# Patient Record
Sex: Female | Born: 1960 | Race: White | Hispanic: No | Marital: Married | Smoking: Former smoker
Health system: Southern US, Community
[De-identification: ages and names within clinical notes are randomized; demographics above are authoritative.]

## PROBLEM LIST (undated history)

## (undated) DIAGNOSIS — Z992 Dependence on renal dialysis: Secondary | ICD-10-CM

## (undated) DIAGNOSIS — IMO0001 Reserved for inherently not codable concepts without codable children: Secondary | ICD-10-CM

## (undated) DIAGNOSIS — C801 Malignant (primary) neoplasm, unspecified: Secondary | ICD-10-CM

## (undated) DIAGNOSIS — D631 Anemia in chronic kidney disease: Secondary | ICD-10-CM

## (undated) DIAGNOSIS — N186 End stage renal disease: Secondary | ICD-10-CM

## (undated) DIAGNOSIS — K279 Peptic ulcer, site unspecified, unspecified as acute or chronic, without hemorrhage or perforation: Secondary | ICD-10-CM

## (undated) DIAGNOSIS — H3093 Unspecified chorioretinal inflammation, bilateral: Secondary | ICD-10-CM

## (undated) DIAGNOSIS — N189 Chronic kidney disease, unspecified: Secondary | ICD-10-CM

## (undated) DIAGNOSIS — E785 Hyperlipidemia, unspecified: Secondary | ICD-10-CM

## (undated) DIAGNOSIS — G43909 Migraine, unspecified, not intractable, without status migrainosus: Secondary | ICD-10-CM

## (undated) DIAGNOSIS — Z531 Procedure and treatment not carried out because of patient's decision for reasons of belief and group pressure: Secondary | ICD-10-CM

## (undated) DIAGNOSIS — E119 Type 2 diabetes mellitus without complications: Secondary | ICD-10-CM

## (undated) DIAGNOSIS — I209 Angina pectoris, unspecified: Secondary | ICD-10-CM

## (undated) DIAGNOSIS — G629 Polyneuropathy, unspecified: Secondary | ICD-10-CM

## (undated) DIAGNOSIS — E139 Other specified diabetes mellitus without complications: Secondary | ICD-10-CM

## (undated) DIAGNOSIS — Z7902 Long term (current) use of antithrombotics/antiplatelets: Secondary | ICD-10-CM

## (undated) DIAGNOSIS — F32A Depression, unspecified: Secondary | ICD-10-CM

## (undated) DIAGNOSIS — K219 Gastro-esophageal reflux disease without esophagitis: Secondary | ICD-10-CM

## (undated) DIAGNOSIS — B958 Unspecified staphylococcus as the cause of diseases classified elsewhere: Secondary | ICD-10-CM

## (undated) DIAGNOSIS — H4042X3 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, left eye, severe stage: Secondary | ICD-10-CM

## (undated) DIAGNOSIS — E11610 Type 2 diabetes mellitus with diabetic neuropathic arthropathy: Secondary | ICD-10-CM

## (undated) DIAGNOSIS — I10 Essential (primary) hypertension: Secondary | ICD-10-CM

## (undated) HISTORY — PX: COLONOSCOPY W/ POLYPECTOMY: SHX1380

## (undated) HISTORY — DX: Migraine, unspecified, not intractable, without status migrainosus: G43.909

## (undated) HISTORY — PX: ABDOMINAL HYSTERECTOMY: SHX81

## (undated) HISTORY — PX: HEMORRHOID SURGERY: SHX153

---

## 2007-12-13 ENCOUNTER — Ambulatory Visit: Payer: Self-pay | Admitting: Unknown Physician Specialty

## 2010-10-09 ENCOUNTER — Encounter: Payer: Self-pay | Admitting: Family Medicine

## 2010-10-09 ENCOUNTER — Ambulatory Visit: Payer: BC Managed Care – PPO | Admitting: Family Medicine

## 2010-10-09 DIAGNOSIS — Z23 Encounter for immunization: Secondary | ICD-10-CM

## 2010-10-16 NOTE — Assessment & Plan Note (Signed)
Summary: FLU SHOT/EVM  Nurse Visit   Immunizations Administered:  Influenza Vaccine # 1:    Vaccine Type: Fluzone    Site: right deltoid    Mfr: Sanofi Pasteur    Dose: 0.5 ml    Route: IM    Given by: Irwin Brakeman MD    Exp. Date: 02/22/2011    Lot #: [id number]    VIS given: 03/19/10 version given October 09, 2010.  Flu Vaccine Consent Questions:    Do you have a history of severe allergic reactions to this vaccine? no    Any prior history of allergic reactions to egg and/or gelatin? no    Do you have a sensitivity to the preservative Thimersol? no    Do you have a past history of Guillan-Barre Syndrome? no    Do you currently have an acute febrile illness? no    Have you ever had a severe reaction to latex? no    Vaccine information given and explained to patient? yes    Are you currently pregnant? no

## 2011-04-24 ENCOUNTER — Ambulatory Visit: Payer: Self-pay | Admitting: Unknown Physician Specialty

## 2011-08-10 ENCOUNTER — Ambulatory Visit: Payer: Self-pay | Admitting: Internal Medicine

## 2012-06-27 ENCOUNTER — Ambulatory Visit: Payer: Self-pay | Admitting: Internal Medicine

## 2012-07-09 DIAGNOSIS — M2040 Other hammer toe(s) (acquired), unspecified foot: Secondary | ICD-10-CM | POA: Insufficient documentation

## 2012-07-09 DIAGNOSIS — L84 Corns and callosities: Secondary | ICD-10-CM | POA: Insufficient documentation

## 2012-10-13 ENCOUNTER — Ambulatory Visit: Payer: Self-pay | Admitting: Obstetrics & Gynecology

## 2012-12-30 DIAGNOSIS — E559 Vitamin D deficiency, unspecified: Secondary | ICD-10-CM | POA: Insufficient documentation

## 2013-03-09 DIAGNOSIS — H3093 Unspecified chorioretinal inflammation, bilateral: Secondary | ICD-10-CM | POA: Insufficient documentation

## 2013-10-03 DIAGNOSIS — E785 Hyperlipidemia, unspecified: Secondary | ICD-10-CM | POA: Insufficient documentation

## 2013-10-06 LAB — HM COLONOSCOPY

## 2014-02-06 DIAGNOSIS — F329 Major depressive disorder, single episode, unspecified: Secondary | ICD-10-CM | POA: Insufficient documentation

## 2014-02-06 DIAGNOSIS — F32A Depression, unspecified: Secondary | ICD-10-CM | POA: Insufficient documentation

## 2014-05-10 DIAGNOSIS — H6980 Other specified disorders of Eustachian tube, unspecified ear: Secondary | ICD-10-CM | POA: Insufficient documentation

## 2014-06-28 DIAGNOSIS — E1042 Type 1 diabetes mellitus with diabetic polyneuropathy: Secondary | ICD-10-CM | POA: Insufficient documentation

## 2014-11-02 DIAGNOSIS — M26629 Arthralgia of temporomandibular joint, unspecified side: Secondary | ICD-10-CM | POA: Insufficient documentation

## 2014-11-02 DIAGNOSIS — F331 Major depressive disorder, recurrent, moderate: Secondary | ICD-10-CM | POA: Insufficient documentation

## 2015-05-30 ENCOUNTER — Other Ambulatory Visit: Payer: Self-pay | Admitting: Obstetrics and Gynecology

## 2015-05-30 DIAGNOSIS — R928 Other abnormal and inconclusive findings on diagnostic imaging of breast: Secondary | ICD-10-CM

## 2015-06-05 ENCOUNTER — Inpatient Hospital Stay
Admission: RE | Admit: 2015-06-05 | Discharge: 2015-06-05 | Disposition: A | Discharge status: Home / Self Care | Payer: Self-pay | Source: Ambulatory Visit | Attending: *Deleted | Admitting: *Deleted

## 2015-06-05 ENCOUNTER — Other Ambulatory Visit: Payer: Self-pay | Admitting: *Deleted

## 2015-06-05 DIAGNOSIS — Z9289 Personal history of other medical treatment: Secondary | ICD-10-CM

## 2015-06-06 ENCOUNTER — Ambulatory Visit
Admission: RE | Admit: 2015-06-06 | Discharge: 2015-06-06 | Disposition: A | Discharge status: Home / Self Care | Payer: BLUE CROSS/BLUE SHIELD | Source: Ambulatory Visit | Attending: Obstetrics and Gynecology | Admitting: Obstetrics and Gynecology

## 2015-06-06 ENCOUNTER — Other Ambulatory Visit: Payer: Self-pay | Admitting: Obstetrics and Gynecology

## 2015-06-06 DIAGNOSIS — R928 Other abnormal and inconclusive findings on diagnostic imaging of breast: Secondary | ICD-10-CM

## 2015-06-06 DIAGNOSIS — N63 Unspecified lump in breast: Secondary | ICD-10-CM | POA: Insufficient documentation

## 2015-09-03 ENCOUNTER — Ambulatory Visit
Admission: EM | Admit: 2015-09-03 | Discharge: 2015-09-03 | Disposition: A | Discharge status: Home / Self Care | Payer: BLUE CROSS/BLUE SHIELD | Attending: Family Medicine | Admitting: Family Medicine

## 2015-09-03 DIAGNOSIS — L03115 Cellulitis of right lower limb: Secondary | ICD-10-CM

## 2015-09-03 HISTORY — DX: Type 2 diabetes mellitus without complications: E11.9

## 2015-09-03 HISTORY — DX: Unspecified staphylococcus as the cause of diseases classified elsewhere: B95.8

## 2015-09-03 MED ORDER — CEFAZOLIN SODIUM 1 G IJ SOLR
1.0000 g | Freq: Once | INTRAMUSCULAR | Status: AC
  Administered 2015-09-03: 1 g via INTRAMUSCULAR

## 2015-09-03 MED ORDER — SULFAMETHOXAZOLE-TRIMETHOPRIM 800-160 MG PO TABS: 1.0000 | ORAL_TABLET | Freq: Two times a day (BID) | ORAL | Status: DC

## 2015-09-03 NOTE — ED Provider Notes (Signed)
CSN: [id number]     Arrival date & time 09/03/15  [id number] History   First MD Initiated Contact with Patient 09/03/15 0913     Chief Complaint  Patient presents with  . Rash   (Consider location/radiation/quality/duration/timing/severity/associated sxs/prior Treatment) HPI Comments: 55 yo diabetic female with a 2 week h/o skin lesion that started to heal but now with 3-4 days h/o new skin lesion near the old one on the right shin area. States redness and tenderness seems to be worsening.Denies any fevers, chills.   Patient is a 55 y.o. female presenting with rash. The history is provided by the patient.  Rash   Past Medical History  Diagnosis Date  . Diabetes mellitus without complication ([place])   . Staph infection    Past Surgical History  Procedure Laterality Date  . Hemorrhoid surgery    . Colonoscopy w/ polypectomy    . Abdominal hysterectomy     Family History  Problem Relation Age of Onset  . Heart failure Mother   . Diabetes Mother    Social History  Substance Use Topics  . Smoking status: Former Research scientist (life sciences)  . Smokeless tobacco: None  . Alcohol Use: No   OB History    No data available     Review of Systems  Skin: Positive for rash.    Allergies  Penicillins  Home Medications   Prior to Admission medications   Medication Sig Start Date End Date Taking? Authorizing Provider  doxycycline (VIBRAMYCIN) 100 MG capsule Take 100 mg by mouth 2 (two) times daily.   Yes Historical Provider, MD  lisinopril (PRINIVIL,ZESTRIL) 2.5 MG tablet Take 2.5 mg by mouth daily.   Yes Historical Provider, MD  metFORMIN (GLUCOPHAGE) 1000 MG tablet Take 1,000 mg by mouth 2 (two) times daily with a meal.   Yes Historical Provider, MD  progesterone (PROMETRIUM) 100 MG capsule Take 100 mg by mouth daily.   Yes Historical Provider, MD  sertraline (ZOLOFT) 50 MG tablet Take 50 mg by mouth daily.   Yes Historical Provider, MD  sulfamethoxazole-trimethoprim (BACTRIM DS,SEPTRA DS) 800-160 MG  tablet Take 1 tablet by mouth 2 (two) times daily. 09/03/15   Norval Gable, MD   Meds Ordered and Administered this Visit   Medications  ceFAZolin (ANCEF) injection 1 g (1 g Intramuscular Given 09/03/15 0942)    BP 150/78 mmHg  Pulse 94  Temp(Src) 97.9 F (36.6 C) (Tympanic)  Resp 16  Ht 5\' 7"  (1.702 m)  Wt 155 lb (70.308 kg)  BMI 24.27 kg/m2  SpO2 100% No data found.   Physical Exam  Constitutional: She appears well-developed and well-nourished. No distress.  Skin: No rash noted. She is not diaphoretic. There is erythema.  Right lower extremity with 4x6 skin area of warmth, tenderness and blanchable erythema along with scabbed 2cm central area, on anterior shin; extremity neurovascularly intact; normal range of motion; no foot ulceration or skin lesions  Nursing note and vitals reviewed.   ED Course  Procedures (including critical care time)  Labs Review Labs Reviewed - No data to display  Imaging Review No results found.   Visual Acuity Review  Right Eye Distance:   Left Eye Distance:   Bilateral Distance:    Right Eye Near:   Left Eye Near:    Bilateral Near:         MDM   1. Cellulitis of right lower extremity    Discharge Medication List as of 09/03/2015  9:43 AM    START taking these  medications   Details  sulfamethoxazole-trimethoprim (BACTRIM DS,SEPTRA DS) 800-160 MG tablet Take 1 tablet by mouth 2 (two) times daily., Starting 09/03/2015, Until Discontinued, Normal       1. Labs/x-ray results and diagnosis reviewed with patient/parent/guardian/family 2. rx as per orders above; reviewed possible side effects, interactions, risks and benefits  3. Patient given Ancef 1gm IM x1 4. Recommend supportive treatment with rest, elevation and warm compresses to affected area 5. Follow-up with PCP (or here) in 2-3 days or sooner prn if symptoms worsen or are not improving    Norval Gable, MD 09/03/15 1025

## 2015-09-03 NOTE — ED Notes (Addendum)
States started 1-2 weeks ago with "bump" right outer lower leg. Dime sized necrotic center with redness around the wound. Also several smaller lesions bilateral arms. Been being treated by Dr. Marlane Hatcher for staph x 2 months

## 2015-09-03 NOTE — Discharge Instructions (Signed)

## 2015-09-06 ENCOUNTER — Encounter: Payer: Self-pay | Admitting: *Deleted

## 2015-09-06 ENCOUNTER — Telehealth: Payer: Self-pay | Admitting: Emergency Medicine

## 2015-09-06 ENCOUNTER — Emergency Department
Admission: EM | Admit: 2015-09-06 | Discharge: 2015-09-06 | Discharge status: Against Medical Advice | Payer: BLUE CROSS/BLUE SHIELD | Attending: Emergency Medicine | Admitting: Emergency Medicine

## 2015-09-06 DIAGNOSIS — E119 Type 2 diabetes mellitus without complications: Secondary | ICD-10-CM | POA: Insufficient documentation

## 2015-09-06 DIAGNOSIS — Z87891 Personal history of nicotine dependence: Secondary | ICD-10-CM | POA: Insufficient documentation

## 2015-09-06 DIAGNOSIS — L02415 Cutaneous abscess of right lower limb: Secondary | ICD-10-CM | POA: Insufficient documentation

## 2015-09-06 LAB — CBC WITH DIFFERENTIAL/PLATELET
Basophils Absolute: 0 10*3/uL (ref 0–0.1)
Basophils Relative: 1 %
EOS PCT: 2 %
Eosinophils Absolute: 0.1 10*3/uL (ref 0–0.7)
HCT: 37.7 % (ref 35.0–47.0)
Hemoglobin: 12.9 g/dL (ref 12.0–16.0)
LYMPHS ABS: 1.9 10*3/uL (ref 1.0–3.6)
LYMPHS PCT: 27 %
MCH: 28.8 pg (ref 26.0–34.0)
MCHC: 34.2 g/dL (ref 32.0–36.0)
MCV: 84.4 fL (ref 80.0–100.0)
MONO ABS: 0.6 10*3/uL (ref 0.2–0.9)
Monocytes Relative: 9 %
NEUTROS ABS: 4.4 10*3/uL (ref 1.4–6.5)
Neutrophils Relative %: 61 %
PLATELETS: 289 10*3/uL (ref 150–440)
RBC: 4.47 MIL/uL (ref 3.80–5.20)
RDW: 13 % (ref 11.5–14.5)
WBC: 7.1 10*3/uL (ref 3.6–11.0)

## 2015-09-06 LAB — BASIC METABOLIC PANEL
Anion gap: 6 (ref 5–15)
BUN: 24 mg/dL — AB (ref 6–20)
CALCIUM: 9.4 mg/dL (ref 8.9–10.3)
CO2: 29 mmol/L (ref 22–32)
Chloride: 102 mmol/L (ref 101–111)
Creatinine, Ser: 0.81 mg/dL (ref 0.44–1.00)
GFR calc Af Amer: >60 mL/min (ref >60)
GLUCOSE: 150 mg/dL — AB (ref 65–99)
POTASSIUM: 4.1 mmol/L (ref 3.5–5.1)
Sodium: 137 mmol/L (ref 135–145)

## 2015-09-06 NOTE — ED Notes (Signed)
Called patient due to lwot to inquire about condition and follow up plans. Left message with my number. 

## 2015-09-06 NOTE — ED Notes (Signed)
Pt has an abscess to right lower leg.  Pt is diabetic.  Area red and swollen.  No drainage.  Pt was seen at [facility] and started on septra and ancef im.  Pt states abscess not any better.

## 2015-09-20 ENCOUNTER — Encounter: Payer: BLUE CROSS/BLUE SHIELD | Attending: Surgery | Admitting: Surgery

## 2015-09-20 DIAGNOSIS — E11622 Type 2 diabetes mellitus with other skin ulcer: Secondary | ICD-10-CM | POA: Insufficient documentation

## 2015-09-20 DIAGNOSIS — Z79899 Other long term (current) drug therapy: Secondary | ICD-10-CM | POA: Diagnosis not present

## 2015-09-20 DIAGNOSIS — Z794 Long term (current) use of insulin: Secondary | ICD-10-CM | POA: Insufficient documentation

## 2015-09-20 DIAGNOSIS — Z880 Allergy status to penicillin: Secondary | ICD-10-CM | POA: Insufficient documentation

## 2015-09-20 DIAGNOSIS — Z7984 Long term (current) use of oral hypoglycemic drugs: Secondary | ICD-10-CM | POA: Insufficient documentation

## 2015-09-20 DIAGNOSIS — L97212 Non-pressure chronic ulcer of right calf with fat layer exposed: Secondary | ICD-10-CM | POA: Diagnosis not present

## 2015-09-20 DIAGNOSIS — E1140 Type 2 diabetes mellitus with diabetic neuropathy, unspecified: Secondary | ICD-10-CM | POA: Diagnosis not present

## 2015-09-20 DIAGNOSIS — R05 Cough: Secondary | ICD-10-CM | POA: Diagnosis not present

## 2015-09-20 NOTE — Progress Notes (Addendum)
AUNA, VEREEN ([id number]) Visit Report for 09/20/2015 Chief Complaint Document Details Patient Name: Kelli [name], Kelli [name] 09/20/2015 9:30 Date of Service: AM Medical Record [id number] Number: Patient Account Number: [id number] Nov 29, 1960 (55 y.o. Treating RN: Macarthur Critchley Date of Birth/Sex: Female) Other Clinician: Primary Care Physician: Treating Christin Fudge Referring Physician: Orvilla Fus Physician/Extender: Weeks in Treatment: 0 Information Obtained from: Patient Chief Complaint Patients presents for treatment of an open diabetic ulcer to the right lower leg near her cough for about 3 months Electronic Signature(s) Signed: 09/20/2015 11:45:12 AM By: Christin Fudge MD, FACS Entered By: Christin Fudge on 09/20/2015 11:45:12 Kelli [name] ([id number]) -------------------------------------------------------------------------------- Debridement Details Patient Name: Kelli [name] 09/20/2015 9:30 Date of Service: AM Medical Record [id number] Number: Patient Account Number: [id number] [birth year] (55 y.o. Treating RN: Macarthur Critchley Date of Birth/Sex: Female) Other Clinician: Primary Care Physician: Treating Catilyn Boggus Referring Physician: Orvilla Fus Physician/Extender: Weeks in Treatment: 0 Debridement Performed for Wound #1 Right,Lateral Lower Leg Assessment: Performed By: Physician Christin Fudge, MD Debridement: Debridement Pre-procedure Yes Verification/Time Out Taken: Start Time: 10:32 Pain Control: Lidocaine 4% Topical Solution Level: Skin/Subcutaneous Tissue Total Area Debrided (L x 1.5 (cm) x 1.5 (cm) = 2.25 (cm) W): Tissue and other Viable, Non-Viable, Fibrin/Slough, Subcutaneous material debrided: Instrument: Curette Bleeding: Minimum Hemostasis Achieved: Pressure End Time: 10:35 Procedural Pain: 0 Post Procedural Pain: 0 Response to Treatment: Procedure was tolerated well Post Debridement Measurements of Total Wound Length:  (cm) 1.5 Width: (cm) 1.5 Depth: (cm) 0.2 Volume: (cm) 0.353 Post Procedure Diagnosis Same as Pre-procedure Electronic Signature(s) Signed: 09/20/2015 11:44:51 AM By: Christin Fudge MD, FACS Signed: 09/20/2015 4:29:37 PM By: Rebecca Eaton RN, Sendra Entered By: Christin Fudge on 09/20/2015 11:44:51 Kelli [name] ([id number]) -------------------------------------------------------------------------------- HPI Details Patient Name: Kelli [name] 09/20/2015 9:30 Date of Service: AM Medical Record [id number] Number: Patient Account Number: [id number] [birth year] (55 y.o. Treating RN: Macarthur Critchley Date of Birth/Sex: Female) Other Clinician: Primary Care Physician: Treating Christin Fudge Referring Physician: Orvilla Fus Physician/Extender: Weeks in Treatment: 0 History of Present Illness Location: right lateral calf region Quality: Patient reports No Pain. Severity: Patient states wound are getting worse. Duration: Patient has had the wound for > 3 months prior to seeking treatment at the wound center Context: The wound appeared gradually over time Modifying Factors: Other treatment(s) tried include:she has seen a dermatologist and her PCP and has had 3 rounds of antibiotics in the last 3 months Associated Signs and Symptoms: Patient reports having increase swelling. HPI Description: 55 year old patient referred to as by her PCP Dr. Orvilla Fus, for a wound on her right lower calf which has been there for about 3 months. She has received antibiotics on 3 different occasions for this. Her past medical history is significant for diabetes mellitus type 2, depression and migraine. She has not had any surgery. She was known to be a smoker and has quit smoking 30 years ago. she was told she had a staph infection and this is possibly by a culture done by the dermatologist. I do not believe a skin biopsy has been done. Electronic Signature(s) Signed: 09/20/2015 11:47:09 AM By:  Christin Fudge MD, FACS Previous Signature: 09/20/2015 9:51:06 AM Version By: Christin Fudge MD, FACS Previous Signature: 09/20/2015 9:48:19 AM Version By: Christin Fudge MD, FACS Entered By: Christin Fudge on 09/20/2015 11:47:08 Kelli [name] ([id number]) -------------------------------------------------------------------------------- Physical Exam Details Patient Name: Kelli [name] 09/20/2015 9:30 Date of Service: AM Medical Record [id number] Number: Patient Account Number: [id number] Sep 28, 1960 (55 y.o. Treating RN: Rebecca Eaton,  Roslynn Amble Date of Birth/Sex: Female) Other Clinician: Primary Care Physician: Treating Christin Fudge Referring Physician: Orvilla Fus Physician/Extender: Weeks in Treatment: 0 Constitutional . Pulse regular. Respirations normal and unlabored. Afebrile. . Eyes Nonicteric. Reactive to light. Ears, Nose, Mouth, and Throat Lips, teeth, and gums WNL.Marland Kitchen Moist mucosa without lesions. Neck supple and nontender. No palpable supraclavicular or cervical adenopathy. Normal sized without goiter. Respiratory WNL. No retractions.. Cardiovascular Pedal Pulses WNL. ABI on the right is 1.05 and the left is 1.09. mild +1 pitting edema right lower extremity. Gastrointestinal (GI) Abdomen without masses or tenderness.. No liver or spleen enlargement or tenderness.. Lymphatic No adneopathy. No adenopathy. No adenopathy. Musculoskeletal Adexa without tenderness or enlargement.. Digits and nails w/o clubbing, cyanosis, infection, petechiae, ischemia, or inflammatory conditions.. Integumentary (Hair, Skin) No suspicious lesions. No crepitus or fluctuance. No peri-wound warmth or erythema. No masses.Marland Kitchen Psychiatric Judgement and insight Intact.. No evidence of depression, anxiety, or agitation.. Notes she has a circular ulceration on the right mid lateral calf which has necrotic debris and sharp dissection was done with a curette up to the subcutaneous tissue. The  surrounding skin shows lichenification possibly due to using abrasive cleaning agents Electronic Signature(s) Signed: 09/20/2015 11:48:39 AM By: Christin Fudge MD, FACS Entered By: Christin Fudge on 09/20/2015 11:48:38 Kelli [name] ([id number]) -------------------------------------------------------------------------------- Physician Orders Details Patient Name: DESRA, ELICH 09/20/2015 9:30 Date of Service: AM Medical Record [id number] Number: Patient Account Number: [id number] 02/13/1961 (55 y.o. Treating RN: Macarthur Critchley Date of Birth/Sex: Female) Other Clinician: Primary Care Physician: Treating Christin Fudge Referring Physician: Orvilla Fus Physician/Extender: Weeks in Treatment: 0 Verbal / Phone Orders: Yes Clinician: Macarthur Critchley Read Back and Verified: No Diagnosis Coding Wound Cleansing Wound #1 Right,Lateral Lower Leg o Cleanse wound with mild soap and water Primary Wound Dressing Wound #1 Right,Lateral Lower Leg o Santyl Ointment Secondary Dressing Wound #1 Right,Lateral Lower Leg o Gauze and Kerlix/Conform Dressing Change Frequency Wound #1 Right,Lateral Lower Leg o Change dressing every day. Follow-up Appointments Wound #1 Right,Lateral Lower Leg o Return Appointment in 1 week. Electronic Signature(s) Signed: 09/20/2015 4:27:41 PM By: Christin Fudge MD, FACS Signed: 09/20/2015 4:29:37 PM By: Rebecca Eaton RN, Romero Liner By: Rebecca Eaton RN, Sendra on 09/20/2015 10:37:36 LAURNA, PLAZA ([id number]) -------------------------------------------------------------------------------- Prescription 09/20/2015 Patient Name: Kelli [name] Physician: Christin Fudge MD Date of Birth: [birth year] NPI#: [id number] Sex: F DEA#: [id number] Phone #: [phone] License #: Patient Address: [address], [place] [address] [facility] [address] [place], [place] [address] [phone] Allergies penicillin Reaction: nausea, vomiting Physician's Orders Santyl Ointment Signature(s): Date(s): Electronic Signature(s) Signed: 09/20/2015 4:27:41 PM By: Christin Fudge MD, FACS Signed: 09/20/2015 4:29:37 PM By: Rebecca Eaton RN, Sendra Entered By: Rebecca Eaton RN, Sendra on 09/20/2015 10:37:36 Kelli [name] ([id number]) --------------------------------------------------------------------------------  Problem List Details Patient Name: SHANTORIA, DELMONICO 09/20/2015 9:30 Date of Service: AM Medical Record [id number] Number: Patient Account Number: [id number] 10-13-1960 (55 y.o. Treating RN: Macarthur Critchley Date of Birth/Sex: Female) Other Clinician: Primary Care Physician: Treating Christin Fudge Referring Physician: Orvilla Fus Physician/Extender: Weeks in Treatment: 0 Active Problems ICD-10 Encounter Code Description Active Date Diagnosis E11.622 Type 2 diabetes mellitus with other skin ulcer 09/20/2015 Yes L97.212 Non-pressure chronic ulcer of right calf with fat layer 09/20/2015 Yes exposed Inactive Problems Resolved Problems Electronic Signature(s) Signed: 09/20/2015 11:44:36 AM By: Christin Fudge MD, FACS Entered By: Christin Fudge on 09/20/2015 11:44:36 Kelli [name] ([id number]) -------------------------------------------------------------------------------- Progress Note Details Patient Name: Lynwood Dawley  G. 09/20/2015 9:30 Date of Service: AM Medical Record [id number] Number: Patient Account Number: [id number] 03-Jun-1961 (54 y.o. Treating RN: Macarthur Critchley Date of Birth/Sex: Female) Other Clinician: Primary Care Physician: Treating Christin Fudge Referring Physician: Orvilla Fus Physician/Extender: Weeks in Treatment: 0 Subjective Chief Complaint Information obtained from Patient Patients presents for treatment of an open diabetic ulcer to the right lower leg near her cough for about  3 months History of Present Illness (HPI) The following HPI elements were documented for the patient's wound: Location: right lateral calf region Quality: Patient reports No Pain. Severity: Patient states wound are getting worse. Duration: Patient has had the wound for > 3 months prior to seeking treatment at the wound center Context: The wound appeared gradually over time Modifying Factors: Other treatment(s) tried include:she has seen a dermatologist and her PCP and has had 3 rounds of antibiotics in the last 3 months Associated Signs and Symptoms: Patient reports having increase swelling. 55 year old patient referred to as by her PCP Dr. Orvilla Fus, for a wound on her right lower calf which has been there for about 3 months. She has received antibiotics on 3 different occasions for this. Her past medical history is significant for diabetes mellitus type 2, depression and migraine. She has not had any surgery. She was known to be a smoker and has quit smoking 30 years ago. she was told she had a staph infection and this is possibly by a culture done by the dermatologist. I do not believe a skin biopsy has been done. Wound History Patient presents with 1 open wound that has been present for approximately few months. Patient has been treating wound in the following manner: antibiotics, ointment. Laboratory tests have not been performed in the last month. Patient reportedly has not tested positive for an antibiotic resistant organism. Patient reportedly has not tested positive for osteomyelitis. Patient reportedly has had testing performed to evaluate circulation in the legs. Patient experiences the following problems associated with their wounds: staph infection. Patient History Information obtained from Patient. Allergies CASONDRA, VANDERHYDE ([id number]) penicillin (Reaction: nausea, vomiting) Family History Diabetes - Mother, Father, Maternal Grandparents, Heart Disease - Mother,  Tuberculosis - Father, No family history of Cancer, Hypertension, Kidney Disease, Lung Disease, Seizures, Stroke, Thyroid Problems. Social History Former smoker - 30 years ago quit, Marital Status - Married, Alcohol Use - Never, Drug Use - No History, Caffeine Use - Daily. Medical History Eyes Denies history of Cataracts, Glaucoma, Optic Neuritis Ear/Nose/Mouth/Throat Denies history of Chronic sinus problems/congestion, Middle ear problems Hematologic/Lymphatic Denies history of Anemia, Hemophilia, Human Immunodeficiency Virus, Lymphedema, Sickle Cell Disease Respiratory Denies history of Aspiration, Asthma, Chronic Obstructive Pulmonary Disease (COPD), Pneumothorax, Sleep Apnea, Tuberculosis Cardiovascular Denies history of Angina, Arrhythmia, Congestive Heart Failure, Coronary Artery Disease, Deep Vein Thrombosis, Hypertension, Hypotension, Myocardial Infarction, Peripheral Arterial Disease, Peripheral Venous Disease, Phlebitis, Vasculitis Endocrine Patient has history of Type II Diabetes Genitourinary Denies history of End Stage Renal Disease Immunological Denies history of Lupus Erythematosus, Raynaud s, Scleroderma Integumentary (Skin) Denies history of History of Burn, History of pressure wounds Musculoskeletal Denies history of Gout, Rheumatoid Arthritis, Osteoarthritis, Osteomyelitis Neurologic Patient has history of Neuropathy Denies history of Dementia, Quadriplegia, Paraplegia, Seizure Disorder Oncologic Denies history of Received Chemotherapy, Received Radiation Psychiatric Denies history of Anorexia/bulimia, Confinement Anxiety Patient is treated with Insulin, Oral Agents. Blood sugar is tested. Medical And Surgical History Notes Eyes laser surgery, retina surgery Endocrine Hemoglobin A1C 11 Seago, Ruthmary G. ([id number]) Review of Systems (ROS) Constitutional Symptoms (General Health)  overall doesn't feel good Ear/Nose/Mouth/Throat Denies complaints or  symptoms of Difficult clearing ears, Sinusitis. Hematologic/Lymphatic Denies complaints or symptoms of Bleeding / Clotting Disorders, Human Immunodeficiency Virus. Respiratory Complains or has symptoms of Chronic or frequent coughs. Denies complaints or symptoms of Shortness of Breath. Cardiovascular Denies complaints or symptoms of Chest pain, LE edema. Gastrointestinal Complains or has symptoms of Nausea. Denies complaints or symptoms of Frequent diarrhea, Vomiting. Endocrine Denies complaints or symptoms of Hepatitis, Thyroid disease, Polydypsia (Excessive Thirst). Immunological Denies complaints or symptoms of Hives, Itching. Integumentary (Skin) Complains or has symptoms of Wounds - 1 wound. Denies complaints or symptoms of Bleeding or bruising tendency, Breakdown, Swelling. Musculoskeletal Denies complaints or symptoms of Muscle Pain, Muscle Weakness. Neurologic Denies complaints or symptoms of Numbness/parasthesias, Focal/Weakness. Oncologic The patient has no complaints or symptoms. Psychiatric Complains or has symptoms of Anxiety. Medications Adult Low Dose Aspirin 81 mg tablet,delayed release oral tablet,delayed release (DR/EC) oral metformin 500 mg tablet oral twice a day tablet oral Fluarix Quad 2016-2017 (PF) 60 mcg (15 mcg x 4)/0.5 mL IM syringe intramuscular syringe intramuscular Novolog Flexpen 100 unit/mL subcutaneous subcutaneous 15u three times a day insulin pen subcutaneous fluticasone 50 mcg/actuation nasal spray,suspension nasal spray,suspension nasal Zoloft 100 mg tablet oral tablet oral mupirocin 2 % topical ointment topical ointment topical Estrace 0.01% (0.1 mg/gram) vaginal cream vaginal cream vaginal Fennimore, Adayah G. ([id number]) Objective Constitutional Pulse regular. Respirations normal and unlabored. Afebrile. Vitals Time Taken: 9:57 AM, Height: 67 in, Source: Stated, Weight: 160 lbs, Source: Stated, BMI: 25.1, Temperature: 97.5 F, Pulse: 88  bpm, Blood Pressure: 107/59 mmHg. Eyes Nonicteric. Reactive to light. Ears, Nose, Mouth, and Throat Lips, teeth, and gums WNL.Marland Kitchen Moist mucosa without lesions. Neck supple and nontender. No palpable supraclavicular or cervical adenopathy. Normal sized without goiter. Respiratory WNL. No retractions.. Cardiovascular Pedal Pulses WNL. ABI on the right is 1.05 and the left is 1.09. mild +1 pitting edema right lower extremity. Gastrointestinal (GI) Abdomen without masses or tenderness.. No liver or spleen enlargement or tenderness.. Lymphatic No adneopathy. No adenopathy. No adenopathy. Musculoskeletal Adexa without tenderness or enlargement.. Digits and nails w/o clubbing, cyanosis, infection, petechiae, ischemia, or inflammatory conditions.Marland Kitchen Psychiatric Judgement and insight Intact.. No evidence of depression, anxiety, or agitation.. General Notes: she has a circular ulceration on the right mid lateral calf which has necrotic debris and sharp dissection was done with a curette up to the subcutaneous tissue. The surrounding skin shows lichenification possibly due to using abrasive cleaning agents Integumentary (Hair, Skin) No suspicious lesions. No crepitus or fluctuance. No peri-wound warmth or erythema. No masses.. Wound #1 status is Open. Original cause of wound was Gradually Appeared. The wound is located on the Right,Lateral Lower Leg. The wound measures 1.5cm length x 1.5cm width x 0.1cm depth; 1.767cm^2 area and 0.177cm^3 volume. The wound is limited to skin breakdown. There is no tunneling or undermining noted. There is a small amount of drainage noted. The wound margin is flat and intact. There is small Crehan, Elyn G. ([id number]) (1-33%) red granulation within the wound bed. There is a large (67-100%) amount of necrotic tissue within the wound bed including Adherent Slough. The periwound skin appearance exhibited: Dry/Scaly. The periwound skin appearance did not exhibit: Callus,  Crepitus, Excoriation, Fluctuance, Friable, Induration, Localized Edema, Rash, Scarring, Maceration, Moist, Atrophie Blanche, Cyanosis, Ecchymosis, Hemosiderin Staining, Mottled, Pallor, Rubor, Erythema. Assessment Active Problems ICD-10 E11.622 - Type 2 diabetes mellitus with other skin ulcer L97.212 - Non-pressure chronic ulcer of right calf with fat layer exposed  55 year old diabetic with a chronic ulcer on the right lateral calf for 3 months has been treated with antibiotics and local care and has had this chronic wound growing worse. After debriding I have recommended Santyl ointment to be applied on a daily basis and have discussed with the possibility of a skin biopsy at a later stage if this does not improve appropriately. She will also need a venous duplex study if the problem persist. All her questions have been answered and she will see me back next week Procedures Wound #1 Wound #1 is a To be determined located on the Right,Lateral Lower Leg . There was a Skin/Subcutaneous Tissue Debridement [id number]) debridement with total area of 2.25 sq cm performed by Christin Fudge, MD. with the following instrument(s): Curette to remove Viable and Non-Viable tissue/material including Fibrin/Slough and Subcutaneous after achieving pain control using Lidocaine 4% Topical Solution. A time out was conducted prior to the start of the procedure. A Minimum amount of bleeding was controlled with Pressure. The procedure was tolerated well with a pain level of 0 throughout and a pain level of 0 following the procedure. Post Debridement Measurements: 1.5cm length x 1.5cm width x 0.2cm depth; 0.353cm^3 volume. Post procedure Diagnosis Wound #1: Same as Pre-Procedure LAJUANDA, YAFFE. ([id number]) Plan Wound Cleansing: Wound #1 Right,Lateral Lower Leg: Cleanse wound with mild soap and water Primary Wound Dressing: Wound #1 Right,Lateral Lower Leg: Santyl Ointment Secondary Dressing: Wound #1  Right,Lateral Lower Leg: Gauze and Kerlix/Conform Dressing Change Frequency: Wound #1 Right,Lateral Lower Leg: Change dressing every day. Follow-up Appointments: Wound #1 Right,Lateral Lower Leg: Return Appointment in 1 week. 55 year old diabetic with a chronic ulcer on the right lateral calf for 3 months has been treated with antibiotics and local care and has had this chronic wound growing worse. After debriding I have recommended Santyl ointment to be applied on a daily basis and have discussed with the possibility of a skin biopsy at a later stage if this does not improve appropriately. She will also need a venous duplex study if the problem persist. All her questions have been answered and she will see me back next week Electronic Signature(s) Signed: 09/21/2015 10:46:03 AM By: Christin Fudge MD, FACS Previous Signature: 09/20/2015 11:50:37 AM Version By: Christin Fudge MD, FACS Entered By: Christin Fudge on 09/21/2015 10:46:03 Kelli [name] ([id number]) -------------------------------------------------------------------------------- ROS/PFSH Details Patient Name: SERENE, BASTOW 09/20/2015 9:30 Date of Service: AM Medical Record [id number] Number: Patient Account Number: [id number] 1961-07-15 (55 y.o. Treating RN: Macarthur Critchley Date of Birth/Sex: Female) Other Clinician: Primary Care Physician: Treating Santiago Stenzel Referring Physician: Orvilla Fus Physician/Extender: Weeks in Treatment: 0 Information Obtained From Patient Wound History Do you currently have one or more open woundso Yes How many open wounds do you currently haveo 1 Approximately how long have you had your woundso few months How have you been treating your wound(s) until nowo antibiotics, ointment Has your wound(s) ever healed and then re-openedo No Have you had any lab work done in the past montho No Have you tested positive for an antibiotic resistant organism (MRSA, VRE)o No Have you tested  positive for osteomyelitis (bone infection)o No Have you had any tests for circulation on your legso Yes Who ordered the testo 2 years ago [facility] the [facility] Have you had other problems associated with your woundso Other: staph infection Ear/Nose/Mouth/Throat Complaints and Symptoms: Negative for: Difficult clearing ears; Sinusitis Medical History: Negative for: Chronic sinus problems/congestion; Middle ear problems Hematologic/Lymphatic Complaints and  Symptoms: Negative for: Bleeding / Clotting Disorders; Human Immunodeficiency Virus Medical History: Negative for: Anemia; Hemophilia; Human Immunodeficiency Virus; Lymphedema; Sickle Cell Disease Respiratory Complaints and Symptoms: Positive for: Chronic or frequent coughs Negative for: Shortness of Breath Medical History: Negative for: Aspiration; Asthma; Chronic Obstructive Pulmonary Disease (COPD); Pneumothorax; Sleep Graveline, Tamiah G. ([id number]) Apnea; Tuberculosis Cardiovascular Complaints and Symptoms: Negative for: Chest pain; LE edema Medical History: Negative for: Angina; Arrhythmia; Congestive Heart Failure; Coronary Artery Disease; Deep Vein Thrombosis; Hypertension; Hypotension; Myocardial Infarction; Peripheral Arterial Disease; Peripheral Venous Disease; Phlebitis; Vasculitis Gastrointestinal Complaints and Symptoms: Positive for: Nausea Negative for: Frequent diarrhea; Vomiting Endocrine Complaints and Symptoms: Negative for: Hepatitis; Thyroid disease; Polydypsia (Excessive Thirst) Medical History: Positive for: Type II Diabetes Past Medical History Notes: Hemoglobin A1C 11 Time with diabetes: since 1996 Treated with: Insulin, Oral agents Blood sugar tested every day: Yes Tested : 3x day Immunological Complaints and Symptoms: Negative for: Hives; Itching Medical History: Negative for: Lupus Erythematosus; Raynaudos; Scleroderma Integumentary (Skin) Complaints and Symptoms: Positive  for: Wounds - 1 wound Negative for: Bleeding or bruising tendency; Breakdown; Swelling Medical History: Negative for: History of Burn; History of pressure wounds Musculoskeletal Drotar, Lunah G. ([id number]) Complaints and Symptoms: Negative for: Muscle Pain; Muscle Weakness Medical History: Negative for: Gout; Rheumatoid Arthritis; Osteoarthritis; Osteomyelitis Neurologic Complaints and Symptoms: Negative for: Numbness/parasthesias; Focal/Weakness Medical History: Positive for: Neuropathy Negative for: Dementia; Quadriplegia; Paraplegia; Seizure Disorder Psychiatric Complaints and Symptoms: Positive for: Anxiety Medical History: Negative for: Anorexia/bulimia; Confinement Anxiety Constitutional Symptoms (General Health) Complaints and Symptoms: Review of System Notes: overall doesn't feel good Eyes Medical History: Negative for: Cataracts; Glaucoma; Optic Neuritis Past Medical History Notes: laser surgery, retina surgery Genitourinary Medical History: Negative for: End Stage Renal Disease Oncologic Complaints and Symptoms: No Complaints or Symptoms Medical History: Negative for: Received Chemotherapy; Received Radiation Immunizations Immunization Notes: MAYLANA, BERTOLUCCI ([id number]) up to date, shingles-flu and pneumonia vaccines current Family and Social History Cancer: No; Diabetes: Yes - Mother, Father, Maternal Grandparents; Heart Disease: Yes - Mother; Hypertension: No; Kidney Disease: No; Lung Disease: No; Seizures: No; Stroke: No; Thyroid Problems: No; Tuberculosis: Yes - Father; Former smoker - 30 years ago quit; Marital Status - Married; Alcohol Use: Never; Drug Use: No History; Caffeine Use: Daily; Financial Concerns: No; Food, Clothing or Shelter Needs: No; Support System Lacking: No; Transportation Concerns: No; Advanced Directives: No; Patient does not want information on Advanced Directives; Living Will: No; Medical Power of Attorney: No Physician  Affirmation I have reviewed and agree with the above information. Electronic Signature(s) Signed: 09/20/2015 10:24:41 AM By: Christin Fudge MD, FACS Signed: 09/20/2015 4:29:37 PM By: Rebecca Eaton RN, Sendra Entered By: Christin Fudge on 09/20/2015 10:24:39 Kelli [name] ([id number]) -------------------------------------------------------------------------------- SuperBill Details Patient Name: Kelli [name] Date of Service: 09/20/2015 Medical Record Number: [id number] Patient Account Number: [id number] Date of Birth/Sex: [birth year] (54 y.o. 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 Modifier: layer exposed Quantity: 1 Physician Procedures CPT4 Code: [id number] Description: [id number] -  WC PHYS LEVEL 4 - NEW PT ICD-10 Description Diagnosis E11.622 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

## 2015-09-21 NOTE — Progress Notes (Signed)
GINNETTE, BERARDINO ([id number]) Visit Report for 09/20/2015 Allergy List Details Patient Name: Kelli [name], Kelli [name]. Date of Service: 09/20/2015 9:30 AM Medical Record Number: [id number] Patient Account Number: [id number] Date of Birth/Sex: 1961/03/17 (55 y.o. Female) Treating RN: Macarthur Critchley Primary Care Physician: Other Clinician: Referring Physician: Orvilla Fus Treating Physician/Extender: Frann Rider in Treatment: 0 Allergies Active Allergies penicillin Reaction: nausea, vomiting Allergy Notes Electronic Signature(s) Signed: 09/20/2015 4:29:37 PM By: Rebecca Eaton RN, Sendra Entered By: Rebecca Eaton RN, Sendra on 09/20/2015 09:45:26 Kelli [name] ([id number]) -------------------------------------------------------------------------------- Arrival Information Details Patient Name: Kelli [name] Date of Service: 09/20/2015 9:30 AM Medical Record Number: [id number] Patient Account Number: [id number] Date of Birth/Sex: September 07, 1960 (55 y.o. Female) Treating RN: Macarthur Critchley Primary Care Physician: Other Clinician: Referring Physician: Orvilla Fus Treating Physician/Extender: Frann Rider in Treatment: 0 Visit Information Patient Arrived: Ambulatory Arrival Time: 09:41 Accompanied By: self Transfer Assistance: None Patient Identification Verified: Yes Secondary Verification Process Yes Completed: Patient Has Alerts: Yes Patient Alerts: 1/26 ABI (L)1.09 (R) 1.05 Electronic Signature(s) Signed: 09/20/2015 4:29:37 PM By: Rebecca Eaton, RN, Sendra Entered By: Rebecca Eaton RN, Sendra on 09/20/2015 10:09:25 Kelli [name] ([id number]) -------------------------------------------------------------------------------- Clinic Level of Care Assessment Details Patient Name: Kelli [name] Date of Service: 09/20/2015 9:30 AM Medical Record Number: [id number] Patient Account Number: [id number] Date of Birth/Sex: 1961/06/03 (55 y.o. Female) Treating RN: Macarthur Critchley Primary Care Physician: Other Clinician: Referring Physician: Orvilla Fus Treating Physician/Extender: Frann Rider in Treatment: 0 Clinic Level of Care Assessment Items TOOL 1 Quantity Score X - Use when EandM and Procedure is performed on INITIAL visit 1 0 ASSESSMENTS - Nursing Assessment / Reassessment X - General Physical Exam (combine w/ comprehensive assessment (listed just 1 20 below) when performed on new pt. evals) X - Comprehensive Assessment (HX, ROS, Risk Assessments, Wounds Hx, etc.) 1 25 ASSESSMENTS - Wound and Skin Assessment / Reassessment []  - Dermatologic / Skin Assessment (not related to wound area) 0 ASSESSMENTS - Ostomy and/or Continence Assessment and Care []  - Incontinence Assessment and Management 0 []  - Ostomy Care Assessment and Management (repouching, etc.) 0 PROCESS - Coordination of Care X - Simple Patient / Family Education for ongoing care 1 15 []  - Complex (extensive) Patient / Family Education for ongoing care 0 X - Staff obtains Programmer, systems, Records, Test Results / Process Orders 1 10 X - Staff telephones HHA, Nursing Homes / Clarify orders / etc 1 10 []  - Routine Transfer to another Facility (non-emergent condition) 0 []  - Routine Hospital Admission (non-emergent condition) 0 []  - New Admissions / Biomedical engineer / Ordering NPWT, Apligraf, etc. 0 []  - Emergency Hospital Admission (emergent condition) 0 PROCESS - Special Needs []  - Pediatric / Minor Patient Management 0 []  - Isolation Patient Management 0 [name], [name] ([id number]) []  - Hearing / Language / Visual special needs 0 []  - Assessment of Community assistance (transportation, D/C planning, etc.) 0 []  - Additional assistance / Altered mentation 0 []  - Support Surface(s) Assessment (bed, cushion, seat, etc.) 0 INTERVENTIONS - Miscellaneous []  - External ear exam 0 []  - Patient Transfer (multiple staff / Civil Service fast streamer / Similar devices) 0 []  - Simple Staple /  Suture removal (25 or less) 0 []  - Complex Staple / Suture removal (26 or more) 0 []  - Hypo/Hyperglycemic Management (do not check if billed separately) 0 X - Ankle / Brachial Index (ABI) - do not check if billed separately 1 15 Has the patient been seen at the hospital within the last three years:  Yes Total Score: 95 Level Of Care: New/Established - Level 3 Electronic Signature(s) Signed: 09/20/2015 4:29:37 PM By: Rebecca Eaton, RN, Sendra Entered By: Rebecca Eaton RN, Sendra on 09/20/2015 15:01:56 Kelli [name] ([id number]) -------------------------------------------------------------------------------- Encounter Discharge Information Details Patient Name: Kelli [name] Date of Service: 09/20/2015 9:30 AM Medical Record Number: [id number] Patient Account Number: [id number] Date of Birth/Sex: 11-Jun-1961 (55 y.o. Female) Treating RN: Macarthur Critchley Primary Care Physician: Other Clinician: Referring Physician: Orvilla Fus Treating Physician/Extender: Frann Rider in Treatment: 0 Encounter Discharge Information Items Schedule Follow-up Appointment: No Medication Reconciliation completed No and provided to Patient/Care [name]: Provided on Clinical Summary of Care: 09/20/2015 Form Type Recipient Paper Patient [facility] Electronic Signature(s) Signed: 09/20/2015 10:48:33 AM By: Ruthine Dose Entered By: Ruthine Dose on 09/20/2015 10:48:32 Kelli [name] ([id number]) -------------------------------------------------------------------------------- Lower Extremity Assessment Details Patient Name: Kelli [name] Date of Service: 09/20/2015 9:30 AM Medical Record Number: [id number] Patient Account Number: [id number] Date of Birth/Sex: [birth year] (55 y.o. Female) Treating RN: Macarthur Critchley Primary Care Physician: Other Clinician: Referring Physician: Orvilla Fus Treating Physician/Extender: Frann Rider in Treatment: 0 Edema Assessment Assessed: [Left: No]  [Right: No] Edema: [Left: Ye] [Right: s] Calf Left: Right: Point of Measurement: 32 cm From Medial Instep cm 37 cm Ankle Left: Right: Point of Measurement: 10 cm From Medial Instep cm 21 cm Vascular Assessment Pulses: Posterior Tibial Palpable: [Right:Yes] Dorsalis Pedis Palpable: [Right:Yes] Extremity colors, hair growth, and conditions: Extremity Color: [Right:Normal] Hair Growth on Extremity: [Right:Yes] Temperature of Extremity: [Right:Warm] Capillary Refill: [Right:< 3 seconds] Dependent Rubor: [Right:No] Blanched when Elevated: [Right:No] Lipodermatosclerosis: [Right:No] Blood Pressure: Brachial: [Left:110] [Right:110] Dorsalis Pedis: 120 [Left:Dorsalis Pedis: [id number] Ankle: Posterior Tibial: [Left:Posterior Tibial: 1.09] [Right:1.05] Toe Nail Assessment Left: Right: Thick: No Discolored: No Deformed: No Mccombie, [name] ([id number]) Improper Length and Hygiene: No Electronic Signature(s) Signed: 09/20/2015 4:29:37 PM By: Rebecca Eaton, RN, Sendra Entered By: Rebecca Eaton RN, Sendra on 09/20/2015 10:08:40 Kelli [name] ([id number]) -------------------------------------------------------------------------------- Multi Wound Chart Details Patient Name: Kelli [name] Date of Service: 09/20/2015 9:30 AM Medical Record Number: [id number] Patient Account Number: [id number] Date of Birth/Sex: [birth year] (55 y.o. Female) Treating RN: Macarthur Critchley Primary Care Physician: Other Clinician: Referring Physician: Orvilla Fus Treating Physician/Extender: Frann Rider in Treatment: 0 Vital Signs Height(in): 67 Pulse(bpm): 88 Weight(lbs): 160 Blood Pressure 107/59 (mmHg): Body Mass Index(BMI): 25 Temperature(F): 97.5 Respiratory Rate (breaths/min): Photos: [1:No Photos] [N/A:N/A] Wound Location: [1:Right Lower Leg - Lateral] [N/A:N/A] Wounding Event: [1:Gradually Appeared] [N/A:N/A] Primary Etiology: [1:To be determined] [N/A:N/A] Comorbid History:  [1:Type II Diabetes, Neuropathy] [N/A:N/A] Date Acquired: [1:05/26/2015] [N/A:N/A] Weeks of Treatment: [1:0] [N/A:N/A] Wound Status: [1:Open] [N/A:N/A] Measurements L x W x D 1.5x1.5x0.1 [N/A:N/A] (cm) Area (cm) : [1:1.767] [N/A:N/A] Volume (cm) : [1:0.177] [N/A:N/A] Classification: [1:Partial Thickness] [N/A:N/A] HBO Classification: [1:Grade 1] [N/A:N/A] Exudate Amount: [1:Small] [N/A:N/A] Wound Margin: [1:Flat and Intact] [N/A:N/A] Granulation Amount: [1:Small (1-33%)] [N/A:N/A] Granulation Quality: [1:Red] [N/A:N/A] Necrotic Amount: [1:Large (67-100%)] [N/A:N/A] Exposed Structures: [1:Fascia: No Fat: No Tendon: No Muscle: No Joint: No Bone: No Limited to Skin Breakdown] [N/A:N/A] Epithelialization: [1:None] [N/A:N/A] Periwound Skin Texture: [N/A:N/A] Edema: No Excoriation: No Induration: No Callus: No Crepitus: No Fluctuance: No Friable: No Rash: No Scarring: No Periwound Skin Dry/Scaly: Yes N/A N/A Moisture: Maceration: No Moist: No Periwound Skin Color: Atrophie Blanche: No N/A N/A Cyanosis: No Ecchymosis: No Erythema: No Hemosiderin Staining: No Mottled: No Pallor: No Rubor: No Tenderness on No N/A N/A Palpation: Wound Preparation: Ulcer Cleansing: N/A N/A Rinsed/Irrigated with Saline Topical Anesthetic Applied: Other: lidocaine 4% Treatment  Notes Electronic Signature(s) Signed: 09/20/2015 4:29:37 PM By: Rebecca Eaton RN, Romero Liner By: Rebecca Eaton RN, Sendra on 09/20/2015 10:13:47 Kelli [name] ([id number]) -------------------------------------------------------------------------------- [place] Details Patient Name: Kelli [name], Kelli [name] Date of Service: 09/20/2015 9:30 AM Medical Record Number: [id number] Patient Account Number: [id number] Date of Birth/Sex: 18-Dec-1960 (55 y.o. Female) Treating RN: Macarthur Critchley Primary Care Physician: Other Clinician: Referring Physician: Orvilla Fus Treating Physician/Extender: Frann Rider in Treatment: 0 Active Inactive Orientation to the Wound Care Program Nursing Diagnoses: Knowledge deficit related to the wound healing center program Goals: Patient/caregiver will verbalize understanding of the [place] Program Date Initiated: 09/20/2015 Goal Status: Active Interventions: Provide education on orientation to the wound center Notes: Wound/Skin Impairment Nursing Diagnoses: Knowledge deficit related to ulceration/compromised skin integrity Goals: Patient/caregiver will verbalize understanding of skin care regimen Date Initiated: 09/20/2015 Goal Status: Active Ulcer/skin breakdown will have a volume reduction of 30% by week 4 Date Initiated: 09/20/2015 Goal Status: Active Ulcer/skin breakdown will have a volume reduction of 50% by week 8 Date Initiated: 09/20/2015 Goal Status: Active Ulcer/skin breakdown will have a volume reduction of 80% by week 12 Date Initiated: 09/20/2015 Goal Status: Active Ulcer/skin breakdown will heal within 14 weeks Date Initiated: 09/20/2015 Goal Status: Active Kelli [name], Kelli [name] ([id number]) Interventions: Assess patient/caregiver ability to obtain necessary supplies Assess patient/caregiver ability to perform ulcer/skin care regimen upon admission and as needed Assess ulceration(s) every visit Provide education on ulcer and skin care Notes: Electronic Signature(s) Signed: 09/20/2015 4:29:37 PM By: Rebecca Eaton, RN, Sendra Entered By: Rebecca Eaton RN, Sendra on 09/20/2015 10:13:28 Kelli [name] ([id number]) -------------------------------------------------------------------------------- Pain Assessment Details Patient Name: Kelli [name] Date of Service: 09/20/2015 9:30 AM Medical Record Number: [id number] Patient Account Number: [id number] Date of Birth/Sex: Mar 25, 1961 (55 y.o. Female) Treating RN: Macarthur Critchley Primary Care Physician: Other Clinician: Referring Physician: Orvilla Fus Treating  Physician/Extender: Frann Rider in Treatment: 0 Active Problems Location of Pain Severity and Description of Pain Patient Has Paino Yes Site Locations Pain Location: Pain in Ulcers Rate the pain. Current Pain Level: 2 Character of Pain Describe the Pain: Tender Pain Management and Medication Current Pain Management: Electronic Signature(s) Signed: 09/20/2015 4:29:37 PM By: Rebecca Eaton, RN, Sendra Entered By: Rebecca Eaton RN, Sendra on 09/20/2015 09:42:37 Kelli [name] ([id number]) -------------------------------------------------------------------------------- Patient/Caregiver Education Details Patient Name: Kelli [name] Date of Service: 09/20/2015 9:30 AM Medical Record Number: [id number] Patient Account Number: [id number] Date of Birth/Gender: 07-03-1961 (55 y.o. Female) Treating RN: Macarthur Critchley Primary Care Physician: Other Clinician: Referring Physician: Orvilla Fus Treating Physician/Extender: Frann Rider in Treatment: 0 Education Assessment Education Provided To: Patient Education Topics Provided Welcome To [facility] [place]: Handouts: Welcome To [facility] Methods: Explain/Verbal Responses: State content correctly Wound/Skin Impairment: Handouts: Caring for Your Ulcer, Skin Care Do's and Dont's Methods: Explain/Verbal Responses: State content correctly Electronic Signature(s) Signed: 09/20/2015 4:29:37 PM By: Rebecca Eaton, RN, Sendra Entered By: Rebecca Eaton RN, Sendra on 09/20/2015 10:14:08 Kelli [name] ([id number]) -------------------------------------------------------------------------------- Wound Assessment Details Patient Name: Kelli [name] Date of Service: 09/20/2015 9:30 AM Medical Record Number: [id number] Patient Account Number: [id number] Date of Birth/Sex: 02/12/1961 (55 y.o. Female) Treating RN: Macarthur Critchley Primary Care Physician: Other Clinician: Referring Physician: Orvilla Fus Treating  Physician/Extender: Frann Rider in Treatment: 0 Wound Status Wound Number: 1 Primary Etiology: To be determined Wound Location: Right Lower Leg - Lateral Wound Status: Open Wounding Event: Gradually Appeared Comorbid History: Type II Diabetes, Neuropathy Date Acquired: 05/26/2015 Weeks Of Treatment: 0 Clustered Wound:  No Photos Wound Measurements Length: (cm) 1.5 Width: (cm) 1.5 Depth: (cm) 0.1 Area: (cm) 1.767 Volume: (cm) 0.177 % Reduction in Area: 0% % Reduction in Volume: 0% Epithelialization: None Tunneling: No Undermining: No Wound Description Classification: Partial Thickness Foul Odor A Diabetic Severity (Wagner): Grade 1 Wound Margin: Flat and Intact Exudate Amount: Small fter Cleansing: No Wound Bed Granulation Amount: Small (1-33%) Exposed Structure Granulation Quality: Red Fascia Exposed: No Necrotic Amount: Large (67-100%) Fat Layer Exposed: No Necrotic Quality: Adherent Slough Tendon Exposed: No Muscle Exposed: No Joint Exposed: No Manalo, [name] ([id number]) Bone Exposed: No Limited to Skin Breakdown Periwound Skin Texture Texture Color No Abnormalities Noted: No No Abnormalities Noted: No Callus: No Atrophie Blanche: No Crepitus: No Cyanosis: No Excoriation: No Ecchymosis: No Fluctuance: No Erythema: No Friable: No Hemosiderin Staining: No Induration: No Mottled: No Localized Edema: No Pallor: No Rash: No Rubor: No Scarring: No Moisture No Abnormalities Noted: No Dry / Scaly: Yes Maceration: No Moist: No Wound Preparation Ulcer Cleansing: Rinsed/Irrigated with Saline Topical Anesthetic Applied: Other: lidocaine 4%, Treatment Notes Wound #1 (Right, Lateral Lower Leg) 1. Cleansed with: Clean wound with Normal Saline 2. Anesthetic Topical Lidocaine 4% cream to wound bed prior to debridement 4. Dressing Applied: Santyl Ointment 5. Secondary Dressing Applied Gauze and Kerlix/Conform Electronic  Signature(s) Signed: 09/20/2015 4:29:37 PM By: Rebecca Eaton RN, Sendra Entered By: Rebecca Eaton RN, Sendra on 09/20/2015 12:25:56 Kelli [name] ([id number]) -------------------------------------------------------------------------------- [place] Details Patient Name: Kelli [name] Date of Service: 09/20/2015 9:30 AM Medical Record Number: [id number] Patient Account Number: [id number] Date of Birth/Sex: March 12, 1961 (55 y.o. Female) Treating RN: Macarthur Critchley Primary Care Physician: Other Clinician: Referring Physician: Orvilla Fus Treating Physician/Extender: Frann Rider in Treatment: 0 Vital Signs Time Taken: 09:57 Temperature (F): 97.5 Height (in): 67 Pulse (bpm): 88 Source: Stated Blood Pressure (mmHg): 107/59 Weight (lbs): 160 Reference Range: 80 - 120 mg / dl Source: Stated Body Mass Index (BMI): 25.1 Electronic Signature(s) Signed: 09/20/2015 4:29:37 PM By: Rebecca Eaton RN, Sendra Entered By: Rebecca Eaton RN, Sendra on 09/20/2015 09:57:53

## 2015-09-21 NOTE — Progress Notes (Signed)
Kelli [name], Kelli [name] ([id number]) Visit Report for 09/20/2015 Abuse/Suicide Risk Screen Details Patient Name: Kelli [name], Kelli [name] 09/20/2015 9:30 Date of Service: AM Medical Record [id number] Number: Patient Account Number: [id number] 1960/12/22 (55 y.o. Treating RN: Macarthur Critchley Date of Birth/Sex: Female) Other Clinician: Primary Care Physician: Treating Britto, Errol Referring Physician: Orvilla Fus Physician/Extender: Weeks in Treatment: 0 Abuse/Suicide Risk Screen Items Answer ABUSE/SUICIDE RISK SCREEN: Has anyone close to you tried to hurt or harm you recentlyo No Do you feel uncomfortable with anyone in your familyo No Has anyone forced you do things that you didnot want to doo No Do you have any thoughts of harming yourselfo No Patient displays signs or symptoms of abuse and/or neglect. No Electronic Signature(s) Signed: 09/20/2015 4:29:37 PM By: Rebecca Eaton RN, Sendra Entered By: Rebecca Eaton RN, Sendra on 09/20/2015 09:53:41 Kelli [name] ([id number]) -------------------------------------------------------------------------------- Activities of Daily Living Details Patient Name: Kelli [name], Kelli [name] 09/20/2015 9:30 Date of Service: AM Medical Record [id number] Number: Patient Account Number: [id number] July 07, 1961 (55 y.o. Treating RN: Macarthur Critchley Date of Birth/Sex: Female) Other Clinician: Primary Care Physician: Treating Christin Fudge Referring Physician: Orvilla Fus Physician/Extender: Weeks in Treatment: 0 Activities of Daily Living Items Answer Activities of Daily Living (Please select one for each item) Drive Automobile Completely Able Take Medications Completely Able Use Telephone Completely Able Care for Appearance Completely Able Use Toilet Completely Able Bath / Shower Completely Able Dress Self Completely Able Feed Self Completely Able Walk Completely Able Get In / Out Bed Completely Able Housework Completely Able Prepare Meals Completely  [place] for Self Completely Able Electronic Signature(s) Signed: 09/20/2015 4:29:37 PM By: Rebecca Eaton RN, Sendra Entered By: Rebecca Eaton RN, Sendra on 09/20/2015 09:53:51 Kelli [name] ([id number]) -------------------------------------------------------------------------------- Education Assessment Details Patient Name: Kelli [name] 09/20/2015 9:30 Date of Service: AM Medical Record [id number] Number: Patient Account Number: [id number] [birth year] (55 y.o. Treating RN: Macarthur Critchley Date of Birth/Sex: Female) Other Clinician: Primary Care Physician: Treating Christin Fudge Referring Physician: Orvilla Fus Physician/Extender: [place] in Treatment: 0 Primary Learner Assessed: Patient Learning Preferences/Education Level/Primary Language Learning Preference: Explanation Highest Education Level: Grade School Preferred Language: English Cognitive Barrier Assessment/Beliefs Language Barrier: No Translator Needed: No Memory Deficit: No Emotional Barrier: No Cultural/Religious Beliefs Affecting Medical No Care: Physical Barrier Assessment Impaired Vision: Yes Glasses Impaired Hearing: No Decreased Hand dexterity: No Knowledge/Comprehension Assessment Knowledge Level: High Comprehension Level: High Ability to understand written High instructions: Ability to understand verbal High instructions: Motivation Assessment Anxiety Level: Calm Cooperation: Cooperative Education Importance: Acknowledges Need Interest in Health Problems: Asks Questions Perception: Coherent Willingness to Engage in Self- High Management Activities: Readiness to Engage in Self- High Management Activities: Kelli [name], Kelli [name] ([id number]) Electronic Signature(s) Signed: 09/20/2015 4:29:37 PM By: Rebecca Eaton RN, Sendra Entered By: Rebecca Eaton RN, Sendra on 09/20/2015 09:54:20 Kelli [name]  ([id number]) -------------------------------------------------------------------------------- Fall Risk Assessment Details Patient Name: Kelli [name] 09/20/2015 9:30 Date of Service: AM Medical Record [id number] Number: Patient Account Number: [id number] 21-Mar-1961 (55 y.o. Treating RN: Macarthur Critchley Date of Birth/Sex: Female) Other Clinician: Primary Care Physician: Treating Christin Fudge Referring Physician: Orvilla Fus Physician/Extender: Weeks in Treatment: 0 Fall Risk Assessment Items Have you had 2 or more falls in the last 12 monthso 0 No Have you had any fall that resulted in injury in the last 12 monthso 0 No FALL RISK ASSESSMENT: History of falling - immediate or within 3 months 0 No Secondary diagnosis 15 Yes Ambulatory aid None/bed rest/wheelchair/nurse 0 Yes Crutches/cane/walker 0 No Furniture 0 No IV  Access/Saline Lock 0 No Gait/Training Normal/bed rest/immobile 0 Yes Weak 0 No Impaired 0 No Mental Status Oriented to own ability 0 Yes Electronic Signature(s) Signed: 09/20/2015 4:29:37 PM By: Rebecca Eaton, RN, Sendra Entered By: Rebecca Eaton RN, Sendra on 09/20/2015 09:54:35 Kelli [name] ([id number]) -------------------------------------------------------------------------------- Foot Assessment Details Patient Name: Kelli [name], Kelli [name] 09/20/2015 9:30 Date of Service: AM Medical Record [id number] Number: Patient Account Number: [id number] 1960-12-07 (55 y.o. Treating RN: Macarthur Critchley Date of Birth/Sex: Female) Other Clinician: Primary Care Physician: Treating Britto, Errol Referring Physician: Orvilla Fus Physician/Extender: Weeks in Treatment: 0 Foot Assessment Items Site Locations + = Sensation present, - = Sensation absent, C = Callus, U = Ulcer R = Redness, W = Warmth, M = Maceration, PU = Pre-ulcerative lesion F = Fissure, S = Swelling, D = Dryness Assessment Right: Left: Other Deformity: No No Prior Foot Ulcer: No No Prior  Amputation: No No Charcot Joint: No No Ambulatory Status: Ambulatory Without Help Gait: Steady Electronic Signature(s) Signed: 09/20/2015 4:29:37 PM By: Rebecca Eaton, RN, Sendra Entered By: Rebecca Eaton RN, Sendra on 09/20/2015 09:57:06 Kelli [name] ([id number]) -------------------------------------------------------------------------------- Nutrition Risk Assessment Details Patient Name: Kelli [name] 09/20/2015 9:30 Date of Service: AM Medical Record [id number] Number: Patient Account Number: [id number] 1961-07-24 (55 y.o. Treating RN: Macarthur Critchley Date of Birth/Sex: Female) Other Clinician: Primary Care Physician: Treating Christin Fudge Referring Physician: Orvilla Fus Physician/Extender: Weeks in Treatment: 0 Height (in): Weight (lbs): Body Mass Index (BMI): Nutrition Risk Assessment Items NUTRITION RISK SCREEN: I have an illness or condition that made me change the kind and/or 0 No amount of food I eat I eat fewer than two meals per day 0 No I eat few fruits and vegetables, or milk products 0 No I have three or more drinks of beer, liquor or wine almost every day 0 No I have tooth or mouth problems that make it hard for me to eat 0 No I don't always have enough money to buy the food I need 0 No I eat alone most of the time 0 No I take three or more different prescribed or over-the-counter drugs a 1 Yes day Without wanting to, I have lost or gained 10 pounds in the last six 2 Yes months I am not always physically able to shop, cook and/or feed myself 0 No Nutrition Protocols Good Risk Protocol Provide education on elevated blood sugars Moderate Risk Protocol 0 and impact on wound healing, as applicable Electronic Signature(s) Signed: 09/20/2015 4:29:37 PM By: Rebecca Eaton, RN, Sendra Entered By: Rebecca Eaton RN, Sendra on 09/20/2015 09:54:50

## 2015-09-28 ENCOUNTER — Encounter: Payer: BLUE CROSS/BLUE SHIELD | Attending: Surgery | Admitting: Surgery

## 2015-09-28 DIAGNOSIS — E1140 Type 2 diabetes mellitus with diabetic neuropathy, unspecified: Secondary | ICD-10-CM | POA: Diagnosis not present

## 2015-09-28 DIAGNOSIS — Z7984 Long term (current) use of oral hypoglycemic drugs: Secondary | ICD-10-CM | POA: Insufficient documentation

## 2015-09-28 DIAGNOSIS — Z79899 Other long term (current) drug therapy: Secondary | ICD-10-CM | POA: Diagnosis not present

## 2015-09-28 DIAGNOSIS — E11622 Type 2 diabetes mellitus with other skin ulcer: Secondary | ICD-10-CM | POA: Diagnosis not present

## 2015-09-28 DIAGNOSIS — L97212 Non-pressure chronic ulcer of right calf with fat layer exposed: Secondary | ICD-10-CM | POA: Insufficient documentation

## 2015-09-28 DIAGNOSIS — Z880 Allergy status to penicillin: Secondary | ICD-10-CM | POA: Diagnosis not present

## 2015-09-28 DIAGNOSIS — R05 Cough: Secondary | ICD-10-CM | POA: Diagnosis not present

## 2015-09-28 DIAGNOSIS — Z794 Long term (current) use of insulin: Secondary | ICD-10-CM | POA: Diagnosis not present

## 2015-10-02 NOTE — Progress Notes (Signed)
[name], [name] ([id number]) Visit Report for 09/28/2015 Chief Complaint Document Details Patient Name: Kelli [name], Kelli [name]. Date of Service: 09/28/2015 8:45 AM Medical Record Number: [id number] Patient Account Number: [id number] Date of Birth/Sex: 07-19-1961 (54 y.o. Female) Treating RN: Carolyne Fiscal, Debi Primary Care Physician: Other Clinician: Referring Physician: Orvilla Fus Treating Physician/Extender: Frann Rider in Treatment: 1 Information Obtained from: Patient Chief Complaint Patients presents for treatment of an open diabetic ulcer to the right lower leg near her cough for about 3 months Electronic Signature(s) Signed: 09/28/2015 9:13:46 AM By: Christin Fudge MD, FACS Entered By: Christin Fudge on 09/28/2015 09:13:46 Kelli [name] ([id number]) -------------------------------------------------------------------------------- Debridement Details Patient Name: Kelli [name] Date of Service: 09/28/2015 8:45 AM Medical Record Number: [id number] Patient Account Number: [id number] Date of Birth/Sex: July 05, 1961 (54 y.o. Female) Treating RN: Carolyne Fiscal, Debi Primary Care Physician: Other Clinician: Referring Physician: Orvilla Fus Treating Physician/Extender: Frann Rider in Treatment: 1 Debridement Performed for Wound #1 Right,Lateral Lower Leg Assessment: Performed By: Physician Christin Fudge, MD Debridement: Debridement Pre-procedure Yes Verification/Time Out Taken: Start Time: 09:10 Pain Control: Other : lidocaine 4% cream Level: Skin/Subcutaneous Tissue Total Area Debrided (L x 1.5 (cm) x 1.2 (cm) = 1.8 (cm) W): Tissue and other Viable, Non-Viable, Exudate, Fibrin/Slough, Subcutaneous material debrided: Instrument: Curette Bleeding: Minimum Hemostasis Achieved: Pressure End Time: 09:11 Procedural Pain: 0 Post Procedural Pain: 0 Response to Treatment: Procedure was tolerated well Post Debridement Measurements of Total Wound Length: (cm)  1.5 Width: (cm) 1.2 Depth: (cm) 0.2 Volume: (cm) 0.283 Post Procedure Diagnosis Same as Pre-procedure Electronic Signature(s) Signed: 09/28/2015 9:13:34 AM By: Christin Fudge MD, FACS Signed: 10/01/2015 4:43:39 PM By: Alric Quan Entered By: Christin Fudge on 09/28/2015 09:13:34 Kelli [name] ([id number]) -------------------------------------------------------------------------------- HPI Details Patient Name: Kelli [name] Date of Service: 09/28/2015 8:45 AM Medical Record Number: [id number] Patient Account Number: [id number] Date of Birth/Sex: November 12, 1960 (54 y.o. Female) Treating RN: Carolyne Fiscal, Debi Primary Care Physician: Other Clinician: Referring Physician: Orvilla Fus Treating Physician/Extender: Frann Rider in Treatment: 1 History of Present Illness Location: right lateral calf region Quality: Patient reports No Pain. Severity: Patient states wound are getting worse. Duration: Patient has had the wound for > 3 months prior to seeking treatment at the wound center Context: The wound appeared gradually over time Modifying Factors: Other treatment(s) tried include:she has seen a dermatologist and her PCP and has had 3 rounds of antibiotics in the last 3 months Associated Signs and Symptoms: Patient reports having increase swelling. HPI Description: 55 year old patient referred to as by her PCP Dr. Orvilla Fus, for a wound on her right lower calf which has been there for about 3 months. She has received antibiotics on 3 different occasions for this. Her past medical history is significant for diabetes mellitus type 2, depression and migraine. She has not had any surgery. She was known to be a smoker and has quit smoking 30 years ago. she was told she had a staph infection and this is possibly by a culture done by the dermatologist. I do not believe a skin biopsy has been done. Electronic Signature(s) Signed: 09/28/2015 9:13:50 AM By: Christin Fudge MD,  FACS Entered By: Christin Fudge on 09/28/2015 09:13:50 Kelli [name] ([id number]) -------------------------------------------------------------------------------- Physical Exam Details Patient Name: Kelli [name] Date of Service: 09/28/2015 8:45 AM Medical Record Number: [id number] Patient Account Number: [id number] Date of Birth/Sex: [birth year] (54 y.o. Female) Treating RN: Ahmed Prima Primary Care Physician: Other Clinician: Referring Physician: Orvilla Fus Treating Physician/Extender: Frann Rider in Treatment:  1 Constitutional . Pulse regular. Respirations normal and unlabored. Afebrile. . Eyes Nonicteric. Reactive to light. Ears, Nose, Mouth, and Throat Lips, teeth, and gums WNL.Marland Kitchen Moist mucosa without lesions. Neck supple and nontender. No palpable supraclavicular or cervical adenopathy. Normal sized without goiter. Respiratory WNL. No retractions.. Cardiovascular Pedal Pulses WNL. No clubbing, cyanosis or edema. Lymphatic No adneopathy. No adenopathy. No adenopathy. Musculoskeletal Adexa without tenderness or enlargement.. Digits and nails w/o clubbing, cyanosis, infection, petechiae, ischemia, or inflammatory conditions.. Integumentary (Hair, Skin) No suspicious lesions. No crepitus or fluctuance. No peri-wound warmth or erythema. No masses.Marland Kitchen Psychiatric Judgement and insight Intact.. No evidence of depression, anxiety, or agitation.. Notes the debris has gone down significantly but sharp dissection in the subcutaneous tissue was done to remove some of the necrotic tissue and there is some healthy granulation tissue at the edges. The lichenification at the edges is also slightly better. Electronic Signature(s) Signed: 09/28/2015 9:14:38 AM By: Christin Fudge MD, FACS Entered By: Christin Fudge on 09/28/2015 09:14:38 Kelli [name] ([id number]) -------------------------------------------------------------------------------- Physician Orders  Details Patient Name: Kelli [name] Date of Service: 09/28/2015 8:45 AM Medical Record Number: [id number] Patient Account Number: [id number] Date of Birth/Sex: 02/05/1961 (54 y.o. Female) Treating RN: Carolyne Fiscal, Debi Primary Care Physician: Other Clinician: Referring Physician: Orvilla Fus Treating Physician/Extender: Frann Rider in Treatment: 1 Verbal / Phone Orders: Yes Clinician: Pinkerton, Debi Read Back and Verified: Yes Diagnosis Coding Wound Cleansing Wound #1 Right,Lateral Lower Leg o Cleanse wound with mild soap and water Primary Wound Dressing Wound #1 Right,Lateral Lower Leg o Santyl Ointment Secondary Dressing Wound #1 Right,Lateral Lower Leg o Dry Gauze o Boardered Foam Dressing Dressing Change Frequency Wound #1 Right,Lateral Lower Leg o Change dressing every day. Follow-up Appointments Wound #1 Right,Lateral Lower Leg o Return Appointment in 1 week. Electronic Signature(s) Signed: 09/28/2015 4:33:45 PM By: Christin Fudge MD, FACS Signed: 10/01/2015 4:43:39 PM By: Alric Quan Entered By: Alric Quan on 09/28/2015 09:13:19 Kelli [name], Kelli [name] ([id number]) -------------------------------------------------------------------------------- Prescription 09/28/2015 Patient Name: Kelli [name] Physician: Christin Fudge MD Date of Birth: [birth year] NPI#: [id number] Sex: F DEA#: [id number] Phone #: [phone] License #: Patient Address: [place] [address] [place], [place] [address] [facility] [address], [place], [place] [address] [phone] Allergies penicillin Reaction: nausea, vomiting Physician's Orders Santyl Ointment Signature(s): Date(s): Electronic Signature(s) Signed: 09/28/2015 4:33:45 PM By: Christin Fudge MD, FACS Signed: 10/01/2015 4:43:39 PM By: Alric Quan Entered By: Alric Quan on 09/28/2015 09:13:19 Kelli [name]  ([id number]) --------------------------------------------------------------------------------  Problem List Details Patient Name: Kelli [name], Kelli [name] Date of Service: 09/28/2015 8:45 AM Medical Record Number: [id number] Patient Account Number: [id number] Date of Birth/Sex: Apr 08, 1961 (54 y.o. Female) Treating RN: Ahmed Prima Primary Care Physician: Other Clinician: Referring Physician: Orvilla Fus Treating Physician/Extender: Frann Rider in Treatment: 1 Active Problems ICD-10 Encounter Code Description Active Date Diagnosis E11.622 Type 2 diabetes mellitus with other skin ulcer 09/20/2015 Yes L97.212 Non-pressure chronic ulcer of right calf with fat layer 09/20/2015 Yes exposed Inactive Problems Resolved Problems Electronic Signature(s) Signed: 09/28/2015 9:13:27 AM By: Christin Fudge MD, FACS Entered By: Christin Fudge on 09/28/2015 09:13:26 Kelli [name] ([id number]) -------------------------------------------------------------------------------- Progress Note Details Patient Name: Kelli [name] Date of Service: 09/28/2015 8:45 AM Medical Record Number: [id number] Patient Account Number: [id number] Date of Birth/Sex: Jul 04, 1961 (54 y.o. Female) Treating RN: Ahmed Prima Primary Care Physician: Other Clinician: Referring Physician: Orvilla Fus Treating Physician/Extender: Frann Rider in Treatment: 1 Subjective Chief Complaint Information obtained  from Patient Patients presents for treatment of an open diabetic ulcer to the right lower leg near her cough for about 3 months History of Present Illness (HPI) The following HPI elements were documented for the patient's wound: Location: right lateral calf region Quality: Patient reports No Pain. Severity: Patient states wound are getting worse. Duration: Patient has had the wound for > 3 months prior to seeking treatment at the wound center Context: The wound appeared gradually over time Modifying  Factors: Other treatment(s) tried include:she has seen a dermatologist and her PCP and has had 3 rounds of antibiotics in the last 3 months Associated Signs and Symptoms: Patient reports having increase swelling. 55 year old patient referred to as by her PCP Dr. Orvilla Fus, for a wound on her right lower calf which has been there for about 3 months. She has received antibiotics on 3 different occasions for this. Her past medical history is significant for diabetes mellitus type 2, depression and migraine. She has not had any surgery. She was known to be a smoker and has quit smoking 30 years ago. she was told she had a staph infection and this is possibly by a culture done by the dermatologist. I do not believe a skin biopsy has been done. Objective Constitutional Pulse regular. Respirations normal and unlabored. Afebrile. Vitals Time Taken: 8:53 AM, Height: 67 in, Weight: 160 lbs, BMI: 25.1, Temperature: 97.7 F, Pulse: 91 bpm, Respiratory Rate: 20 breaths/min, Blood Pressure: 97/71 mmHg. Eyes Kelli [name], [name] ([id number]) Nonicteric. Reactive to light. Ears, Nose, Mouth, and Throat Lips, teeth, and gums WNL.Marland Kitchen Moist mucosa without lesions. Neck supple and nontender. No palpable supraclavicular or cervical adenopathy. Normal sized without goiter. Respiratory WNL. No retractions.. Cardiovascular Pedal Pulses WNL. No clubbing, cyanosis or edema. Lymphatic No adneopathy. No adenopathy. No adenopathy. Musculoskeletal Adexa without tenderness or enlargement.. Digits and nails w/o clubbing, cyanosis, infection, petechiae, ischemia, or inflammatory conditions.Marland Kitchen Psychiatric Judgement and insight Intact.. No evidence of depression, anxiety, or agitation.. General Notes: the debris has gone down significantly but sharp dissection in the subcutaneous tissue was done to remove some of the necrotic tissue and there is some healthy granulation tissue at the edges. The lichenification at the  edges is also slightly better. Integumentary (Hair, Skin) No suspicious lesions. No crepitus or fluctuance. No peri-wound warmth or erythema. No masses.. Wound #1 status is Open. Original cause of wound was Gradually Appeared. The wound is located on the Right,Lateral Lower Leg. The wound measures 1.5cm length x 1.2cm width x 0.1cm depth; 1.414cm^2 area and 0.141cm^3 volume. The wound is limited to skin breakdown. There is no tunneling or undermining noted. There is a small amount of serous drainage noted. The wound margin is flat and intact. There is medium (34-66%) red granulation within the wound bed. There is a medium (34-66%) amount of necrotic tissue within the wound bed including Adherent Slough. The periwound skin appearance exhibited: Dry/Scaly. The periwound skin appearance did not exhibit: Callus, Crepitus, Excoriation, Fluctuance, Friable, Induration, Localized Edema, Rash, Scarring, Maceration, Moist, Atrophie Blanche, Cyanosis, Ecchymosis, Hemosiderin Staining, Mottled, Pallor, Rubor, Erythema. Assessment Active Problems ICD-10 Kelli [name], Kelli [name] ([id number]) E11.622 - Type 2 diabetes mellitus with other skin ulcer L97.212 - Non-pressure chronic ulcer of right calf with fat layer exposed Procedures Wound #1 Wound #1 is a To be determined located on the Right,Lateral Lower Leg . There was a Skin/Subcutaneous Tissue Debridement [id number]) debridement with total area of 1.8 sq cm performed by Christin Fudge, MD. with the following instrument(s): Curette to  remove Viable and Non-Viable tissue/material including Exudate, Fibrin/Slough, and Subcutaneous after achieving pain control using Other (lidocaine 4% cream). A time out was conducted prior to the start of the procedure. A Minimum amount of bleeding was controlled with Pressure. The procedure was tolerated well with a pain level of 0 throughout and a pain level of 0 following the procedure. Post Debridement Measurements: 1.5cm  length x 1.2cm width x 0.2cm depth; 0.283cm^3 volume. Post procedure Diagnosis Wound #1: Same as Pre-Procedure Plan Wound Cleansing: Wound #1 Right,Lateral Lower Leg: Cleanse wound with mild soap and water Primary Wound Dressing: Wound #1 Right,Lateral Lower Leg: Santyl Ointment Secondary Dressing: Wound #1 Right,Lateral Lower Leg: Dry Gauze Boardered Foam Dressing Dressing Change Frequency: Wound #1 Right,Lateral Lower Leg: Change dressing every day. Follow-up Appointments: Wound #1 Right,Lateral Lower Leg: Return Appointment in 1 week. Kelli [name], Kelli [name] ([id number]) After debriding I have recommended Santyl ointment to be applied on a daily basis and have discussed with the possibility of a skin biopsy at a later stage if this does not improve appropriately. She will also need a venous duplex study if the problem persist. All her questions have been answered and she will see me back next week Electronic Signature(s) Signed: 09/28/2015 9:14:48 AM By: Christin Fudge MD, FACS Entered By: Christin Fudge on 09/28/2015 09:14:48 Kelli [name] ([id number]) -------------------------------------------------------------------------------- SuperBill Details Patient Name: Kelli [name] Date of Service: 09/28/2015 Medical Record Number: [id number] Patient Account Number: [id number] Date of Birth/Sex: 03-12-1961 (54 y.o. Female) Treating RN: Carolyne Fiscal, Debi Primary Care Physician: Other Clinician: Referring Physician: Orvilla Fus Treating Physician/Extender: Frann Rider in Treatment: 1 Diagnosis Coding ICD-10 Codes Code Description E11.622 Type 2 diabetes mellitus with other skin ulcer L97.212 Non-pressure chronic ulcer of right calf with fat layer exposed Facility Procedures CPT4 Code: [id number] Description: [facility] TISSUE 20 SQ CM/< ICD-10 Description Diagnosis E11.622 Type 2 diabetes mellitus with other skin ulcer L97.212 Non-pressure chronic ulcer of right calf  with fat Modifier: layer exposed Quantity: 1 Physician Procedures CPT4 Code: [id number] Description: [id number] - WC PHYS SUBQ TISS 20 SQ CM ICD-10 Description Diagnosis E11.622 Type 2 diabetes mellitus with other skin ulcer 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

## 2015-10-02 NOTE — Progress Notes (Signed)
DENILLE, KATZENSTEIN ([id number]) Visit Report for 09/28/2015 Arrival Information Details Patient Name: Kelli [name], Kelli [name]. Date of Service: 09/28/2015 8:45 AM Medical Record Number: [id number] Patient Account Number: [id number] Date of Birth/Sex: 09/16/1960 (55 y.o. Female) Treating RN: Carolyne Fiscal, Debi Primary Care Physician: Other Clinician: Referring Physician: Orvilla Fus Treating Physician/Extender: Frann Rider in Treatment: 1 Visit Information History Since Last Visit All ordered tests and consults were completed: No Patient Arrived: Ambulatory Added or deleted any medications: No Arrival Time: 08:52 Any new allergies or adverse reactions: No Accompanied By: self Had a fall or experienced change in No Transfer Assistance: None activities of daily living that may affect Patient Identification Verified: Yes risk of falls: Secondary Verification Process Yes Signs or symptoms of abuse/neglect since last No Completed: visito Patient Requires Transmission- No Hospitalized since last visit: No Based Precautions: Pain Present Now: No Patient Has Alerts: Yes Patient Alerts: 1/26 ABI (L)1.09 (R)1.05 Electronic Signature(s) Signed: 10/01/2015 4:43:39 PM By: Alric Quan Entered By: Alric Quan on 09/28/2015 08:52:54 Kelli [name] ([id number]) -------------------------------------------------------------------------------- Encounter Discharge Information Details Patient Name: Kelli [name] Date of Service: 09/28/2015 8:45 AM Medical Record Number: [id number] Patient Account Number: [id number] Date of Birth/Sex: 1961-02-14 (55 y.o. Female) Treating RN: Carolyne Fiscal, Debi Primary Care Physician: Other Clinician: Referring Physician: Orvilla Fus Treating Physician/Extender: Frann Rider in Treatment: 1 Encounter Discharge Information Items Discharge Pain Level: 0 Discharge Condition: Stable Ambulatory Status: Ambulatory Discharge Destination:  Home Transportation: Private Auto Accompanied By: self Schedule Follow-up Appointment: Yes Medication Reconciliation completed and provided to Patient/Care Yes Kelli [name]: Provided on Clinical Summary of Care: 09/28/2015 Form Type Recipient Paper Patient Fithian Signature(s) Signed: 09/28/2015 9:26:28 AM By: Ruthine Dose Entered By: Ruthine Dose on 09/28/2015 [id number] Kelli [name] ([id number]) -------------------------------------------------------------------------------- Lower Extremity Assessment Details Patient Name: Kelli [name] Date of Service: 09/28/2015 8:45 AM Medical Record Number: [id number] Patient Account Number: [id number] Date of Birth/Sex: [birth year] (55 y.o. Female) Treating RN: Carolyne Fiscal, Debi Primary Care Physician: Other Clinician: Referring Physician: Orvilla Fus Treating Physician/Extender: Frann Rider in Treatment: 1 Edema Assessment Assessed: [Left: No] [Right: No] E[Left: dema] [Right: :] Calf Left: Right: Point of Measurement: cm From Medial Instep cm 36 cm Ankle Left: Right: Point of Measurement: cm From Medial Instep cm 20.5 cm Vascular Assessment Pulses: Posterior Tibial Dorsalis Pedis Palpable: [Right:Yes] Extremity colors, hair growth, and conditions: Extremity Color: [Right:Normal] Temperature of Extremity: [Right:Warm] Capillary Refill: [Right:< 3 seconds] Toe Nail Assessment Left: Right: Thick: No Discolored: No Deformed: No Improper Length and Hygiene: No Electronic Signature(s) Signed: 10/01/2015 4:43:39 PM By: Alric Quan Entered By: Alric Quan on 09/28/2015 08:58:04 Kelli [name], Kelli [name] ([id number]) -------------------------------------------------------------------------------- Multi Wound Chart Details Patient Name: Kelli [name] Date of Service: 09/28/2015 8:45 AM Medical Record Number: [id number] Patient Account Number: [id number] Date of Birth/Sex: Apr 25, 1961 (55 y.o. Female) Treating RN:  Carolyne Fiscal, Debi Primary Care Physician: Other Clinician: Referring Physician: Orvilla Fus Treating Physician/Extender: Frann Rider in Treatment: 1 Vital Signs Height(in): 67 Pulse(bpm): 91 Weight(lbs): 160 Blood Pressure 97/71 (mmHg): Body Mass Index(BMI): 25 Temperature(F): 97.7 Respiratory Rate 20 (breaths/min): Photos: [1:No Photos] [N/A:N/A] Wound Location: [1:Right Lower Leg - Lateral] [N/A:N/A] Wounding Event: [1:Gradually Appeared] [N/A:N/A] Primary Etiology: [1:To be determined] [N/A:N/A] Comorbid History: [1:Type II Diabetes, Neuropathy] [N/A:N/A] Date Acquired: [1:05/26/2015] [N/A:N/A] Weeks of Treatment: [1:1] [N/A:N/A] Wound Status: [1:Open] [N/A:N/A] Measurements L x W x D 1.5x1.2x0.1 [N/A:N/A] (cm) Area (cm) : [1:1.414] [N/A:N/A] Volume (cm) : [1:0.141] [N/A:N/A] % Reduction in Area: [1:20.00%] [N/A:N/A] % Reduction in Volume: 20.30% [  N/A:N/A] Classification: [1:Partial Thickness] [N/A:N/A] HBO Classification: [1:Grade 1] [N/A:N/A] Exudate Amount: [1:Small] [N/A:N/A] Exudate Type: [1:Serous] [N/A:N/A] Exudate Color: [1:amber] [N/A:N/A] Wound Margin: [1:Flat and Intact] [N/A:N/A] Granulation Amount: [1:Medium (34-66%)] [N/A:N/A] Granulation Quality: [1:Red] [N/A:N/A] Necrotic Amount: [1:Medium (34-66%)] [N/A:N/A] Exposed Structures: [1:Fascia: No Fat: No Tendon: No Muscle: No Joint: No Bone: No] [N/A:N/A] Limited to Skin Breakdown Epithelialization: None N/A N/A Periwound Skin Texture: Edema: No N/A N/A Excoriation: No Induration: No Callus: No Crepitus: No Fluctuance: No Friable: No Rash: No Scarring: No Periwound Skin Dry/Scaly: Yes N/A N/A Moisture: Maceration: No Moist: No Periwound Skin Color: Atrophie Blanche: No N/A N/A Cyanosis: No Ecchymosis: No Erythema: No Hemosiderin Staining: No Mottled: No Pallor: No Rubor: No Tenderness on No N/A N/A Palpation: Wound Preparation: Ulcer Cleansing: N/A  N/A Rinsed/Irrigated with Saline Topical Anesthetic Applied: Other: lidocaine 4% Treatment Notes Electronic Signature(s) Signed: 10/01/2015 4:43:39 PM By: Alric Quan Entered By: Alric Quan on 09/28/2015 09:01:09 Kelli [name] ([id number]) -------------------------------------------------------------------------------- [place] Details Patient Name: Kelli [name] Date of Service: 09/28/2015 8:45 AM Medical Record Number: [id number] Patient Account Number: [id number] Date of Birth/Sex: 1961/07/28 (54 y.o. Female) Treating RN: Carolyne Fiscal, Debi Primary Care Physician: Other Clinician: Referring Physician: Orvilla Fus Treating Physician/Extender: Frann Rider in Treatment: 1 Active Inactive Orientation to the Wound Care Program Nursing Diagnoses: Knowledge deficit related to the wound healing center program Goals: Patient/caregiver will verbalize understanding of the [place] Program Date Initiated: 09/20/2015 Goal Status: Active Interventions: Provide education on orientation to the wound center Notes: Wound/Skin Impairment Nursing Diagnoses: Knowledge deficit related to ulceration/compromised skin integrity Goals: Patient/caregiver will verbalize understanding of skin care regimen Date Initiated: 09/20/2015 Goal Status: Active Ulcer/skin breakdown will have a volume reduction of 30% by week 4 Date Initiated: 09/20/2015 Goal Status: Active Ulcer/skin breakdown will have a volume reduction of 50% by week 8 Date Initiated: 09/20/2015 Goal Status: Active Ulcer/skin breakdown will have a volume reduction of 80% by week 12 Date Initiated: 09/20/2015 Goal Status: Active Ulcer/skin breakdown will heal within 14 weeks Date Initiated: 09/20/2015 Goal Status: Active Kelli [name], Kelli [name] ([id number]) Interventions: Assess patient/caregiver ability to obtain necessary supplies Assess patient/caregiver ability to perform ulcer/skin  care regimen upon admission and as needed Assess ulceration(s) every visit Provide education on ulcer and skin care Notes: Electronic Signature(s) Signed: 10/01/2015 4:43:39 PM By: Alric Quan Entered By: Alric Quan on 09/28/2015 09:01:03 Kelli [name] ([id number]) -------------------------------------------------------------------------------- Pain Assessment Details Patient Name: Kelli [name] Date of Service: 09/28/2015 8:45 AM Medical Record Number: [id number] Patient Account Number: [id number] Date of Birth/Sex: 09-12-1960 (54 y.o. Female) Treating RN: Carolyne Fiscal, Debi Primary Care Physician: Other Clinician: Referring Physician: Orvilla Fus Treating Physician/Extender: Frann Rider in Treatment: 1 Active Problems Location of Pain Severity and Description of Pain Patient Has Paino No Site Locations Pain Management and Medication Current Pain Management: Electronic Signature(s) Signed: 10/01/2015 4:43:39 PM By: Alric Quan Entered By: Alric Quan on 09/28/2015 08:52:59 Kelli [name] ([id number]) -------------------------------------------------------------------------------- Patient/Caregiver Education Details Patient Name: Kelli [name] Date of Service: 09/28/2015 8:45 AM Medical Record Number: [id number] Patient Account Number: [id number] Date of Birth/Gender: [birth year] (54 y.o. Female) Treating RN: Carolyne Fiscal, Debi Primary Care Physician: Other Clinician: Referring Physician: Orvilla Fus Treating Physician/Extender: Frann Rider in Treatment: 1 Education Assessment Education Provided To: Patient Education Topics Provided Wound/Skin Impairment: Handouts: Other: change dressing as directed Methods: Demonstration, Explain/Verbal Responses: State content correctly Electronic Signature(s) Signed: 10/01/2015 4:43:39 PM By: Alric Quan Entered By: Alric Quan on 09/28/2015 09:20:56  Kelli [name], Kelli [name]  ([id number]) -------------------------------------------------------------------------------- Wound Assessment Details Patient Name: Kelli [name], NAPP. Date of Service: 09/28/2015 8:45 AM Medical Record Number: [id number] Patient Account Number: [id number] Date of Birth/Sex: Oct 21, 1960 (54 y.o. Female) Treating RN: Ahmed Prima Primary Care Physician: Other Clinician: Referring Physician: Orvilla Fus Treating Physician/Extender: Frann Rider in Treatment: 1 Wound Status Wound Number: 1 Primary Etiology: To be determined Wound Location: Right Lower Leg - Lateral Wound Status: Open Wounding Event: Gradually Appeared Comorbid History: Type II Diabetes, Neuropathy Date Acquired: 05/26/2015 Weeks Of Treatment: 1 Clustered Wound: No Photos Photo Uploaded By: Alric Quan on 09/28/2015 16:43:21 Wound Measurements Length: (cm) 1.5 Width: (cm) 1.2 Depth: (cm) 0.1 Area: (cm) 1.414 Volume: (cm) 0.141 % Reduction in Area: 20% % Reduction in Volume: 20.3% Epithelialization: None Tunneling: No Undermining: No Wound Description Classification: Partial Thickness Foul Odor A Diabetic Severity (Wagner): Grade 1 Wound Margin: Flat and Intact Exudate Amount: Small Exudate Type: Serous Exudate Color: amber fter Cleansing: No Wound Bed Granulation Amount: Medium (34-66%) Exposed Structure Granulation Quality: Red Fascia Exposed: No Necrotic Amount: Medium (34-66%) Fat Layer Exposed: No Kelli [name], [name] ([id number]) Necrotic Quality: Adherent Slough Tendon Exposed: No Muscle Exposed: No Joint Exposed: No Bone Exposed: No Limited to Skin Breakdown Periwound Skin Texture Texture Color No Abnormalities Noted: No No Abnormalities Noted: No Callus: No Atrophie Blanche: No Crepitus: No Cyanosis: No Excoriation: No Ecchymosis: No Fluctuance: No Erythema: No Friable: No Hemosiderin Staining: No Induration: No Mottled: No Localized Edema: No Pallor: No Rash:  No Rubor: No Scarring: No Moisture No Abnormalities Noted: No Dry / Scaly: Yes Maceration: No Moist: No Wound Preparation Ulcer Cleansing: Rinsed/Irrigated with Saline Topical Anesthetic Applied: Other: lidocaine 4%, Treatment Notes Wound #1 (Right, Lateral Lower Leg) 1. Cleansed with: Clean wound with Normal Saline 2. Anesthetic Topical Lidocaine 4% cream to wound bed prior to debridement 4. Dressing Applied: Santyl Ointment 5. Secondary Dressing Applied Bordered Foam Dressing Dry Gauze Electronic Signature(s) Signed: 10/01/2015 4:43:39 PM By: Alric Quan Entered By: Alric Quan on 09/28/2015 09:00:45 Kelli [name] ([id number]) -------------------------------------------------------------------------------- [place] Details Patient Name: Kelli [name] Date of Service: 09/28/2015 8:45 AM Medical Record Number: [id number] Patient Account Number: [id number] Date of Birth/Sex: 07/18/1961 (54 y.o. Female) Treating RN: Carolyne Fiscal, Debi Primary Care Physician: Other Clinician: Referring Physician: Orvilla Fus Treating Physician/Extender: Frann Rider in Treatment: 1 Vital Signs Time Taken: 08:53 Temperature (F): 97.7 Height (in): 67 Pulse (bpm): 91 Weight (lbs): 160 Respiratory Rate (breaths/min): 20 Body Mass Index (BMI): 25.1 Blood Pressure (mmHg): 97/71 Reference Range: 80 - 120 mg / dl Electronic Signature(s) Signed: 10/01/2015 4:43:39 PM By: Alric Quan Entered By: Alric Quan on 09/28/2015 08:55:13

## 2015-10-05 ENCOUNTER — Encounter: Payer: BLUE CROSS/BLUE SHIELD | Admitting: Surgery

## 2015-10-05 DIAGNOSIS — E11622 Type 2 diabetes mellitus with other skin ulcer: Secondary | ICD-10-CM | POA: Diagnosis not present

## 2015-10-06 NOTE — Progress Notes (Signed)
TIFFINAY, HELMUTH ([id number]) Visit Report for 10/05/2015 Arrival Information Details Patient Name: Kelli [name], Kelli [name]. Date of Service: 10/05/2015 10:30 AM Medical Record Number: [id number] Patient Account Number: [id number] Date of Birth/Sex: [birth year] (55 y.o. Female) Treating RN: Afful, RN, BSN, Velva Harman Primary Care Physician: Other Clinician: Referring Physician: Orvilla Fus Treating Physician/Extender: Frann Rider in Treatment: 2 Visit Information History Since Last Visit Added or deleted any medications: No Patient Arrived: Ambulatory Any new allergies or adverse reactions: No Arrival Time: 10:46 Had a fall or experienced change in No Accompanied By: self activities of daily living that may affect Transfer Assistance: None risk of falls: Patient Identification Verified: Yes Signs or symptoms of abuse/neglect since last No Secondary Verification Process Yes visito Completed: Hospitalized since last visit: No Patient Requires Transmission- No Has Dressing in Place as Prescribed: Yes Based Precautions: Pain Present Now: No Patient Has Alerts: Yes Patient Alerts: 1/26 ABI (L)1.09 (R)1.05 Electronic Signature(s) Signed: 10/05/2015 3:32:51 PM By: Regan Lemming BSN, RN Entered By: Regan Lemming on 10/05/2015 10:46:21 Kelli [name] ([id number]) -------------------------------------------------------------------------------- Encounter Discharge Information Details Patient Name: Kelli [name] Date of Service: 10/05/2015 10:30 AM Medical Record Number: [id number] Patient Account Number: [id number] Date of Birth/Sex: September 13, 1960 (55 y.o. Female) Treating RN: Afful, RN, BSN, Velva Harman Primary Care Physician: Other Clinician: Referring Physician: Orvilla Fus Treating Physician/Extender: Frann Rider in Treatment: 2 Encounter Discharge Information Items Discharge Pain Level: 0 Discharge Condition: Stable Ambulatory Status: Ambulatory Discharge Destination:  Home Transportation: Private Auto Accompanied By: self Schedule Follow-up Appointment: No Medication Reconciliation completed and provided to Patient/Care No Tymir Terral: Provided on Clinical Summary of Care: 10/05/2015 Form Type Recipient Paper Patient Drexel Signature(s) Signed: 10/05/2015 11:07:34 AM By: Ruthine Dose Entered By: Ruthine Dose on 10/05/2015 11:07:33 Kelli [name] ([id number]) -------------------------------------------------------------------------------- Lower Extremity Assessment Details Patient Name: Kelli [name] Date of Service: 10/05/2015 10:30 AM Medical Record Number: [id number] Patient Account Number: [id number] Date of Birth/Sex: 1961/02/02 (55 y.o. Female) Treating RN: Afful, RN, BSN, Velva Harman Primary Care Physician: Other Clinician: Referring Physician: Orvilla Fus Treating Physician/Extender: Frann Rider in Treatment: 2 Edema Assessment Assessed: [Left: No] [Right: No] Edema: [Left: N] [Right: o] Vascular Assessment Claudication: Claudication Assessment [Right:None] Pulses: Posterior Tibial Dorsalis Pedis Palpable: [Right:Yes] Extremity colors, hair growth, and conditions: Extremity Color: [Right:Normal] Hair Growth on Extremity: [Right:No] Temperature of Extremity: [Right:Warm] Capillary Refill: [Right:< 3 seconds] Toe Nail Assessment Left: Right: Thick: No Discolored: No Deformed: No Improper Length and Hygiene: No Electronic Signature(s) Signed: 10/05/2015 3:32:51 PM By: Regan Lemming BSN, RN Entered By: Regan Lemming on 10/05/2015 10:52:37 Kelli [name] ([id number]) -------------------------------------------------------------------------------- Multi Wound Chart Details Patient Name: Kelli [name] Date of Service: 10/05/2015 10:30 AM Medical Record Number: [id number] Patient Account Number: [id number] Date of Birth/Sex: November 17, 1960 (55 y.o. Female) Treating RN: Baruch Gouty, RN, BSN, Velva Harman Primary Care  Physician: Other Clinician: Referring Physician: Orvilla Fus Treating Physician/Extender: Frann Rider in Treatment: 2 Vital Signs Height(in): 67 Pulse(bpm): 85 Weight(lbs): 160 Blood Pressure 128/62 (mmHg): Body Mass Index(BMI): 25 Temperature(F): 97.8 Respiratory Rate 18 (breaths/min): Photos: [1:No Photos] [N/A:N/A] Wound Location: [1:Right Lower Leg - Lateral] [N/A:N/A] Wounding Event: [1:Gradually Appeared] [N/A:N/A] Primary Etiology: [1:To be determined] [N/A:N/A] Comorbid History: [1:Type II Diabetes, Neuropathy] [N/A:N/A] Date Acquired: [1:05/26/2015] [N/A:N/A] Weeks of Treatment: [1:2] [N/A:N/A] Wound Status: [1:Open] [N/A:N/A] Measurements L x W x D 1.3x1.2x0.1 [N/A:N/A] (cm) Area (cm) : [1:1.225] [N/A:N/A] Volume (cm) : [1:0.123] [N/A:N/A] % Reduction in Area: [1:30.70%] [N/A:N/A] % Reduction in Volume: 30.50% [N/A:N/A] Classification: [1:Partial Thickness] [N/A:N/A]  HBO Classification: [1:Grade 1] [N/A:N/A] Exudate Amount: [1:Small] [N/A:N/A] Exudate Type: [1:Serous] [N/A:N/A] Exudate Color: [1:amber] [N/A:N/A] Wound Margin: [1:Flat and Intact] [N/A:N/A] Granulation Amount: [1:Medium (34-66%)] [N/A:N/A] Granulation Quality: [1:Red] [N/A:N/A] Necrotic Amount: [1:Medium (34-66%)] [N/A:N/A] Exposed Structures: [1:Fascia: No Fat: No Tendon: No Muscle: No Joint: No Bone: No] [N/A:N/A] Limited to Skin Breakdown Epithelialization: Medium (34-66%) N/A N/A Periwound Skin Texture: Edema: No N/A N/A Excoriation: No Induration: No Callus: No Crepitus: No Fluctuance: No Friable: No Rash: No Scarring: No Periwound Skin Dry/Scaly: Yes N/A N/A Moisture: Maceration: No Moist: No Periwound Skin Color: Atrophie Blanche: No N/A N/A Cyanosis: No Ecchymosis: No Erythema: No Hemosiderin Staining: No Mottled: No Pallor: No Rubor: No Temperature: No Abnormality N/A N/A Tenderness on No N/A N/A Palpation: Wound Preparation: Ulcer Cleansing: N/A  N/A Rinsed/Irrigated with Saline Topical Anesthetic Applied: Other: lidocaine 4% Treatment Notes Electronic Signature(s) Signed: 10/05/2015 3:32:51 PM By: Regan Lemming BSN, RN Entered By: Regan Lemming on 10/05/2015 11:02:24 Kelli [name] ([id number]) -------------------------------------------------------------------------------- [place] Details Patient Name: Kelli [name] Date of Service: 10/05/2015 10:30 AM Medical Record Number: [id number] Patient Account Number: [id number] Date of Birth/Sex: [birth year] (54 y.o. Female) Treating RN: Afful, RN, BSN, Velva Harman Primary Care Physician: Other Clinician: Referring Physician: Orvilla Fus Treating Physician/Extender: Frann Rider in Treatment: 2 Active Inactive Orientation to the Wound Care Program Nursing Diagnoses: Knowledge deficit related to the wound healing center program Goals: Patient/caregiver will verbalize understanding of the [place] [facility] Date Initiated: 09/20/2015 Goal Status: Active Interventions: Provide education on orientation to the wound center Notes: Wound/Skin Impairment Nursing Diagnoses: Knowledge deficit related to ulceration/compromised skin integrity Goals: Patient/caregiver will verbalize understanding of skin care regimen Date Initiated: 09/20/2015 Goal Status: Active Ulcer/skin breakdown will have a volume reduction of 30% by week 4 Date Initiated: 09/20/2015 Goal Status: Active Ulcer/skin breakdown will have a volume reduction of 50% by week 8 Date Initiated: 09/20/2015 Goal Status: Active Ulcer/skin breakdown will have a volume reduction of 80% by week 12 Date Initiated: 09/20/2015 Goal Status: Active Ulcer/skin breakdown will heal within 14 weeks Date Initiated: 09/20/2015 Goal Status: Active DAYSHAWNA, MANSPEAKER ([id number]) Interventions: Assess patient/caregiver ability to obtain necessary supplies Assess patient/caregiver ability to perform  ulcer/skin care regimen upon admission and as needed Assess ulceration(s) every visit Provide education on ulcer and skin care Notes: Electronic Signature(s) Signed: 10/05/2015 3:32:51 PM By: Regan Lemming BSN, RN Entered By: Regan Lemming on 10/05/2015 11:02:15 Kelli [name] ([id number]) -------------------------------------------------------------------------------- Pain Assessment Details Patient Name: Kelli [name] Date of Service: 10/05/2015 10:30 AM Medical Record Number: [id number] Patient Account Number: [id number] Date of Birth/Sex: [birth year] (54 y.o. Female) Treating RN: Baruch Gouty, RN, BSN, Velva Harman Primary Care Physician: Other Clinician: Referring Physician: Orvilla Fus Treating Physician/Extender: Frann Rider in Treatment: 2 Active Problems Location of Pain Severity and Description of Pain Patient Has Paino No Site Locations Pain Management and Medication Current Pain Management: Electronic Signature(s) Signed: 10/05/2015 3:32:51 PM By: Regan Lemming BSN, RN Entered By: Regan Lemming on 10/05/2015 10:46:27 Kelli [name] ([id number]) -------------------------------------------------------------------------------- Patient/Caregiver Education Details Patient Name: Kelli [name] Date of Service: 10/05/2015 10:30 AM Medical Record Number: [id number] Patient Account Number: [id number] Date of Birth/Gender: [birth year] (54 y.o. Female) Treating RN: Baruch Gouty, RN, BSN, Velva Harman Primary Care Physician: Other Clinician: Referring Physician: Orvilla Fus Treating Physician/Extender: Frann Rider in Treatment: 2 Education Assessment Education Provided To: Patient Education Topics Provided Welcome To [facility]: Methods: Explain/Verbal Responses: State content correctly Wound/Skin Impairment: Methods: Explain/Verbal Electronic Signature(s)  Signed: 10/05/2015 3:32:51 PM By: Regan Lemming BSN, RN Entered By: Regan Lemming on 10/05/2015 11:04:54 Kelli [name] ([id number]) -------------------------------------------------------------------------------- Wound Assessment Details Patient Name: Kelli [name] Date of Service: 10/05/2015 10:30 AM Medical Record Number: [id number] Patient Account Number: [id number] Date of Birth/Sex: October 19, 1960 (54 y.o. Female) Treating RN: Baruch Gouty, RN, BSN, Velva Harman Primary Care Physician: Other Clinician: Referring Physician: Orvilla Fus Treating Physician/Extender: Frann Rider in Treatment: 2 Wound Status Wound Number: 1 Primary Etiology: To be determined Wound Location: Right Lower Leg - Lateral Wound Status: Open Wounding Event: Gradually Appeared Comorbid History: Type II Diabetes, Neuropathy Date Acquired: 05/26/2015 Weeks Of Treatment: 2 Clustered Wound: No Photos Photo Uploaded By: Regan Lemming on 10/05/2015 15:28:51 Wound Measurements Length: (cm) 1.3 Width: (cm) 1.2 Depth: (cm) 0.1 Area: (cm) 1.225 Volume: (cm) 0.123 % Reduction in Area: 30.7% % Reduction in Volume: 30.5% Epithelialization: Medium (34-66%) Tunneling: No Undermining: No Wound Description Classification: Partial Thickness Foul Odor A Diabetic Severity (Wagner): Grade 1 Wound Margin: Flat and Intact Exudate Amount: Small Exudate Type: Serous Exudate Color: amber fter Cleansing: No Wound Bed Granulation Amount: Medium (34-66%) Exposed Structure Granulation Quality: Red Fascia Exposed: No Necrotic Amount: Medium (34-66%) Fat Layer Exposed: No Demo, Patsie G. ([id number]) Necrotic Quality: Adherent Slough Tendon Exposed: No Muscle Exposed: No Joint Exposed: No Bone Exposed: No Limited to Skin Breakdown Periwound Skin Texture Texture Color No Abnormalities Noted: No No Abnormalities Noted: No Callus: No Atrophie Blanche: No Crepitus: No Cyanosis: No Excoriation: No Ecchymosis: No Fluctuance: No Erythema: No Friable: No Hemosiderin Staining: No Induration: No Mottled: No Localized Edema:  No Pallor: No Rash: No Rubor: No Scarring: No Temperature / Pain Moisture Temperature: No Abnormality No Abnormalities Noted: No Dry / Scaly: Yes Maceration: No Moist: No Wound Preparation Ulcer Cleansing: Rinsed/Irrigated with Saline Topical Anesthetic Applied: Other: lidocaine 4%, Electronic Signature(s) Signed: 10/05/2015 3:32:51 PM By: Regan Lemming BSN, RN Entered By: Regan Lemming on 10/05/2015 10:51:03 Kelli [name] ([id number]) -------------------------------------------------------------------------------- Vitals Details Patient Name: Kelli [name] Date of Service: 10/05/2015 10:30 AM Medical Record Number: [id number] Patient Account Number: [id number] Date of Birth/Sex: 08-15-1961 (54 y.o. Female) Treating RN: Afful, RN, BSN, Velva Harman Primary Care Physician: Other Clinician: Referring Physician: Orvilla Fus Treating Physician/Extender: Frann Rider in Treatment: 2 Vital Signs Time Taken: 10:48 Temperature (F): 97.8 Height (in): 67 Pulse (bpm): 85 Weight (lbs): 160 Respiratory Rate (breaths/min): 18 Body Mass Index (BMI): 25.1 Blood Pressure (mmHg): 128/62 Reference Range: 80 - 120 mg / dl Electronic Signature(s) Signed: 10/05/2015 3:32:51 PM By: Regan Lemming BSN, RN Entered By: Regan Lemming on 10/05/2015 10:49:19

## 2015-10-06 NOTE — Progress Notes (Addendum)
MELLY, BOWAR ([id number]) Visit Report for 10/05/2015 Chief Complaint Document Details Patient Name: Kelli [name], Kelli [name] 10/05/2015 10:30 Date of Service: AM Medical Record [id number] Number: Patient Account Number: [id number] [birth year] (55 y.o. Afful, RN, BSN, Date of Birth/Sex: Treating RN: Female) Administrator, sports Primary Care Physician: Other Clinician: Referring Physician: Orvilla Fus Treating Jenny Lai Physician/Extender: Weeks in Treatment: 2 Information Obtained from: Patient Chief Complaint Patients presents for treatment of an open diabetic ulcer to the right lower leg near her cough for about 3 months Electronic Signature(s) Signed: 10/05/2015 11:13:34 AM By: Christin Fudge MD, FACS Entered By: Christin Fudge on 10/05/2015 11:13:34 Kelli [name] ([id number]) -------------------------------------------------------------------------------- Debridement Details Patient Name: Kelli [name] 10/05/2015 10:30 Date of Service: AM Medical Record [id number] Number: Patient Account Number: [id number] [birth year] (55 y.o. Afful, RN, BSN, Date of Birth/Sex: Treating RN: Female) Administrator, sports Primary Care Physician: Other Clinician: Referring Physician: Orvilla Fus Treating Aniah Pauli Physician/Extender: Weeks in Treatment: 2 Debridement Performed for Wound #1 Right,Lateral Lower Leg Assessment: Performed By: Physician Christin Fudge, MD Debridement: Debridement Pre-procedure Yes Verification/Time Out Taken: Start Time: 11:00 Pain Control: Lidocaine 4% Topical Solution Level: Skin/Subcutaneous Tissue Total Area Debrided (L x 1.3 (cm) x 1.2 (cm) = 1.56 (cm) W): Tissue and other Non-Viable, Fibrin/Slough, Subcutaneous material debrided: Instrument: Curette Bleeding: Minimum Hemostasis Achieved: Pressure End Time: 11:03 Procedural Pain: 0 Post Procedural Pain: 0 Response to Treatment: Procedure was tolerated well Post Debridement Measurements of Total  Wound Length: (cm) 1.3 Width: (cm) 1.2 Depth: (cm) 0.2 Volume: (cm) 0.245 Post Procedure Diagnosis Same as Pre-procedure Electronic Signature(s) Signed: 10/05/2015 11:13:28 AM By: Christin Fudge MD, FACS Signed: 10/05/2015 3:32:51 PM By: Regan Lemming BSN, RN Entered By: Christin Fudge on 10/05/2015 11:13:28 Kelli [name] ([id number]) -------------------------------------------------------------------------------- HPI Details Patient Name: Kelli [name] 10/05/2015 10:30 Date of Service: AM Medical Record [id number] Number: Patient Account Number: [id number] Nov 12, 1960 (55 y.o. Afful, RN, BSN, Date of Birth/Sex: Treating RN: Female) Administrator, sports Primary Care Physician: Other Clinician: Referring Physician: Orvilla Fus Treating Kyeisha Janowicz Physician/Extender: Weeks in Treatment: 2 History of Present Illness Location: right lateral calf region Quality: Patient reports No Pain. Severity: Patient states wound are getting worse. Duration: Patient has had the wound for > 3 months prior to seeking treatment at the wound center Context: The wound appeared gradually over time Modifying Factors: Other treatment(s) tried include:she has seen a dermatologist and her PCP and has had 3 rounds of antibiotics in the last 3 months Associated Signs and Symptoms: Patient reports having increase swelling. HPI Description: 55 year old patient referred to as by her PCP Dr. Orvilla Fus, for a wound on her right lower calf which has been there for about 3 months. She has received antibiotics on 3 different occasions for this. Her past medical history is significant for diabetes mellitus type 2, depression and migraine. She has not had any surgery. She was known to be a smoker and has quit smoking 30 years ago. she was told she had a staph infection and this is possibly by a culture done by the dermatologist. I do not believe a skin biopsy has been done. Electronic Signature(s) Signed:  10/19/2015 10:08:17 AM By: Christin Fudge MD, FACS Previous Signature: 10/19/2015 10:07:57 AM Version By: Christin Fudge MD, FACS Previous Signature: 10/05/2015 11:13:39 AM Version By: Christin Fudge MD, FACS Entered By: Christin Fudge on 10/19/2015 10:08:17 Kelli [name] ([id number]) -------------------------------------------------------------------------------- Physical Exam Details Patient Name: Kelli [name], Kelli [name] 10/05/2015 10:30 Date of Service: AM Medical Record [id number] Number: Patient Account Number: [id number]  1961/04/03 (55 y.o. Afful, RN, BSN, Date of Birth/Sex: Treating RN: Female) Administrator, sports Primary Care Physician: Other Clinician: Referring Physician: Orvilla Fus Treating Adely Facer Physician/Extender: Weeks in Treatment: 2 Constitutional . Pulse regular. Respirations normal and unlabored. Afebrile. . Eyes Nonicteric. Reactive to light. Ears, Nose, Mouth, and Throat Lips, teeth, and gums WNL.Marland Kitchen Moist mucosa without lesions. Neck supple and nontender. No palpable supraclavicular or cervical adenopathy. Normal sized without goiter. Respiratory WNL. No retractions.. Cardiovascular Pedal Pulses WNL. No clubbing, cyanosis or edema. Lymphatic No adneopathy. No adenopathy. No adenopathy. Musculoskeletal Adexa without tenderness or enlargement.. Digits and nails w/o clubbing, cyanosis, infection, petechiae, ischemia, or inflammatory conditions.. Integumentary (Hair, Skin) No suspicious lesions. No crepitus or fluctuance. No peri-wound warmth or erythema. No masses.Marland Kitchen Psychiatric Judgement and insight Intact.. No evidence of depression, anxiety, or agitation.. Notes the ulceration on the right lower extremity has less necrotic debris but the superior part has some and this was sharply debrided with a curette. There is healthy granulation tissue under this and minimal bleeding was controlled with pressure. Electronic Signature(s) Signed: 10/05/2015 11:14:19 AM By:  Christin Fudge MD, FACS Entered By: Christin Fudge on 10/05/2015 11:14:19 Kelli [name] ([id number]) -------------------------------------------------------------------------------- Physician Orders Details Patient Name: Kelli [name], Kelli [name] 10/05/2015 10:30 Date of Service: AM Medical Record [id number] Number: Patient Account Number: [id number] 1961/03/23 (55 y.o. Afful, RN, BSN, Date of Birth/Sex: Treating RN: Female) Administrator, sports Primary Care Physician: Other Clinician: Referring Physician: Orvilla Fus Treating Jamilah Jean Physician/Extender: Weeks in Treatment: 2 Verbal / Phone Orders: Yes Clinician: Afful, RN, BSN, Rita Read Back and Verified: Yes Diagnosis Coding Wound Cleansing Wound #1 Right,Lateral Lower Leg o Cleanse wound with mild soap and water Primary Wound Dressing Wound #1 Right,Lateral Lower Leg o Santyl Ointment Secondary Dressing Wound #1 Right,Lateral Lower Leg o Dry Gauze o Boardered Foam Dressing Dressing Change Frequency Wound #1 Right,Lateral Lower Leg o Change dressing every day. Follow-up Appointments Wound #1 Right,Lateral Lower Leg o Return Appointment in 1 week. Electronic Signature(s) Signed: 10/05/2015 3:32:51 PM By: Regan Lemming BSN, RN Signed: 10/05/2015 3:57:16 PM By: Christin Fudge MD, FACS Entered By: Regan Lemming on 10/05/2015 11:02:56 RIKKI-LEE, CORCORAN ([id number]) -------------------------------------------------------------------------------- Prescription 10/05/2015 Patient Name: Kelli [name] Physician: Christin Fudge MD Date of Birth: [birth year] NPI#: [id number] Sex: F DEA#: [id number] Phone #: [phone] License #: Patient Address: [place] [facility] [place] [address] [place], [place] [address] [facility] [address], [place], [place] [address] [phone] Allergies penicillin Reaction: nausea, vomiting Physician's Orders Santyl  Ointment Signature(s): Date(s): Electronic Signature(s) Signed: 10/05/2015 3:32:51 PM By: Regan Lemming BSN, RN Signed: 10/05/2015 3:57:16 PM By: Christin Fudge MD, FACS Entered By: Regan Lemming on 10/05/2015 11:02:56 Kelli [name] ([id number]) --------------------------------------------------------------------------------  Problem List Details Patient Name: Kelli [name], Kelli [name] 10/05/2015 10:30 Date of Service: AM Medical Record [id number] Number: Patient Account Number: [id number] 16-Mar-1961 (55 y.o. Afful, RN, BSN, Date of Birth/Sex: Treating RN: Female) Administrator, sports Primary Care Physician: Other Clinician: Referring Physician: Orvilla Fus Treating Alton Bouknight Physician/Extender: Weeks in Treatment: 2 Active Problems ICD-10 Encounter Code Description Active Date Diagnosis E11.622 Type 2 diabetes mellitus with other skin ulcer 09/20/2015 Yes L97.212 Non-pressure chronic ulcer of right calf with fat layer 09/20/2015 Yes exposed Inactive Problems Resolved Problems Electronic Signature(s) Signed: 10/05/2015 11:13:21 AM By: Christin Fudge MD, FACS Entered By: Christin Fudge on 10/05/2015 11:13:21 Kelli [name] ([id number]) -------------------------------------------------------------------------------- Progress Note Details Patient Name: Kelli [name] 10/05/2015 10:30 Date of Service: AM Medical Record [id number] Number:  Patient Account Number: [id number] 11-21-1960 (54 y.o. Afful, RN, BSN, Date of Birth/Sex: Treating RN: Female) Administrator, sports Primary Care Physician: Other Clinician: Referring Physician: Orvilla Fus Treating Florice Hindle Physician/Extender: Weeks in Treatment: 2 Subjective Chief Complaint Information obtained from Patient Patients presents for treatment of an open diabetic ulcer to the right lower leg near her cough for about 3 months History of Present Illness (HPI) The following HPI elements were documented for the patient's wound: Location: right  lateral calf region Quality: Patient reports No Pain. Severity: Patient states wound are getting worse. Duration: Patient has had the wound for > 3 months prior to seeking treatment at the wound center Context: The wound appeared gradually over time Modifying Factors: Other treatment(s) tried include:she has seen a dermatologist and her PCP and has had 3 rounds of antibiotics in the last 3 months Associated Signs and Symptoms: Patient reports having increase swelling. 55 year old patient referred to as by her PCP Dr. Orvilla Fus, for a wound on her right lower calf which has been there for about 3 months. She has received antibiotics on 3 different occasions for this. Her past medical history is significant for diabetes mellitus type 2, depression and migraine. She has not had any surgery. She was known to be a smoker and has quit smoking 30 years ago. she was told she had a staph infection and this is possibly by a culture done by the dermatologist. I do not believe a skin biopsy has been done. Objective Constitutional Pulse regular. Respirations normal and unlabored. Afebrile. Vitals Time Taken: 10:48 AM, Height: 67 in, Weight: 160 lbs, BMI: 25.1, Temperature: 97.8 F, Pulse: 85 bpm, Respiratory Rate: 18 breaths/min, Blood Pressure: 128/62 mmHg. Kelli [name], Kelli [name] ([id number]) Eyes Nonicteric. Reactive to light. Ears, Nose, Mouth, and Throat Lips, teeth, and gums WNL.Marland Kitchen Moist mucosa without lesions. Neck supple and nontender. No palpable supraclavicular or cervical adenopathy. Normal sized without goiter. Respiratory WNL. No retractions.. Cardiovascular Pedal Pulses WNL. No clubbing, cyanosis or edema. Lymphatic No adneopathy. No adenopathy. No adenopathy. Musculoskeletal Adexa without tenderness or enlargement.. Digits and nails w/o clubbing, cyanosis, infection, petechiae, ischemia, or inflammatory conditions.Marland Kitchen Psychiatric Judgement and insight Intact.. No evidence of  depression, anxiety, or agitation.. General Notes: the ulceration on the right lower extremity has less necrotic debris but the superior part has some and this was sharply debrided with a curette. There is healthy granulation tissue under this and minimal bleeding was controlled with pressure. Integumentary (Hair, Skin) No suspicious lesions. No crepitus or fluctuance. No peri-wound warmth or erythema. No masses.. Wound #1 status is Open. Original cause of wound was Gradually Appeared. The wound is located on the Right,Lateral Lower Leg. The wound measures 1.3cm length x 1.2cm width x 0.1cm depth; 1.225cm^2 area and 0.123cm^3 volume. The wound is limited to skin breakdown. There is no tunneling or undermining noted. There is a small amount of serous drainage noted. The wound margin is flat and intact. There is medium (34-66%) red granulation within the wound bed. There is a medium (34-66%) amount of necrotic tissue within the wound bed including Adherent Slough. The periwound skin appearance exhibited: Dry/Scaly. The periwound skin appearance did not exhibit: Callus, Crepitus, Excoriation, Fluctuance, Friable, Induration, Localized Edema, Rash, Scarring, Maceration, Moist, Atrophie Blanche, Cyanosis, Ecchymosis, Hemosiderin Staining, Mottled, Pallor, Rubor, Erythema. Periwound temperature was noted as No Abnormality. Assessment Kelli [name], Kelli [name] ([id number]) Active Problems ICD-10 E11.622 - Type 2 diabetes mellitus with other skin ulcer L97.212 - Non-pressure chronic ulcer of right calf with fat layer  exposed Procedures Wound #1 Wound #1 is an Atypical located on the Right,Lateral Lower Leg . There was a Skin/Subcutaneous Tissue Debridement [id number]) debridement with total area of 1.56 sq cm performed by Christin Fudge, MD. with the following instrument(s): Curette to remove Non-Viable tissue/material including Fibrin/Slough and Subcutaneous after achieving pain control using Lidocaine 4%  Topical Solution. A time out was conducted prior to the start of the procedure. A Minimum amount of bleeding was controlled with Pressure. The procedure was tolerated well with a pain level of 0 throughout and a pain level of 0 following the procedure. Post Debridement Measurements: 1.3cm length x 1.2cm width x 0.2cm depth; 0.245cm^3 volume. Post procedure Diagnosis Wound #1: Same as Pre-Procedure Plan Wound Cleansing: Wound #1 Right,Lateral Lower Leg: Cleanse wound with mild soap and water Primary Wound Dressing: Wound #1 Right,Lateral Lower Leg: Santyl Ointment Secondary Dressing: Wound #1 Right,Lateral Lower Leg: Dry Gauze Boardered Foam Dressing Dressing Change Frequency: Wound #1 Right,Lateral Lower Leg: Change dressing every day. Follow-up Appointments: Wound #1 Right,Lateral Lower Leg: Return Appointment in 1 week. Kelli [name], Kelli [name] ([id number]) After debriding I have recommended Santyl ointment to be applied on a daily basis and have discussed with the possibility of a skin biopsy at a later stage if this does not improve appropriately. we will deferthe need a venous duplex study, and doing it only if the problem persist. All her questions have been answered and she will see me back next week Electronic Signature(s) Signed: 10/19/2015 5:02:38 PM By: Christin Fudge MD, FACS Previous Signature: 10/05/2015 11:15:05 AM Version By: Christin Fudge MD, FACS Entered By: Christin Fudge on 10/19/2015 17:02:38 Kelli [name] ([id number]) -------------------------------------------------------------------------------- SuperBill Details Patient Name: Kelli [name] Date of Service: 10/05/2015 Medical Record Patient Account Number: [id number] Number: Afful, RN, BSN, Treating RN: 1961-08-05 (55 y.o. 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

## 2015-10-12 ENCOUNTER — Ambulatory Visit: Payer: BLUE CROSS/BLUE SHIELD | Admitting: General Surgery

## 2015-10-19 ENCOUNTER — Encounter: Payer: BLUE CROSS/BLUE SHIELD | Admitting: Surgery

## 2015-10-19 DIAGNOSIS — E11622 Type 2 diabetes mellitus with other skin ulcer: Secondary | ICD-10-CM | POA: Diagnosis not present

## 2015-10-20 NOTE — Progress Notes (Addendum)
VERYLE, DETHLOFF ([id number]) Visit Report for 10/19/2015 Arrival Information Details Patient Name: Kelli [name], Kelli [name]. Date of Service: 10/19/2015 10:00 AM Medical Record Number: [id number] Patient Account Number: [id number] Date of Birth/Sex: 06/05/1961 (55 y.o. Female) Treating RN: Afful, RN, BSN, Velva Harman Primary Care Physician: Other Clinician: Referring Physician: Orvilla Fus Treating Physician/Extender: Frann Rider in Treatment: 4 Visit Information History Since Last Visit Added or deleted any medications: No Patient Arrived: Ambulatory Any new allergies or adverse reactions: No Arrival Time: 10:07 Had a fall or experienced change in No Accompanied By: self activities of daily living that may affect Transfer Assistance: None risk of falls: Patient Identification Verified: Yes Signs or symptoms of abuse/neglect since last No Secondary Verification Process Yes visito Completed: Hospitalized since last visit: No Patient Requires Transmission- No Has Dressing in Place as Prescribed: Yes Based Precautions: Pain Present Now: No Patient Has Alerts: Yes Patient Alerts: 1/26 ABI (L)1.09 (R)1.05 Electronic Signature(s) Signed: 10/19/2015 5:01:36 PM By: Regan Lemming BSN, RN Previous Signature: 10/19/2015 10:07:54 AM Version By: Regan Lemming BSN, RN Entered By: Regan Lemming on 10/19/2015 10:23:48 Kelli [name] ([id number]) -------------------------------------------------------------------------------- Clinic Level of Care Assessment Details Patient Name: Kelli [name] Date of Service: 10/19/2015 10:00 AM Medical Record Number: [id number] Patient Account Number: [id number] Date of Birth/Sex: 03/06/1961 (55 y.o. Female) Treating RN: Afful, RN, BSN, Administrator, sports Primary Care Physician: Other Clinician: Referring Physician: Orvilla Fus Treating Physician/Extender: Frann Rider in Treatment: 4 Clinic Level of Care Assessment Items TOOL 4 Quantity Score []  - Use when  only an EandM is performed on FOLLOW-UP visit 0 ASSESSMENTS - Nursing Assessment / Reassessment X - Reassessment of Co-morbidities (includes updates in patient status) 1 10 X - Reassessment of Adherence to Treatment Plan 1 5 ASSESSMENTS - Wound and Skin Assessment / Reassessment X - Simple Wound Assessment / Reassessment - one wound 1 5 []  - Complex Wound Assessment / Reassessment - multiple wounds 0 []  - Dermatologic / Skin Assessment (not related to wound area) 0 ASSESSMENTS - Focused Assessment []  - Circumferential Edema Measurements - multi extremities 0 []  - Nutritional Assessment / Counseling / Intervention 0 X - Lower Extremity Assessment (monofilament, tuning fork, pulses) 1 5 []  - Peripheral Arterial Disease Assessment (using hand held doppler) 0 ASSESSMENTS - Ostomy and/or Continence Assessment and Care []  - Incontinence Assessment and Management 0 []  - Ostomy Care Assessment and Management (repouching, etc.) 0 PROCESS - Coordination of Care X - Simple Patient / Family Education for ongoing care 1 15 []  - Complex (extensive) Patient / Family Education for ongoing care 0 []  - Staff obtains Programmer, systems, Records, Test Results / Process Orders 0 []  - Staff telephones HHA, Nursing Homes / Clarify orders / etc 0 []  - Routine Transfer to another Facility (non-emergent condition) 0 [name], [name] ([id number]) []  - Routine Hospital Admission (non-emergent condition) 0 []  - New Admissions / Biomedical engineer / Ordering NPWT, Apligraf, etc. 0 []  - Emergency Hospital Admission (emergent condition) 0 []  - Simple Discharge Coordination 0 []  - Complex (extensive) Discharge Coordination 0 PROCESS - Special Needs []  - Pediatric / Minor Patient Management 0 []  - Isolation Patient Management 0 []  - Hearing / Language / Visual special needs 0 []  - Assessment of Community assistance (transportation, D/C planning, etc.) 0 []  - Additional assistance / Altered mentation 0 []  - Support  Surface(s) Assessment (bed, cushion, seat, etc.) 0 INTERVENTIONS - Wound Cleansing / Measurement []  - Simple Wound Cleansing - one wound 0 []  - Complex Wound Cleansing -  multiple wounds 0 X - Wound Imaging (photographs - any number of wounds) 1 5 []  - Wound Tracing (instead of photographs) 0 []  - Simple Wound Measurement - one wound 0 []  - Complex Wound Measurement - multiple wounds 0 INTERVENTIONS - Wound Dressings []  - Small Wound Dressing one or multiple wounds 0 []  - Medium Wound Dressing one or multiple wounds 0 []  - Large Wound Dressing one or multiple wounds 0 []  - Application of Medications - topical 0 []  - Application of Medications - injection 0 INTERVENTIONS - Miscellaneous []  - External ear exam 0 [name], [name] ([id number]) []  - Specimen Collection (cultures, biopsies, blood, body fluids, etc.) 0 []  - Specimen(s) / Culture(s) sent or taken to Lab for analysis 0 []  - Patient Transfer (multiple staff / Harrel Lemon Lift / Similar devices) 0 []  - Simple Staple / Suture removal (25 or less) 0 []  - Complex Staple / Suture removal (26 or more) 0 []  - Hypo / Hyperglycemic Management (close monitor of Blood Glucose) 0 []  - Ankle / Brachial Index (ABI) - do not check if billed separately 0 []  - Vital Signs 0 Has the patient been seen at the hospital within the last three years: Yes Total Score: 45 Level Of Care: New/Established - Level 2 Electronic Signature(s) Signed: 10/19/2015 11:09:26 AM By: Regan Lemming BSN, RN Entered By: Regan Lemming on 10/19/2015 11:09:24 Kelli [name] ([id number]) -------------------------------------------------------------------------------- Encounter Discharge Information Details Patient Name: Kelli [name] Date of Service: 10/19/2015 10:00 AM Medical Record Number: [id number] Patient Account Number: [id number] Date of Birth/Sex: [birth year] (55 y.o. Female) Treating RN: Afful, RN, BSN, Velva Harman Primary Care Physician: Other Clinician: Referring  Physician: Orvilla Fus Treating Physician/Extender: Benjaman Pott in Treatment: 4 Encounter Discharge Information Items Discharge Pain Level: 0 Discharge Condition: Stable Ambulatory Status: Ambulatory Discharge Destination: Home Transportation: Private Auto Accompanied By: self Schedule Follow-up Appointment: No Medication Reconciliation completed and provided to Patient/Care No Kelli [name]: Provided on Clinical Summary of Care: 10/19/2015 Form Type Recipient Paper Patient [facility] Electronic Signature(s) Signed: 10/19/2015 5:01:36 PM By: Regan Lemming BSN, RN Previous Signature: 10/19/2015 10:22:37 AM Version By: Ruthine Dose Entered By: Regan Lemming on 10/19/2015 10:23:17 Kelli [name] ([id number]) -------------------------------------------------------------------------------- General Visit Notes Details Patient Name: Kelli [name] Date of Service: 10/19/2015 10:00 AM Medical Record Number: [id number] Patient Account Number: [id number] Date of Birth/Sex: 1961-08-07 (54 y.o. Female) Treating RN: Baruch Gouty, RN, BSN, Velva Harman Primary Care Physician: Other Clinician: Referring Physician: Orvilla Fus Treating Physician/Extender: Frann Rider in Treatment: 4 Notes BFD applied to newly epitheliazed area for protection. Electronic Signature(s) Signed: 10/19/2015 11:29:06 AM By: Regan Lemming BSN, RN Entered By: Regan Lemming on 10/19/2015 11:29:06 Kelli [name] ([id number]) -------------------------------------------------------------------------------- Lower Extremity Assessment Details Patient Name: Kelli [name] Date of Service: 10/19/2015 10:00 AM Medical Record Number: [id number] Patient Account Number: [id number] Date of Birth/Sex: June 18, 1961 (54 y.o. Female) Treating RN: Afful, RN, BSN, Velva Harman Primary Care Physician: Other Clinician: Referring Physician: Orvilla Fus Treating Physician/Extender: Frann Rider in Treatment: 4 Vascular  Assessment Pulses: Posterior Tibial Dorsalis Pedis Palpable: [Right:Yes] Extremity colors, hair growth, and conditions: Extremity Color: [Right:Normal] Hair Growth on Extremity: [Right:No] Temperature of Extremity: [Right:Warm] Capillary Refill: [Right:< 3 seconds] Toe Nail Assessment Left: Right: Thick: No Discolored: No Deformed: No Improper Length and Hygiene: No Electronic Signature(s) Signed: 10/19/2015 5:01:36 PM By: Regan Lemming BSN, RN Entered By: Regan Lemming on 10/19/2015 10:24:08 Kelli [name] ([id number]) -------------------------------------------------------------------------------- Multi Wound Chart Details Patient Name: Kelli [name] Date of Service: 10/19/2015  10:00 AM Medical Record Number: [id number] Patient Account Number: [id number] Date of Birth/Sex: 07/30/1961 (54 y.o. Female) Treating RN: Baruch Gouty, RN, BSN, Velva Harman Primary Care Physician: Other Clinician: Referring Physician: Orvilla Fus Treating Physician/Extender: Frann Rider in Treatment: 4 Vital Signs Height(in): 67 Pulse(bpm): 92 Weight(lbs): 160 Blood Pressure 125/66 (mmHg): Body Mass Index(BMI): 25 Temperature(F): 97.9 Respiratory Rate 18 (breaths/min): Photos: [1:No Photos] [N/A:N/A] Wound Location: [1:Right Lower Leg - Lateral] [N/A:N/A] Wounding Event: [1:Gradually Appeared] [N/A:N/A] Primary Etiology: [1:Atypical] [N/A:N/A] Comorbid History: [1:Type II Diabetes, Neuropathy] [N/A:N/A] Date Acquired: [1:05/26/2015] [N/A:N/A] Weeks of Treatment: [1:4] [N/A:N/A] Wound Status: [1:Open] [N/A:N/A] Measurements L x W x D 0x0x0 [N/A:N/A] (cm) Area (cm) : [1:0] [N/A:N/A] Volume (cm) : [1:0] [N/A:N/A] % Reduction in Area: [1:100.00%] [N/A:N/A] % Reduction in Volume: 100.00% [N/A:N/A] Classification: [1:Partial Thickness] [N/A:N/A] HBO Classification: [1:Grade 1] [N/A:N/A] Exudate Amount: [1:None Present] [N/A:N/A] Wound Margin: [1:Flat and Intact] [N/A:N/A] Granulation  Amount: [1:None Present (0%)] [N/A:N/A] Necrotic Amount: [1:None Present (0%)] [N/A:N/A] Exposed Structures: [1:Fascia: No Fat: No Tendon: No Muscle: No Joint: No Bone: No Limited to Skin Breakdown] [N/A:N/A] Epithelialization: [1:Large (67-100%)] [N/A:N/A] Periwound Skin Texture: Edema: No N/A N/A Excoriation: No Induration: No Callus: No Crepitus: No Fluctuance: No Friable: No Rash: No Scarring: No Periwound Skin Dry/Scaly: Yes N/A N/A Moisture: Maceration: No Moist: No Periwound Skin Color: Atrophie Blanche: No N/A N/A Cyanosis: No Ecchymosis: No Erythema: No Hemosiderin Staining: No Mottled: No Pallor: No Rubor: No Temperature: No Abnormality N/A N/A Tenderness on No N/A N/A Palpation: Wound Preparation: Ulcer Cleansing: N/A N/A Rinsed/Irrigated with Saline Topical Anesthetic Applied: None Treatment Notes Electronic Signature(s) Signed: 10/19/2015 11:07:54 AM By: Regan Lemming BSN, RN Previous Signature: 10/19/2015 10:16:25 AM Version By: Regan Lemming BSN, RN Entered By: Regan Lemming on 10/19/2015 11:07:54 Kelli [name] ([id number]) -------------------------------------------------------------------------------- [place] Details Patient Name: Kelli [name] Date of Service: 10/19/2015 10:00 AM Medical Record Number: [id number] Patient Account Number: [id number] Date of Birth/Sex: 12/23/1960 (54 y.o. Female) Treating RN: Baruch Gouty, RN, BSN, Velva Harman Primary Care Physician: Other Clinician: Referring Physician: Orvilla Fus Treating Physician/Extender: Frann Rider in Treatment: 4 Active Inactive Electronic Signature(s) Signed: 10/19/2015 11:07:36 AM By: Regan Lemming BSN, RN Previous Signature: 10/19/2015 11:07:22 AM Version By: Regan Lemming BSN, RN Previous Signature: 10/19/2015 10:16:18 AM Version By: Regan Lemming BSN, RN Entered By: Regan Lemming on 10/19/2015 11:07:35 Kelli [name]  ([id number]) -------------------------------------------------------------------------------- Pain Assessment Details Patient Name: Kelli [name] Date of Service: 10/19/2015 10:00 AM Medical Record Number: [id number] Patient Account Number: [id number] Date of Birth/Sex: 12-03-1960 (54 y.o. Female) Treating RN: Baruch Gouty, RN, BSN, Velva Harman Primary Care Physician: Other Clinician: Referring Physician: Orvilla Fus Treating Physician/Extender: Frann Rider in Treatment: 4 Active Problems Location of Pain Severity and Description of Pain Patient Has Paino No Site Locations Pain Management and Medication Current Pain Management: Electronic Signature(s) Signed: 10/19/2015 10:08:03 AM By: Regan Lemming BSN, RN Entered By: Regan Lemming on 10/19/2015 10:08:02 Kelli [name] ([id number]) -------------------------------------------------------------------------------- Patient/Caregiver Education Details Patient Name: Kelli [name] Date of Service: 10/19/2015 10:00 AM Medical Record Number: [id number] Patient Account Number: [id number] Date of Birth/Gender: 1960/12/27 (54 y.o. Female) Treating RN: Baruch Gouty, RN, BSN, Velva Harman Primary Care Physician: Other Clinician: Referring Physician: Orvilla Fus Treating Physician/Extender: Benjaman Pott in Treatment: 4 Education Assessment Education Provided To: Patient Education Topics Provided Wound/Skin Impairment: Methods: Explain/Verbal Responses: State content correctly Electronic Signature(s) Signed: 10/19/2015 5:01:36 PM By: Regan Lemming BSN, RN Entered By: Regan Lemming on 10/19/2015 10:23:29 Kelli [name] ([id number]) -------------------------------------------------------------------------------- Wound Assessment  Details Patient Name: Kelli [name], Kelli [name]. Date of Service: 10/19/2015 10:00 AM Medical Record Number: [id number] Patient Account Number: [id number] Date of Birth/Sex: [birth year] (54 y.o. Female) Treating RN: Afful, RN,  BSN, Administrator, sports Primary Care Physician: Other Clinician: Referring Physician: Orvilla Fus Treating Physician/Extender: Benjaman Pott in Treatment: 4 Wound Status Wound Number: 1 Primary Etiology: Atypical Wound Location: Right Lower Leg - Lateral Wound Status: Open Wounding Event: Gradually Appeared Comorbid History: Type II Diabetes, Neuropathy Date Acquired: 05/26/2015 Weeks Of Treatment: 4 Clustered Wound: No Photos Photo Uploaded By: Regan Lemming on 10/19/2015 16:59:10 Wound Measurements Length: (cm) 0 % Reduction Width: (cm) 0 % Reduction Depth: (cm) 0 Epitheliali Area: (cm) 0 Tunneling: Volume: (cm) 0 Underminin in Area: 100% in Volume: 100% zation: Large (67-100%) No g: No Wound Description Classification: Partial Thickness Foul Odor A Diabetic Severity (Wagner): Grade 1 Wound Margin: Flat and Intact Exudate Amount: None Present fter Cleansing: No Wound Bed Granulation Amount: None Present (0%) Exposed Structure Necrotic Amount: None Present (0%) Fascia Exposed: No Fat Layer Exposed: No Tendon Exposed: No Muscle Exposed: No [name], [name] ([id number]) Joint Exposed: No Bone Exposed: No Limited to Skin Breakdown Periwound Skin Texture Texture Color No Abnormalities Noted: No No Abnormalities Noted: No Callus: No Atrophie Blanche: No Crepitus: No Cyanosis: No Excoriation: No Ecchymosis: No Fluctuance: No Erythema: No Friable: No Hemosiderin Staining: No Induration: No Mottled: No Localized Edema: No Pallor: No Rash: No Rubor: No Scarring: No Temperature / Pain Moisture Temperature: No Abnormality No Abnormalities Noted: No Dry / Scaly: Yes Maceration: No Moist: No Wound Preparation Ulcer Cleansing: Rinsed/Irrigated with Saline Topical Anesthetic Applied: None Electronic Signature(s) Signed: 10/19/2015 5:01:36 PM By: Regan Lemming BSN, RN Entered By: Regan Lemming on 10/19/2015 10:20:17 Kelli [name]  ([id number]) -------------------------------------------------------------------------------- Vitals Details Patient Name: Kelli [name] Date of Service: 10/19/2015 10:00 AM Medical Record Number: [id number] Patient Account Number: [id number] Date of Birth/Sex: 07/09/1961 (54 y.o. Female) Treating RN: Afful, RN, BSN, Velva Harman Primary Care Physician: Other Clinician: Referring Physician: Orvilla Fus Treating Physician/Extender: Frann Rider in Treatment: 4 Vital Signs Time Taken: 10:10 Temperature (F): 97.9 Height (in): 67 Pulse (bpm): 92 Weight (lbs): 160 Respiratory Rate (breaths/min): 18 Body Mass Index (BMI): 25.1 Blood Pressure (mmHg): 125/66 Reference Range: 80 - 120 mg / dl Electronic Signature(s) Signed: 10/19/2015 5:01:36 PM By: Regan Lemming BSN, RN Entered By: Regan Lemming on 10/19/2015 10:23:59

## 2016-03-20 ENCOUNTER — Encounter: Payer: BLUE CROSS/BLUE SHIELD | Attending: Surgery | Admitting: Surgery

## 2016-03-20 DIAGNOSIS — Z794 Long term (current) use of insulin: Secondary | ICD-10-CM | POA: Insufficient documentation

## 2016-03-20 DIAGNOSIS — E1140 Type 2 diabetes mellitus with diabetic neuropathy, unspecified: Secondary | ICD-10-CM | POA: Insufficient documentation

## 2016-03-20 DIAGNOSIS — E11622 Type 2 diabetes mellitus with other skin ulcer: Secondary | ICD-10-CM | POA: Insufficient documentation

## 2016-03-20 DIAGNOSIS — Z79899 Other long term (current) drug therapy: Secondary | ICD-10-CM | POA: Diagnosis not present

## 2016-03-20 DIAGNOSIS — L97212 Non-pressure chronic ulcer of right calf with fat layer exposed: Secondary | ICD-10-CM | POA: Insufficient documentation

## 2016-03-20 DIAGNOSIS — Z87891 Personal history of nicotine dependence: Secondary | ICD-10-CM | POA: Insufficient documentation

## 2016-03-20 DIAGNOSIS — Z7982 Long term (current) use of aspirin: Secondary | ICD-10-CM | POA: Diagnosis not present

## 2016-03-20 DIAGNOSIS — L309 Dermatitis, unspecified: Secondary | ICD-10-CM | POA: Diagnosis not present

## 2016-03-20 DIAGNOSIS — L97211 Non-pressure chronic ulcer of right calf limited to breakdown of skin: Secondary | ICD-10-CM | POA: Diagnosis not present

## 2016-03-20 DIAGNOSIS — Z880 Allergy status to penicillin: Secondary | ICD-10-CM | POA: Diagnosis not present

## 2016-03-20 DIAGNOSIS — F329 Major depressive disorder, single episode, unspecified: Secondary | ICD-10-CM | POA: Diagnosis not present

## 2016-03-21 DIAGNOSIS — L281 Prurigo nodularis: Secondary | ICD-10-CM | POA: Diagnosis not present

## 2016-03-21 NOTE — Progress Notes (Signed)
Kelli [name], Kelli [name] ([id number]) Visit Report for 03/20/2016 Abuse/Suicide Risk Screen Details Patient Name: Kelli [name], Kelli [name] 03/20/2016 8:00 Date of Service: AM Medical Record [id number] Number: Patient Account Number: [id number] 12/26/1960 (54 y.o. Treating RN: Montey Hora Date of Birth/Sex: Female) Other Clinician: Primary Care Physician: SPENCER, NICOLE Treating Britto, Errol Referring Physician: SELF, REFERRED Physician/Extender: Weeks in Treatment: 0 Abuse/Suicide Risk Screen Items Answer ABUSE/SUICIDE RISK SCREEN: Has anyone close to you tried to hurt or harm you recentlyo No Do you feel uncomfortable with anyone in your familyo No Has anyone forced you do things that you didnot want to doo No Do you have any thoughts of harming yourselfo No Patient displays signs or symptoms of abuse and/or neglect. No Electronic Signature(s) Signed: 03/20/2016 4:22:06 PM By: Montey Hora Entered By: Montey Hora on 03/20/2016 08:31:10 Kelli [name] ([id number]) -------------------------------------------------------------------------------- Activities of Daily Living Details Patient Name: Kelli [name], Kelli [name] 03/20/2016 8:00 Date of Service: AM Medical Record [id number] Number: Patient Account Number: [id number] 1961/02/01 (54 y.o. Treating RN: Montey Hora Date of Birth/Sex: Female) Other Clinician: Primary Care Physician: SPENCER, NICOLE Treating Christin Fudge Referring Physician: SELF, REFERRED Physician/Extender: Weeks in Treatment: 0 Activities of Daily Living Items Answer Activities of Daily Living (Please select one for each item) Drive Automobile Completely Able Take Medications Completely Able Use Telephone Completely Able Care for Appearance Completely Able Use Toilet Completely Able Bath / Shower Completely Able Dress Self Completely Able Feed Self Completely Able Walk Completely Able Get In / Out Bed Completely Able Housework Completely Able Prepare Meals  Completely [place] for Self Completely Able Electronic Signature(s) Signed: 03/20/2016 4:22:06 PM By: Montey Hora Entered By: Montey Hora on 03/20/2016 08:31:30 Kelli [name] ([id number]) -------------------------------------------------------------------------------- Education Assessment Details Patient Name: Kelli [name] 03/20/2016 8:00 Date of Service: AM Medical Record [id number] Number: Patient Account Number: [id number] 01/06/1961 (55 y.o. Treating RN: Montey Hora Date of Birth/Sex: Female) Other Clinician: Primary Care Physician: SPENCER, NICOLE Treating Britto, Errol Referring Physician: SELF, REFERRED Physician/Extender: Suella [place] in Treatment: 0 Primary Learner Assessed: Patient Learning Preferences/Education Level/Primary Language Learning Preference: Explanation, Demonstration Highest Education Level: High School Preferred Language: English Cognitive Barrier Assessment/Beliefs Language Barrier: No Translator Needed: No Memory Deficit: No Emotional Barrier: No Cultural/Religious Beliefs Affecting Medical No Care: Physical Barrier Assessment Impaired Vision: No Impaired Hearing: No Decreased Hand dexterity: No Knowledge/Comprehension Assessment Knowledge Level: Medium Comprehension Level: Medium Ability to understand written Medium instructions: Ability to understand verbal Medium instructions: Motivation Assessment Anxiety Level: Calm Cooperation: Cooperative Education Importance: Acknowledges Need Interest in Health Problems: Asks Questions Perception: Coherent Willingness to Engage in Self- Medium Management Activities: Readiness to Engage in Self- Medium Management Activities: Kelli [name], Kelli [name] ([id number]) Electronic Signature(s) Signed: 03/20/2016 4:22:06 PM By: Montey Hora Entered By: Montey Hora on 03/20/2016 08:31:54 Kelli [name]  ([id number]) -------------------------------------------------------------------------------- Fall Risk Assessment Details Patient Name: Kelli [name] 03/20/2016 8:00 Date of Service: AM Medical Record [id number] Number: Patient Account Number: [id number] March 12, 1961 (55 y.o. Treating RN: Montey Hora Date of Birth/Sex: Female) Other Clinician: Primary Care Physician: SPENCER, NICOLE Treating Britto, Errol Referring Physician: SELF, REFERRED Physician/Extender: Weeks in Treatment: 0 Fall Risk Assessment Items Have you had 2 or more falls in the last 12 monthso 0 No Have you had any fall that resulted in injury in the last 12 monthso 0 No FALL RISK ASSESSMENT: History of falling - immediate or within 3 months 0 No Secondary diagnosis 0 No Ambulatory aid None/bed rest/wheelchair/nurse 0 Yes Crutches/cane/walker 0 No Furniture 0  No IV Access/Saline Lock 0 No Gait/Training Normal/bed rest/immobile 0 Yes Weak 0 No Impaired 0 No Mental Status Oriented to own ability 0 Yes Electronic Signature(s) Signed: 03/20/2016 4:22:06 PM By: Montey Hora Entered By: Montey Hora on 03/20/2016 08:32:10 Kelli [name] ([id number]) -------------------------------------------------------------------------------- Foot Assessment Details Patient Name: Kelli [name] 03/20/2016 8:00 Date of Service: AM Medical Record [id number] Number: Patient Account Number: [id number] 12-Jan-1961 (55 y.o. Treating RN: Montey Hora Date of Birth/Sex: Female) Other Clinician: Primary Care Physician: SPENCER, NICOLE Treating Britto, Errol Referring Physician: SELF, REFERRED Physician/Extender: Weeks in Treatment: 0 Foot Assessment Items Site Locations + = Sensation present, - = Sensation absent, C = Callus, U = Ulcer R = Redness, W = Warmth, M = Maceration, PU = Pre-ulcerative lesion F = Fissure, S = Swelling, D = Dryness Assessment Right: Left: Other Deformity: No No Prior Foot Ulcer: No  No Prior Amputation: No No Charcot Joint: No No Ambulatory Status: Ambulatory Without Help Gait: Steady Electronic Signature(s) Signed: 03/20/2016 4:22:06 PM By: Montey Hora Entered By: Montey Hora on 03/20/2016 08:36:58 Kelli [name] ([id number]) -------------------------------------------------------------------------------- Nutrition Risk Assessment Details Patient Name: Kelli [name] 03/20/2016 8:00 Date of Service: AM Medical Record [id number] Number: Patient Account Number: [id number] 03-19-1961 (55 y.o. Treating RN: Montey Hora Date of Birth/Sex: Female) Other Clinician: Primary Care Physician: SPENCER, NICOLE Treating Britto, Errol Referring Physician: SELF, REFERRED Physician/Extender: Weeks in Treatment: 0 Height (in): 66 Weight (lbs): 160 Body Mass Index (BMI): 25.8 Nutrition Risk Assessment Items NUTRITION RISK SCREEN: I have an illness or condition that made me change the kind and/or 0 No amount of food I eat I eat fewer than two meals per day 0 No I eat few fruits and vegetables, or milk products 0 No I have three or more drinks of beer, liquor or wine almost every day 0 No I have tooth or mouth problems that make it hard for me to eat 0 No I don't always have enough money to buy the food I need 0 No I eat alone most of the time 0 No I take three or more different prescribed or over-the-counter drugs a 1 Yes day Without wanting to, I have lost or gained 10 pounds in the last six 0 No months I am not always physically able to shop, cook and/or feed myself 0 No Nutrition Protocols Good Risk Protocol 0 No interventions needed Moderate Risk Protocol Electronic Signature(s) Signed: 03/20/2016 4:22:06 PM By: Montey Hora Entered By: Montey Hora on 03/20/2016 08:32:16

## 2016-03-21 NOTE — Progress Notes (Signed)
Kelli [name] ([id number]) Visit Report for 03/20/2016 Allergy List Details Patient Name: Kelli [name], Kelli [name]. Date of Service: 03/20/2016 8:00 AM Medical Record Number: [id number] Patient Account Number: [id number] Date of Birth/Sex: [birth year] (55 y.o. Female) Treating RN: Montey Hora Primary Care Physician: Elisabeth Cara Other Clinician: Referring Physician: SELF, REFERRED Treating Physician/Extender: Frann Rider in Treatment: 0 Allergies Active Allergies penicillin Reaction: nausea, vomiting Allergy Notes Electronic Signature(s) Signed: 03/20/2016 4:22:06 PM By: Montey Hora Entered By: Montey Hora on 03/20/2016 08:24:12 Kelli [name] ([id number]) -------------------------------------------------------------------------------- Arrival Information Details Patient Name: Kelli [name] Date of Service: 03/20/2016 8:00 AM Medical Record Number: [id number] Patient Account Number: [id number] Date of Birth/Sex: July 11, 1961 (55 y.o. Female) Treating RN: Montey Hora Primary Care Physician: Elisabeth Cara Other Clinician: Referring Physician: SELF, REFERRED Treating Physician/Extender: Frann Rider in Treatment: 0 Visit Information Patient Arrived: Ambulatory Arrival Time: 08:20 Accompanied By: self Transfer Assistance: None Patient Identification Verified: Yes Secondary Verification Process Yes Completed: Patient Has Alerts: Yes Patient Alerts: DMII ABI L 1.09 R 1.05 History Since Last Visit Added or deleted any medications: No Any new allergies or adverse reactions: No Had a fall or experienced change in activities of daily living that may affect risk of falls: No Signs or symptoms of abuse/neglect since last visito No Hospitalized since last visit: No Pain Present Now: No Electronic Signature(s) Signed: 03/20/2016 4:22:06 PM By: Montey Hora Entered By: Montey Hora on 03/20/2016 08:43:06 Kelli [name]  ([id number]) -------------------------------------------------------------------------------- Clinic Level of Care Assessment Details Patient Name: Kelli [name] Date of Service: 03/20/2016 8:00 AM Medical Record Number: [id number] Patient Account Number: [id number] Date of Birth/Sex: 06/30/1961 (55 y.o. Female) Treating RN: Montey Hora Primary Care Physician: Elisabeth Cara Other Clinician: Referring Physician: SELF, REFERRED Treating Physician/Extender: Frann Rider in Treatment: 0 Clinic Level of Care Assessment Items TOOL 2 Quantity Score []  - Use when only an EandM is performed on the INITIAL visit 0 ASSESSMENTS - Nursing Assessment / Reassessment X - General Physical Exam (combine w/ comprehensive assessment (listed just 1 20 below) when performed on new pt. evals) X - Comprehensive Assessment (HX, ROS, Risk Assessments, Wounds Hx, etc.) 1 25 ASSESSMENTS - Wound and Skin Assessment / Reassessment []  - Simple Wound Assessment / Reassessment - one wound 0 X - Complex Wound Assessment / Reassessment - multiple wounds 2 5 []  - Dermatologic / Skin Assessment (not related to wound area) 0 ASSESSMENTS - Ostomy and/or Continence Assessment and Care []  - Incontinence Assessment and Management 0 []  - Ostomy Care Assessment and Management (repouching, etc.) 0 PROCESS - Coordination of Care X - Simple Patient / Family Education for ongoing care 1 15 []  - Complex (extensive) Patient / Family Education for ongoing care 0 X - Staff obtains Programmer, systems, Records, Test Results / Process Orders 1 10 []  - Staff telephones HHA, Nursing Homes / Clarify orders / etc 0 []  - Routine Transfer to another Facility (non-emergent condition) 0 []  - Routine Hospital Admission (non-emergent condition) 0 X - New Admissions / Biomedical engineer / Ordering NPWT, Apligraf, etc. 1 15 []  - Emergency Hospital Admission (emergent condition) 0 X - Simple Discharge Coordination 1 10 Lorenzo, Enslie G.  ([id number]) []  - Complex (extensive) Discharge Coordination 0 PROCESS - Special Needs []  - Pediatric / Minor Patient Management 0 []  - Isolation Patient Management 0 []  - Hearing / Language / Visual special needs 0 []  - Assessment of Community assistance (transportation, D/C planning, etc.) 0 []  - Additional assistance / Altered mentation 0 []  -  Support Surface(s) Assessment (bed, cushion, seat, etc.) 0 INTERVENTIONS - Wound Cleansing / Measurement X - Wound Imaging (photographs - any number of wounds) 1 5 []  - Wound Tracing (instead of photographs) 0 []  - Simple Wound Measurement - one wound 0 X - Complex Wound Measurement - multiple wounds 2 5 []  - Simple Wound Cleansing - one wound 0 X - Complex Wound Cleansing - multiple wounds 2 5 INTERVENTIONS - Wound Dressings []  - Small Wound Dressing one or multiple wounds 0 []  - Medium Wound Dressing one or multiple wounds 0 []  - Large Wound Dressing one or multiple wounds 0 []  - Application of Medications - injection 0 INTERVENTIONS - Miscellaneous []  - External ear exam 0 []  - Specimen Collection (cultures, biopsies, blood, body fluids, etc.) 0 []  - Specimen(s) / Culture(s) sent or taken to Lab for analysis 0 []  - Patient Transfer (multiple staff / Civil Service fast streamer / Similar devices) 0 []  - Simple Staple / Suture removal (25 or less) 0 []  - Complex Staple / Suture removal (26 or more) 0 Mayol, Rosemarie G. ([id number]) []  - Hypo / Hyperglycemic Management (close monitor of Blood Glucose) 0 []  - Ankle / Brachial Index (ABI) - do not check if billed separately 0 Has the patient been seen at the hospital within the last three years: Yes Total Score: 130 Level Of Care: New/Established - Level 4 Electronic Signature(s) Signed: 03/20/2016 4:22:06 PM By: Montey Hora Entered By: Montey Hora on 03/20/2016 09:02:05 Kelli [name] ([id number]) -------------------------------------------------------------------------------- Encounter Discharge  Information Details Patient Name: Kelli [name] Date of Service: 03/20/2016 8:00 AM Medical Record Number: [id number] Patient Account Number: [id number] Date of Birth/Sex: Jan 16, 1961 (55 y.o. Female) Treating RN: Montey Hora Primary Care Physician: Elisabeth Cara Other Clinician: Referring Physician: SELF, REFERRED Treating Physician/Extender: Frann Rider in Treatment: 0 Encounter Discharge Information Items Discharge Pain Level: 0 Discharge Condition: Stable Ambulatory Status: Ambulatory Discharge Destination: Home Transportation: Private Auto Accompanied By: self Schedule Follow-up Appointment: No Medication Reconciliation completed and provided to Patient/Care No Alante Tolan: Provided on Clinical Summary of Care: 03/20/2016 Form Type Recipient Paper Patient [facility] Electronic Signature(s) Signed: 03/20/2016 9:06:40 AM By: Montey Hora Previous Signature: 03/20/2016 9:02:58 AM Version By: Ruthine Dose Entered By: Montey Hora on 03/20/2016 09:06:40 Kelli [name] ([id number]) -------------------------------------------------------------------------------- Lower Extremity Assessment Details Patient Name: Kelli [name] Date of Service: 03/20/2016 8:00 AM Medical Record Number: [id number] Patient Account Number: [id number] Date of Birth/Sex: 06-May-1961 (54 y.o. Female) Treating RN: Montey Hora Primary Care Physician: Elisabeth Cara Other Clinician: Referring Physician: SELF, REFERRED Treating Physician/Extender: Frann Rider in Treatment: 0 Edema Assessment Assessed: [Left: No] [Right: No] Edema: [Left: No] [Right: No] Vascular Assessment Pulses: Posterior Tibial Palpable: [Left:Yes] [Right:Yes] Doppler: [Left:Multiphasic] Dorsalis Pedis Palpable: [Left:Yes] [Right:Yes] Doppler: [Left:Multiphasic] [Right:Multiphasic] Extremity colors, hair growth, and conditions: Extremity Color: [Left:Normal] [Right:Normal] Hair Growth on Extremity:  [Left:Yes] [Right:Yes] Temperature of Extremity: [Left:Warm] [Right:Warm] Capillary Refill: [Left:< 3 seconds] [Right:< 3 seconds] Toe Nail Assessment Left: Right: Thick: No No Discolored: No No Deformed: No No Improper Length and Hygiene: No No Electronic Signature(s) Signed: 03/20/2016 4:22:06 PM By: Montey Hora Entered By: Montey Hora on 03/20/2016 08:43:39 Heavrin, Precious Reel ([id number]) -------------------------------------------------------------------------------- Multi Wound Chart Details Patient Name: Kelli [name] Date of Service: 03/20/2016 8:00 AM Medical Record Number: [id number] Patient Account Number: [id number] Date of Birth/Sex: [birth year] (54 y.o. Female) Treating RN: Montey Hora Primary Care Physician: Elisabeth Cara Other Clinician: Referring Physician: SELF, REFERRED Treating Physician/Extender: Frann Rider in Treatment: 0 Vital Signs  Height(in): 66 Pulse(bpm): 82 Weight(lbs): 160 Blood Pressure 140/71 (mmHg): Body Mass Index(BMI): 26 Temperature(F): 97.8 Respiratory Rate 18 (breaths/min): Photos: [N/A:N/A] Wound Location: Right Lower Leg - Lateral, Right Lower Leg - Lateral, N/A Distal Distal Wounding Event: Gradually Appeared Gradually Appeared N/A Primary Etiology: Diabetic Wound/Ulcer of Diabetic Wound/Ulcer of N/A the Lower Extremity the Lower Extremity Comorbid History: Cataracts, Type II Cataracts, Type II N/A Diabetes, Neuropathy Diabetes, Neuropathy Date Acquired: 02/18/2016 02/18/2016 N/A Weeks of Treatment: 0 0 N/A Wound Status: Open Open N/A Measurements L x W x D 0.6x0.5x0.1 1.2x0.3x0.1 N/A (cm) Area (cm) : 0.236 0.283 N/A Volume (cm) : 0.024 0.028 N/A % Reduction in Area: 0.00% N/A N/A % Reduction in Volume: 0.00% N/A N/A Classification: Grade 1 Grade 1 N/A Exudate Amount: Medium Medium N/A Exudate Type: Serous Serous N/A Exudate Color: amber amber N/A Wound Margin: Flat and Intact Flat and Intact  N/A Granulation Amount: Large (67-100%) Large (67-100%) N/A Granulation Quality: Red Red, Pink N/A Califano, Verlyn G. ([id number]) Necrotic Amount: Small (1-33%) Small (1-33%) N/A Exposed Structures: Fascia: No Fascia: No N/A Fat: No Fat: No Tendon: No Tendon: No Muscle: No Muscle: No Joint: No Joint: No Bone: No Bone: No Limited to Skin Limited to Skin Breakdown Breakdown Epithelialization: None Small (1-33%) N/A Periwound Skin Texture: Edema: Yes Edema: Yes N/A Excoriation: No Excoriation: No Induration: No Induration: No Callus: No Callus: No Crepitus: No Crepitus: No Fluctuance: No Fluctuance: No Friable: No Friable: No Rash: No Rash: No Scarring: No Scarring: No Periwound Skin Moist: Yes Maceration: No N/A Moisture: Maceration: No Moist: No Dry/Scaly: No Dry/Scaly: No Periwound Skin Color: Erythema: Yes Erythema: Yes N/A Atrophie Blanche: No Atrophie Blanche: No Cyanosis: No Cyanosis: No Ecchymosis: No Ecchymosis: No Hemosiderin Staining: No Hemosiderin Staining: No Mottled: No Mottled: No Pallor: No Pallor: No Rubor: No Rubor: No Erythema Location: Circumferential Circumferential N/A Temperature: N/A No Abnormality N/A Tenderness on Yes Yes N/A Palpation: Wound Preparation: Ulcer Cleansing: Ulcer Cleansing: N/A Rinsed/Irrigated with Rinsed/Irrigated with Saline Saline Topical Anesthetic Topical Anesthetic Applied: Other: lidocaine Applied: Other: lidocaine 4% 4% Treatment Notes Electronic Signature(s) Signed: 03/20/2016 4:22:06 PM By: Montey Hora Entered By: Montey Hora on 03/20/2016 08:54:49 Kelli [name] ([id number]) -------------------------------------------------------------------------------- Pain Assessment Details Patient Name: Kelli [name] Date of Service: 03/20/2016 8:00 AM Medical Record Number: [id number] Patient Account Number: [id number] Date of Birth/Sex: 1961/06/13 (54 y.o. Female) Treating RN: Montey Hora Primary Care Physician: Elisabeth Cara Other Clinician: Referring Physician: SELF, REFERRED Treating Physician/Extender: Frann Rider in Treatment: 0 Active Problems Location of Pain Severity and Description of Pain Patient Has Paino No Site Locations Pain Management and Medication Current Pain Management: Notes Topical or injectable lidocaine is offered to patient for acute pain when surgical debridement is performed. If needed, Patient is instructed to use over the counter pain medication for the following 24-48 hours after debridement. Wound care MDs do not prescribed pain medications. Patient has chronic pain or uncontrolled pain. Patient has been instructed to make an appointment with their Primary Care Physician for pain management. Electronic Signature(s) Signed: 03/20/2016 4:22:06 PM By: Montey Hora Entered By: Montey Hora on 03/20/2016 08:20:55 Kelli [name] ([id number]) -------------------------------------------------------------------------------- Patient/Caregiver Education Details Patient Name: Kelli [name] Date of Service: 03/20/2016 8:00 AM Medical Record Number: [id number] Patient Account Number: [id number] Date of Birth/Gender: 08/22/1961 (54 y.o. Female) Treating RN: Montey Hora Primary Care Physician: Elisabeth Cara Other Clinician: Referring Physician: SELF, REFERRED Treating Physician/Extender: Frann Rider in Treatment: 0 Education Assessment Education Provided To:  Patient Education Topics Provided Wound/Skin Impairment: Handouts: Other: please see dermatologist for skin condition Methods: Explain/Verbal Responses: State content correctly Electronic Signature(s) Signed: 03/20/2016 4:22:06 PM By: Montey Hora Entered By: Montey Hora on 03/20/2016 09:07:58 Kelli [name] ([id number]) -------------------------------------------------------------------------------- Wound Assessment Details Patient Name:  Kelli [name] Date of Service: 03/20/2016 8:00 AM Medical Record Number: [id number] Patient Account Number: [id number] Date of Birth/Sex: 1961/07/31 (54 y.o. Female) Treating RN: Montey Hora Primary Care Physician: Elisabeth Cara Other Clinician: Referring Physician: SELF, REFERRED Treating Physician/Extender: Frann Rider in Treatment: 0 Wound Status Wound Number: 2 Primary Diabetic Wound/Ulcer of the Lower Etiology: Extremity Wound Location: Right Lower Leg - Lateral, Distal Wound Status: Open Wounding Event: Gradually Appeared Comorbid Cataracts, Type II Diabetes, History: Neuropathy Date Acquired: 02/18/2016 Weeks Of Treatment: 0 Clustered Wound: No Photos Wound Measurements Length: (cm) 0.6 % Reduction in Width: (cm) 0.5 % Reduction in Depth: (cm) 0.1 Epithelializati Area: (cm) 0.236 Tunneling: Volume: (cm) 0.024 Undermining: Area: 0% Volume: 0% on: None No No Wound Description Classification: Grade 1 Wound Margin: Flat and Intact Exudate Amount: Medium Exudate Type: Serous Exudate Color: amber Foul Odor After Cleansing: No Wound Bed Granulation Amount: Large (67-100%) Exposed Structure Granulation Quality: Red Fascia Exposed: No Necrotic Amount: Small (1-33%) Fat Layer Exposed: No Necrotic Quality: Adherent Slough Tendon Exposed: No Sosnowski, Iyla G. ([id number]) Muscle Exposed: No Joint Exposed: No Bone Exposed: No Limited to Skin Breakdown Periwound Skin Texture Texture Color No Abnormalities Noted: No No Abnormalities Noted: No Callus: No Atrophie Blanche: No Crepitus: No Cyanosis: No Excoriation: No Ecchymosis: No Fluctuance: No Erythema: Yes Friable: No Erythema Location: Circumferential Induration: No Hemosiderin Staining: No Localized Edema: Yes Mottled: No Rash: No Pallor: No Scarring: No Rubor: No Moisture Temperature / Pain No Abnormalities Noted: No Tenderness on Palpation: Yes Dry / Scaly: No Maceration:  No Moist: Yes Wound Preparation Ulcer Cleansing: Rinsed/Irrigated with Saline Topical Anesthetic Applied: Other: lidocaine 4%, Electronic Signature(s) Signed: 03/20/2016 4:22:06 PM By: Montey Hora Entered By: Montey Hora on 03/20/2016 08:42:15 Kelli [name] ([id number]) -------------------------------------------------------------------------------- Wound Assessment Details Patient Name: Kelli [name] Date of Service: 03/20/2016 8:00 AM Medical Record Number: [id number] Patient Account Number: [id number] Date of Birth/Sex: 1961/03/03 (54 y.o. Female) Treating RN: Montey Hora Primary Care Physician: Elisabeth Cara Other Clinician: Referring Physician: SELF, REFERRED Treating Physician/Extender: Frann Rider in Treatment: 0 Wound Status Wound Number: 3 Primary Diabetic Wound/Ulcer of the Lower Etiology: Extremity Wound Location: Right Lower Leg - Lateral, Distal Wound Status: Open Wounding Event: Gradually Appeared Comorbid Cataracts, Type II Diabetes, History: Neuropathy Date Acquired: 02/18/2016 Weeks Of Treatment: 0 Clustered Wound: No Photos Wound Measurements Length: (cm) 1.2 % Reduction in Width: (cm) 0.3 % Reduction in Depth: (cm) 0.1 Epithelializati Area: (cm) 0.283 Tunneling: Volume: (cm) 0.028 Undermining: Area: Volume: on: Small (1-33%) No No Wound Description Classification: Grade 1 Wound Margin: Flat and Intact Exudate Amount: Medium Exudate Type: Serous Exudate Color: amber Foul Odor After Cleansing: No Wound Bed Granulation Amount: Large (67-100%) Exposed Structure Granulation Quality: Red, Pink Fascia Exposed: No Necrotic Amount: Small (1-33%) Fat Layer Exposed: No Necrotic Quality: Adherent Slough Tendon Exposed: No Mckinnie, Auriana G. ([id number]) Muscle Exposed: No Joint Exposed: No Bone Exposed: No Limited to Skin Breakdown Periwound Skin Texture Texture Color No Abnormalities Noted: No No Abnormalities Noted:  No Callus: No Atrophie Blanche: No Crepitus: No Cyanosis: No Excoriation: No Ecchymosis: No Fluctuance: No Erythema: Yes Friable: No Erythema Location: Circumferential Induration: No Hemosiderin Staining: No Localized Edema: Yes Mottled: No  Rash: No Pallor: No Scarring: No Rubor: No Moisture Temperature / Pain No Abnormalities Noted: No Temperature: No Abnormality Dry / Scaly: No Tenderness on Palpation: Yes Maceration: No Moist: No Wound Preparation Ulcer Cleansing: Rinsed/Irrigated with Saline Topical Anesthetic Applied: Other: lidocaine 4%, Electronic Signature(s) Signed: 03/20/2016 4:22:06 PM By: Montey Hora Entered By: Montey Hora on 03/20/2016 08:41:58 Kelli [name] ([id number]) -------------------------------------------------------------------------------- Vitals Details Patient Name: Kelli [name] Date of Service: 03/20/2016 8:00 AM Medical Record Number: [id number] Patient Account Number: [id number] Date of Birth/Sex: April 04, 1961 (54 y.o. Female) Treating RN: Montey Hora Primary Care Physician: Elisabeth Cara Other Clinician: Referring Physician: SELF, REFERRED Treating Physician/Extender: Frann Rider in Treatment: 0 Vital Signs Time Taken: 08:20 Temperature (F): 97.8 Height (in): 66 Pulse (bpm): 82 Source: Stated Respiratory Rate (breaths/min): 18 Weight (lbs): 160 Blood Pressure (mmHg): 140/71 Source: Stated Reference Range: 80 - 120 mg / dl Body Mass Index (BMI): 25.8 Electronic Signature(s) Signed: 03/20/2016 4:22:06 PM By: Montey Hora Entered By: Montey Hora on 03/20/2016 08:23:03

## 2016-03-21 NOTE — Progress Notes (Signed)
Kelli [name] ([id number]) Visit Report for 03/20/2016 Chief Complaint Document Details Patient Name: Kelli [name], Kelli [name] 03/20/2016 8:00 Date of Service: AM Medical Record [id number] Number: Patient Account Number: [id number] 1960-12-26 (54 y.o. Treating RN: Montey Hora Date of Birth/Sex: Female) Other Clinician: Primary Care Physician: SPENCER, NICOLE Treating Christin Fudge Referring Physician: SELF, REFERRED Physician/Extender: Suella Grove in Treatment: 0 Information Obtained from: Patient Chief Complaint Patient returns with multiple skin ulcerations over the arms and legs for about a month Electronic Signature(s) Signed: 03/20/2016 9:24:47 AM By: Christin Fudge MD, FACS Entered By: Christin Fudge on 03/20/2016 09:24:47 Kelli [name] ([id number]) -------------------------------------------------------------------------------- HPI Details Patient Name: Kelli [name] 03/20/2016 8:00 Date of Service: AM Medical Record [id number] Number: Patient Account Number: [id number] 08-Jan-1961 (55 y.o. Treating RN: Montey Hora Date of Birth/Sex: Female) Other Clinician: Primary Care Physician: SPENCER, NICOLE Treating Christin Fudge Referring Physician: SELF, REFERRED Physician/Extender: Weeks in Treatment: 0 History of Present Illness Location: right lateral calf region, also on her left upper arm right upper arm and Quality: Patient reports No Pain. Severity: Patient states wound are getting worse. Duration: Patient has had the wound for > 1 months prior to seeking treatment at the wound center Context: The wound appeared gradually over time Modifying Factors: Other treatment(s) tried include:she has seen a dermatologist the past but not recently after this problem has started Associated Signs and Symptoms: Patient reports having increase swelling. HPI Description: She returns to [place] with new skin lesions and was seen in the past earlier this year. 55 year old patient comes as a  self-referral for a wound on her right lower calf which has been there for about 1 months. Her past medical history is significant for diabetes mellitus type 2, depression and migraine. She has not had any surgery. She was known to be a smoker and has quit smoking 30 years ago. In the past when I saw her earlier this year, she was told she had a staph infection and this is possibly by a culture done by the dermatologist. I do not believe a skin biopsy has been done. I reviewed some dermatology notes from her dermatologist Dr. Elmer Ramp, who saw her in October 2016 for an unrelated problem where she had an infected cyst on her left rib cage. An IandD was done and she was put on doxycycline. 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 This time around she has not seen her dermatologist but has an appointment later this week Electronic Signature(s) Signed: 03/20/2016 9:27:37 AM By: Christin Fudge MD, FACS Entered By: Christin Fudge on 03/20/2016 09:27:37 Kelli [name] ([id number]) -------------------------------------------------------------------------------- Physical Exam Details Patient Name: Kelli [name] 03/20/2016 8:00 Date of Service: AM Medical Record [id number] Number: Patient Account Number: [id number] [birth year] (55 y.o. Treating RN: Montey Hora Date of Birth/Sex: Female) Other Clinician: Primary Care Physician: SPENCER, NICOLE Treating Christin Fudge Referring Physician: SELF, REFERRED Physician/Extender: Weeks in Treatment: 0 Constitutional . Pulse regular. Respirations normal and unlabored. Afebrile. . Eyes Nonicteric. Reactive to light. Ears, Nose, Mouth, and Throat Lips, teeth, and gums WNL.Marland Kitchen Moist mucosa without lesions. Neck supple and nontender. No palpable supraclavicular or cervical adenopathy. Normal sized without goiter. Respiratory WNL. No retractions.. Cardiovascular Pedal Pulses WNL. normal ABIs both lower  extremities with the left being 1.09 on the right being 1.05. No clubbing, cyanosis or edema. Chest Breasts symmetical and no nipple discharge.. Breast tissue WNL, no masses, lumps, or tenderness.. Gastrointestinal (GI) Abdomen without masses or tenderness.Marland Kitchen No  liver or spleen enlargement or tenderness.. Lymphatic No adneopathy. No adenopathy. No adenopathy. Musculoskeletal Adexa without tenderness or enlargement.. Digits and nails w/o clubbing, cyanosis, infection, petechiae, ischemia, or inflammatory conditions.. Integumentary (Hair, Skin) No suspicious lesions. No crepitus or fluctuance. No peri-wound warmth or erythema. No masses.Marland Kitchen Psychiatric Judgement and insight Intact.. No evidence of depression, anxiety, or agitation.. Notes The patient has multiple nodular lesions on the right lower extremity left upper arm and right arm and some of these have ulcerations in the center. Durations are superficial and no evidence of any slough at the base. He has several other areas of excoriation all over her body. Electronic Signature(s) Signed: 03/20/2016 9:29:11 AM By: Christin Fudge MD, FACS Kelli [name] ([id number]) Entered By: Christin Fudge on 03/20/2016 09:29:11 Kelli [name] ([id number]) -------------------------------------------------------------------------------- Physician Orders Details Patient Name: Kelli [name] 03/20/2016 8:00 Date of Service: AM Medical Record [id number] Number: Patient Account Number: [id number] 07/01/1961 (56 y.o. Treating RN: Montey Hora Date of Birth/Sex: Female) Other Clinician: Primary Care Physician: SPENCER, NICOLE Treating Christin Fudge Referring Physician: SELF, REFERRED Physician/Extender: Suella Grove in Treatment: 0 Verbal / Phone Orders: Yes Clinician: Montey Hora Read Back and Verified: Yes Diagnosis Coding Discharge From [facility] Wound #2 Right,Proximal,Lateral Lower Leg o Discharge from [place] - Please follow  up with your dermatologist and return to the [place] if needed Wound #3 Right,Distal,Lateral Lower Leg o Discharge from [place] - Please follow up with your dermatologist and return to the [place] if needed Electronic Signature(s) Signed: 03/20/2016 4:12:41 PM By: Christin Fudge MD, FACS Signed: 03/20/2016 4:22:06 PM By: Montey Hora Entered By: Montey Hora on 03/20/2016 08:58:18 Kelli [name] ([id number]) -------------------------------------------------------------------------------- Problem List Details Patient Name: HARLEIGH, KLUTE 03/20/2016 8:00 Date of Service: AM Medical Record [id number] Number: Patient Account Number: [id number] 10/16/1960 (55 y.o. 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 Treating RN: Montey Hora Date of Birth/Sex: Female) Other Clinician: Primary Care Physician: SPENCER, NICOLE Treating Adelin Ventrella Referring Physician: SELF, REFERRED Physician/Extender: Weeks in Treatment: 0 Subjective Chief Complaint Information  obtained from Patient Patient returns with multiple skin ulcerations over the arms and legs for about a month History of Present Illness (HPI) The following HPI elements were documented for the patient's wound: Location: right lateral calf region, also on her left upper arm right upper arm and Quality: Patient reports No Pain. Severity: Patient states wound are getting worse. Duration: Patient has had the wound for > 1 months prior to seeking treatment at the wound center Context: The wound appeared gradually over time Modifying Factors: Other treatment(s) tried include:she has seen a dermatologist the past but not recently after this problem has started Associated Signs and Symptoms: Patient reports having increase swelling. She returns to [place] with new skin lesions and was seen in the past earlier this year. 55 year old patient  comes as a self-referral for a wound on her right lower calf which has been there for about 1 months. Her past medical history is significant for diabetes mellitus type 2, depression and migraine. She has not had any surgery. She was known to be a smoker and has quit smoking 30 years ago. In the past when I saw her earlier this year, she was told she had a staph infection and this is possibly by a culture done by the dermatologist. I do not believe a skin biopsy has been done. I reviewed some dermatology notes from her dermatologist Dr. Elmer Ramp, who saw her in October 2016 for an unrelated problem where she had an infected cyst on her left rib cage. An IandD was done and she was put on doxycycline. 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 This time around she has not seen her dermatologist but has an appointment later this week Wound History Patient presents with 2 open wounds that have been present for approximately 1 month. Patient has been treating wounds in the following manner: antibiotic ointment. Laboratory tests  have not been performed in the last month. Patient reportedly has not tested positive for an antibiotic resistant organism. Patient reportedly has not tested positive for osteomyelitis. Patient reportedly has not had testing performed to evaluate circulation in the legs. SHEREENA, STROBLE ([id number]) Patient History Information obtained from Patient. Allergies penicillin (Reaction: nausea, vomiting) Family History Diabetes - Mother, Father, Maternal Grandparents, Heart Disease - Mother, Tuberculosis - Father, No family history of Cancer, Hypertension, Kidney Disease, Lung Disease, Seizures, Stroke, Thyroid Problems. Social History Former smoker - 30 years ago quit, Marital Status - Married, Alcohol Use - Never, Drug Use - No History, Caffeine Use - Daily. Medical History Eyes Patient has history of Cataracts - removed Medical And Surgical History Notes Eyes laser surgery, retina surgery Endocrine Hemoglobin A1C 11 Medications lisinopril 5 mg tablet oral 1 1 tablet oral nightly Fluarix Quad 2016-2017 (PF) 60 mcg (15 mcg x 4)/0.5 mL IM syringe intramuscular syringe intramuscular Novolog Flexpen 100 unit/mL subcutaneous subcutaneous insulin pen subcutaneous inject 15 units three times daily aspirin 81 mg tablet,delayed release oral 1 1 tablet,delayed release (DR/EC) oral daily Zoloft 100 mg tablet oral tablet oral as directed Objective Constitutional Pulse regular. Respirations normal and unlabored. Afebrile. Vitals Time Taken: 8:20 AM, Height: 66 in, Source: Stated, Weight: 160 lbs, Source: Stated, BMI: 25.8, Teston, Lurae G. ([id number]) Temperature: 97.8 F, Pulse: 82 bpm, Respiratory Rate: 18 breaths/min, Blood Pressure: 140/71 mmHg. Eyes Nonicteric. Reactive to light. Ears, Nose, Mouth, and Throat Lips, teeth, and gums WNL.Marland Kitchen Moist mucosa without lesions. Neck supple and nontender. No palpable supraclavicular or cervical adenopathy. Normal sized without  goiter. Respiratory WNL. No retractions.. Cardiovascular Pedal Pulses WNL. normal ABIs both lower extremities with the left being 1.09 on the right being 1.05. No clubbing, cyanosis or edema. Chest Breasts symmetical and no nipple discharge.. Breast tissue WNL, no masses, lumps, or tenderness.. Gastrointestinal (GI) Abdomen without masses or tenderness.. No liver or spleen enlargement or tenderness.. Lymphatic No adneopathy. No adenopathy. No adenopathy. Musculoskeletal Adexa without tenderness or enlargement.. Digits and nails w/o clubbing, cyanosis, infection, petechiae, ischemia, or inflammatory conditions.Marland Kitchen Psychiatric Judgement and insight Intact.. No evidence of depression, anxiety, or agitation.. General Notes: The patient has multiple nodular lesions on the right lower extremity left upper arm and right arm and some of these have ulcerations in the center. Durations are superficial and no  evidence of any slough at the base. He has several other areas of excoriation all over her body. Integumentary (Hair, Skin) No suspicious lesions. No crepitus or fluctuance. No peri-wound warmth or erythema. No masses.. Wound #2 status is Open. Original cause of wound was Gradually Appeared. The wound is located on the Right,Proximal,Lateral Lower Leg. The wound measures 0.6cm length x 0.5cm width x 0.1cm depth; 0.236cm^2 area and 0.024cm^3 volume. The wound is limited to skin breakdown. There is no tunneling or undermining noted. There is a medium amount of serous drainage noted. The wound margin is flat and intact. There is large (67-100%) red granulation within the wound bed. There is a small (1-33%) amount of necrotic tissue within the wound bed including Adherent Slough. The periwound skin appearance exhibited: Localized Edema, Moist, Erythema. The periwound skin appearance did not exhibit: Callus, Crepitus, Excoriation, Fluctuance, Friable, Induration, Rash, Scarring, Dry/Scaly,  Maceration, Atrophie Moniya, Meditz, Jeanny G. ([id number]) Cyanosis, Ecchymosis, Hemosiderin Staining, Mottled, Pallor, Rubor. The surrounding wound skin color is noted with erythema which is circumferential. The periwound has tenderness on palpation. Wound #3 status is Open. Original cause of wound was Gradually Appeared. The wound is located on the Right,Distal,Lateral Lower Leg. The wound measures 1.2cm length x 0.3cm width x 0.1cm depth; 0.283cm^2 area and 0.028cm^3 volume. The wound is limited to skin breakdown. There is no tunneling or undermining noted. There is a medium amount of serous drainage noted. The wound margin is flat and intact. There is large (67-100%) red, pink granulation within the wound bed. There is a small (1-33%) amount of necrotic tissue within the wound bed including Adherent Slough. The periwound skin appearance exhibited: Localized Edema, Erythema. The periwound skin appearance did not exhibit: Callus, Crepitus, Excoriation, Fluctuance, Friable, Induration, Rash, Scarring, Dry/Scaly, Maceration, Moist, Atrophie Blanche, Cyanosis, Ecchymosis, Hemosiderin Staining, Mottled, Pallor, Rubor. The surrounding wound skin color is noted with erythema which is circumferential. Periwound temperature was noted as No Abnormality. The periwound has tenderness on palpation. Assessment Active Problems ICD-10 E11.622 - Type 2 diabetes mellitus with other skin ulcer L97.212 - Non-pressure chronic ulcer of right calf with fat layer exposed L30.9 - Dermatitis, unspecified This patient now returns with multiple skin lesions over arms and legs and I believe this is to do with a dermatological condition which is beyond the scope of our practice. She has an appointment to see her dermatologist later this week and I have urged her to keep this and hopefully get a biopsy of the skin lesions which she has. If the dermatologist wants [place] to continue to look after her we would be happy to  see her back, but if not she should continue under the dermatologist's care. The patient is agreeable with this plan. Plan Discharge From [facility] Services: Wound #2 Right,Proximal,Lateral Lower Leg: Discharge from [place] - Please follow up with your dermatologist and return to the [place] if needed Wound #3 Right,Distal,Lateral Lower Leg: Discharge from [place] - Please follow up with your dermatologist and return to the [place] if needed IMPI, FICHTNER ([id number]) This patient now returns with multiple skin lesions over arms and legs and I believe this is to do with a dermatological condition which is beyond the scope of our practice. She has an appointment to see her dermatologist later this week and I have urged her to keep this and hopefully get a biopsy of the skin lesions which she has. If the dermatologist wants [place] to continue to  look after her we would be happy to see her back, but if not she should continue under the dermatologist's care. The patient is agreeable with this plan. Electronic Signature(s) Signed: 03/20/2016 9:31:16 AM By: Christin Fudge MD, FACS Entered By: Christin Fudge on 03/20/2016 09:31:15 Kelli [name] ([id number]) -------------------------------------------------------------------------------- ROS/PFSH Details Patient Name: NAZARIA, CASALI 03/20/2016 8:00 Date of Service: AM Medical Record [id number] Number: Patient Account Number: [id number] 25-Mar-1961 (55 y.o. Treating RN: Montey Hora Date of Birth/Sex: Female) Other Clinician: Primary Care Physician: SPENCER, NICOLE Treating Montia Haslip Referring Physician: SELF, REFERRED Physician/Extender: Weeks in Treatment: 0 Information Obtained From Patient Wound History Do you currently have one or more open woundso Yes How many open wounds do you currently haveo 2 Approximately how long have you had your woundso 1 month How have you been treating your  wound(s) until nowo antibiotic ointment Has your wound(s) ever healed and then re-openedo No Have you had any lab [name] done in the past montho No Have you tested positive for an antibiotic resistant organism (MRSA, VRE)o No Have you tested positive for osteomyelitis (bone infection)o No Have you had any tests for circulation on your legso No Eyes Medical History: Positive for: Cataracts - removed Negative for: Glaucoma; Optic Neuritis Past Medical History Notes: laser surgery, retina surgery Ear/Nose/Mouth/Throat Medical History: Negative for: Chronic sinus problems/congestion; Middle ear problems Hematologic/Lymphatic Medical History: Negative for: Anemia; Hemophilia; Human Immunodeficiency Virus; Lymphedema; Sickle Cell Disease Respiratory Medical History: Negative for: Aspiration; Asthma; Chronic Obstructive Pulmonary Disease (COPD); Pneumothorax; Sleep Apnea; Tuberculosis Cardiovascular Medical History: Negative for: Angina; Arrhythmia; Congestive Heart Failure; Coronary Artery Disease; Deep Vein Mizer, Tranae G. ([id number]) Thrombosis; Hypertension; Hypotension; Myocardial Infarction; Peripheral Arterial Disease; Peripheral Venous Disease; Phlebitis; Vasculitis Endocrine Medical History: Positive for: Type II Diabetes Past Medical History Notes: Hemoglobin A1C 11 Time with diabetes: since 1996 Treated with: Insulin, Oral agents Blood sugar tested every day: Yes Tested : 3x day Genitourinary Medical History: Negative for: End Stage Renal Disease Immunological Medical History: Negative for: Lupus Erythematosus; Raynaudos; Scleroderma Integumentary (Skin) Medical History: Negative for: History of Burn; History of pressure wounds Musculoskeletal Medical History: Negative for: Gout; Rheumatoid Arthritis; Osteoarthritis; Osteomyelitis Neurologic Medical History: Positive for: Neuropathy Negative for: Dementia; Quadriplegia; Paraplegia; Seizure  Disorder Oncologic Medical History: Negative for: Received Chemotherapy; Received Radiation Psychiatric Medical History: Negative for: Anorexia/bulimia; Confinement Anxiety HBO Extended History Items LYNICE, HELDMAN. ([id number]) Eyes: Cataracts Immunizations Immunization Notes: up to date, shingles-flu and pneumonia vaccines current Family and Social History Cancer: No; Diabetes: Yes - Mother, Father, Maternal Grandparents; Heart Disease: Yes - Mother; Hypertension: No; Kidney Disease: No; Lung Disease: No; Seizures: No; Stroke: No; Thyroid Problems: No; Tuberculosis: Yes - Father; Former smoker - 30 years ago quit; Marital Status - Married; Alcohol Use: Never; Drug Use: No History; Caffeine Use: Daily; Financial Concerns: No; Food, Clothing or Shelter Needs: No; Support System Lacking: No; Transportation Concerns: No; Advanced Directives: No; Patient does not want information on Advanced Directives; Living Will: No; Medical Power of Attorney: No Physician Affirmation I have reviewed and agree with the above information. Electronic Signature(s) Signed: 03/20/2016 4:12:41 PM By: Christin Fudge MD, FACS Signed: 03/20/2016 4:22:06 PM By: Montey Hora Entered By: Christin Fudge on 03/20/2016 08:50:23 Kelli [name] ([id number]) -------------------------------------------------------------------------------- SuperBill Details Patient Name: Kelli [name] Date of Service: 03/20/2016 Medical Record Number: [id number] Patient Account Number: [id number] Date of Birth/Sex: 1960-12-17 (54 y.o. Female) Treating RN: Montey Hora Primary Care Physician: Elisabeth Cara Other Clinician: Referring Physician: SELF, REFERRED Treating  Physician/Extender: Frann Rider in Treatment: 0 Diagnosis Coding ICD-10 Codes Code Description E11.622 Type 2 diabetes mellitus with other skin ulcer L97.212 Non-pressure chronic ulcer of right calf with fat layer exposed L30.9 Dermatitis,  unspecified Facility Procedures CPT4 Code: [id number] Description: 99214 - WOUND CARE VISIT-LEV 4 EST PT Modifier: Quantity: 1 Physician Procedures CPT4 Code: [id number] Description: [id number] - WC PHYS LEVEL 4 - EST PT ICD-10 Description Diagnosis E11.622 Type 2 diabetes mellitus with other skin ulcer L97.212 Non-pressure chronic ulcer of right calf with fat L30.9 Dermatitis, unspecified Modifier: layer exposed Quantity: 1 Electronic Signature(s) Signed: 03/20/2016 9:31:31 AM By: Christin Fudge MD, FACS Entered By: Christin Fudge on 03/20/2016 09:31:30

## 2016-04-02 DIAGNOSIS — L281 Prurigo nodularis: Secondary | ICD-10-CM | POA: Diagnosis not present

## 2016-04-02 DIAGNOSIS — L728 Other follicular cysts of the skin and subcutaneous tissue: Secondary | ICD-10-CM | POA: Diagnosis not present

## 2016-05-07 DIAGNOSIS — L728 Other follicular cysts of the skin and subcutaneous tissue: Secondary | ICD-10-CM | POA: Diagnosis not present

## 2016-05-07 DIAGNOSIS — L281 Prurigo nodularis: Secondary | ICD-10-CM | POA: Diagnosis not present

## 2016-05-20 DIAGNOSIS — E113513 Type 2 diabetes mellitus with proliferative diabetic retinopathy with macular edema, bilateral: Secondary | ICD-10-CM | POA: Diagnosis not present

## 2016-05-27 DIAGNOSIS — E113512 Type 2 diabetes mellitus with proliferative diabetic retinopathy with macular edema, left eye: Secondary | ICD-10-CM | POA: Diagnosis not present

## 2016-05-29 DIAGNOSIS — Z794 Long term (current) use of insulin: Secondary | ICD-10-CM | POA: Diagnosis not present

## 2016-05-29 DIAGNOSIS — M79672 Pain in left foot: Secondary | ICD-10-CM | POA: Diagnosis not present

## 2016-05-29 DIAGNOSIS — E1142 Type 2 diabetes mellitus with diabetic polyneuropathy: Secondary | ICD-10-CM | POA: Diagnosis not present

## 2016-05-29 DIAGNOSIS — S93602A Unspecified sprain of left foot, initial encounter: Secondary | ICD-10-CM | POA: Diagnosis not present

## 2016-06-03 DIAGNOSIS — E113512 Type 2 diabetes mellitus with proliferative diabetic retinopathy with macular edema, left eye: Secondary | ICD-10-CM | POA: Diagnosis not present

## 2016-06-05 DIAGNOSIS — H4312 Vitreous hemorrhage, left eye: Secondary | ICD-10-CM | POA: Diagnosis not present

## 2016-06-05 DIAGNOSIS — E11319 Type 2 diabetes mellitus with unspecified diabetic retinopathy without macular edema: Secondary | ICD-10-CM | POA: Diagnosis not present

## 2016-06-05 DIAGNOSIS — H4313 Vitreous hemorrhage, bilateral: Secondary | ICD-10-CM | POA: Diagnosis not present

## 2016-06-05 DIAGNOSIS — H35372 Puckering of macula, left eye: Secondary | ICD-10-CM | POA: Diagnosis not present

## 2016-06-05 DIAGNOSIS — Z961 Presence of intraocular lens: Secondary | ICD-10-CM | POA: Diagnosis not present

## 2016-06-05 DIAGNOSIS — E113512 Type 2 diabetes mellitus with proliferative diabetic retinopathy with macular edema, left eye: Secondary | ICD-10-CM | POA: Diagnosis not present

## 2016-06-11 DIAGNOSIS — L728 Other follicular cysts of the skin and subcutaneous tissue: Secondary | ICD-10-CM | POA: Diagnosis not present

## 2016-06-11 DIAGNOSIS — L281 Prurigo nodularis: Secondary | ICD-10-CM | POA: Diagnosis not present

## 2016-06-17 DIAGNOSIS — S93602D Unspecified sprain of left foot, subsequent encounter: Secondary | ICD-10-CM | POA: Diagnosis not present

## 2016-06-17 DIAGNOSIS — E1142 Type 2 diabetes mellitus with diabetic polyneuropathy: Secondary | ICD-10-CM | POA: Diagnosis not present

## 2016-06-17 DIAGNOSIS — M79672 Pain in left foot: Secondary | ICD-10-CM | POA: Diagnosis not present

## 2016-06-17 DIAGNOSIS — Z794 Long term (current) use of insulin: Secondary | ICD-10-CM | POA: Diagnosis not present

## 2016-06-25 DIAGNOSIS — E1165 Type 2 diabetes mellitus with hyperglycemia: Secondary | ICD-10-CM | POA: Diagnosis not present

## 2016-06-25 DIAGNOSIS — M25572 Pain in left ankle and joints of left foot: Secondary | ICD-10-CM | POA: Diagnosis not present

## 2016-06-25 DIAGNOSIS — G629 Polyneuropathy, unspecified: Secondary | ICD-10-CM | POA: Diagnosis not present

## 2016-06-25 DIAGNOSIS — Z1329 Encounter for screening for other suspected endocrine disorder: Secondary | ICD-10-CM | POA: Diagnosis not present

## 2016-07-15 DIAGNOSIS — M25572 Pain in left ankle and joints of left foot: Secondary | ICD-10-CM | POA: Diagnosis not present

## 2016-07-15 DIAGNOSIS — S93422D Sprain of deltoid ligament of left ankle, subsequent encounter: Secondary | ICD-10-CM | POA: Diagnosis not present

## 2016-07-15 DIAGNOSIS — E1140 Type 2 diabetes mellitus with diabetic neuropathy, unspecified: Secondary | ICD-10-CM | POA: Diagnosis not present

## 2016-08-12 DIAGNOSIS — R109 Unspecified abdominal pain: Secondary | ICD-10-CM | POA: Diagnosis not present

## 2016-08-12 DIAGNOSIS — E279 Disorder of adrenal gland, unspecified: Secondary | ICD-10-CM | POA: Diagnosis not present

## 2016-08-12 DIAGNOSIS — E1165 Type 2 diabetes mellitus with hyperglycemia: Secondary | ICD-10-CM | POA: Diagnosis not present

## 2016-08-12 DIAGNOSIS — N3289 Other specified disorders of bladder: Secondary | ICD-10-CM | POA: Diagnosis not present

## 2016-08-12 DIAGNOSIS — E861 Hypovolemia: Secondary | ICD-10-CM | POA: Diagnosis not present

## 2016-08-12 DIAGNOSIS — R112 Nausea with vomiting, unspecified: Secondary | ICD-10-CM | POA: Diagnosis not present

## 2016-08-12 DIAGNOSIS — Z87891 Personal history of nicotine dependence: Secondary | ICD-10-CM | POA: Diagnosis not present

## 2016-08-12 DIAGNOSIS — E1142 Type 2 diabetes mellitus with diabetic polyneuropathy: Secondary | ICD-10-CM | POA: Diagnosis not present

## 2016-08-12 DIAGNOSIS — R140 Abdominal distension (gaseous): Secondary | ICD-10-CM | POA: Diagnosis not present

## 2016-08-12 DIAGNOSIS — Z794 Long term (current) use of insulin: Secondary | ICD-10-CM | POA: Diagnosis not present

## 2016-08-12 DIAGNOSIS — R1013 Epigastric pain: Secondary | ICD-10-CM | POA: Diagnosis not present

## 2016-08-12 DIAGNOSIS — K5900 Constipation, unspecified: Secondary | ICD-10-CM | POA: Diagnosis not present

## 2016-08-12 DIAGNOSIS — R8299 Other abnormal findings in urine: Secondary | ICD-10-CM | POA: Diagnosis not present

## 2016-08-12 DIAGNOSIS — R05 Cough: Secondary | ICD-10-CM | POA: Diagnosis not present

## 2016-08-12 DIAGNOSIS — E785 Hyperlipidemia, unspecified: Secondary | ICD-10-CM | POA: Diagnosis not present

## 2016-08-13 DIAGNOSIS — E119 Type 2 diabetes mellitus without complications: Secondary | ICD-10-CM | POA: Diagnosis not present

## 2016-08-13 DIAGNOSIS — E861 Hypovolemia: Secondary | ICD-10-CM | POA: Diagnosis not present

## 2016-08-13 DIAGNOSIS — R1110 Vomiting, unspecified: Secondary | ICD-10-CM | POA: Diagnosis not present

## 2016-08-13 DIAGNOSIS — R509 Fever, unspecified: Secondary | ICD-10-CM | POA: Diagnosis not present

## 2016-08-13 DIAGNOSIS — G629 Polyneuropathy, unspecified: Secondary | ICD-10-CM | POA: Insufficient documentation

## 2016-08-13 DIAGNOSIS — R112 Nausea with vomiting, unspecified: Secondary | ICD-10-CM | POA: Diagnosis not present

## 2016-08-14 DIAGNOSIS — R1110 Vomiting, unspecified: Secondary | ICD-10-CM | POA: Diagnosis not present

## 2016-08-14 DIAGNOSIS — E1140 Type 2 diabetes mellitus with diabetic neuropathy, unspecified: Secondary | ICD-10-CM | POA: Diagnosis not present

## 2016-08-14 DIAGNOSIS — Z794 Long term (current) use of insulin: Secondary | ICD-10-CM | POA: Diagnosis not present

## 2016-08-15 DIAGNOSIS — R1110 Vomiting, unspecified: Secondary | ICD-10-CM | POA: Diagnosis not present

## 2016-08-15 DIAGNOSIS — R112 Nausea with vomiting, unspecified: Secondary | ICD-10-CM | POA: Diagnosis not present

## 2016-08-15 DIAGNOSIS — E1142 Type 2 diabetes mellitus with diabetic polyneuropathy: Secondary | ICD-10-CM | POA: Diagnosis not present

## 2016-08-16 DIAGNOSIS — K5900 Constipation, unspecified: Secondary | ICD-10-CM | POA: Diagnosis not present

## 2016-08-16 DIAGNOSIS — A0819 Acute gastroenteropathy due to other small round viruses: Secondary | ICD-10-CM | POA: Diagnosis not present

## 2016-08-16 DIAGNOSIS — Z79899 Other long term (current) drug therapy: Secondary | ICD-10-CM | POA: Diagnosis not present

## 2016-08-16 DIAGNOSIS — R1013 Epigastric pain: Secondary | ICD-10-CM | POA: Diagnosis not present

## 2016-08-16 DIAGNOSIS — E1140 Type 2 diabetes mellitus with diabetic neuropathy, unspecified: Secondary | ICD-10-CM | POA: Diagnosis not present

## 2016-08-16 DIAGNOSIS — R112 Nausea with vomiting, unspecified: Secondary | ICD-10-CM | POA: Diagnosis not present

## 2016-08-16 DIAGNOSIS — R0989 Other specified symptoms and signs involving the circulatory and respiratory systems: Secondary | ICD-10-CM | POA: Diagnosis not present

## 2016-08-16 DIAGNOSIS — R140 Abdominal distension (gaseous): Secondary | ICD-10-CM | POA: Diagnosis not present

## 2016-08-16 DIAGNOSIS — E1143 Type 2 diabetes mellitus with diabetic autonomic (poly)neuropathy: Secondary | ICD-10-CM | POA: Diagnosis not present

## 2016-08-16 DIAGNOSIS — I878 Other specified disorders of veins: Secondary | ICD-10-CM | POA: Diagnosis not present

## 2016-08-16 DIAGNOSIS — E1169 Type 2 diabetes mellitus with other specified complication: Secondary | ICD-10-CM | POA: Diagnosis not present

## 2016-08-16 DIAGNOSIS — Z794 Long term (current) use of insulin: Secondary | ICD-10-CM | POA: Diagnosis not present

## 2016-08-16 DIAGNOSIS — Z87891 Personal history of nicotine dependence: Secondary | ICD-10-CM | POA: Diagnosis not present

## 2016-08-18 DIAGNOSIS — F419 Anxiety disorder, unspecified: Secondary | ICD-10-CM | POA: Diagnosis not present

## 2016-08-18 DIAGNOSIS — R112 Nausea with vomiting, unspecified: Secondary | ICD-10-CM | POA: Diagnosis not present

## 2016-08-18 DIAGNOSIS — E278 Other specified disorders of adrenal gland: Secondary | ICD-10-CM | POA: Diagnosis not present

## 2016-08-18 DIAGNOSIS — E1142 Type 2 diabetes mellitus with diabetic polyneuropathy: Secondary | ICD-10-CM | POA: Diagnosis not present

## 2016-08-18 DIAGNOSIS — Z794 Long term (current) use of insulin: Secondary | ICD-10-CM | POA: Diagnosis not present

## 2016-08-18 DIAGNOSIS — K920 Hematemesis: Secondary | ICD-10-CM | POA: Diagnosis not present

## 2016-08-18 DIAGNOSIS — Z6826 Body mass index (BMI) 26.0-26.9, adult: Secondary | ICD-10-CM | POA: Diagnosis not present

## 2016-08-18 DIAGNOSIS — K5900 Constipation, unspecified: Secondary | ICD-10-CM | POA: Diagnosis not present

## 2016-08-18 DIAGNOSIS — D1779 Benign lipomatous neoplasm of other sites: Secondary | ICD-10-CM | POA: Insufficient documentation

## 2016-08-18 DIAGNOSIS — Z87891 Personal history of nicotine dependence: Secondary | ICD-10-CM | POA: Diagnosis not present

## 2016-08-18 DIAGNOSIS — R030 Elevated blood-pressure reading, without diagnosis of hypertension: Secondary | ICD-10-CM | POA: Diagnosis not present

## 2016-08-18 DIAGNOSIS — R161 Splenomegaly, not elsewhere classified: Secondary | ICD-10-CM | POA: Diagnosis not present

## 2016-08-18 DIAGNOSIS — E1143 Type 2 diabetes mellitus with diabetic autonomic (poly)neuropathy: Secondary | ICD-10-CM | POA: Diagnosis not present

## 2016-08-18 DIAGNOSIS — E872 Acidosis: Secondary | ICD-10-CM | POA: Diagnosis not present

## 2016-08-18 DIAGNOSIS — E1165 Type 2 diabetes mellitus with hyperglycemia: Secondary | ICD-10-CM | POA: Diagnosis not present

## 2016-08-18 DIAGNOSIS — F329 Major depressive disorder, single episode, unspecified: Secondary | ICD-10-CM | POA: Diagnosis not present

## 2016-08-18 DIAGNOSIS — E279 Disorder of adrenal gland, unspecified: Secondary | ICD-10-CM | POA: Diagnosis not present

## 2016-08-18 DIAGNOSIS — I878 Other specified disorders of veins: Secondary | ICD-10-CM | POA: Diagnosis not present

## 2016-08-18 DIAGNOSIS — Z880 Allergy status to penicillin: Secondary | ICD-10-CM | POA: Diagnosis not present

## 2016-08-19 DIAGNOSIS — K920 Hematemesis: Secondary | ICD-10-CM | POA: Diagnosis not present

## 2016-08-19 DIAGNOSIS — R112 Nausea with vomiting, unspecified: Secondary | ICD-10-CM | POA: Diagnosis not present

## 2016-08-19 DIAGNOSIS — R161 Splenomegaly, not elsewhere classified: Secondary | ICD-10-CM | POA: Diagnosis not present

## 2016-08-19 DIAGNOSIS — E1142 Type 2 diabetes mellitus with diabetic polyneuropathy: Secondary | ICD-10-CM | POA: Diagnosis not present

## 2016-08-19 DIAGNOSIS — E872 Acidosis: Secondary | ICD-10-CM | POA: Diagnosis not present

## 2016-08-19 DIAGNOSIS — E278 Other specified disorders of adrenal gland: Secondary | ICD-10-CM | POA: Diagnosis not present

## 2016-08-19 DIAGNOSIS — E1165 Type 2 diabetes mellitus with hyperglycemia: Secondary | ICD-10-CM | POA: Diagnosis not present

## 2016-08-20 DIAGNOSIS — E1165 Type 2 diabetes mellitus with hyperglycemia: Secondary | ICD-10-CM | POA: Diagnosis not present

## 2016-08-20 DIAGNOSIS — E1142 Type 2 diabetes mellitus with diabetic polyneuropathy: Secondary | ICD-10-CM | POA: Diagnosis not present

## 2016-08-20 DIAGNOSIS — E872 Acidosis: Secondary | ICD-10-CM | POA: Diagnosis not present

## 2016-08-20 DIAGNOSIS — R112 Nausea with vomiting, unspecified: Secondary | ICD-10-CM | POA: Diagnosis not present

## 2016-08-20 DIAGNOSIS — K920 Hematemesis: Secondary | ICD-10-CM | POA: Diagnosis not present

## 2016-08-21 DIAGNOSIS — R112 Nausea with vomiting, unspecified: Secondary | ICD-10-CM | POA: Diagnosis not present

## 2016-08-21 DIAGNOSIS — I10 Essential (primary) hypertension: Secondary | ICD-10-CM | POA: Insufficient documentation

## 2016-08-21 DIAGNOSIS — K920 Hematemesis: Secondary | ICD-10-CM | POA: Diagnosis not present

## 2016-08-21 DIAGNOSIS — Z794 Long term (current) use of insulin: Secondary | ICD-10-CM | POA: Diagnosis not present

## 2016-08-21 DIAGNOSIS — E1165 Type 2 diabetes mellitus with hyperglycemia: Secondary | ICD-10-CM | POA: Diagnosis not present

## 2016-08-21 DIAGNOSIS — E1142 Type 2 diabetes mellitus with diabetic polyneuropathy: Secondary | ICD-10-CM | POA: Diagnosis not present

## 2016-08-22 DIAGNOSIS — R112 Nausea with vomiting, unspecified: Secondary | ICD-10-CM | POA: Diagnosis not present

## 2016-08-22 DIAGNOSIS — Z79899 Other long term (current) drug therapy: Secondary | ICD-10-CM | POA: Diagnosis not present

## 2016-08-22 DIAGNOSIS — Z794 Long term (current) use of insulin: Secondary | ICD-10-CM | POA: Diagnosis not present

## 2016-08-22 DIAGNOSIS — Z87891 Personal history of nicotine dependence: Secondary | ICD-10-CM | POA: Diagnosis not present

## 2016-08-22 DIAGNOSIS — K3184 Gastroparesis: Secondary | ICD-10-CM | POA: Diagnosis not present

## 2016-08-22 DIAGNOSIS — R000 Tachycardia, unspecified: Secondary | ICD-10-CM | POA: Diagnosis not present

## 2016-08-22 DIAGNOSIS — I10 Essential (primary) hypertension: Secondary | ICD-10-CM | POA: Diagnosis not present

## 2016-08-22 DIAGNOSIS — E1143 Type 2 diabetes mellitus with diabetic autonomic (poly)neuropathy: Secondary | ICD-10-CM | POA: Diagnosis not present

## 2016-08-22 DIAGNOSIS — Z880 Allergy status to penicillin: Secondary | ICD-10-CM | POA: Diagnosis not present

## 2016-08-22 DIAGNOSIS — Z6825 Body mass index (BMI) 25.0-25.9, adult: Secondary | ICD-10-CM | POA: Diagnosis not present

## 2016-08-26 ENCOUNTER — Encounter: Payer: Self-pay | Admitting: Emergency Medicine

## 2016-08-26 ENCOUNTER — Ambulatory Visit
Admission: EM | Admit: 2016-08-26 | Discharge: 2016-08-26 | Disposition: A | Discharge status: Home / Self Care | Payer: BLUE CROSS/BLUE SHIELD | Attending: Family Medicine | Admitting: Family Medicine

## 2016-08-26 DIAGNOSIS — I10 Essential (primary) hypertension: Secondary | ICD-10-CM

## 2016-08-26 DIAGNOSIS — R600 Localized edema: Secondary | ICD-10-CM | POA: Diagnosis not present

## 2016-08-26 DIAGNOSIS — S93422D Sprain of deltoid ligament of left ankle, subsequent encounter: Secondary | ICD-10-CM | POA: Diagnosis not present

## 2016-08-26 DIAGNOSIS — M25572 Pain in left ankle and joints of left foot: Secondary | ICD-10-CM | POA: Diagnosis not present

## 2016-08-26 MED ORDER — HYDROCHLOROTHIAZIDE 12.5 MG PO TABS: ORAL_TABLET | ORAL | 0 refills | Status: DC

## 2016-08-26 NOTE — ED Triage Notes (Signed)
Blood pressure has been elevated for 1 week. Was taking Lisinopril and recently found out she was allergic to medication. Has been without medication for 5 days.

## 2016-08-26 NOTE — ED Provider Notes (Signed)
CSN: [id number]     Arrival date & time 08/26/16  1808 History   First MD Initiated Contact with Patient 08/26/16 1947     Chief Complaint  Patient presents with  . Hypertension   (Consider location/radiation/quality/duration/timing/severity/associated sxs/prior Treatment) HPI  56 year old female who presents with a complaint of hypertension and allergic reaction to her blood pressure medication, lisinopril. She's recently been discharged from [facility] where she had undergone 3 admissions since mid December because of intractable nausea and vomiting as well as diabetes gastro paresis. She states that she has been reading that lisinopril causes swelling nausea vomiting cough all of which that she has had so she has discontinued it last 5 days. Blood pressure on admission was 179/84. She is in the process of trying to find a local physician to take over her care. She does have an appointment with an endocrinologist, Dr. Ronnald Ramp, tomorrow. His current potassium was from a CMP dated 08/22/2016 at [facility] of 3.9.Creatinine was normal       Past Medical History:  Diagnosis Date  . Diabetes mellitus without complication ([place])   . Staph infection    Past Surgical History:  Procedure Laterality Date  . ABDOMINAL HYSTERECTOMY    . COLONOSCOPY W/ POLYPECTOMY    . HEMORRHOID SURGERY     Family History  Problem Relation Age of Onset  . Heart failure Mother   . Diabetes Mother    Social History  Substance Use Topics  . Smoking status: Former Research scientist (life sciences)  . Smokeless tobacco: Never Used  . Alcohol use No   OB History    No data available     Review of Systems  Constitutional: Positive for activity change. Negative for chills, fatigue and fever.  Gastrointestinal: Positive for nausea and vomiting.  All other systems reviewed and are negative.   Allergies  Lisinopril and Penicillins  Home Medications   Prior to Admission medications   Medication Sig Start Date End  Date Taking? Authorizing Provider  sertraline (ZOLOFT) 50 MG tablet Take 50 mg by mouth daily.   Yes Historical Provider, MD  doxycycline (VIBRAMYCIN) 100 MG capsule Take 100 mg by mouth 2 (two) times daily.    Historical Provider, MD  hydrochlorothiazide (HYDRODIURIL) 12.5 MG tablet Take 1 tablet (12.5 mg) daily 08/26/16   Lorin Picket, PA-C   Meds Ordered and Administered this Visit  Medications - No data to display  BP (!) 165/80   Pulse 84   Temp 97.5 F (36.4 C) (Tympanic)   Resp 16   Ht 5\' 6"  (1.676 m)   Wt 161 lb (73 kg)   SpO2 98%   BMI 25.99 kg/m  No data found.   Physical Exam  Constitutional: She is oriented to person, place, and time. She appears well-developed and well-nourished. No distress.  HENT:  Head: Normocephalic and atraumatic.  Right Ear: External ear normal.  Left Ear: External ear normal.  Eyes: EOM are normal. Pupils are equal, round, and reactive to light. Right eye exhibits no discharge. Left eye exhibits no discharge.  Neck: Normal range of motion. Neck supple.  Pulmonary/Chest: Effort normal and breath sounds normal. No respiratory distress. She has no wheezes. She has no rales.  Musculoskeletal: Normal range of motion.  Neurological: She is alert and oriented to person, place, and time.  Skin: Skin is warm and dry. She is not diaphoretic.  Psychiatric: She has a normal mood and affect. Her behavior is normal. Judgment and thought content normal.  Nursing note and vitals reviewed.   Urgent Care Course   Clinical Course     Procedures (including critical care time)  Labs Review Labs Reviewed - No data to display  Imaging Review No results found.   Visual Acuity Review  Right Eye Distance:   Left Eye Distance:   Bilateral Distance:    Right Eye Near:   Left Eye Near:    Bilateral Near:     Repeat blood pressure at 2020 hrs. was 165/80    MDM   1. Essential hypertension    Discharge Medication List as of 08/26/2016  8:15  PM    START taking these medications   Details  hydrochlorothiazide (HYDRODIURIL) 12.5 MG tablet Take 1 tablet (12.5 mg) daily, Print      Plan: 1. Test/x-ray results and diagnosis reviewed with patient 2. rx as per orders; risks, benefits, potential side effects reviewed with patient 3. Recommend supportive treatment with watching salt in diet. Will begin her on low-dose hydrochlorothiazide 12.5 mg daily she was evaluated and seen by primary care physician of her choice. 4. F/u prn if symptoms worsen or don't improve     Lorin Picket, PA-C 08/26/16 2034    Lorin Picket, PA-C 08/26/16 2036

## 2016-08-27 DIAGNOSIS — E0590 Thyrotoxicosis, unspecified without thyrotoxic crisis or storm: Secondary | ICD-10-CM | POA: Diagnosis not present

## 2016-08-27 DIAGNOSIS — E1165 Type 2 diabetes mellitus with hyperglycemia: Secondary | ICD-10-CM | POA: Diagnosis not present

## 2016-08-27 DIAGNOSIS — E279 Disorder of adrenal gland, unspecified: Secondary | ICD-10-CM | POA: Diagnosis not present

## 2016-08-27 DIAGNOSIS — E118 Type 2 diabetes mellitus with unspecified complications: Secondary | ICD-10-CM | POA: Diagnosis not present

## 2016-09-01 DIAGNOSIS — E1165 Type 2 diabetes mellitus with hyperglycemia: Secondary | ICD-10-CM | POA: Diagnosis not present

## 2016-09-01 DIAGNOSIS — Z1329 Encounter for screening for other suspected endocrine disorder: Secondary | ICD-10-CM | POA: Diagnosis not present

## 2016-09-01 DIAGNOSIS — F339 Major depressive disorder, recurrent, unspecified: Secondary | ICD-10-CM | POA: Diagnosis not present

## 2016-09-01 DIAGNOSIS — R1110 Vomiting, unspecified: Secondary | ICD-10-CM | POA: Diagnosis not present

## 2016-09-18 DIAGNOSIS — E113511 Type 2 diabetes mellitus with proliferative diabetic retinopathy with macular edema, right eye: Secondary | ICD-10-CM | POA: Diagnosis not present

## 2016-09-18 DIAGNOSIS — E113512 Type 2 diabetes mellitus with proliferative diabetic retinopathy with macular edema, left eye: Secondary | ICD-10-CM | POA: Diagnosis not present

## 2016-09-20 ENCOUNTER — Other Ambulatory Visit: Payer: Self-pay | Admitting: Internal Medicine

## 2016-09-22 ENCOUNTER — Ambulatory Visit (INDEPENDENT_AMBULATORY_CARE_PROVIDER_SITE_OTHER): Payer: BLUE CROSS/BLUE SHIELD | Admitting: Internal Medicine

## 2016-09-22 ENCOUNTER — Encounter: Payer: Self-pay | Admitting: Internal Medicine

## 2016-09-22 VITALS — BP 138/82 | HR 93 | Temp 97.4°F | Ht 67.0 in | Wt 171.0 lb

## 2016-09-22 DIAGNOSIS — E278 Other specified disorders of adrenal gland: Secondary | ICD-10-CM

## 2016-09-22 DIAGNOSIS — I10 Essential (primary) hypertension: Secondary | ICD-10-CM

## 2016-09-22 DIAGNOSIS — E1142 Type 2 diabetes mellitus with diabetic polyneuropathy: Secondary | ICD-10-CM | POA: Diagnosis not present

## 2016-09-22 DIAGNOSIS — Z794 Long term (current) use of insulin: Secondary | ICD-10-CM

## 2016-09-22 DIAGNOSIS — E279 Disorder of adrenal gland, unspecified: Secondary | ICD-10-CM

## 2016-09-22 MED ORDER — HYDROCHLOROTHIAZIDE 12.5 MG PO TABS: ORAL_TABLET | ORAL | 3 refills | Status: DC

## 2016-09-22 NOTE — Progress Notes (Signed)
Date:  09/22/2016   Name:  Kelli [name]   DOB:  11-24-1960   MRN:  [id number]   Chief Complaint: Establish Care and Diabetes Diabetes  She presents for her follow-up diabetic visit. She has type 2 diabetes mellitus. Her disease course has been worsening. Pertinent negatives for hypoglycemia include no dizziness or headaches. Pertinent negatives for diabetes include no chest pain and no fatigue. Symptoms are improving. Diabetic complications include peripheral neuropathy and retinopathy. Risk factors for coronary artery disease include diabetes mellitus. Current diabetic treatment includes intensive insulin program. She is compliant with treatment most of the time (having trouble affording medication - getting insulin from the [facility]). An ACE inhibitor/angiotensin II receptor blocker is not being taken (allergic to lisinopril and won't try ARB). Eye exam is current (being treated for retinopathy).  Hypertension  Chronicity: denies hx of HTN. Associated symptoms include peripheral edema. Pertinent negatives include no chest pain, headaches, palpitations or shortness of breath. Past treatments include ACE inhibitors. Hypertensive end-organ damage includes retinopathy.  Hyperlipidemia  This is a chronic problem. Pertinent negatives include no chest pain or shortness of breath. She is currently on no antihyperlipidemic treatment.  Depression - previously on Zoloft. She stopped this either before or after her prolonged bout of vomiting for which she was in the hospital. Nausea/Vomiting - hospitalized at [facility].  Gastric emptying study was normal.  CT showed a likely benign adrenal adenoma.  She declined dexamethasone suppression test.  She thinks that her vomiting was from over-medication with ranitidine, zoloft, lipitor and does not want to take these anymore. Neuropathy - has sx c/w peripheral neuropathy in feet and hands.  Can't take lyrica or gabapentin.  Does not want to try Cymbalta.  Shows me  a magazine article about radiation causing numbness.    Review of Systems  Constitutional: Negative for chills, fatigue, fever and unexpected weight change.  HENT: Negative for congestion.   Respiratory: Negative for chest tightness, shortness of breath and wheezing.   Cardiovascular: Positive for leg swelling. Negative for chest pain and palpitations.  Gastrointestinal: Negative for abdominal pain and blood in stool.  Genitourinary: Negative for dysuria.  Musculoskeletal: Positive for gait problem. Arthralgias: left ankle.  Skin: Negative for rash.  Neurological: Positive for numbness (and pain in hands and feet). Negative for dizziness and headaches.  Psychiatric/Behavioral: Negative for dysphoric mood, sleep disturbance and suicidal ideas.    Patient Active Problem List   Diagnosis Date Noted  . Essential hypertension 08/21/2016  . Adrenal mass ([place]) 08/18/2016  . Neuropathy ([place]) 08/13/2016  . Temporomandibular joint pain 11/02/2014  . Major depressive disorder, recurrent episode ([place]) 11/02/2014  . Type 2 diabetes mellitus with diabetic polyneuropathy ([place]) 06/28/2014  . Eustachian tube dysfunction 05/10/2014  . Depression 02/06/2014  . Hyperlipidemia 10/03/2013  . 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 Vitamin D deficiency 12/30/2012  . Corns and callosity 07/09/2012  . Hammer toe, acquired 07/09/2012    Prior to Admission medications   Medication Sig Start Date End Date Taking? Authorizing Provider  Cholecalciferol (VITAMIN D3) 1000 units CAPS Take 1,000 Units by mouth daily.   Yes Historical Provider, MD  insulin glargine (LANTUS) 100 unit/mL SOPN Inject 18 Units into the skin daily. 08/27/16 08/27/17 Yes Historical Provider, MD  insulin lispro (HUMALOG) 100 UNIT/ML KiwkPen 5 units plus correction scale. Total daily dose 30 units. 08/27/16  Yes Historical Provider, MD  hydrochlorothiazide (HYDRODIURIL) 12.5 MG tablet Take 1 tablet (12.5 mg) daily Patient not  taking: Reported on 09/22/2016 08/26/16   Lorin Picket, PA-C  ranitidine (ZANTAC) 150 MG tablet Take 150 mg by mouth 2 (two) times daily. 08/22/16   Historical Provider, MD  sertraline (ZOLOFT) 50 MG tablet Take 50 mg by mouth daily.    Historical Provider, MD    Allergies  Allergen Reactions  . Lisinopril Swelling  . Penicillins Nausea And Vomiting    Past Surgical History:  Procedure Laterality Date  . ABDOMINAL HYSTERECTOMY    . COLONOSCOPY W/ POLYPECTOMY    . HEMORRHOID SURGERY      Social History  Substance Use Topics  . Smoking status: Former Research scientist (life sciences)  . Smokeless tobacco: Never Used  . Alcohol use No     Medication list has been reviewed and updated.   Physical Exam  Constitutional: She is oriented to person, place, and time. She appears well-developed. No distress.  HENT:  Head: Normocephalic and atraumatic.  Neck: Normal range of motion. Neck supple. No thyromegaly present.  Cardiovascular: Normal rate, regular rhythm and normal heart sounds.   Pulmonary/Chest: Effort normal and breath sounds normal. No respiratory distress. She has no wheezes. She has no rales.  Abdominal: Soft. Bowel sounds are normal.  Musculoskeletal: Normal range of motion. She exhibits edema and tenderness.  Neurological: She is alert and oriented to person, place, and time.  Skin: Skin is warm and dry. No rash noted.  Psychiatric: She has a normal mood and affect. Her behavior is normal. Thought content normal.  Nursing note and vitals reviewed.   BP 138/82   Pulse 93   Temp 97.4 F (36.3 C)   Ht 5\' 7"  (1.702 m)   Wt 171 lb (77.6 kg)   SpO2 99%   BMI 26.78 kg/m   Assessment and Plan: 1. Essential hypertension Begin HCTZ Elevate, restrict sodium - hydrochlorothiazide (HYDRODIURIL) 12.5 MG tablet; Take 1 tablet (12.5 mg) daily  Dispense: 30 tablet; Refill: 3  2. Type 2 diabetes mellitus with diabetic polyneuropathy, with long-term current use of insulin (HCC) Continue Insulin  and follow up with [facility] Endocrinology  3. Adrenal mass ([place]) Advised pt to discuss Dexamethasone suppression test with Endo at next visit   Halina Maidens, MD [facility]  09/22/2016

## 2016-09-30 DIAGNOSIS — G629 Polyneuropathy, unspecified: Secondary | ICD-10-CM | POA: Diagnosis not present

## 2016-09-30 DIAGNOSIS — E1042 Type 1 diabetes mellitus with diabetic polyneuropathy: Secondary | ICD-10-CM | POA: Diagnosis not present

## 2016-09-30 DIAGNOSIS — R946 Abnormal results of thyroid function studies: Secondary | ICD-10-CM | POA: Diagnosis not present

## 2016-09-30 DIAGNOSIS — R7989 Other specified abnormal findings of blood chemistry: Secondary | ICD-10-CM | POA: Insufficient documentation

## 2016-09-30 DIAGNOSIS — E279 Disorder of adrenal gland, unspecified: Secondary | ICD-10-CM | POA: Diagnosis not present

## 2016-09-30 HISTORY — DX: Other specified abnormal findings of blood chemistry: R79.89

## 2016-10-01 ENCOUNTER — Telehealth: Payer: Self-pay | Admitting: Internal Medicine

## 2016-10-06 ENCOUNTER — Encounter: Payer: Self-pay | Admitting: Internal Medicine

## 2016-10-06 ENCOUNTER — Ambulatory Visit (INDEPENDENT_AMBULATORY_CARE_PROVIDER_SITE_OTHER): Payer: BLUE CROSS/BLUE SHIELD | Admitting: Internal Medicine

## 2016-10-06 VITALS — BP 124/68 | HR 78 | Temp 97.3°F | Ht 65.0 in | Wt 172.0 lb

## 2016-10-06 DIAGNOSIS — M7989 Other specified soft tissue disorders: Secondary | ICD-10-CM | POA: Diagnosis not present

## 2016-10-06 DIAGNOSIS — F331 Major depressive disorder, recurrent, moderate: Secondary | ICD-10-CM | POA: Diagnosis not present

## 2016-10-06 DIAGNOSIS — Z87891 Personal history of nicotine dependence: Secondary | ICD-10-CM | POA: Diagnosis not present

## 2016-10-06 DIAGNOSIS — E785 Hyperlipidemia, unspecified: Secondary | ICD-10-CM | POA: Diagnosis not present

## 2016-10-06 DIAGNOSIS — E10622 Type 1 diabetes mellitus with other skin ulcer: Secondary | ICD-10-CM | POA: Diagnosis not present

## 2016-10-06 DIAGNOSIS — M79675 Pain in left toe(s): Secondary | ICD-10-CM | POA: Diagnosis not present

## 2016-10-06 DIAGNOSIS — R600 Localized edema: Secondary | ICD-10-CM | POA: Diagnosis not present

## 2016-10-06 DIAGNOSIS — E1040 Type 1 diabetes mellitus with diabetic neuropathy, unspecified: Secondary | ICD-10-CM | POA: Diagnosis not present

## 2016-10-06 DIAGNOSIS — I10 Essential (primary) hypertension: Secondary | ICD-10-CM | POA: Diagnosis not present

## 2016-10-06 DIAGNOSIS — L97918 Non-pressure chronic ulcer of unspecified part of right lower leg with other specified severity: Secondary | ICD-10-CM | POA: Diagnosis not present

## 2016-10-06 DIAGNOSIS — M2040 Other hammer toe(s) (acquired), unspecified foot: Secondary | ICD-10-CM | POA: Diagnosis not present

## 2016-10-06 DIAGNOSIS — J309 Allergic rhinitis, unspecified: Secondary | ICD-10-CM | POA: Diagnosis not present

## 2016-10-06 DIAGNOSIS — E559 Vitamin D deficiency, unspecified: Secondary | ICD-10-CM | POA: Diagnosis not present

## 2016-10-06 DIAGNOSIS — E1042 Type 1 diabetes mellitus with diabetic polyneuropathy: Secondary | ICD-10-CM | POA: Diagnosis not present

## 2016-10-06 DIAGNOSIS — L03032 Cellulitis of left toe: Secondary | ICD-10-CM | POA: Diagnosis not present

## 2016-10-06 DIAGNOSIS — R590 Localized enlarged lymph nodes: Secondary | ICD-10-CM | POA: Diagnosis not present

## 2016-10-06 DIAGNOSIS — F418 Other specified anxiety disorders: Secondary | ICD-10-CM | POA: Diagnosis not present

## 2016-10-06 DIAGNOSIS — G629 Polyneuropathy, unspecified: Secondary | ICD-10-CM

## 2016-10-06 DIAGNOSIS — M85872 Other specified disorders of bone density and structure, left ankle and foot: Secondary | ICD-10-CM | POA: Diagnosis not present

## 2016-10-06 DIAGNOSIS — S81809A Unspecified open wound, unspecified lower leg, initial encounter: Secondary | ICD-10-CM | POA: Diagnosis not present

## 2016-10-06 DIAGNOSIS — M26609 Unspecified temporomandibular joint disorder, unspecified side: Secondary | ICD-10-CM | POA: Diagnosis not present

## 2016-10-06 DIAGNOSIS — M25572 Pain in left ankle and joints of left foot: Secondary | ICD-10-CM | POA: Diagnosis not present

## 2016-10-06 MED ORDER — DULOXETINE HCL 60 MG PO CPEP: 60.0000 mg | ORAL_CAPSULE | Freq: Every day | ORAL | 3 refills | Status: DC

## 2016-10-06 NOTE — Telephone Encounter (Signed)
error 

## 2016-10-06 NOTE — Progress Notes (Signed)
Date:  10/06/2016   Name:  Kelli [name]   DOB:  04-09-1961   MRN:  [id number]   Chief Complaint: Depression (Pt states she is losing interest in doing everyday things. Started in December 2017, and patient is not sleeping.) and Edema (Left foot swelling won't go down.) Depression         This is a new problem.  The current episode started more than 1 month ago.   The onset quality is gradual.   The problem occurs daily.  The problem has been gradually worsening since onset.  Associated symptoms include insomnia, irritable and decreased interest.  Past treatments include SSRIs - Selective serotonin reuptake inhibitors.  Previous treatment provided significant relief. Swelling - in left ankle and foot with red swollen great toe.  Pain from neuropathy but numb in toes.  No injury but had been debriding her great toe nail.  She has non healing lesions on right shin - seeing vascular surgery today.  Review of Systems  Constitutional: Negative for fever.  Respiratory: Negative for chest tightness and shortness of breath.   Cardiovascular: Negative for chest pain and palpitations.  Gastrointestinal: Negative for abdominal pain.  Endocrine: Negative for polydipsia and polyuria.  Musculoskeletal: Positive for arthralgias.  Skin: Positive for color change and wound.  Neurological: Numbness: in feet and hands as well as burning neuropathic pain in legs and fingers.  Psychiatric/Behavioral: Positive for depression, dysphoric mood and sleep disturbance. The patient has insomnia.     Patient Active Problem List   Diagnosis Date Noted  . Abnormal thyroid blood test 09/30/2016  . Essential hypertension 08/21/2016  . Adrenal mass ([place]) 08/18/2016  . Neuropathy ([place]) 08/13/2016  . Temporomandibular joint pain 11/02/2014  . Major depressive disorder, recurrent episode ([place]) 11/02/2014  . 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[place]) 06/28/2014  . Eustachian tube dysfunction 05/10/2014  .  Depression 02/06/2014  . Hyperlipidemia 10/03/2013  . 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 Vitamin D deficiency 12/30/2012  . Corns and callosity 07/09/2012  . Hammer toe, acquired 07/09/2012    Prior to Admission medications   Medication Sig Start Date End Date Taking? Authorizing Provider  Cholecalciferol (VITAMIN D3) 1000 units CAPS Take 1,000 Units by mouth daily.   Yes Historical Provider, MD  hydrochlorothiazide (HYDRODIURIL) 12.5 MG tablet Take 1 tablet (12.5 mg) daily 09/22/16  Yes Glean Hess, MD  ibuprofen (ADVIL,MOTRIN) 200 MG tablet Take 200 mg by mouth every 6 (six) hours as needed.   Yes Historical Provider, MD  insulin glargine (LANTUS) 100 unit/mL SOPN Inject 18 Units into the skin daily. 08/27/16 08/27/17 Yes Historical Provider, MD  insulin lispro (HUMALOG) 100 UNIT/ML KiwkPen 5 units plus correction scale. Total daily dose 30 units. 08/27/16  Yes Historical Provider, MD  ranitidine (ZANTAC) 150 MG tablet Take 150 mg by mouth 2 (two) times daily. 09/17/16   Historical Provider, MD    Allergies  Allergen Reactions  . Gabapentin Swelling  . Lisinopril Swelling  . Lyrica [Pregabalin] Swelling  . Metformin And Related Nausea And Vomiting  . Penicillins Nausea And Vomiting    Past Surgical History:  Procedure Laterality Date  . ABDOMINAL HYSTERECTOMY    . COLONOSCOPY W/ POLYPECTOMY    . HEMORRHOID SURGERY      Social History  Substance Use Topics  . Smoking status: Former Research scientist (life sciences)  . Smokeless tobacco: Never Used  . Alcohol use No     Medication list has been reviewed and  updated.   Physical Exam  Constitutional: She is oriented to person, place, and time. She appears well-developed. She is irritable. No distress.  HENT:  Head: Normocephalic and atraumatic.  Cardiovascular: Normal rate, regular rhythm and normal heart sounds.   Pulses:      Dorsalis pedis pulses are 1+ on the right side, and 1+ on the left side.       Posterior tibial  pulses are 0 on the right side, and 0 on the left side.  Pulmonary/Chest: Effort normal and breath sounds normal. No respiratory distress. She has no wheezes.  Musculoskeletal: Normal range of motion. She exhibits edema.       Feet:  Neurological: She is alert and oriented to person, place, and time.  Skin: Skin is warm and dry. No rash noted.     Psychiatric: Her speech is normal and behavior is normal. Thought content normal. Cognition and memory are normal. She exhibits a depressed mood.  Nursing note and vitals reviewed.   BP 124/68   Pulse 78   Temp 97.3 F (36.3 C)   Ht 5\' 5"  (1.651 m)   Wt 172 lb (78 kg)   BMI 28.62 kg/m   Assessment and Plan: 1. Moderate episode of recurrent major depressive disorder (HCC) Begin Cymbalta for mood and possible benefit for neuropathy - DULoxetine (CYMBALTA) 60 MG capsule; Take 1 capsule (60 mg total) by mouth daily.  Dispense: 30 capsule; Refill: 3  2. Neuropathy (HCC) - DULoxetine (CYMBALTA) 60 MG capsule; Take 1 capsule (60 mg total) by mouth daily.  Dispense: 30 capsule; Refill: 3  3. Type 1 diabetes mellitus with diabetic polyneuropathy, with long-term current use of insulin ([place]) Followed by Endocrinology  4. Localized edema Continue diuretic  5. Paronychia of great toe of left foot Patient wants to see what Vascular surgery assessment is I offered labs - CBC, UA and antibiotics but she might need additional labs at Vascular appt so will hold of to avoid duplicate labs   Halina Maidens, MD [facility]  10/06/2016

## 2016-10-15 ENCOUNTER — Other Ambulatory Visit: Payer: Self-pay | Admitting: Internal Medicine

## 2016-10-15 ENCOUNTER — Other Ambulatory Visit: Payer: Self-pay

## 2016-10-15 DIAGNOSIS — I10 Essential (primary) hypertension: Secondary | ICD-10-CM

## 2016-10-15 DIAGNOSIS — R946 Abnormal results of thyroid function studies: Secondary | ICD-10-CM | POA: Diagnosis not present

## 2016-10-15 MED ORDER — HYDROCHLOROTHIAZIDE 12.5 MG PO TABS: ORAL_TABLET | ORAL | 3 refills | Status: DC

## 2016-10-22 ENCOUNTER — Other Ambulatory Visit: Payer: Self-pay | Admitting: Internal Medicine

## 2016-10-22 DIAGNOSIS — G629 Polyneuropathy, unspecified: Secondary | ICD-10-CM

## 2016-10-22 DIAGNOSIS — F331 Major depressive disorder, recurrent, moderate: Secondary | ICD-10-CM

## 2016-10-22 MED ORDER — DULOXETINE HCL 60 MG PO CPEP: 60.0000 mg | ORAL_CAPSULE | Freq: Every day | ORAL | 1 refills | Status: DC

## 2016-10-27 DIAGNOSIS — Z792 Long term (current) use of antibiotics: Secondary | ICD-10-CM | POA: Diagnosis not present

## 2016-10-27 DIAGNOSIS — Z87891 Personal history of nicotine dependence: Secondary | ICD-10-CM | POA: Diagnosis not present

## 2016-10-27 DIAGNOSIS — I10 Essential (primary) hypertension: Secondary | ICD-10-CM | POA: Diagnosis not present

## 2016-10-27 DIAGNOSIS — L97219 Non-pressure chronic ulcer of right calf with unspecified severity: Secondary | ICD-10-CM | POA: Diagnosis not present

## 2016-10-27 DIAGNOSIS — E663 Overweight: Secondary | ICD-10-CM | POA: Diagnosis not present

## 2016-10-27 DIAGNOSIS — E11622 Type 2 diabetes mellitus with other skin ulcer: Secondary | ICD-10-CM | POA: Diagnosis not present

## 2016-10-27 DIAGNOSIS — E11610 Type 2 diabetes mellitus with diabetic neuropathic arthropathy: Secondary | ICD-10-CM | POA: Diagnosis not present

## 2016-10-27 DIAGNOSIS — E785 Hyperlipidemia, unspecified: Secondary | ICD-10-CM | POA: Diagnosis not present

## 2016-10-27 DIAGNOSIS — Z9889 Other specified postprocedural states: Secondary | ICD-10-CM | POA: Diagnosis not present

## 2016-10-27 DIAGNOSIS — L03032 Cellulitis of left toe: Secondary | ICD-10-CM | POA: Diagnosis not present

## 2016-10-27 DIAGNOSIS — S81801D Unspecified open wound, right lower leg, subsequent encounter: Secondary | ICD-10-CM | POA: Diagnosis not present

## 2016-10-27 DIAGNOSIS — Z880 Allergy status to penicillin: Secondary | ICD-10-CM | POA: Diagnosis not present

## 2016-10-27 DIAGNOSIS — Z888 Allergy status to other drugs, medicaments and biological substances status: Secondary | ICD-10-CM | POA: Diagnosis not present

## 2016-10-27 DIAGNOSIS — L539 Erythematous condition, unspecified: Secondary | ICD-10-CM | POA: Diagnosis not present

## 2016-10-27 DIAGNOSIS — M25572 Pain in left ankle and joints of left foot: Secondary | ICD-10-CM | POA: Diagnosis not present

## 2016-10-27 DIAGNOSIS — S92255D Nondisplaced fracture of navicular [scaphoid] of left foot, subsequent encounter for fracture with routine healing: Secondary | ICD-10-CM | POA: Diagnosis not present

## 2016-10-27 DIAGNOSIS — E1042 Type 1 diabetes mellitus with diabetic polyneuropathy: Secondary | ICD-10-CM | POA: Diagnosis not present

## 2016-10-27 DIAGNOSIS — Z794 Long term (current) use of insulin: Secondary | ICD-10-CM | POA: Diagnosis not present

## 2016-10-27 DIAGNOSIS — S92252A Displaced fracture of navicular [scaphoid] of left foot, initial encounter for closed fracture: Secondary | ICD-10-CM | POA: Diagnosis not present

## 2016-10-27 DIAGNOSIS — R609 Edema, unspecified: Secondary | ICD-10-CM | POA: Diagnosis not present

## 2016-10-27 DIAGNOSIS — M7989 Other specified soft tissue disorders: Secondary | ICD-10-CM | POA: Diagnosis not present

## 2016-10-27 DIAGNOSIS — Z90710 Acquired absence of both cervix and uterus: Secondary | ICD-10-CM | POA: Diagnosis not present

## 2016-10-28 DIAGNOSIS — E113513 Type 2 diabetes mellitus with proliferative diabetic retinopathy with macular edema, bilateral: Secondary | ICD-10-CM | POA: Diagnosis not present

## 2016-11-04 DIAGNOSIS — M79672 Pain in left foot: Secondary | ICD-10-CM | POA: Diagnosis not present

## 2016-11-04 DIAGNOSIS — Z1810 Retained metal fragments, unspecified: Secondary | ICD-10-CM | POA: Diagnosis not present

## 2016-11-04 DIAGNOSIS — M14672 Charcot's joint, left ankle and foot: Secondary | ICD-10-CM | POA: Diagnosis not present

## 2016-11-04 DIAGNOSIS — M7989 Other specified soft tissue disorders: Secondary | ICD-10-CM | POA: Diagnosis not present

## 2016-11-06 ENCOUNTER — Other Ambulatory Visit: Payer: Self-pay | Admitting: Internal Medicine

## 2016-11-06 MED ORDER — RANITIDINE HCL 150 MG PO TABS: 150.0000 mg | ORAL_TABLET | Freq: Two times a day (BID) | ORAL | 1 refills | Status: DC

## 2016-11-17 DIAGNOSIS — M7989 Other specified soft tissue disorders: Secondary | ICD-10-CM | POA: Diagnosis not present

## 2016-11-17 DIAGNOSIS — M79671 Pain in right foot: Secondary | ICD-10-CM | POA: Diagnosis not present

## 2016-11-17 DIAGNOSIS — M14672 Charcot's joint, left ankle and foot: Secondary | ICD-10-CM | POA: Diagnosis not present

## 2016-11-17 DIAGNOSIS — M79672 Pain in left foot: Secondary | ICD-10-CM | POA: Diagnosis not present

## 2016-12-01 DIAGNOSIS — E109 Type 1 diabetes mellitus without complications: Secondary | ICD-10-CM | POA: Diagnosis not present

## 2016-12-01 DIAGNOSIS — E279 Disorder of adrenal gland, unspecified: Secondary | ICD-10-CM | POA: Diagnosis not present

## 2016-12-02 DIAGNOSIS — E113513 Type 2 diabetes mellitus with proliferative diabetic retinopathy with macular edema, bilateral: Secondary | ICD-10-CM | POA: Diagnosis not present

## 2016-12-12 DIAGNOSIS — E1165 Type 2 diabetes mellitus with hyperglycemia: Secondary | ICD-10-CM | POA: Diagnosis not present

## 2016-12-12 DIAGNOSIS — Z1329 Encounter for screening for other suspected endocrine disorder: Secondary | ICD-10-CM | POA: Diagnosis not present

## 2016-12-23 DIAGNOSIS — M79672 Pain in left foot: Secondary | ICD-10-CM | POA: Diagnosis not present

## 2016-12-23 DIAGNOSIS — M7989 Other specified soft tissue disorders: Secondary | ICD-10-CM | POA: Diagnosis not present

## 2016-12-23 DIAGNOSIS — M14672 Charcot's joint, left ankle and foot: Secondary | ICD-10-CM | POA: Diagnosis not present

## 2016-12-24 DIAGNOSIS — M14672 Charcot's joint, left ankle and foot: Secondary | ICD-10-CM | POA: Diagnosis not present

## 2016-12-29 DIAGNOSIS — E559 Vitamin D deficiency, unspecified: Secondary | ICD-10-CM | POA: Diagnosis not present

## 2016-12-29 DIAGNOSIS — Z87891 Personal history of nicotine dependence: Secondary | ICD-10-CM | POA: Diagnosis not present

## 2016-12-29 DIAGNOSIS — F339 Major depressive disorder, recurrent, unspecified: Secondary | ICD-10-CM | POA: Diagnosis not present

## 2016-12-29 DIAGNOSIS — I10 Essential (primary) hypertension: Secondary | ICD-10-CM | POA: Diagnosis not present

## 2016-12-29 DIAGNOSIS — L03116 Cellulitis of left lower limb: Secondary | ICD-10-CM | POA: Diagnosis not present

## 2016-12-29 DIAGNOSIS — L97501 Non-pressure chronic ulcer of other part of unspecified foot limited to breakdown of skin: Secondary | ICD-10-CM | POA: Diagnosis not present

## 2016-12-29 DIAGNOSIS — J309 Allergic rhinitis, unspecified: Secondary | ICD-10-CM | POA: Diagnosis not present

## 2016-12-29 DIAGNOSIS — E11610 Type 2 diabetes mellitus with diabetic neuropathic arthropathy: Secondary | ICD-10-CM | POA: Diagnosis not present

## 2016-12-29 DIAGNOSIS — Z888 Allergy status to other drugs, medicaments and biological substances status: Secondary | ICD-10-CM | POA: Diagnosis not present

## 2016-12-29 DIAGNOSIS — Z794 Long term (current) use of insulin: Secondary | ICD-10-CM | POA: Diagnosis not present

## 2016-12-29 DIAGNOSIS — L03032 Cellulitis of left toe: Secondary | ICD-10-CM | POA: Diagnosis not present

## 2016-12-29 DIAGNOSIS — F418 Other specified anxiety disorders: Secondary | ICD-10-CM | POA: Diagnosis not present

## 2016-12-29 DIAGNOSIS — E11621 Type 2 diabetes mellitus with foot ulcer: Secondary | ICD-10-CM | POA: Diagnosis not present

## 2016-12-29 DIAGNOSIS — E785 Hyperlipidemia, unspecified: Secondary | ICD-10-CM | POA: Diagnosis not present

## 2016-12-29 DIAGNOSIS — Z880 Allergy status to penicillin: Secondary | ICD-10-CM | POA: Diagnosis not present

## 2016-12-29 DIAGNOSIS — Z79899 Other long term (current) drug therapy: Secondary | ICD-10-CM | POA: Diagnosis not present

## 2016-12-29 DIAGNOSIS — M79672 Pain in left foot: Secondary | ICD-10-CM | POA: Diagnosis not present

## 2016-12-29 DIAGNOSIS — L539 Erythematous condition, unspecified: Secondary | ICD-10-CM | POA: Diagnosis not present

## 2017-01-13 DIAGNOSIS — E113513 Type 2 diabetes mellitus with proliferative diabetic retinopathy with macular edema, bilateral: Secondary | ICD-10-CM | POA: Diagnosis not present

## 2017-01-23 DIAGNOSIS — E119 Type 2 diabetes mellitus without complications: Secondary | ICD-10-CM | POA: Diagnosis not present

## 2017-01-23 DIAGNOSIS — R000 Tachycardia, unspecified: Secondary | ICD-10-CM | POA: Diagnosis not present

## 2017-01-23 DIAGNOSIS — E785 Hyperlipidemia, unspecified: Secondary | ICD-10-CM | POA: Diagnosis not present

## 2017-01-23 DIAGNOSIS — Z90710 Acquired absence of both cervix and uterus: Secondary | ICD-10-CM | POA: Diagnosis not present

## 2017-01-23 DIAGNOSIS — Z79899 Other long term (current) drug therapy: Secondary | ICD-10-CM | POA: Diagnosis not present

## 2017-01-23 DIAGNOSIS — K5900 Constipation, unspecified: Secondary | ICD-10-CM | POA: Diagnosis not present

## 2017-01-23 DIAGNOSIS — D126 Benign neoplasm of colon, unspecified: Secondary | ICD-10-CM | POA: Diagnosis not present

## 2017-01-23 DIAGNOSIS — R112 Nausea with vomiting, unspecified: Secondary | ICD-10-CM | POA: Diagnosis not present

## 2017-01-23 DIAGNOSIS — Z87891 Personal history of nicotine dependence: Secondary | ICD-10-CM | POA: Diagnosis not present

## 2017-01-23 DIAGNOSIS — R1032 Left lower quadrant pain: Secondary | ICD-10-CM | POA: Diagnosis not present

## 2017-01-23 DIAGNOSIS — D3501 Benign neoplasm of right adrenal gland: Secondary | ICD-10-CM | POA: Diagnosis not present

## 2017-01-23 DIAGNOSIS — F329 Major depressive disorder, single episode, unspecified: Secondary | ICD-10-CM | POA: Diagnosis not present

## 2017-01-23 DIAGNOSIS — R109 Unspecified abdominal pain: Secondary | ICD-10-CM | POA: Diagnosis not present

## 2017-01-23 DIAGNOSIS — F419 Anxiety disorder, unspecified: Secondary | ICD-10-CM | POA: Diagnosis not present

## 2017-01-23 DIAGNOSIS — Z9851 Tubal ligation status: Secondary | ICD-10-CM | POA: Diagnosis not present

## 2017-01-23 DIAGNOSIS — K529 Noninfective gastroenteritis and colitis, unspecified: Secondary | ICD-10-CM | POA: Diagnosis not present

## 2017-01-23 DIAGNOSIS — I10 Essential (primary) hypertension: Secondary | ICD-10-CM | POA: Diagnosis not present

## 2017-01-26 DIAGNOSIS — E109 Type 1 diabetes mellitus without complications: Secondary | ICD-10-CM | POA: Diagnosis not present

## 2017-01-26 DIAGNOSIS — K5900 Constipation, unspecified: Secondary | ICD-10-CM | POA: Diagnosis not present

## 2017-01-26 DIAGNOSIS — K5904 Chronic idiopathic constipation: Secondary | ICD-10-CM | POA: Diagnosis not present

## 2017-01-26 DIAGNOSIS — D61818 Other pancytopenia: Secondary | ICD-10-CM | POA: Insufficient documentation

## 2017-01-26 DIAGNOSIS — F419 Anxiety disorder, unspecified: Secondary | ICD-10-CM | POA: Diagnosis not present

## 2017-01-26 DIAGNOSIS — R112 Nausea with vomiting, unspecified: Secondary | ICD-10-CM | POA: Diagnosis not present

## 2017-01-26 DIAGNOSIS — R000 Tachycardia, unspecified: Secondary | ICD-10-CM | POA: Diagnosis not present

## 2017-01-26 DIAGNOSIS — R231 Pallor: Secondary | ICD-10-CM | POA: Diagnosis not present

## 2017-01-27 DIAGNOSIS — K5904 Chronic idiopathic constipation: Secondary | ICD-10-CM | POA: Diagnosis not present

## 2017-01-27 DIAGNOSIS — D61818 Other pancytopenia: Secondary | ICD-10-CM | POA: Diagnosis not present

## 2017-01-27 DIAGNOSIS — R112 Nausea with vomiting, unspecified: Secondary | ICD-10-CM | POA: Diagnosis not present

## 2017-01-27 DIAGNOSIS — E109 Type 1 diabetes mellitus without complications: Secondary | ICD-10-CM | POA: Diagnosis not present

## 2017-01-28 DIAGNOSIS — E109 Type 1 diabetes mellitus without complications: Secondary | ICD-10-CM | POA: Diagnosis not present

## 2017-01-28 DIAGNOSIS — Z794 Long term (current) use of insulin: Secondary | ICD-10-CM | POA: Diagnosis not present

## 2017-01-28 DIAGNOSIS — F329 Major depressive disorder, single episode, unspecified: Secondary | ICD-10-CM | POA: Diagnosis not present

## 2017-01-28 DIAGNOSIS — F419 Anxiety disorder, unspecified: Secondary | ICD-10-CM | POA: Diagnosis not present

## 2017-01-28 DIAGNOSIS — Z87891 Personal history of nicotine dependence: Secondary | ICD-10-CM | POA: Diagnosis not present

## 2017-01-28 DIAGNOSIS — R112 Nausea with vomiting, unspecified: Secondary | ICD-10-CM | POA: Diagnosis not present

## 2017-01-28 DIAGNOSIS — Z880 Allergy status to penicillin: Secondary | ICD-10-CM | POA: Diagnosis not present

## 2017-01-28 DIAGNOSIS — D61818 Other pancytopenia: Secondary | ICD-10-CM | POA: Diagnosis not present

## 2017-01-28 DIAGNOSIS — I10 Essential (primary) hypertension: Secondary | ICD-10-CM | POA: Diagnosis not present

## 2017-01-28 DIAGNOSIS — K5904 Chronic idiopathic constipation: Secondary | ICD-10-CM | POA: Diagnosis not present

## 2017-01-29 DIAGNOSIS — R112 Nausea with vomiting, unspecified: Secondary | ICD-10-CM | POA: Diagnosis not present

## 2017-01-29 DIAGNOSIS — K5904 Chronic idiopathic constipation: Secondary | ICD-10-CM | POA: Diagnosis not present

## 2017-01-29 DIAGNOSIS — E109 Type 1 diabetes mellitus without complications: Secondary | ICD-10-CM | POA: Diagnosis not present

## 2017-01-29 DIAGNOSIS — D61818 Other pancytopenia: Secondary | ICD-10-CM | POA: Diagnosis not present

## 2017-02-02 ENCOUNTER — Telehealth: Payer: Self-pay | Admitting: Internal Medicine

## 2017-02-02 DIAGNOSIS — R112 Nausea with vomiting, unspecified: Secondary | ICD-10-CM

## 2017-02-02 NOTE — Telephone Encounter (Signed)
Note from [facility] admission says that her sx resolved after treatment of constipation.  If she can't keep food down, she needs to go back to ER.  GI referral can be done but will be weeks away.

## 2017-02-02 NOTE — Telephone Encounter (Signed)
Patien is taking "Cymbalta"  having some nausea, DM can not keep food down, she feels really sick. She also needs a referral for a GI specialist- Please call patient.

## 2017-02-02 NOTE — Telephone Encounter (Signed)
Pt is vomiting and would like referral to GI. Dr. Army Melia felt that a referral would be a long process and that pt needs to go back to the ED. Pt stated that [facility] told her they could get her in right away, we just needed to put in referral. Put in referral and let pt know that she needs to try and take her meds.

## 2017-02-03 DIAGNOSIS — I10 Essential (primary) hypertension: Secondary | ICD-10-CM | POA: Diagnosis not present

## 2017-02-03 DIAGNOSIS — R140 Abdominal distension (gaseous): Secondary | ICD-10-CM | POA: Diagnosis not present

## 2017-02-03 DIAGNOSIS — E1310 Other specified diabetes mellitus with ketoacidosis without coma: Secondary | ICD-10-CM | POA: Diagnosis not present

## 2017-02-03 DIAGNOSIS — E1010 Type 1 diabetes mellitus with ketoacidosis without coma: Secondary | ICD-10-CM | POA: Diagnosis not present

## 2017-02-03 DIAGNOSIS — R079 Chest pain, unspecified: Secondary | ICD-10-CM | POA: Diagnosis not present

## 2017-02-03 DIAGNOSIS — F329 Major depressive disorder, single episode, unspecified: Secondary | ICD-10-CM | POA: Diagnosis not present

## 2017-02-03 DIAGNOSIS — R1013 Epigastric pain: Secondary | ICD-10-CM | POA: Diagnosis not present

## 2017-02-03 DIAGNOSIS — E1110 Type 2 diabetes mellitus with ketoacidosis without coma: Secondary | ICD-10-CM | POA: Diagnosis not present

## 2017-02-03 DIAGNOSIS — E1140 Type 2 diabetes mellitus with diabetic neuropathy, unspecified: Secondary | ICD-10-CM | POA: Diagnosis not present

## 2017-02-03 DIAGNOSIS — E876 Hypokalemia: Secondary | ICD-10-CM | POA: Diagnosis not present

## 2017-02-03 DIAGNOSIS — Z833 Family history of diabetes mellitus: Secondary | ICD-10-CM | POA: Diagnosis not present

## 2017-02-03 DIAGNOSIS — E278 Other specified disorders of adrenal gland: Secondary | ICD-10-CM | POA: Diagnosis not present

## 2017-02-03 DIAGNOSIS — Z87891 Personal history of nicotine dependence: Secondary | ICD-10-CM | POA: Diagnosis not present

## 2017-02-03 DIAGNOSIS — Z794 Long term (current) use of insulin: Secondary | ICD-10-CM | POA: Diagnosis not present

## 2017-02-03 DIAGNOSIS — E873 Alkalosis: Secondary | ICD-10-CM | POA: Diagnosis not present

## 2017-02-03 DIAGNOSIS — E11319 Type 2 diabetes mellitus with unspecified diabetic retinopathy without macular edema: Secondary | ICD-10-CM | POA: Diagnosis not present

## 2017-02-03 DIAGNOSIS — G43909 Migraine, unspecified, not intractable, without status migrainosus: Secondary | ICD-10-CM | POA: Diagnosis not present

## 2017-02-03 DIAGNOSIS — E860 Dehydration: Secondary | ICD-10-CM | POA: Diagnosis not present

## 2017-02-03 DIAGNOSIS — E878 Other disorders of electrolyte and fluid balance, not elsewhere classified: Secondary | ICD-10-CM | POA: Diagnosis not present

## 2017-02-03 DIAGNOSIS — E1165 Type 2 diabetes mellitus with hyperglycemia: Secondary | ICD-10-CM | POA: Diagnosis not present

## 2017-02-03 DIAGNOSIS — D649 Anemia, unspecified: Secondary | ICD-10-CM | POA: Diagnosis not present

## 2017-02-03 DIAGNOSIS — E279 Disorder of adrenal gland, unspecified: Secondary | ICD-10-CM | POA: Diagnosis not present

## 2017-02-03 DIAGNOSIS — Z8249 Family history of ischemic heart disease and other diseases of the circulatory system: Secondary | ICD-10-CM | POA: Diagnosis not present

## 2017-02-03 DIAGNOSIS — R112 Nausea with vomiting, unspecified: Secondary | ICD-10-CM | POA: Diagnosis not present

## 2017-02-03 DIAGNOSIS — K760 Fatty (change of) liver, not elsewhere classified: Secondary | ICD-10-CM | POA: Diagnosis not present

## 2017-02-03 DIAGNOSIS — E871 Hypo-osmolality and hyponatremia: Secondary | ICD-10-CM | POA: Diagnosis not present

## 2017-02-04 DIAGNOSIS — R112 Nausea with vomiting, unspecified: Secondary | ICD-10-CM | POA: Diagnosis not present

## 2017-02-04 DIAGNOSIS — E1010 Type 1 diabetes mellitus with ketoacidosis without coma: Secondary | ICD-10-CM | POA: Diagnosis not present

## 2017-02-04 DIAGNOSIS — I10 Essential (primary) hypertension: Secondary | ICD-10-CM | POA: Diagnosis not present

## 2017-02-04 DIAGNOSIS — E876 Hypokalemia: Secondary | ICD-10-CM | POA: Diagnosis not present

## 2017-02-05 DIAGNOSIS — E1010 Type 1 diabetes mellitus with ketoacidosis without coma: Secondary | ICD-10-CM | POA: Diagnosis not present

## 2017-02-05 DIAGNOSIS — E876 Hypokalemia: Secondary | ICD-10-CM | POA: Diagnosis not present

## 2017-02-05 DIAGNOSIS — I10 Essential (primary) hypertension: Secondary | ICD-10-CM | POA: Diagnosis not present

## 2017-02-05 DIAGNOSIS — R079 Chest pain, unspecified: Secondary | ICD-10-CM | POA: Diagnosis not present

## 2017-02-05 DIAGNOSIS — R112 Nausea with vomiting, unspecified: Secondary | ICD-10-CM | POA: Diagnosis not present

## 2017-02-06 DIAGNOSIS — E1310 Other specified diabetes mellitus with ketoacidosis without coma: Secondary | ICD-10-CM | POA: Diagnosis not present

## 2017-02-06 DIAGNOSIS — R112 Nausea with vomiting, unspecified: Secondary | ICD-10-CM | POA: Diagnosis not present

## 2017-02-06 DIAGNOSIS — E876 Hypokalemia: Secondary | ICD-10-CM | POA: Diagnosis not present

## 2017-02-06 DIAGNOSIS — I10 Essential (primary) hypertension: Secondary | ICD-10-CM | POA: Diagnosis not present

## 2017-02-09 ENCOUNTER — Ambulatory Visit (INDEPENDENT_AMBULATORY_CARE_PROVIDER_SITE_OTHER): Payer: BLUE CROSS/BLUE SHIELD | Admitting: Internal Medicine

## 2017-02-09 ENCOUNTER — Other Ambulatory Visit
Admission: RE | Admit: 2017-02-09 | Discharge: 2017-02-09 | Disposition: A | Discharge status: Home / Self Care | Payer: BLUE CROSS/BLUE SHIELD | Source: Ambulatory Visit | Attending: Internal Medicine | Admitting: Internal Medicine

## 2017-02-09 ENCOUNTER — Encounter: Payer: Self-pay | Admitting: Internal Medicine

## 2017-02-09 VITALS — BP 110/54 | HR 108 | Temp 98.0°F | Ht 67.0 in | Wt 163.0 lb

## 2017-02-09 DIAGNOSIS — F331 Major depressive disorder, recurrent, moderate: Secondary | ICD-10-CM | POA: Diagnosis not present

## 2017-02-09 DIAGNOSIS — K5904 Chronic idiopathic constipation: Secondary | ICD-10-CM | POA: Insufficient documentation

## 2017-02-09 DIAGNOSIS — E11610 Type 2 diabetes mellitus with diabetic neuropathic arthropathy: Secondary | ICD-10-CM | POA: Diagnosis not present

## 2017-02-09 DIAGNOSIS — I10 Essential (primary) hypertension: Secondary | ICD-10-CM | POA: Diagnosis not present

## 2017-02-09 DIAGNOSIS — R112 Nausea with vomiting, unspecified: Secondary | ICD-10-CM | POA: Insufficient documentation

## 2017-02-09 DIAGNOSIS — F339 Major depressive disorder, recurrent, unspecified: Secondary | ICD-10-CM | POA: Insufficient documentation

## 2017-02-09 DIAGNOSIS — I872 Venous insufficiency (chronic) (peripheral): Secondary | ICD-10-CM

## 2017-02-09 LAB — BASIC METABOLIC PANEL
Anion gap: 7 (ref 5–15)
BUN: 32 mg/dL — ABNORMAL HIGH (ref 6–20)
CALCIUM: 9.5 mg/dL (ref 8.9–10.3)
CO2: 25 mmol/L (ref 22–32)
CREATININE: 0.9 mg/dL (ref 0.44–1.00)
Chloride: 102 mmol/L (ref 101–111)
Glucose, Bld: 302 mg/dL — ABNORMAL HIGH (ref 65–99)
Potassium: 4.7 mmol/L (ref 3.5–5.1)
SODIUM: 134 mmol/L — AB (ref 135–145)

## 2017-02-09 LAB — MAGNESIUM: Magnesium: 1.9 mg/dL (ref 1.7–2.4)

## 2017-02-09 NOTE — Progress Notes (Signed)
Date:  02/09/2017   Name:  Kelli [name]   DOB:  07/30/1961   MRN:  [id number]   Chief Complaint: Labs Only (Wants Magnesium and potasium checked.) and Depression (Dr. In hospital cut cymbalta in half to 30 mg because it made pt constipated. ) Depression         This is a chronic problem.  The problem occurs daily.  Past treatments include SNRIs - Serotonin and norepinephrine reuptake inhibitors (cymbalta dose reduced due to constipation). Hypertension  This is a chronic (changed from HCTZ to Losartan due to low potassium) problem. Pertinent negatives include no chest pain, palpitations or shortness of breath. Past treatments include angiotensin blockers. The current treatment provides moderate improvement.  Foot pain - has charcot foot and fractures.  Wearing a surgical boot today.  Needs to have follow up for healing. N/V and low K+ and Mg++ - attributed to constipation from cymbalta as well as IBS-C.  Cymbalta was reduced.  HCTZ stopped and losartan added.  She was not given a magnesium supplement at d/c but was given a potassium supplement.  She wants to get away from [facility].  Although she does like her Ortho and Endo.    Review of Systems  Constitutional: Negative for chills, fever and unexpected weight change.  Eyes: Negative for visual disturbance.  Respiratory: Negative for chest tightness and shortness of breath.   Cardiovascular: Positive for leg swelling. Negative for chest pain and palpitations.  Gastrointestinal: Positive for diarrhea and vomiting. Negative for constipation.  Musculoskeletal: Positive for arthralgias and gait problem.  Psychiatric/Behavioral: Positive for depression.    Patient Active Problem List   Diagnosis Date Noted  . Charcot foot due to diabetes mellitus ([place]) 02/09/2017  . Venous insufficiency of both lower extremities 02/09/2017  . Chronic idiopathic constipation 01/26/2017  . Intractable vomiting with nausea 01/26/2017  . Pancytopenia ([place])  01/26/2017  . Abnormal thyroid blood test 09/30/2016  . Essential hypertension 08/21/2016  . Adrenal mass ([place]) 08/18/2016  . Neuropathy 08/13/2016  . Temporomandibular joint pain 11/02/2014  . Major depressive disorder, recurrent episode ([place]) 11/02/2014  . 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[place]) 06/28/2014  . Eustachian tube dysfunction 05/10/2014  . Depression 02/06/2014  . Hyperlipidemia 10/03/2013  . 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 Vitamin D deficiency 12/30/2012  . Corns and callosity 07/09/2012  . Hammer toe, acquired 07/09/2012    Prior to Admission medications   Medication Sig Start Date End Date Taking? Authorizing Provider  Cholecalciferol (VITAMIN D3) 1000 units CAPS Take 1,000 Units by mouth daily.   Yes [provider]  DULoxetine (CYMBALTA) 60 MG capsule Take 1 capsule (60 mg total) by mouth daily. Patient taking differently: Take 30 mg by mouth daily.  10/22/16  Yes Glean Hess, MD  insulin glargine (LANTUS) 100 unit/mL SOPN Inject 18 Units into the skin daily. 08/27/16 08/27/17 Yes [provider]  insulin lispro (HUMALOG) 100 UNIT/ML KiwkPen 5 units plus correction scale. Total daily dose 30 units. 08/27/16  Yes [provider]  losartan (COZAAR) 25 MG tablet Take 25 mg by mouth daily. 02/07/17 02/07/18 Yes [provider]  ondansetron (ZOFRAN-ODT) 4 MG disintegrating tablet Take 4 mg by mouth as needed. 01/25/17  Yes [provider]  potassium chloride (K-DUR,KLOR-CON) 10 MEQ tablet Take 10 mEq by mouth 2 (two) times daily.   Yes [provider]    Allergies  Allergen Reactions  . Gabapentin Swelling  . Lisinopril Swelling  .  Lyrica [Pregabalin] Swelling  . Metformin And Related Nausea And Vomiting  . Penicillins Nausea And Vomiting    Past Surgical History:  Procedure Laterality Date  . ABDOMINAL HYSTERECTOMY    . COLONOSCOPY W/ POLYPECTOMY    . HEMORRHOID SURGERY       Social History  Substance Use Topics  . Smoking status: Former Research scientist (life sciences)  . Smokeless tobacco: Never Used  . Alcohol use No     Medication list has been reviewed and updated.   Physical Exam  Constitutional: She is oriented to person, place, and time. She appears well-developed. No distress.  HENT:  Head: Normocephalic and atraumatic.  Neck: Normal range of motion. Neck supple. No thyromegaly present.  Cardiovascular: Normal rate, regular rhythm and normal heart sounds.   Pulmonary/Chest: Effort normal and breath sounds normal. No respiratory distress. She has no wheezes. She has no rales.  Abdominal: Soft.  Musculoskeletal: She exhibits edema and tenderness.  Left foot swollen and tender.  No bruising  Neurological: She is alert and oriented to person, place, and time.  Skin: Skin is warm and dry. No rash noted.  2 shallow lesions with eschar ~ 1 cm each on lateral right calf  Psychiatric: She has a normal mood and affect. Her behavior is normal. Thought content normal.  Nursing note and vitals reviewed.   BP (!) 110/54   Pulse (!) 108   Temp 98 F (36.7 C)   Ht 5\' 7"  (1.702 m)   Wt 163 lb (73.9 kg)   SpO2 98%   BMI 25.53 kg/m   Assessment and Plan: 1. Intractable vomiting with nausea, unspecified vomiting type Improved but with persistent low magnesium - Basic metabolic panel - Magnesium  2. Chronic idiopathic constipation Improved with lower dose cymbalta  3. Essential hypertension controlled  4. Moderate episode of recurrent major depressive disorder (HCC) Doing better on cymbalta  No orders of the defined types were placed in this encounter.   Halina Maidens, MD [facility]  02/09/2017

## 2017-02-10 ENCOUNTER — Telehealth: Payer: Self-pay

## 2017-02-10 NOTE — Telephone Encounter (Signed)
She should try to take 1/2 zofran every 8 hours.  Eat a very bland diet and drink non-acidic fluids.  Try to manage until GI appointment.  If not able to keep most food and fluids down, will need to go back to ER.

## 2017-02-10 NOTE — Telephone Encounter (Signed)
Pt calling stating still having diarrhea and vomiting this morning. Wants advice since labs are normal.

## 2017-02-12 NOTE — Telephone Encounter (Signed)
Tried to call pt twice and inform this information. No answer and voicemail is not set up to leave msg. Awaiting pt call back for this information.

## 2017-02-13 ENCOUNTER — Inpatient Hospital Stay: Payer: BLUE CROSS/BLUE SHIELD | Admitting: Internal Medicine

## 2017-02-23 DIAGNOSIS — M14672 Charcot's joint, left ankle and foot: Secondary | ICD-10-CM | POA: Diagnosis not present

## 2017-02-23 DIAGNOSIS — M19072 Primary osteoarthritis, left ankle and foot: Secondary | ICD-10-CM | POA: Diagnosis not present

## 2017-02-26 DIAGNOSIS — K5900 Constipation, unspecified: Secondary | ICD-10-CM | POA: Diagnosis not present

## 2017-02-26 DIAGNOSIS — R112 Nausea with vomiting, unspecified: Secondary | ICD-10-CM | POA: Diagnosis not present

## 2017-02-26 DIAGNOSIS — D649 Anemia, unspecified: Secondary | ICD-10-CM | POA: Diagnosis not present

## 2017-02-26 DIAGNOSIS — Z6829 Body mass index (BMI) 29.0-29.9, adult: Secondary | ICD-10-CM | POA: Diagnosis not present

## 2017-02-27 DIAGNOSIS — K5900 Constipation, unspecified: Secondary | ICD-10-CM | POA: Diagnosis not present

## 2017-02-27 DIAGNOSIS — E109 Type 1 diabetes mellitus without complications: Secondary | ICD-10-CM | POA: Diagnosis not present

## 2017-02-27 DIAGNOSIS — E279 Disorder of adrenal gland, unspecified: Secondary | ICD-10-CM | POA: Diagnosis not present

## 2017-02-27 DIAGNOSIS — D649 Anemia, unspecified: Secondary | ICD-10-CM | POA: Diagnosis not present

## 2017-02-27 DIAGNOSIS — R112 Nausea with vomiting, unspecified: Secondary | ICD-10-CM | POA: Diagnosis not present

## 2017-02-27 LAB — CBC AND DIFFERENTIAL
Hemoglobin: 11.6 — AB (ref 12.0–16.0)
Platelets: 396 (ref 150–399)

## 2017-02-27 LAB — BASIC METABOLIC PANEL
BUN: 29 — AB (ref 4–21)
Creatinine: 0.7 (ref 0.5–1.1)

## 2017-02-27 LAB — TSH: TSH: 1.2 (ref 0.41–5.90)

## 2017-02-27 LAB — HEMOGLOBIN A1C: Hemoglobin A1C: 8.5

## 2017-03-02 ENCOUNTER — Encounter: Payer: Self-pay | Admitting: Internal Medicine

## 2017-03-02 DIAGNOSIS — E1142 Type 2 diabetes mellitus with diabetic polyneuropathy: Secondary | ICD-10-CM | POA: Diagnosis not present

## 2017-03-02 DIAGNOSIS — Z79899 Other long term (current) drug therapy: Secondary | ICD-10-CM | POA: Diagnosis not present

## 2017-03-02 DIAGNOSIS — I10 Essential (primary) hypertension: Secondary | ICD-10-CM | POA: Diagnosis not present

## 2017-03-02 DIAGNOSIS — R7309 Other abnormal glucose: Secondary | ICD-10-CM | POA: Diagnosis not present

## 2017-03-02 DIAGNOSIS — R112 Nausea with vomiting, unspecified: Secondary | ICD-10-CM | POA: Diagnosis not present

## 2017-03-02 DIAGNOSIS — Z794 Long term (current) use of insulin: Secondary | ICD-10-CM | POA: Diagnosis not present

## 2017-03-02 HISTORY — PX: ESOPHAGOGASTRODUODENOSCOPY: SHX1529

## 2017-03-04 ENCOUNTER — Other Ambulatory Visit: Payer: Self-pay | Admitting: Internal Medicine

## 2017-03-10 DIAGNOSIS — E113513 Type 2 diabetes mellitus with proliferative diabetic retinopathy with macular edema, bilateral: Secondary | ICD-10-CM | POA: Diagnosis not present

## 2017-03-18 DIAGNOSIS — M19072 Primary osteoarthritis, left ankle and foot: Secondary | ICD-10-CM | POA: Diagnosis not present

## 2017-03-18 DIAGNOSIS — M79672 Pain in left foot: Secondary | ICD-10-CM | POA: Diagnosis not present

## 2017-03-18 DIAGNOSIS — G5792 Unspecified mononeuropathy of left lower limb: Secondary | ICD-10-CM | POA: Diagnosis not present

## 2017-03-18 DIAGNOSIS — M14672 Charcot's joint, left ankle and foot: Secondary | ICD-10-CM | POA: Diagnosis not present

## 2017-03-20 ENCOUNTER — Other Ambulatory Visit: Payer: Self-pay | Admitting: Internal Medicine

## 2017-03-30 DIAGNOSIS — E279 Disorder of adrenal gland, unspecified: Secondary | ICD-10-CM | POA: Diagnosis not present

## 2017-03-30 DIAGNOSIS — E278 Other specified disorders of adrenal gland: Secondary | ICD-10-CM | POA: Diagnosis not present

## 2017-04-02 ENCOUNTER — Other Ambulatory Visit: Payer: Self-pay | Admitting: Internal Medicine

## 2017-04-02 MED ORDER — DULOXETINE HCL 60 MG PO CPEP: 60.0000 mg | ORAL_CAPSULE | Freq: Every day | ORAL | 5 refills | Status: DC

## 2017-04-15 DIAGNOSIS — M14672 Charcot's joint, left ankle and foot: Secondary | ICD-10-CM | POA: Diagnosis not present

## 2017-04-15 DIAGNOSIS — M19072 Primary osteoarthritis, left ankle and foot: Secondary | ICD-10-CM | POA: Diagnosis not present

## 2017-04-15 DIAGNOSIS — G5792 Unspecified mononeuropathy of left lower limb: Secondary | ICD-10-CM | POA: Diagnosis not present

## 2017-04-15 DIAGNOSIS — E1142 Type 2 diabetes mellitus with diabetic polyneuropathy: Secondary | ICD-10-CM | POA: Diagnosis not present

## 2017-04-22 ENCOUNTER — Other Ambulatory Visit: Payer: Self-pay | Admitting: Internal Medicine

## 2017-05-19 DIAGNOSIS — E113513 Type 2 diabetes mellitus with proliferative diabetic retinopathy with macular edema, bilateral: Secondary | ICD-10-CM | POA: Diagnosis not present

## 2017-06-16 DIAGNOSIS — E1165 Type 2 diabetes mellitus with hyperglycemia: Secondary | ICD-10-CM | POA: Diagnosis not present

## 2017-06-16 DIAGNOSIS — Z1329 Encounter for screening for other suspected endocrine disorder: Secondary | ICD-10-CM | POA: Diagnosis not present

## 2017-06-29 ENCOUNTER — Other Ambulatory Visit: Payer: Self-pay | Admitting: Internal Medicine

## 2017-06-29 DIAGNOSIS — F331 Major depressive disorder, recurrent, moderate: Secondary | ICD-10-CM

## 2017-06-29 DIAGNOSIS — G629 Polyneuropathy, unspecified: Secondary | ICD-10-CM

## 2017-06-29 NOTE — Telephone Encounter (Signed)
LVM WITH PATIENT TO SCHEDULE F/U APPT.

## 2017-06-30 ENCOUNTER — Other Ambulatory Visit: Payer: Self-pay | Admitting: Internal Medicine

## 2017-06-30 DIAGNOSIS — Z1231 Encounter for screening mammogram for malignant neoplasm of breast: Secondary | ICD-10-CM

## 2017-07-01 DIAGNOSIS — M14672 Charcot's joint, left ankle and foot: Secondary | ICD-10-CM | POA: Diagnosis not present

## 2017-07-01 DIAGNOSIS — M19072 Primary osteoarthritis, left ankle and foot: Secondary | ICD-10-CM | POA: Diagnosis not present

## 2017-07-01 DIAGNOSIS — G5792 Unspecified mononeuropathy of left lower limb: Secondary | ICD-10-CM | POA: Diagnosis not present

## 2017-07-01 DIAGNOSIS — E1142 Type 2 diabetes mellitus with diabetic polyneuropathy: Secondary | ICD-10-CM | POA: Diagnosis not present

## 2017-07-09 ENCOUNTER — Other Ambulatory Visit
Admission: RE | Admit: 2017-07-09 | Discharge: 2017-07-09 | Disposition: A | Discharge status: Home / Self Care | Payer: BLUE CROSS/BLUE SHIELD | Source: Ambulatory Visit | Attending: Internal Medicine | Admitting: Internal Medicine

## 2017-07-09 ENCOUNTER — Ambulatory Visit (INDEPENDENT_AMBULATORY_CARE_PROVIDER_SITE_OTHER): Payer: BLUE CROSS/BLUE SHIELD | Admitting: Internal Medicine

## 2017-07-09 ENCOUNTER — Encounter: Payer: Self-pay | Admitting: Internal Medicine

## 2017-07-09 VITALS — BP 130/68 | HR 96 | Ht 67.0 in | Wt 177.0 lb

## 2017-07-09 DIAGNOSIS — L98491 Non-pressure chronic ulcer of skin of other sites limited to breakdown of skin: Secondary | ICD-10-CM

## 2017-07-09 DIAGNOSIS — M791 Myalgia, unspecified site: Secondary | ICD-10-CM | POA: Insufficient documentation

## 2017-07-09 DIAGNOSIS — F331 Major depressive disorder, recurrent, moderate: Secondary | ICD-10-CM

## 2017-07-09 DIAGNOSIS — I10 Essential (primary) hypertension: Secondary | ICD-10-CM

## 2017-07-09 LAB — BASIC METABOLIC PANEL
ANION GAP: 6 (ref 5–15)
BUN: 34 mg/dL — ABNORMAL HIGH (ref 6–20)
CO2: 27 mmol/L (ref 22–32)
Calcium: 9.1 mg/dL (ref 8.9–10.3)
Chloride: 104 mmol/L (ref 101–111)
Creatinine, Ser: 0.86 mg/dL (ref 0.44–1.00)
GLUCOSE: 100 mg/dL — AB (ref 65–99)
POTASSIUM: 4 mmol/L (ref 3.5–5.1)
Sodium: 137 mmol/L (ref 135–145)

## 2017-07-09 LAB — MAGNESIUM: Magnesium: 1.9 mg/dL (ref 1.7–2.4)

## 2017-07-09 MED ORDER — MUPIROCIN CALCIUM 2 % EX CREA: 1.0000 "application " | TOPICAL_CREAM | Freq: Two times a day (BID) | CUTANEOUS | 0 refills | Status: DC

## 2017-07-09 MED ORDER — POTASSIUM CHLORIDE CRYS ER 10 MEQ PO TBCR: 10.0000 meq | EXTENDED_RELEASE_TABLET | Freq: Two times a day (BID) | ORAL | 1 refills | Status: DC

## 2017-07-09 MED ORDER — DULOXETINE HCL 60 MG PO CPEP: 60.0000 mg | ORAL_CAPSULE | Freq: Every day | ORAL | 1 refills | Status: DC

## 2017-07-09 NOTE — Progress Notes (Signed)
Date:  07/09/2017   Name:  Kelli [name]   DOB:  [birth year]   MRN:  [id number]   Chief Complaint: Depression (Needs refill on cymbalta and potassium meds. ) and Nausea (Started about a week ago. States when her potassium or magnesium is low she has this. Wants to see if can get both levels checked. )  Depression         This is a chronic problem.The problem is unchanged.  Associated symptoms include myalgias.  Associated symptoms include no fatigue, no headaches and no suicidal ideas.  Past treatments include SNRIs - Serotonin and norepinephrine reuptake inhibitors.  Compliance with treatment is good. Hypertension  Pertinent negatives include no chest pain, headaches, palpitations or shortness of breath.  Skin lesion - on lateral left calf for the past 2 years. Will not heal.  Seen by Dermatology - not cancerous.  No pain or bleeding - it heaps up then scabs over but does not heal.  The ulcer recurs with minimal bleeding at times.  She is dealing with charcot foot on the left.  Wearing a boot and seeing [place] [facility]. She continues to see Endocrinology for DM.  Review of Systems  Constitutional: Negative for chills, fatigue, fever and unexpected weight change.  Respiratory: Negative for chest tightness and shortness of breath.   Cardiovascular: Negative for chest pain and palpitations.  Gastrointestinal: Positive for nausea (concerned about Mg and K levels). Negative for abdominal pain and vomiting.  Musculoskeletal: Positive for arthralgias and myalgias.  Skin: Positive for wound. Negative for color change and rash.  Neurological: Negative for dizziness and headaches.  Psychiatric/Behavioral: Positive for depression. Negative for dysphoric mood, sleep disturbance and suicidal ideas.    Patient Active Problem List   Diagnosis Date Noted  . Charcot foot due to diabetes mellitus ([place]) 02/09/2017  . Venous insufficiency of both lower extremities 02/09/2017  . Chronic  idiopathic constipation 01/26/2017  . Intractable vomiting with nausea 01/26/2017  . Abnormal thyroid blood test 09/30/2016  . Essential hypertension 08/21/2016  . Adrenal mass ([place]) 08/18/2016  . Neuropathy 08/13/2016  . Temporomandibular joint pain 11/02/2014  . Moderate episode of recurrent major depressive disorder ([place]) 11/02/2014  . 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[place]) 06/28/2014  . Eustachian tube dysfunction 05/10/2014  . Hyperlipidemia 10/03/2013  . 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 Vitamin D deficiency 12/30/2012  . Corns and callosity 07/09/2012  . Hammer toe, acquired 07/09/2012    Prior to Admission medications   Medication Sig Start Date End Date Taking? Authorizing Provider  Cholecalciferol (VITAMIN D3) 1000 units CAPS Take 1,000 Units by mouth daily.   Yes [provider]  DULoxetine (CYMBALTA) 60 MG capsule Take 1 capsule (60 mg total) by mouth daily. 04/02/17  Yes Glean Hess, MD  insulin glargine (LANTUS) 100 unit/mL SOPN Inject 18 Units into the skin daily. 08/27/16 08/27/17 Yes [provider]  insulin lispro (HUMALOG) 100 UNIT/ML KiwkPen 15 units with meal - on sliding scale. 08/27/16  Yes [provider]  losartan (COZAAR) 25 MG tablet TAKE 1 TABLET BY MOUTH EVERY DAY 04/22/17  Yes Glean Hess, MD  potassium chloride (K-DUR,KLOR-CON) 10 MEQ tablet Take 10 mEq by mouth 2 (two) times daily.   Yes [provider]    Allergies  Allergen Reactions  . Gabapentin Swelling  . Lisinopril Swelling  . Lyrica [Pregabalin] Swelling  . Metformin And Related Nausea And Vomiting  . Penicillins Nausea And Vomiting  Past Surgical History:  Procedure Laterality Date  . ABDOMINAL HYSTERECTOMY    . COLONOSCOPY W/ POLYPECTOMY    . ESOPHAGOGASTRODUODENOSCOPY  03/02/2017   Normal  . HEMORRHOID SURGERY      Social History   Tobacco Use  . Smoking status: Former Research scientist (life sciences)  . Smokeless tobacco: Never  Used  Substance Use Topics  . Alcohol use: No  . Drug use: No     Medication list has been reviewed and updated.  PHQ 2/9 Scores 07/09/2017 09/22/2016  PHQ - 2 Score 2 0  PHQ- 9 Score 7 -    Physical Exam  Constitutional: She is oriented to person, place, and time. She appears well-developed. No distress.  HENT:  Head: Normocephalic and atraumatic.  Neck: Normal range of motion. Neck supple.  Cardiovascular: Normal rate, regular rhythm and normal heart sounds.  Pulmonary/Chest: Effort normal and breath sounds normal. No respiratory distress. She has no wheezes.  Abdominal: Soft. Bowel sounds are normal.  Neurological: She is alert and oriented to person, place, and time.  Skin: Skin is warm and dry. No rash noted.     Psychiatric: She has a normal mood and affect. Her behavior is normal. Thought content normal.  Nursing note and vitals reviewed.   BP 130/68   Pulse 96   Ht 5\' 7"  (1.702 m)   Wt 177 lb (80.3 kg)   SpO2 100%   BMI 27.72 kg/m   Assessment and Plan: 1. Moderate episode of recurrent major depressive disorder (HCC) Doing well on cymbalta 60 mg  2. Myalgia Check electrolytes and Mag - Basic metabolic panel - Magnesium  3. Essential hypertension controlled  4. Skin ulcer, limited to breakdown of skin (HCC) - mupirocin cream (BACTROBAN) 2 %; Apply 1 application 2 (two) times daily topically.  Dispense: 15 g; Refill: 0   Meds ordered this encounter  Medications  . DULoxetine (CYMBALTA) 60 MG capsule    Sig: Take 1 capsule (60 mg total) daily by mouth.    Dispense:  90 capsule    Refill:  1  . potassium chloride (K-DUR,KLOR-CON) 10 MEQ tablet    Sig: Take 1 tablet (10 mEq total) 2 (two) times daily by mouth.    Dispense:  90 tablet    Refill:  1  . mupirocin cream (BACTROBAN) 2 %    Sig: Apply 1 application 2 (two) times daily topically.    Dispense:  15 g    Refill:  0    Partially dictated using Editor, commissioning. Any errors are  unintentional.  Halina Maidens, MD [facility]  07/09/2017

## 2017-07-21 ENCOUNTER — Ambulatory Visit
Admission: RE | Admit: 2017-07-21 | Discharge: 2017-07-21 | Disposition: A | Discharge status: Home / Self Care | Payer: BLUE CROSS/BLUE SHIELD | Source: Ambulatory Visit | Attending: Internal Medicine | Admitting: Internal Medicine

## 2017-07-21 DIAGNOSIS — Z1231 Encounter for screening mammogram for malignant neoplasm of breast: Secondary | ICD-10-CM | POA: Insufficient documentation

## 2017-08-11 DIAGNOSIS — E113513 Type 2 diabetes mellitus with proliferative diabetic retinopathy with macular edema, bilateral: Secondary | ICD-10-CM | POA: Diagnosis not present

## 2017-08-12 DIAGNOSIS — M79672 Pain in left foot: Secondary | ICD-10-CM | POA: Diagnosis not present

## 2017-08-12 DIAGNOSIS — M79671 Pain in right foot: Secondary | ICD-10-CM | POA: Diagnosis not present

## 2017-08-19 ENCOUNTER — Other Ambulatory Visit: Payer: Self-pay | Admitting: Internal Medicine

## 2017-08-19 DIAGNOSIS — L98491 Non-pressure chronic ulcer of skin of other sites limited to breakdown of skin: Secondary | ICD-10-CM

## 2017-08-26 ENCOUNTER — Encounter: Payer: Self-pay | Admitting: Internal Medicine

## 2017-08-26 ENCOUNTER — Other Ambulatory Visit
Admission: RE | Admit: 2017-08-26 | Discharge: 2017-08-26 | Disposition: A | Discharge status: Home / Self Care | Payer: BLUE CROSS/BLUE SHIELD | Source: Ambulatory Visit | Attending: Internal Medicine | Admitting: Internal Medicine

## 2017-08-26 ENCOUNTER — Ambulatory Visit (INDEPENDENT_AMBULATORY_CARE_PROVIDER_SITE_OTHER): Payer: BLUE CROSS/BLUE SHIELD | Admitting: Internal Medicine

## 2017-08-26 VITALS — BP 118/70 | HR 105 | Ht 67.0 in | Wt 181.0 lb

## 2017-08-26 DIAGNOSIS — F331 Major depressive disorder, recurrent, moderate: Secondary | ICD-10-CM | POA: Diagnosis not present

## 2017-08-26 DIAGNOSIS — R635 Abnormal weight gain: Secondary | ICD-10-CM | POA: Diagnosis not present

## 2017-08-26 DIAGNOSIS — E278 Other specified disorders of adrenal gland: Secondary | ICD-10-CM

## 2017-08-26 LAB — CBC WITH DIFFERENTIAL/PLATELET
Basophils Absolute: 0 10*3/uL (ref 0–0.1)
Basophils Relative: 1 %
Eosinophils Absolute: 0.1 10*3/uL (ref 0–0.7)
Eosinophils Relative: 2 %
HCT: 34.5 % — ABNORMAL LOW (ref 35.0–47.0)
HEMOGLOBIN: 12.2 g/dL (ref 12.0–16.0)
LYMPHS ABS: 2.2 10*3/uL (ref 1.0–3.6)
LYMPHS PCT: 34 %
MCH: 29.8 pg (ref 26.0–34.0)
MCHC: 35.3 g/dL (ref 32.0–36.0)
MCV: 84.3 fL (ref 80.0–100.0)
MONOS PCT: 8 %
Monocytes Absolute: 0.5 10*3/uL (ref 0.2–0.9)
NEUTROS PCT: 55 %
Neutro Abs: 3.5 10*3/uL (ref 1.4–6.5)
Platelets: 300 10*3/uL (ref 150–440)
RBC: 4.09 MIL/uL (ref 3.80–5.20)
RDW: 13.2 % (ref 11.5–14.5)
WBC: 6.4 10*3/uL (ref 3.6–11.0)

## 2017-08-26 MED ORDER — DULOXETINE HCL 30 MG PO CPEP: 30.0000 mg | ORAL_CAPSULE | Freq: Every day | ORAL | 3 refills | Status: DC

## 2017-08-26 NOTE — Progress Notes (Signed)
Date:  08/26/2017   Name:  Kelli [name]   DOB:  1961/03/17   MRN:  [id number]   Chief Complaint: Weight Gain (Tired, weakness, and weight gain. Noticed a pattern. Would like thyroid checked.)  Wt Readings from Last 3 Encounters:  08/26/17 181 lb (82.1 kg)  07/09/17 177 lb (80.3 kg)  02/09/17 163 lb (73.9 kg)   Weight gain - has gained 18 lbs (including the boot) since June.  She has other sx consistent with thyroid disease - constipation, mood disorder.  Depression - still very down and sad on cymbalta 60 mg.  It has helped her foot pain however.  DM - on 4 injections a day of insulin.  Has Charcot foot on left - in a boot, waiting for diabetic shoes and inserts.  Last A1C 10.   Review of Systems  Constitutional: Positive for fatigue and unexpected weight change. Negative for chills and fever.  Respiratory: Negative for chest tightness, shortness of breath and wheezing.   Cardiovascular: Negative for chest pain and palpitations.  Musculoskeletal: Positive for arthralgias and gait problem.  Skin: Negative for rash.  Neurological: Negative for dizziness and headaches.  Psychiatric/Behavioral: Positive for dysphoric mood and sleep disturbance. Negative for agitation and suicidal ideas.    Patient Active Problem List   Diagnosis Date Noted  . Skin ulcer, limited to breakdown of skin ([place]) 07/09/2017  . Neuropathy of left foot 03/18/2017  . 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 Charcot foot due to diabetes mellitus ([place]) 02/09/2017  . Venous insufficiency of both lower extremities 02/09/2017  . Chronic idiopathic constipation 01/26/2017  . Intractable vomiting with nausea 01/26/2017  . Abnormal thyroid blood test 09/30/2016  . Essential hypertension 08/21/2016  . Adrenal mass ([place]) 08/18/2016  . Temporomandibular joint pain 11/02/2014  . Moderate episode of recurrent major depressive disorder ([place]) 11/02/2014  . 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[place])  06/28/2014  . Eustachian tube dysfunction 05/10/2014  . Hyperlipidemia 10/03/2013  . 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 Vitamin D deficiency 12/30/2012  . Corns and callosity 07/09/2012  . Hammer toe, acquired 07/09/2012    Prior to Admission medications   Medication Sig Start Date End Date Taking? Authorizing Provider  Cholecalciferol (VITAMIN D3) 1000 units CAPS Take 1,000 Units by mouth daily.   Yes [provider]  DULoxetine (CYMBALTA) 60 MG capsule Take 1 capsule (60 mg total) daily by mouth. 07/09/17  Yes Glean Hess, MD  Insulin Detemir (LEVEMIR FLEXTOUCH) 100 UNIT/ML Pen Inject 20 Units into the skin daily.   Yes [provider]  insulin lispro (HUMALOG) 100 UNIT/ML KiwkPen 15 units with meal - on sliding scale. 08/27/16  Yes [provider]  losartan (COZAAR) 25 MG tablet TAKE 1 TABLET BY MOUTH EVERY DAY 04/22/17  Yes Glean Hess, MD  mupirocin cream (BACTROBAN) 2 % APPLY 1 APPLICATION 2 (TWO) TIMES DAILY TOPICALLY. 08/19/17  Yes Glean Hess, MD  potassium chloride (K-DUR,KLOR-CON) 10 MEQ tablet Take 1 tablet (10 mEq total) 2 (two) times daily by mouth. 07/09/17  Yes Glean Hess, MD    Allergies  Allergen Reactions  . Gabapentin Swelling  . Lisinopril Swelling  . Lyrica [Pregabalin] Swelling  . Metformin And Related Nausea And Vomiting  . Penicillins Nausea And Vomiting    Past Surgical History:  Procedure Laterality Date  . ABDOMINAL HYSTERECTOMY    . COLONOSCOPY W/ POLYPECTOMY    . ESOPHAGOGASTRODUODENOSCOPY  03/02/2017  Normal  . HEMORRHOID SURGERY      Social History   Tobacco Use  . Smoking status: Former Research scientist (life sciences)  . Smokeless tobacco: Never Used  Substance Use Topics  . Alcohol use: No  . Drug use: No     Medication list has been reviewed and updated.  PHQ 2/9 Scores 07/09/2017 09/22/2016  PHQ - 2 Score 2 0  PHQ- 9 Score 7 -    Physical Exam  Constitutional: She is oriented to  person, place, and time. She appears well-developed and well-nourished. No distress.  HENT:  Head: Normocephalic and atraumatic.  Neck: Normal range of motion. Neck supple.  Cardiovascular: Normal rate, regular rhythm and normal heart sounds.  Pulmonary/Chest: Effort normal and breath sounds normal. No respiratory distress. She has no wheezes.  Musculoskeletal:  Surgical boot on left foot  Neurological: She is alert and oriented to person, place, and time.  Skin: Skin is warm and dry. No rash noted.  Psychiatric: Her speech is normal and behavior is normal. Thought content normal. She exhibits a depressed mood.  Nursing note and vitals reviewed.   BP 118/70   Pulse (!) 105   Ht 5\' 7"  (1.702 m)   Wt 181 lb (82.1 kg)   SpO2 97%   BMI 28.35 kg/m   Assessment and Plan: 1. Weight gain, abnormal - CBC with Differential/Platelet - Thyroid Panel With TSH  2. Moderate episode of recurrent major depressive disorder (HCC) Increase cymbalta to 90 mg - DULoxetine (CYMBALTA) 30 MG capsule; Take 1 capsule (30 mg total) by mouth daily.  Dispense: 90 capsule; Refill: 3   Meds ordered this encounter  Medications  . DULoxetine (CYMBALTA) 30 MG capsule    Sig: Take 1 capsule (30 mg total) by mouth daily.    Dispense:  90 capsule    Refill:  3    Take 30 mg once a day along with 60 mg once a day    Partially dictated using Editor, commissioning. Any errors are unintentional.  Halina Maidens, MD [facility]  08/26/2017

## 2017-08-27 LAB — THYROID PANEL WITH TSH
FREE THYROXINE INDEX: 1.7 (ref 1.2–4.9)
T3 UPTAKE RATIO: 24 % (ref 24–39)
T4 TOTAL: 6.9 ug/dL (ref 4.5–12.0)
TSH: 1.28 u[IU]/mL (ref 0.450–4.500)

## 2017-11-18 ENCOUNTER — Other Ambulatory Visit: Payer: Self-pay | Admitting: Internal Medicine

## 2017-11-18 DIAGNOSIS — F331 Major depressive disorder, recurrent, moderate: Secondary | ICD-10-CM

## 2017-11-30 DIAGNOSIS — E1142 Type 2 diabetes mellitus with diabetic polyneuropathy: Secondary | ICD-10-CM | POA: Diagnosis not present

## 2017-11-30 DIAGNOSIS — M14672 Charcot's joint, left ankle and foot: Secondary | ICD-10-CM | POA: Diagnosis not present

## 2017-11-30 DIAGNOSIS — G5792 Unspecified mononeuropathy of left lower limb: Secondary | ICD-10-CM | POA: Diagnosis not present

## 2017-11-30 DIAGNOSIS — E119 Type 2 diabetes mellitus without complications: Secondary | ICD-10-CM | POA: Diagnosis not present

## 2017-12-15 DIAGNOSIS — E113513 Type 2 diabetes mellitus with proliferative diabetic retinopathy with macular edema, bilateral: Secondary | ICD-10-CM | POA: Diagnosis not present

## 2017-12-22 ENCOUNTER — Other Ambulatory Visit: Payer: Self-pay | Admitting: Internal Medicine

## 2018-01-07 ENCOUNTER — Telehealth: Payer: Self-pay | Admitting: Internal Medicine

## 2018-01-07 NOTE — Telephone Encounter (Signed)
Kelli [name] called in complains of vomiting and needs to know if she should come in for lab work due to diabetes and low potassium levels and magnesium.

## 2018-01-08 ENCOUNTER — Encounter: Payer: Self-pay | Admitting: Internal Medicine

## 2018-01-08 ENCOUNTER — Ambulatory Visit (INDEPENDENT_AMBULATORY_CARE_PROVIDER_SITE_OTHER): Payer: BLUE CROSS/BLUE SHIELD | Admitting: Internal Medicine

## 2018-01-08 ENCOUNTER — Other Ambulatory Visit
Admission: RE | Admit: 2018-01-08 | Discharge: 2018-01-08 | Disposition: A | Discharge status: Home / Self Care | Payer: BLUE CROSS/BLUE SHIELD | Source: Ambulatory Visit | Attending: Internal Medicine | Admitting: Internal Medicine

## 2018-01-08 VITALS — BP 103/79 | HR 93 | Resp 16 | Ht 67.0 in | Wt 174.0 lb

## 2018-01-08 DIAGNOSIS — E1042 Type 1 diabetes mellitus with diabetic polyneuropathy: Secondary | ICD-10-CM | POA: Diagnosis not present

## 2018-01-08 DIAGNOSIS — E782 Mixed hyperlipidemia: Secondary | ICD-10-CM

## 2018-01-08 DIAGNOSIS — R112 Nausea with vomiting, unspecified: Secondary | ICD-10-CM | POA: Diagnosis not present

## 2018-01-08 DIAGNOSIS — E1040 Type 1 diabetes mellitus with diabetic neuropathy, unspecified: Secondary | ICD-10-CM | POA: Insufficient documentation

## 2018-01-08 LAB — CBC WITH DIFFERENTIAL/PLATELET
BASOS ABS: 0 10*3/uL (ref 0–0.1)
Basophils Relative: 1 %
EOS PCT: 1 %
Eosinophils Absolute: 0.1 10*3/uL (ref 0–0.7)
HEMATOCRIT: 36.8 % (ref 35.0–47.0)
Hemoglobin: 12.6 g/dL (ref 12.0–16.0)
LYMPHS ABS: 2.2 10*3/uL (ref 1.0–3.6)
LYMPHS PCT: 26 %
MCH: 29 pg (ref 26.0–34.0)
MCHC: 34.2 g/dL (ref 32.0–36.0)
MCV: 85 fL (ref 80.0–100.0)
Monocytes Absolute: 0.6 10*3/uL (ref 0.2–0.9)
Monocytes Relative: 7 %
NEUTROS ABS: 5.5 10*3/uL (ref 1.4–6.5)
Neutrophils Relative %: 65 %
Platelets: 339 10*3/uL (ref 150–440)
RBC: 4.33 MIL/uL (ref 3.80–5.20)
RDW: 12.8 % (ref 11.5–14.5)
WBC: 8.4 10*3/uL (ref 3.6–11.0)

## 2018-01-08 LAB — COMPREHENSIVE METABOLIC PANEL
ALBUMIN: 4 g/dL (ref 3.5–5.0)
ALT: 20 U/L (ref 14–54)
ANION GAP: 11 (ref 5–15)
AST: 28 U/L (ref 15–41)
Alkaline Phosphatase: 103 U/L (ref 38–126)
BILIRUBIN TOTAL: 0.5 mg/dL (ref 0.3–1.2)
BUN: 35 mg/dL — ABNORMAL HIGH (ref 6–20)
CO2: 26 mmol/L (ref 22–32)
Calcium: 9.5 mg/dL (ref 8.9–10.3)
Chloride: 99 mmol/L — ABNORMAL LOW (ref 101–111)
Creatinine, Ser: 1.48 mg/dL — ABNORMAL HIGH (ref 0.44–1.00)
GFR calc Af Amer: 44 mL/min — ABNORMAL LOW (ref >60)
GFR calc non Af Amer: 38 mL/min — ABNORMAL LOW (ref >60)
GLUCOSE: 290 mg/dL — AB (ref 65–99)
POTASSIUM: 4 mmol/L (ref 3.5–5.1)
SODIUM: 136 mmol/L (ref 135–145)
TOTAL PROTEIN: 7.7 g/dL (ref 6.5–8.1)

## 2018-01-08 LAB — MAGNESIUM: MAGNESIUM: 2.2 mg/dL (ref 1.7–2.4)

## 2018-01-08 MED ORDER — POLYETHYLENE GLYCOL 3350 17 GM/SCOOP PO POWD: 17.0000 g | Freq: Every day | ORAL | 3 refills | Status: DC

## 2018-01-08 NOTE — Telephone Encounter (Signed)
Called and left Vm to call office back about this. She did not answer phone when called.

## 2018-01-08 NOTE — Progress Notes (Signed)
Date:  01/08/2018   Name:  Kelli [name]   DOB:  Jan 12, 1961   MRN:  [id number]   Chief Complaint: Emesis (Had vomiting spell all day yesterday morning till night. Had this happen in past and wants labs to check potassium and Magnesium  labs as well as the kidneys since she has been diabetic. )  Emesis   This is a recurrent problem. The problem occurs 5 to 10 times per day (all day yesterday.). The emesis has an appearance of stomach contents. There has been no fever. Pertinent negatives include no abdominal pain, chest pain, chills, diarrhea, dizziness or headaches.  Diabetes  She presents for her follow-up diabetic visit. Diabetes type: type 1.5. Pertinent negatives for hypoglycemia include no dizziness or headaches. Associated symptoms include visual change (has retinopathy). Pertinent negatives for diabetes include no chest pain and no fatigue. Symptoms are stable. Her weight is stable. She monitors blood glucose at home 1-2 x per day. Her home blood glucose trend is fluctuating dramatically.     Review of Systems  Constitutional: Negative for chills, fatigue and unexpected weight change.  Respiratory: Negative for chest tightness, shortness of breath and wheezing.   Cardiovascular: Negative for chest pain, palpitations and leg swelling.  Gastrointestinal: Positive for constipation and vomiting. Negative for abdominal pain and diarrhea.  Neurological: Negative for dizziness and headaches.    Patient Active Problem List   Diagnosis Date Noted  . Skin ulcer, limited to breakdown of skin ([place]) 07/09/2017  . Neuropathy of left foot 03/18/2017  . 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 Charcot foot due to diabetes mellitus ([place]) 02/09/2017  . Venous insufficiency of both lower extremities 02/09/2017  . Chronic idiopathic constipation 01/26/2017  . Intractable vomiting with nausea 01/26/2017  . Abnormal thyroid blood test 09/30/2016  . Essential hypertension 08/21/2016  . Adrenal  mass ([place]) 08/18/2016  . Temporomandibular joint pain 11/02/2014  . Moderate episode of recurrent major depressive disorder ([place]) 11/02/2014  . 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[place]) 06/28/2014  . Eustachian tube dysfunction 05/10/2014  . Hyperlipidemia 10/03/2013  . 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 Vitamin D deficiency 12/30/2012  . Corns and callosity 07/09/2012  . Hammer toe, acquired 07/09/2012    Prior to Admission medications   Medication Sig Start Date End Date Taking? Authorizing Provider  Cholecalciferol (VITAMIN D3) 1000 units CAPS Take 1,000 Units by mouth daily.    [provider]  DULoxetine (CYMBALTA) 60 MG capsule TAKE 1 CAPSULE (60 MG TOTAL) DAILY BY MOUTH. 12/22/17   Glean Hess, MD  insulin lispro (HUMALOG) 100 UNIT/ML KiwkPen 15 units with meal - on sliding scale. 08/27/16   [provider]  losartan (COZAAR) 25 MG tablet TAKE 1 TABLET BY MOUTH EVERY DAY 04/22/17   Glean Hess, MD  mupirocin cream (BACTROBAN) 2 % APPLY 1 APPLICATION 2 (TWO) TIMES DAILY TOPICALLY. 08/19/17   Glean Hess, MD  potassium chloride (K-DUR,KLOR-CON) 10 MEQ tablet Take 1 tablet (10 mEq total) 2 (two) times daily by mouth. 07/09/17   Glean Hess, MD    Allergies  Allergen Reactions  . Gabapentin Swelling  . Lisinopril Swelling  . Lyrica [Pregabalin] Swelling  . Metformin And Related Nausea And Vomiting  . Penicillins Nausea And Vomiting    Past Surgical History:  Procedure Laterality Date  . ABDOMINAL HYSTERECTOMY    . COLONOSCOPY W/ POLYPECTOMY    . ESOPHAGOGASTRODUODENOSCOPY  03/02/2017   Normal  .  HEMORRHOID SURGERY      Social History   Tobacco Use  . Smoking status: Former Research scientist (life sciences)  . Smokeless tobacco: Never Used  Substance Use Topics  . Alcohol use: No  . Drug use: No     Medication list has been reviewed and updated.  PHQ 2/9 Scores 07/09/2017 09/22/2016  PHQ - 2 Score 2 0  PHQ- 9 Score 7 -     Physical Exam  Constitutional: She is oriented to person, place, and time. She appears well-developed. No distress.  HENT:  Head: Normocephalic and atraumatic.  Neck: Normal range of motion. Neck supple.  Cardiovascular: Normal rate, regular rhythm and normal heart sounds.  Pulmonary/Chest: Effort normal and breath sounds normal. No respiratory distress.  Abdominal: Soft. Bowel sounds are decreased. There is no tenderness. There is no rigidity and no guarding.  Musculoskeletal: She exhibits no edema.  Neurological: She is alert and oriented to person, place, and time.  Skin: Skin is warm and dry. No rash noted.  Psychiatric: She has a normal mood and affect. Her behavior is normal. Thought content normal.  Nursing note and vitals reviewed.   BP 103/79   Pulse 93   Resp 16   Ht 5\' 7"  (1.702 m)   Wt 174 lb (78.9 kg)   SpO2 98%   BMI 27.25 kg/m   Assessment and Plan: 1. 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HCC) Not seeing Endo regularly - last visit about 9 mo ago - Comprehensive metabolic panel - Hemoglobin A1c  2. Intractable vomiting with nausea, unspecified vomiting type Improved today - check labs - CBC with Differential/Platelet - Magnesium  3. Mixed hyperlipidemia Recently had labs with [facility] screening - pt to bring results when available   Meds ordered this encounter  Medications  . polyethylene glycol powder (GLYCOLAX/MIRALAX) powder    Sig: Take 17 g by mouth daily.    Dispense:  578 g    Refill:  3    Partially dictated using Editor, commissioning. Any errors are unintentional.  Halina Maidens, MD [facility]  01/08/2018

## 2018-01-08 NOTE — Telephone Encounter (Signed)
Please call and see how long and what other symptoms to determine if she needs OV. Thanks

## 2018-01-09 LAB — HEMOGLOBIN A1C
Hgb A1c MFr Bld: 11.1 % — ABNORMAL HIGH (ref 4.8–5.6)
Mean Plasma Glucose: 271.87 mg/dL

## 2018-02-02 ENCOUNTER — Other Ambulatory Visit: Payer: Self-pay | Admitting: Internal Medicine

## 2018-03-11 DIAGNOSIS — H4311 Vitreous hemorrhage, right eye: Secondary | ICD-10-CM | POA: Diagnosis not present

## 2018-03-11 DIAGNOSIS — E113513 Type 2 diabetes mellitus with proliferative diabetic retinopathy with macular edema, bilateral: Secondary | ICD-10-CM | POA: Diagnosis not present

## 2018-04-05 DIAGNOSIS — R112 Nausea with vomiting, unspecified: Secondary | ICD-10-CM | POA: Diagnosis not present

## 2018-04-05 DIAGNOSIS — L03115 Cellulitis of right lower limb: Secondary | ICD-10-CM | POA: Diagnosis not present

## 2018-04-05 DIAGNOSIS — M14672 Charcot's joint, left ankle and foot: Secondary | ICD-10-CM | POA: Diagnosis not present

## 2018-04-05 DIAGNOSIS — M7671 Peroneal tendinitis, right leg: Secondary | ICD-10-CM | POA: Diagnosis not present

## 2018-04-05 DIAGNOSIS — M62171 Other rupture of muscle (nontraumatic), right ankle and foot: Secondary | ICD-10-CM | POA: Diagnosis not present

## 2018-04-05 DIAGNOSIS — G5792 Unspecified mononeuropathy of left lower limb: Secondary | ICD-10-CM | POA: Diagnosis not present

## 2018-04-05 DIAGNOSIS — M79671 Pain in right foot: Secondary | ICD-10-CM | POA: Diagnosis not present

## 2018-04-30 ENCOUNTER — Telehealth: Payer: Self-pay

## 2018-04-30 NOTE — Telephone Encounter (Signed)
Patient called and stating she wanted [place] to do a PA with her insurance for a CGM. Spoke with Dr Army Melia and informed Pt because we are not a specialist, as in a Endocrinologist, her insurance will not approve this device from [place]. Told her she would need to see Endo and be followed by them with her diabetes before her insurance will consider covering device. Offer to refer her to Endo in [place] and she said she has been there before and was not happy with her care. Thanked me for my call and said " she will take care of it."

## 2018-05-01 ENCOUNTER — Other Ambulatory Visit: Payer: Self-pay | Admitting: Internal Medicine

## 2018-05-03 ENCOUNTER — Other Ambulatory Visit: Payer: Self-pay | Admitting: Family Medicine

## 2018-05-03 ENCOUNTER — Ambulatory Visit
Admission: RE | Admit: 2018-05-03 | Discharge: 2018-05-03 | Disposition: A | Discharge status: Home / Self Care | Payer: BLUE CROSS/BLUE SHIELD | Source: Ambulatory Visit | Attending: Family Medicine | Admitting: Family Medicine

## 2018-05-03 DIAGNOSIS — M79604 Pain in right leg: Secondary | ICD-10-CM | POA: Diagnosis not present

## 2018-05-03 DIAGNOSIS — M79671 Pain in right foot: Secondary | ICD-10-CM | POA: Diagnosis not present

## 2018-05-03 DIAGNOSIS — R600 Localized edema: Secondary | ICD-10-CM | POA: Diagnosis not present

## 2018-05-03 DIAGNOSIS — F418 Other specified anxiety disorders: Secondary | ICD-10-CM | POA: Diagnosis not present

## 2018-05-03 NOTE — Telephone Encounter (Signed)
Please call and schedule DM followup in the next 30 days. Patient needs to be seen every 4 months for DM.  Thank you.

## 2018-05-13 DIAGNOSIS — M14672 Charcot's joint, left ankle and foot: Secondary | ICD-10-CM | POA: Diagnosis not present

## 2018-05-13 DIAGNOSIS — M1460 Charcot's joint, unspecified site: Secondary | ICD-10-CM | POA: Diagnosis not present

## 2018-05-13 DIAGNOSIS — M14671 Charcot's joint, right ankle and foot: Secondary | ICD-10-CM | POA: Diagnosis not present

## 2018-05-13 DIAGNOSIS — M19072 Primary osteoarthritis, left ankle and foot: Secondary | ICD-10-CM | POA: Diagnosis not present

## 2018-09-02 ENCOUNTER — Other Ambulatory Visit
Admission: RE | Admit: 2018-09-02 | Discharge: 2018-09-02 | Disposition: A | Discharge status: Home / Self Care | Payer: BLUE CROSS/BLUE SHIELD | Attending: Internal Medicine | Admitting: Internal Medicine

## 2018-09-02 ENCOUNTER — Ambulatory Visit (INDEPENDENT_AMBULATORY_CARE_PROVIDER_SITE_OTHER): Payer: BLUE CROSS/BLUE SHIELD | Admitting: Internal Medicine

## 2018-09-02 ENCOUNTER — Encounter: Payer: Self-pay | Admitting: Internal Medicine

## 2018-09-02 ENCOUNTER — Other Ambulatory Visit: Payer: Self-pay | Admitting: Internal Medicine

## 2018-09-02 VITALS — BP 104/70 | HR 96 | Ht 67.0 in | Wt 181.0 lb

## 2018-09-02 DIAGNOSIS — E1042 Type 1 diabetes mellitus with diabetic polyneuropathy: Secondary | ICD-10-CM

## 2018-09-02 DIAGNOSIS — R0789 Other chest pain: Secondary | ICD-10-CM | POA: Insufficient documentation

## 2018-09-02 DIAGNOSIS — F331 Major depressive disorder, recurrent, moderate: Secondary | ICD-10-CM | POA: Diagnosis not present

## 2018-09-02 DIAGNOSIS — E11610 Type 2 diabetes mellitus with diabetic neuropathic arthropathy: Secondary | ICD-10-CM | POA: Diagnosis not present

## 2018-09-02 DIAGNOSIS — Z1159 Encounter for screening for other viral diseases: Secondary | ICD-10-CM | POA: Diagnosis not present

## 2018-09-02 DIAGNOSIS — I872 Venous insufficiency (chronic) (peripheral): Secondary | ICD-10-CM

## 2018-09-02 LAB — CBC WITH DIFFERENTIAL/PLATELET
Abs Immature Granulocytes: 0.03 10*3/uL (ref 0.00–0.07)
Basophils Absolute: 0 10*3/uL (ref 0.0–0.1)
Basophils Relative: 1 %
Eosinophils Absolute: 0.2 10*3/uL (ref 0.0–0.5)
Eosinophils Relative: 2 %
HEMATOCRIT: 36.3 % (ref 36.0–46.0)
Hemoglobin: 11.7 g/dL — ABNORMAL LOW (ref 12.0–15.0)
Immature Granulocytes: 0 %
Lymphocytes Relative: 29 %
Lymphs Abs: 2 10*3/uL (ref 0.7–4.0)
MCH: 28.1 pg (ref 26.0–34.0)
MCHC: 32.2 g/dL (ref 30.0–36.0)
MCV: 87.1 fL (ref 80.0–100.0)
MONO ABS: 0.6 10*3/uL (ref 0.1–1.0)
MONOS PCT: 8 %
NEUTROS ABS: 4.2 10*3/uL (ref 1.7–7.7)
Neutrophils Relative %: 60 %
Platelets: 347 10*3/uL (ref 150–400)
RBC: 4.17 MIL/uL (ref 3.87–5.11)
RDW: 13.1 % (ref 11.5–15.5)
WBC: 7 10*3/uL (ref 4.0–10.5)
nRBC: 0 % (ref 0.0–0.2)

## 2018-09-02 LAB — TSH: TSH: 1.794 u[IU]/mL (ref 0.350–4.500)

## 2018-09-02 LAB — LIPID PANEL
CHOLESTEROL: 244 mg/dL — AB (ref 0–200)
HDL: 70 mg/dL (ref >40)
LDL Cholesterol: 139 mg/dL — ABNORMAL HIGH (ref 0–99)
Total CHOL/HDL Ratio: 3.5 RATIO
Triglycerides: 175 mg/dL — ABNORMAL HIGH (ref <150)
VLDL: 35 mg/dL (ref 0–40)

## 2018-09-02 LAB — COMPREHENSIVE METABOLIC PANEL
ALT: 20 U/L (ref 0–44)
ANION GAP: 8 (ref 5–15)
AST: 28 U/L (ref 15–41)
Albumin: 3.9 g/dL (ref 3.5–5.0)
Alkaline Phosphatase: 121 U/L (ref 38–126)
BILIRUBIN TOTAL: 0.7 mg/dL (ref 0.3–1.2)
BUN: 39 mg/dL — AB (ref 6–20)
CO2: 27 mmol/L (ref 22–32)
Calcium: 9.2 mg/dL (ref 8.9–10.3)
Chloride: 105 mmol/L (ref 98–111)
Creatinine, Ser: 1.31 mg/dL — ABNORMAL HIGH (ref 0.44–1.00)
GFR calc Af Amer: 52 mL/min — ABNORMAL LOW (ref >60)
GFR, EST NON AFRICAN AMERICAN: 44 mL/min — AB (ref >60)
Glucose, Bld: 53 mg/dL — ABNORMAL LOW (ref 70–99)
Potassium: 4.3 mmol/L (ref 3.5–5.1)
Sodium: 140 mmol/L (ref 135–145)
TOTAL PROTEIN: 7.5 g/dL (ref 6.5–8.1)

## 2018-09-02 LAB — HEMOGLOBIN A1C
Hgb A1c MFr Bld: 10.8 % — ABNORMAL HIGH (ref 4.8–5.6)
MEAN PLASMA GLUCOSE: 263.26 mg/dL

## 2018-09-02 MED ORDER — MUPIROCIN CALCIUM 2 % EX CREA: 1.0000 "application " | TOPICAL_CREAM | Freq: Two times a day (BID) | CUTANEOUS | 0 refills | Status: DC

## 2018-09-02 NOTE — Progress Notes (Signed)
Date:  09/02/2018   Name:  Kelli [name]   DOB:  1961/08/03   MRN:  [id number]   Chief Complaint: Diabetes (Follow up. Says has appt with Endo next week with Dr Ronnald Ramp. ); Depression (PHQ9- 8); and Chest Pain (Happening off and on. Noticed last week it happened several times on one side of the chest ( left ) -- Sharp pain. Lasts about 1 minute. Had nausea when pain came. No SOB. )  Diabetes  She presents for her follow-up diabetic visit. 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 Her disease course has been worsening. Pertinent negatives for hypoglycemia include no dizziness or headaches. Associated symptoms include chest pain and fatigue. Diabetic complications include peripheral neuropathy. (Charcot foot) Current diabetic treatment includes insulin injections (humulin tid only - no basal). Her weight is stable. She is following a generally healthy diet. Her home blood glucose trend is fluctuating dramatically. An ACE inhibitor/angiotensin II receptor blocker is not being taken. She sees a podiatrist. Depression         This is a chronic problem.  The problem occurs daily.The problem is unchanged.  Associated symptoms include fatigue.  Associated symptoms include no headaches.     The symptoms are aggravated by social issues.  Past treatments include SNRIs - Serotonin and norepinephrine reuptake inhibitors.  Compliance with treatment is good.  Previous treatment provided moderate relief. Chest Pain   This is a new problem. Episode onset: last week had 3 brief episodes. The onset quality is sudden. The problem has been resolved. The pain is present in the lateral region (left chest). The pain is mild. The quality of the pain is described as sharp. The pain does not radiate. Associated symptoms include lower extremity edema and nausea. Pertinent negatives include no abdominal pain, cough, dizziness, fever, headaches, palpitations or shortness of breath. The pain is aggravated by nothing.  Charcot  foot - stabilized sx on the left, now wearing a boot on the right.  Followed by Ortho, not on pain meds or gabapentin.  Remains swollen, red, painful but is slowly improving. Skin lesion - on lateral right calf, long standing for ~ 5 years.  Heals partially then opens up again.  Review of Systems  Constitutional: Positive for fatigue. Negative for chills, fever and unexpected weight change.  HENT: Negative for trouble swallowing.   Respiratory: Negative for cough, chest tightness and shortness of breath.   Cardiovascular: Positive for chest pain. Negative for palpitations and leg swelling.  Gastrointestinal: Positive for nausea. Negative for abdominal pain, blood in stool, constipation and diarrhea.  Genitourinary: Negative for dysuria.  Musculoskeletal: Positive for arthralgias, gait problem and joint swelling.  Skin: Positive for wound.  Allergic/Immunologic: Negative for environmental allergies.  Neurological: Negative for dizziness and headaches.  Psychiatric/Behavioral: Positive for depression and dysphoric mood. Negative for sleep disturbance.    Patient Active Problem List   Diagnosis Date Noted  . Skin ulcer, limited to breakdown of skin ([place]) 07/09/2017  . Neuropathy of left foot 03/18/2017  . 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 Charcot foot due to diabetes mellitus ([place]) 02/09/2017  . Venous insufficiency of both lower extremities 02/09/2017  . Chronic idiopathic constipation 01/26/2017  . Intractable vomiting with nausea 01/26/2017  . Abnormal thyroid blood test 09/30/2016  . Essential hypertension 08/21/2016  . Adrenal mass ([place]) 08/18/2016  . Temporomandibular joint pain 11/02/2014  . Moderate episode of recurrent major depressive disorder ([place]) 11/02/2014  . DM type 1 with diabetic peripheral  neuropathy ([place]) 06/28/2014  . Eustachian tube dysfunction 05/10/2014  . Hyperlipidemia 10/03/2013  . 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 Vitamin D  deficiency 12/30/2012  . Corns and callosity 07/09/2012  . Hammer toe, acquired 07/09/2012    Allergies  Allergen Reactions  . Gabapentin Swelling  . Lisinopril Swelling  . Lyrica [Pregabalin] Swelling  . Metformin And Related Nausea And Vomiting  . Penicillins Nausea And Vomiting    Past Surgical History:  Procedure Laterality Date  . ABDOMINAL HYSTERECTOMY    . COLONOSCOPY W/ POLYPECTOMY    . ESOPHAGOGASTRODUODENOSCOPY  03/02/2017   Normal  . HEMORRHOID SURGERY      Social History   Tobacco Use  . Smoking status: Former Research scientist (life sciences)  . Smokeless tobacco: Never Used  Substance Use Topics  . Alcohol use: No  . Drug use: No     Medication list has been reviewed and updated.  Current Meds  Medication Sig  . Cholecalciferol (VITAMIN D3) 1000 units CAPS Take 1,000 Units by mouth daily.  . DULoxetine (CYMBALTA) 60 MG capsule TAKE 1 CAPSULE (60 MG TOTAL) DAILY BY MOUTH.  . insulin lispro (HUMALOG) 100 UNIT/ML KiwkPen 15 units with meal - on sliding scale.    PHQ 2/9 Scores 09/02/2018 01/08/2018 07/09/2017 09/22/2016  PHQ - 2 Score 4 2 2  0  PHQ- 9 Score 8 5 7  -    Physical Exam Vitals signs and nursing note reviewed.  Constitutional:      General: She is not in acute distress.    Appearance: She is well-developed.  HENT:     Head: Normocephalic and atraumatic.  Cardiovascular:     Rate and Rhythm: Normal rate and regular rhythm.     Pulses:          Dorsalis pedis pulses are 2+ on the right side and 2+ on the left side.       Posterior tibial pulses are 1+ on the right side and 1+ on the left side.     Heart sounds: Normal heart sounds.  Pulmonary:     Effort: Pulmonary effort is normal. No respiratory distress.     Breath sounds: Normal breath sounds.  Musculoskeletal: Normal range of motion.     Right foot: Charcot foot present.     Left foot: Charcot foot present.  Feet:     Right foot:     Skin integrity: Erythema and warmth present. No skin breakdown.      Toenail Condition: Right toenails are abnormally thick.     Left foot:     Skin integrity: No skin breakdown or erythema.     Toenail Condition: Left toenails are abnormally thick.  Skin:    General: Skin is warm and dry.     Findings: No rash.       Neurological:     Mental Status: She is alert and oriented to person, place, and time.  Psychiatric:        Attention and Perception: Attention normal.        Mood and Affect: Mood normal.        Behavior: Behavior normal.        Thought Content: Thought content normal.        Cognition and Memory: Cognition normal.        Judgment: Judgment normal.     BP 104/70 (BP Location: Right Arm, Patient Position: Sitting, Cuff Size: Normal)   Pulse 96   Ht 5\' 7"  (1.702 m)   Wt  181 lb (82.1 kg) Comment: wearing booot on foot today.  SpO2 99%   BMI 28.35 kg/m   Assessment and Plan: 1. Atypical chest pain Resolved; EKG unchanged from 01/2017 - EKG 12-Lead - SR 98, RSR V1; LAE - no change - CBC with Differential/Platelet - Lipid panel  2. 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[place]) Follow up with Endocrinology for more intensive insulin therapy as scheduled - Comprehensive metabolic panel - Hemoglobin A1c - Microalbumin / creatinine urine ratio  3. Charcot foot due to diabetes mellitus ([place]) Continue surgical boot and Ortho follow  up  4. Moderate episode of recurrent major depressive disorder (HCC) Continue Cymbalta - TSH  5. Venous insufficiency of both lower extremities - mupirocin cream (BACTROBAN) 2 %; Apply 1 application topically 2 (two) times daily.  Dispense: 15 g; Refill: 0  6. Need for hepatitis C screening test - Hepatitis C antibody   Partially dictated using Editor, commissioning. Any errors are unintentional.  Halina Maidens, MD [facility]  09/02/2018

## 2018-09-03 LAB — MICROALBUMIN / CREATININE URINE RATIO
Creatinine, Urine: 244.7 mg/dL
MICROALB/CREAT RATIO: 316.3 mg/g{creat} — AB (ref 0.0–30.0)
Microalbumin, Urine: 774.1 ug/mL

## 2018-09-03 LAB — HEPATITIS C ANTIBODY: HCV Ab: <0.1 s/co ratio (ref 0.0–0.9)

## 2018-09-06 DIAGNOSIS — Z6829 Body mass index (BMI) 29.0-29.9, adult: Secondary | ICD-10-CM | POA: Diagnosis not present

## 2018-09-06 DIAGNOSIS — E139 Other specified diabetes mellitus without complications: Secondary | ICD-10-CM | POA: Diagnosis not present

## 2018-09-07 NOTE — Progress Notes (Signed)
Mailed lab results to patient.

## 2018-09-08 DIAGNOSIS — E785 Hyperlipidemia, unspecified: Secondary | ICD-10-CM | POA: Diagnosis not present

## 2018-09-08 DIAGNOSIS — F339 Major depressive disorder, recurrent, unspecified: Secondary | ICD-10-CM | POA: Diagnosis not present

## 2018-09-08 DIAGNOSIS — M14679 Charcot's joint, unspecified ankle and foot: Secondary | ICD-10-CM | POA: Diagnosis not present

## 2018-09-08 DIAGNOSIS — E1165 Type 2 diabetes mellitus with hyperglycemia: Secondary | ICD-10-CM | POA: Diagnosis not present

## 2018-09-09 NOTE — Progress Notes (Signed)
Could you document call with patient on lab. Thank you.

## 2018-09-14 DIAGNOSIS — E113513 Type 2 diabetes mellitus with proliferative diabetic retinopathy with macular edema, bilateral: Secondary | ICD-10-CM | POA: Diagnosis not present

## 2018-09-14 DIAGNOSIS — E113511 Type 2 diabetes mellitus with proliferative diabetic retinopathy with macular edema, right eye: Secondary | ICD-10-CM | POA: Diagnosis not present

## 2018-09-24 ENCOUNTER — Other Ambulatory Visit: Payer: Self-pay

## 2018-09-24 ENCOUNTER — Ambulatory Visit (INDEPENDENT_AMBULATORY_CARE_PROVIDER_SITE_OTHER): Payer: BLUE CROSS/BLUE SHIELD | Admitting: Internal Medicine

## 2018-09-24 ENCOUNTER — Encounter: Payer: Self-pay | Admitting: Internal Medicine

## 2018-09-24 VITALS — BP 128/72 | HR 75 | Temp 97.6°F | Ht 67.0 in | Wt 181.0 lb

## 2018-09-24 DIAGNOSIS — J40 Bronchitis, not specified as acute or chronic: Secondary | ICD-10-CM

## 2018-09-24 DIAGNOSIS — R809 Proteinuria, unspecified: Secondary | ICD-10-CM | POA: Insufficient documentation

## 2018-09-24 MED ORDER — GUAIFENESIN-CODEINE 100-10 MG/5ML PO SYRP: 5.0000 mL | ORAL_SOLUTION | Freq: Three times a day (TID) | ORAL | 0 refills | Status: DC | PRN

## 2018-09-24 MED ORDER — LOSARTAN POTASSIUM 25 MG PO TABS: 12.5000 mg | ORAL_TABLET | Freq: Every day | ORAL | 5 refills | Status: DC

## 2018-09-24 NOTE — Progress Notes (Signed)
Date:  09/24/2018   Name:  Kelli [name]   DOB:  1961/01/30   MRN:  [id number]   Chief Complaint: Cough (X 4 days. Slight mucous but not much coming out of chest. Blowing a lot of clear mucous.)  Cough  This is a new problem. The current episode started in the past 7 days. The problem has been unchanged. The problem occurs every few minutes. The cough is non-productive. Associated symptoms include postnasal drip. Pertinent negatives include no chest pain, chills, fever, headaches, shortness of breath or wheezing. She has tried OTC cough suppressant for the symptoms. The treatment provided moderate relief. There is no history of COPD, emphysema or environmental allergies.   DM type 1.5 - seen by endocrinology, on Tresiba and has [employer] system.  Doing much better with lower blood sugars.  She did have microalbuminuria on labs and agrees to try losartan.  She had an adverse reaction to lisinopril - fatigue.  Review of Systems  Constitutional: Positive for fatigue. Negative for chills and fever.  HENT: Positive for postnasal drip.   Respiratory: Positive for cough. Negative for shortness of breath and wheezing.   Cardiovascular: Negative for chest pain.  Gastrointestinal: Negative for abdominal pain, diarrhea, nausea and vomiting.  Allergic/Immunologic: Negative for environmental allergies.  Neurological: Negative for dizziness, light-headedness and headaches.    Patient Active Problem List   Diagnosis Date Noted  . Skin ulcer, limited to breakdown of skin ([place]) 07/09/2017  . Neuropathy of left foot 03/18/2017  . 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 Charcot foot due to diabetes mellitus ([place]) 02/09/2017  . Venous insufficiency of both lower extremities 02/09/2017  . Chronic idiopathic constipation 01/26/2017  . Intractable vomiting with nausea 01/26/2017  . Abnormal thyroid blood test 09/30/2016  . Essential hypertension 08/21/2016  . Adrenal mass ([place]) 08/18/2016  .  Temporomandibular joint pain 11/02/2014  . Moderate episode of recurrent major depressive disorder ([place]) 11/02/2014  . 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[place]) 06/28/2014  . Eustachian tube dysfunction 05/10/2014  . Hyperlipidemia 10/03/2013  . 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 Vitamin D deficiency 12/30/2012  . Corns and callosity 07/09/2012  . Hammer toe, acquired 07/09/2012    Allergies  Allergen Reactions  . Gabapentin Swelling  . Lisinopril Swelling  . Lyrica [Pregabalin] Swelling  . Metformin And Related Nausea And Vomiting  . Penicillins Nausea And Vomiting    Past Surgical History:  Procedure Laterality Date  . ABDOMINAL HYSTERECTOMY    . COLONOSCOPY W/ POLYPECTOMY    . ESOPHAGOGASTRODUODENOSCOPY  03/02/2017   Normal  . HEMORRHOID SURGERY      Social History   Tobacco Use  . Smoking status: Former Research scientist (life sciences)  . Smokeless tobacco: Never Used  Substance Use Topics  . Alcohol use: No  . Drug use: No     Medication list has been reviewed and updated.  Current Meds  Medication Sig  . Cholecalciferol (VITAMIN D3) 1000 units CAPS Take 1,000 Units by mouth daily.  . DULoxetine (CYMBALTA) 60 MG capsule TAKE 1 CAPSULE (60 MG TOTAL) DAILY BY MOUTH.  . Insulin Degludec (TRESIBA) 100 UNIT/ML SOLN Inject 20 Units into the skin daily.  . insulin lispro (HUMALOG) 100 UNIT/ML KiwkPen 15 units with meal - on sliding scale.  . mupirocin cream (BACTROBAN) 2 % Apply 1 application topically 2 (two) times daily.    PHQ 2/9 Scores 09/02/2018 01/08/2018 07/09/2017 09/22/2016  PHQ - 2 Score 4 2 2  0  PHQ- 9 Score 8 5 7  -    Physical Exam Vitals signs and nursing note reviewed.  Constitutional:      General: She is not in acute distress.    Appearance: She is well-developed.  HENT:     Head: Normocephalic and atraumatic.     Right Ear: Tympanic membrane and ear canal normal.     Left Ear: Tympanic membrane and ear canal normal.     Mouth/Throat:      Mouth: Mucous membranes are moist.  Eyes:     Pupils: Pupils are equal, round, and reactive to light.  Neck:     Musculoskeletal: Normal range of motion and neck supple.  Cardiovascular:     Rate and Rhythm: Normal rate and regular rhythm.  Pulmonary:     Effort: Pulmonary effort is normal. No respiratory distress.     Breath sounds: Normal breath sounds. No wheezing.  Skin:    General: Skin is warm and dry.     Findings: No rash.  Neurological:     Mental Status: She is alert and oriented to person, place, and time.  Psychiatric:        Behavior: Behavior normal.        Thought Content: Thought content normal.     BP 128/72   Pulse 75   Temp 97.6 F (36.4 C) (Oral)   Ht 5\' 7"  (1.702 m)   Wt 181 lb (82.1 kg)   SpO2 97%   BMI 28.35 kg/m   Assessment and Plan: 1. Bronchitis Continue mucinex DM Use cough syrup at night Return if sx worsen - guaiFENesin-codeine (ROBITUSSIN AC) 100-10 MG/5ML syrup; Take 5 mLs by mouth 3 (three) times daily as needed for cough.  Dispense: 118 mL; Refill: 0  2. Microalbuminuria Begin low dose losartan - call is side effects - losartan (COZAAR) 25 MG tablet; Take 0.5 tablets (12.5 mg total) by mouth daily.  Dispense: 15 tablet; Refill: 5   Partially dictated using Editor, commissioning. Any errors are unintentional.  Halina Maidens, MD [facility]  09/24/2018

## 2018-10-04 DIAGNOSIS — M19071 Primary osteoarthritis, right ankle and foot: Secondary | ICD-10-CM | POA: Diagnosis not present

## 2018-10-04 DIAGNOSIS — M1460 Charcot's joint, unspecified site: Secondary | ICD-10-CM | POA: Diagnosis not present

## 2018-10-04 DIAGNOSIS — L03115 Cellulitis of right lower limb: Secondary | ICD-10-CM | POA: Diagnosis not present

## 2018-10-11 DIAGNOSIS — L03115 Cellulitis of right lower limb: Secondary | ICD-10-CM | POA: Diagnosis not present

## 2018-10-11 DIAGNOSIS — M14671 Charcot's joint, right ankle and foot: Secondary | ICD-10-CM | POA: Diagnosis not present

## 2018-10-14 DIAGNOSIS — M14671 Charcot's joint, right ankle and foot: Secondary | ICD-10-CM | POA: Diagnosis not present

## 2018-10-22 DIAGNOSIS — M14671 Charcot's joint, right ankle and foot: Secondary | ICD-10-CM | POA: Diagnosis not present

## 2018-12-28 DIAGNOSIS — E113513 Type 2 diabetes mellitus with proliferative diabetic retinopathy with macular edema, bilateral: Secondary | ICD-10-CM | POA: Diagnosis not present

## 2018-12-28 DIAGNOSIS — E113511 Type 2 diabetes mellitus with proliferative diabetic retinopathy with macular edema, right eye: Secondary | ICD-10-CM | POA: Diagnosis not present

## 2018-12-30 ENCOUNTER — Other Ambulatory Visit: Payer: Self-pay | Admitting: Internal Medicine

## 2019-02-08 DIAGNOSIS — E113513 Type 2 diabetes mellitus with proliferative diabetic retinopathy with macular edema, bilateral: Secondary | ICD-10-CM | POA: Diagnosis not present

## 2019-02-08 DIAGNOSIS — E113511 Type 2 diabetes mellitus with proliferative diabetic retinopathy with macular edema, right eye: Secondary | ICD-10-CM | POA: Diagnosis not present

## 2019-02-24 ENCOUNTER — Ambulatory Visit: Payer: BLUE CROSS/BLUE SHIELD | Admitting: Internal Medicine

## 2019-03-17 DIAGNOSIS — E113513 Type 2 diabetes mellitus with proliferative diabetic retinopathy with macular edema, bilateral: Secondary | ICD-10-CM | POA: Diagnosis not present

## 2019-03-17 DIAGNOSIS — H40023 Open angle with borderline findings, high risk, bilateral: Secondary | ICD-10-CM | POA: Diagnosis not present

## 2019-03-17 DIAGNOSIS — Z961 Presence of intraocular lens: Secondary | ICD-10-CM | POA: Diagnosis not present

## 2019-03-17 DIAGNOSIS — H40003 Preglaucoma, unspecified, bilateral: Secondary | ICD-10-CM | POA: Diagnosis not present

## 2019-03-25 ENCOUNTER — Other Ambulatory Visit: Payer: Self-pay | Admitting: Internal Medicine

## 2019-03-25 DIAGNOSIS — R809 Proteinuria, unspecified: Secondary | ICD-10-CM

## 2019-04-08 ENCOUNTER — Other Ambulatory Visit: Payer: Self-pay | Admitting: Internal Medicine

## 2019-04-09 ENCOUNTER — Other Ambulatory Visit: Payer: Self-pay | Admitting: Internal Medicine

## 2019-04-11 NOTE — Telephone Encounter (Signed)
LM to call back.

## 2019-04-14 DIAGNOSIS — E113511 Type 2 diabetes mellitus with proliferative diabetic retinopathy with macular edema, right eye: Secondary | ICD-10-CM | POA: Diagnosis not present

## 2019-04-14 DIAGNOSIS — E113513 Type 2 diabetes mellitus with proliferative diabetic retinopathy with macular edema, bilateral: Secondary | ICD-10-CM | POA: Diagnosis not present

## 2019-04-15 ENCOUNTER — Other Ambulatory Visit: Payer: Self-pay

## 2019-04-15 ENCOUNTER — Encounter: Payer: Self-pay | Admitting: Internal Medicine

## 2019-04-15 ENCOUNTER — Other Ambulatory Visit
Admission: RE | Admit: 2019-04-15 | Discharge: 2019-04-15 | Disposition: A | Discharge status: Home / Self Care | Payer: BC Managed Care – PPO | Attending: Internal Medicine | Admitting: Internal Medicine

## 2019-04-15 ENCOUNTER — Ambulatory Visit (INDEPENDENT_AMBULATORY_CARE_PROVIDER_SITE_OTHER): Payer: BC Managed Care – PPO | Admitting: Internal Medicine

## 2019-04-15 VITALS — BP 139/78 | HR 82 | Resp 16 | Ht 67.0 in | Wt 189.0 lb

## 2019-04-15 DIAGNOSIS — I10 Essential (primary) hypertension: Secondary | ICD-10-CM | POA: Diagnosis not present

## 2019-04-15 DIAGNOSIS — F331 Major depressive disorder, recurrent, moderate: Secondary | ICD-10-CM

## 2019-04-15 DIAGNOSIS — E1042 Type 1 diabetes mellitus with diabetic polyneuropathy: Secondary | ICD-10-CM | POA: Insufficient documentation

## 2019-04-15 LAB — COMPREHENSIVE METABOLIC PANEL
ALT: 16 U/L (ref 0–44)
AST: 22 U/L (ref 15–41)
Albumin: 3.6 g/dL (ref 3.5–5.0)
Alkaline Phosphatase: 83 U/L (ref 38–126)
Anion gap: 8 (ref 5–15)
BUN: 36 mg/dL — ABNORMAL HIGH (ref 6–20)
CO2: 24 mmol/L (ref 22–32)
Calcium: 8.8 mg/dL — ABNORMAL LOW (ref 8.9–10.3)
Chloride: 103 mmol/L (ref 98–111)
Creatinine, Ser: 1.47 mg/dL — ABNORMAL HIGH (ref 0.44–1.00)
GFR calc Af Amer: 45 mL/min — ABNORMAL LOW (ref >60)
GFR calc non Af Amer: 39 mL/min — ABNORMAL LOW (ref >60)
Glucose, Bld: 274 mg/dL — ABNORMAL HIGH (ref 70–99)
Potassium: 4.7 mmol/L (ref 3.5–5.1)
Sodium: 135 mmol/L (ref 135–145)
Total Bilirubin: 0.4 mg/dL (ref 0.3–1.2)
Total Protein: 6.8 g/dL (ref 6.5–8.1)

## 2019-04-15 LAB — MAGNESIUM: Magnesium: 2.2 mg/dL (ref 1.7–2.4)

## 2019-04-15 LAB — HEMOGLOBIN A1C
Hgb A1c MFr Bld: 8.7 % — ABNORMAL HIGH (ref 4.8–5.6)
Mean Plasma Glucose: 202.99 mg/dL

## 2019-04-15 MED ORDER — DULOXETINE HCL 30 MG PO CPEP: 30.0000 mg | ORAL_CAPSULE | Freq: Every day | ORAL | 0 refills | Status: DC

## 2019-04-15 NOTE — Patient Instructions (Signed)
Take cymbalta 30 mg once a day for 2 weeks then take cymbalta 30 mg every other day for 2 weeks then stop.

## 2019-04-15 NOTE — Progress Notes (Signed)
Date:  04/15/2019   Name:  Kelli [name]   DOB:  1961/02/09   MRN:  [id number]   Chief Complaint: Depression (having bad dreams feels it is meds)  Depression        This is a chronic problem.  The problem occurs daily.  The problem has been resolved since onset.  Associated symptoms include decreased concentration, fatigue, hopelessness, insomnia, appetite change and sad.  Associated symptoms include not irritable, no restlessness and no suicidal ideas.( She attributes most of her sx to poor sleep - sleep is fragmented by vivid dreams and nightmares)  Past treatments include SNRIs - Serotonin and norepinephrine reuptake inhibitors.  Compliance with treatment is good.  Previous treatment provided significant relief. Diabetes She has type 2 (treated as type 1) diabetes mellitus. Her disease course has been worsening. Pertinent negatives for hypoglycemia include no dizziness or nervousness/anxiousness. Associated symptoms include fatigue. Pertinent negatives for diabetes include no chest pain, no polydipsia and no polyuria. There are no hypoglycemic complications. Symptoms are worsening. Diabetic complications include nephropathy, peripheral neuropathy and retinopathy. Current diabetic treatment includes intensive insulin program. She is compliant with treatment most of the time. Weight trend: up 8 lbs since last visit. Her home blood glucose trend is decreasing steadily. An ACE inhibitor/angiotensin II receptor blocker is being taken. Eye exam is current.  Hypertension The problem is controlled. Pertinent negatives include no chest pain or shortness of breath. Risk factors for coronary artery disease include diabetes mellitus and dyslipidemia. Past treatments include angiotensin blockers. The current treatment provides significant improvement. There are no compliance problems.  Hypertensive end-organ damage includes retinopathy.   Lab Results  Component Value Date   HGBA1C 10.8 (H) 09/02/2018     Review of Systems  Constitutional: Positive for appetite change and fatigue. Negative for unexpected weight change.  Eyes: Positive for visual disturbance.  Respiratory: Negative for shortness of breath and wheezing.   Cardiovascular: Negative for chest pain.  Gastrointestinal: Negative for abdominal pain, constipation and diarrhea.  Endocrine: Negative for polydipsia and polyuria.  Neurological: Positive for numbness. Negative for dizziness.  Psychiatric/Behavioral: Positive for decreased concentration, depression and sleep disturbance. Negative for suicidal ideas. The patient has insomnia. The patient is not nervous/anxious.     Patient Active Problem List   Diagnosis Date Noted  . Microalbuminuria 09/24/2018  . Skin ulcer, limited to breakdown of skin ([place]) 07/09/2017  . Neuropathy of left foot 03/18/2017  . 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 Charcot foot due to diabetes mellitus ([place]) 02/09/2017  . Venous insufficiency of both lower extremities 02/09/2017  . Chronic idiopathic constipation 01/26/2017  . Intractable vomiting with nausea 01/26/2017  . Abnormal thyroid blood test 09/30/2016  . Essential hypertension 08/21/2016  . Adrenal mass ([place]) 08/18/2016  . Temporomandibular joint pain 11/02/2014  . Moderate episode of recurrent major depressive disorder ([place]) 11/02/2014  . 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[place]) 06/28/2014  . Eustachian tube dysfunction 05/10/2014  . Hyperlipidemia 10/03/2013  . 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 Vitamin D deficiency 12/30/2012  . Corns and callosity 07/09/2012  . Hammer toe, acquired 07/09/2012    Allergies  Allergen Reactions  . Gabapentin Swelling  . Lisinopril Other (See Comments)    fatigue  . Lyrica [Pregabalin] Swelling  . Metformin And Related Nausea And Vomiting  . Penicillins Nausea And Vomiting    Past Surgical History:  Procedure Laterality Date  . ABDOMINAL HYSTERECTOMY    .  COLONOSCOPY W/  POLYPECTOMY    . ESOPHAGOGASTRODUODENOSCOPY  03/02/2017   Normal  . HEMORRHOID SURGERY      Social History   Tobacco Use  . Smoking status: Former Research scientist (life sciences)  . Smokeless tobacco: Never Used  Substance Use Topics  . Alcohol use: No  . Drug use: No     Medication list has been reviewed and updated.  Current Meds  Medication Sig  . brimonidine (ALPHAGAN) 0.2 % ophthalmic solution INSTILL 1 DROP INTO RIGHT EYE TWICE A DAY  . Cholecalciferol (VITAMIN D3) 1000 units CAPS Take 1,000 Units by mouth daily.  . Continuous Blood Gluc Sensor (FREESTYLE LIBRE 14 DAY SENSOR) MISC USE AS DIRECTED EVERY 14 DAYS  . Continuous Blood Gluc Sensor ([place]) MISC by Other route every fourteen (14) days.  . dorzolamide-timolol (COSOPT) 22.3-6.8 MG/ML ophthalmic solution INSTILL 1 DROP INTO LEFT EYE TWICE A DAY  . DULoxetine (CYMBALTA) 60 MG capsule TAKE 1 CAPSULE (60 MG TOTAL) DAILY BY MOUTH.  . Insulin Degludec (TRESIBA) 100 UNIT/ML SOLN Inject 20 Units into the skin daily.  . insulin lispro (HUMALOG) 100 UNIT/ML KiwkPen 15 units with meal - on sliding scale.  . losartan (COZAAR) 25 MG tablet TAKE 1/2 TABLET BY MOUTH EVERY DAY  . [DISCONTINUED] mupirocin cream (BACTROBAN) 2 % Apply 1 application topically 2 (two) times daily.    PHQ 2/9 Scores 04/15/2019 09/02/2018 01/08/2018 07/09/2017  PHQ - 2 Score 2 4 2 2   PHQ- 9 Score 15 8 5 7     BP Readings from Last 3 Encounters:  04/15/19 139/78  09/24/18 128/72  09/02/18 104/70    Physical Exam Vitals signs and nursing note reviewed.  Constitutional:      General: She is not irritable.She is not in acute distress.    Appearance: Normal appearance. She is well-developed.  HENT:     Head: Normocephalic and atraumatic.  Neck:     Musculoskeletal: Normal range of motion.  Cardiovascular:     Rate and Rhythm: Normal rate and regular rhythm.     Heart sounds: No murmur.  Pulmonary:     Effort: Pulmonary effort is  normal. No respiratory distress.     Breath sounds: No wheezing or rhonchi.  Musculoskeletal:     Right lower leg: No edema.     Left lower leg: No edema.  Lymphadenopathy:     Cervical: No cervical adenopathy.  Skin:    General: Skin is warm and dry.     Findings: No rash.  Neurological:     Mental Status: She is alert and oriented to person, place, and time.  Psychiatric:        Behavior: Behavior normal.        Thought Content: Thought content normal.     Wt Readings from Last 3 Encounters:  04/15/19 189 lb (85.7 kg)  09/24/18 181 lb (82.1 kg)  09/02/18 181 lb (82.1 kg)    BP 139/78   Pulse 82   Resp 16   Ht 5\' 7"  (1.702 m)   Wt 189 lb (85.7 kg)   SpO2 98%   BMI 29.60 kg/m   Assessment and Plan: 1. Moderate episode of recurrent major depressive disorder (HCC) Symptoms of depression are stable - per patient mostly due to lack of sleep Will taper and discontinue cymbalta - 30 mg daily x 2 weeks then every other day for 2 weeks then stop May need sleep meds but we need to exclude medication adverse effect first - DULoxetine (CYMBALTA) 30  MG capsule; Take 1 capsule (30 mg total) by mouth daily.  Dispense: 30 capsule; Refill: 0  2. Essential hypertension Clinically stable exam with well controlled BP.   Tolerating medications, cozaar 50 mg, without side effects at this time. Pt to continue current regimen and low sodium diet; benefits of regular exercise as able discussed. - Comprehensive metabolic panel  3. DM type 1 with diabetic peripheral neuropathy [facility]) Per patient her BS are improved - she is using [employer] system She is taking intensive insulin multiple daily injections  She has not had follow up with Endocrinology this year! Will obtain labs since pt is here today - this will allow her to do a virtual Endo visit at [facility] if needed and her provider will be able to see her labs - Comprehensive metabolic panel - Hemoglobin A1c  4. Hypomagnesemia History  of very low magneium in the past Pt is currently not on supplementation - Magnesium   Partially dictated using Editor, commissioning. Any errors are unintentional.  Halina Maidens, MD [facility]  04/15/2019

## 2019-04-29 DIAGNOSIS — Z961 Presence of intraocular lens: Secondary | ICD-10-CM | POA: Diagnosis not present

## 2019-04-29 DIAGNOSIS — E113513 Type 2 diabetes mellitus with proliferative diabetic retinopathy with macular edema, bilateral: Secondary | ICD-10-CM | POA: Diagnosis not present

## 2019-04-29 DIAGNOSIS — H40003 Preglaucoma, unspecified, bilateral: Secondary | ICD-10-CM | POA: Diagnosis not present

## 2019-04-29 DIAGNOSIS — H40023 Open angle with borderline findings, high risk, bilateral: Secondary | ICD-10-CM | POA: Diagnosis not present

## 2019-05-07 ENCOUNTER — Other Ambulatory Visit: Payer: Self-pay | Admitting: Internal Medicine

## 2019-05-07 DIAGNOSIS — F331 Major depressive disorder, recurrent, moderate: Secondary | ICD-10-CM

## 2019-05-12 ENCOUNTER — Other Ambulatory Visit: Payer: Self-pay | Admitting: Internal Medicine

## 2019-05-12 DIAGNOSIS — F331 Major depressive disorder, recurrent, moderate: Secondary | ICD-10-CM

## 2019-05-19 DIAGNOSIS — Z961 Presence of intraocular lens: Secondary | ICD-10-CM | POA: Diagnosis not present

## 2019-05-19 DIAGNOSIS — H40023 Open angle with borderline findings, high risk, bilateral: Secondary | ICD-10-CM | POA: Diagnosis not present

## 2019-05-19 DIAGNOSIS — E113513 Type 2 diabetes mellitus with proliferative diabetic retinopathy with macular edema, bilateral: Secondary | ICD-10-CM | POA: Diagnosis not present

## 2019-06-02 DIAGNOSIS — E113513 Type 2 diabetes mellitus with proliferative diabetic retinopathy with macular edema, bilateral: Secondary | ICD-10-CM | POA: Diagnosis not present

## 2019-06-05 ENCOUNTER — Other Ambulatory Visit: Payer: Self-pay | Admitting: Internal Medicine

## 2019-06-05 DIAGNOSIS — F331 Major depressive disorder, recurrent, moderate: Secondary | ICD-10-CM

## 2019-06-13 ENCOUNTER — Ambulatory Visit
Admission: EM | Admit: 2019-06-13 | Discharge: 2019-06-13 | Discharge status: Against Medical Advice | Payer: BC Managed Care – PPO

## 2019-06-13 ENCOUNTER — Other Ambulatory Visit: Payer: Self-pay

## 2019-06-13 NOTE — ED Triage Notes (Signed)
Patient complains of eye pain. Patient states that she found a round piece of plastic in her eye yesterday. Patient states that she has retina problems and receives shot in her left eye already. Patient states that pain has been constant since last 6 days. States that she saw a doctor last week and told her she has a scratch and Rx ed a drop (Tobramycin) for her eye. Due to see her Retina Doctor tomorrow.

## 2019-06-14 DIAGNOSIS — H40023 Open angle with borderline findings, high risk, bilateral: Secondary | ICD-10-CM | POA: Diagnosis not present

## 2019-06-14 DIAGNOSIS — H5711 Ocular pain, right eye: Secondary | ICD-10-CM | POA: Diagnosis not present

## 2019-06-14 DIAGNOSIS — S0501XA Injury of conjunctiva and corneal abrasion without foreign body, right eye, initial encounter: Secondary | ICD-10-CM | POA: Diagnosis not present

## 2019-06-14 DIAGNOSIS — E113513 Type 2 diabetes mellitus with proliferative diabetic retinopathy with macular edema, bilateral: Secondary | ICD-10-CM | POA: Diagnosis not present

## 2019-06-15 DIAGNOSIS — H5711 Ocular pain, right eye: Secondary | ICD-10-CM | POA: Diagnosis not present

## 2019-06-15 DIAGNOSIS — H40023 Open angle with borderline findings, high risk, bilateral: Secondary | ICD-10-CM | POA: Diagnosis not present

## 2019-06-15 DIAGNOSIS — S0501XD Injury of conjunctiva and corneal abrasion without foreign body, right eye, subsequent encounter: Secondary | ICD-10-CM | POA: Diagnosis not present

## 2019-06-15 DIAGNOSIS — E113513 Type 2 diabetes mellitus with proliferative diabetic retinopathy with macular edema, bilateral: Secondary | ICD-10-CM | POA: Diagnosis not present

## 2019-06-16 DIAGNOSIS — E113513 Type 2 diabetes mellitus with proliferative diabetic retinopathy with macular edema, bilateral: Secondary | ICD-10-CM | POA: Diagnosis not present

## 2019-06-16 DIAGNOSIS — H40023 Open angle with borderline findings, high risk, bilateral: Secondary | ICD-10-CM | POA: Diagnosis not present

## 2019-06-16 DIAGNOSIS — H40053 Ocular hypertension, bilateral: Secondary | ICD-10-CM | POA: Diagnosis not present

## 2019-06-16 DIAGNOSIS — S0502XA Injury of conjunctiva and corneal abrasion without foreign body, left eye, initial encounter: Secondary | ICD-10-CM | POA: Diagnosis not present

## 2019-06-16 DIAGNOSIS — H5711 Ocular pain, right eye: Secondary | ICD-10-CM | POA: Diagnosis not present

## 2019-06-16 DIAGNOSIS — S0501XD Injury of conjunctiva and corneal abrasion without foreign body, right eye, subsequent encounter: Secondary | ICD-10-CM | POA: Diagnosis not present

## 2019-06-16 DIAGNOSIS — H209 Unspecified iridocyclitis: Secondary | ICD-10-CM | POA: Diagnosis not present

## 2019-06-20 DIAGNOSIS — H209 Unspecified iridocyclitis: Secondary | ICD-10-CM | POA: Diagnosis not present

## 2019-06-22 DIAGNOSIS — H209 Unspecified iridocyclitis: Secondary | ICD-10-CM | POA: Diagnosis not present

## 2019-06-22 DIAGNOSIS — S0502XA Injury of conjunctiva and corneal abrasion without foreign body, left eye, initial encounter: Secondary | ICD-10-CM | POA: Diagnosis not present

## 2019-06-23 DIAGNOSIS — B0052 Herpesviral keratitis: Secondary | ICD-10-CM | POA: Diagnosis not present

## 2019-06-24 DIAGNOSIS — B0052 Herpesviral keratitis: Secondary | ICD-10-CM | POA: Diagnosis not present

## 2019-06-29 DIAGNOSIS — I160 Hypertensive urgency: Secondary | ICD-10-CM | POA: Diagnosis not present

## 2019-06-29 DIAGNOSIS — R112 Nausea with vomiting, unspecified: Secondary | ICD-10-CM | POA: Diagnosis not present

## 2019-06-29 DIAGNOSIS — I10 Essential (primary) hypertension: Secondary | ICD-10-CM | POA: Diagnosis not present

## 2019-06-29 DIAGNOSIS — B0052 Herpesviral keratitis: Secondary | ICD-10-CM | POA: Diagnosis not present

## 2019-06-29 DIAGNOSIS — E1140 Type 2 diabetes mellitus with diabetic neuropathy, unspecified: Secondary | ICD-10-CM | POA: Diagnosis not present

## 2019-06-29 DIAGNOSIS — K5909 Other constipation: Secondary | ICD-10-CM | POA: Diagnosis not present

## 2019-06-29 DIAGNOSIS — E11319 Type 2 diabetes mellitus with unspecified diabetic retinopathy without macular edema: Secondary | ICD-10-CM | POA: Diagnosis not present

## 2019-06-29 DIAGNOSIS — R000 Tachycardia, unspecified: Secondary | ICD-10-CM | POA: Diagnosis not present

## 2019-06-29 DIAGNOSIS — K209 Esophagitis, unspecified without bleeding: Secondary | ICD-10-CM | POA: Diagnosis not present

## 2019-06-29 DIAGNOSIS — G44209 Tension-type headache, unspecified, not intractable: Secondary | ICD-10-CM | POA: Diagnosis not present

## 2019-06-29 DIAGNOSIS — H409 Unspecified glaucoma: Secondary | ICD-10-CM | POA: Diagnosis not present

## 2019-06-29 DIAGNOSIS — E1165 Type 2 diabetes mellitus with hyperglycemia: Secondary | ICD-10-CM | POA: Diagnosis not present

## 2019-06-29 DIAGNOSIS — D3500 Benign neoplasm of unspecified adrenal gland: Secondary | ICD-10-CM | POA: Diagnosis not present

## 2019-06-29 DIAGNOSIS — E119 Type 2 diabetes mellitus without complications: Secondary | ICD-10-CM | POA: Diagnosis not present

## 2019-06-29 DIAGNOSIS — E876 Hypokalemia: Secondary | ICD-10-CM | POA: Diagnosis not present

## 2019-06-29 DIAGNOSIS — F331 Major depressive disorder, recurrent, moderate: Secondary | ICD-10-CM | POA: Diagnosis not present

## 2019-06-29 DIAGNOSIS — G4452 New daily persistent headache (NDPH): Secondary | ICD-10-CM | POA: Diagnosis not present

## 2019-06-29 DIAGNOSIS — G43909 Migraine, unspecified, not intractable, without status migrainosus: Secondary | ICD-10-CM | POA: Diagnosis not present

## 2019-06-29 DIAGNOSIS — N179 Acute kidney failure, unspecified: Secondary | ICD-10-CM | POA: Diagnosis not present

## 2019-06-29 DIAGNOSIS — E874 Mixed disorder of acid-base balance: Secondary | ICD-10-CM | POA: Diagnosis not present

## 2019-06-30 DIAGNOSIS — E1069 Type 1 diabetes mellitus with other specified complication: Secondary | ICD-10-CM | POA: Insufficient documentation

## 2019-06-30 DIAGNOSIS — E876 Hypokalemia: Secondary | ICD-10-CM | POA: Insufficient documentation

## 2019-06-30 DIAGNOSIS — E782 Mixed hyperlipidemia: Secondary | ICD-10-CM | POA: Insufficient documentation

## 2019-07-04 DIAGNOSIS — B0052 Herpesviral keratitis: Secondary | ICD-10-CM | POA: Diagnosis not present

## 2019-07-04 MED ORDER — POLYETHYLENE GLYCOL 3350 17 G PO PACK
17.00 | PACK | ORAL | Status: DC
Start: 2019-07-05 — End: 2019-07-04

## 2019-07-04 MED ORDER — MOXIFLOXACIN HCL 0.5 % OP SOLN
1.00 | OPHTHALMIC | Status: DC
Start: 2019-07-04 — End: 2019-07-04

## 2019-07-04 MED ORDER — DULOXETINE HCL 30 MG PO CPEP
30.00 | ORAL_CAPSULE | ORAL | Status: DC
Start: 2019-07-05 — End: 2019-07-04

## 2019-07-04 MED ORDER — ACETAMINOPHEN 325 MG PO TABS
650.00 | ORAL_TABLET | ORAL | Status: DC
Start: ? — End: 2019-07-04

## 2019-07-04 MED ORDER — PANTOPRAZOLE SODIUM 40 MG PO TBEC
40.00 | DELAYED_RELEASE_TABLET | ORAL | Status: DC
Start: 2019-07-05 — End: 2019-07-04

## 2019-07-04 MED ORDER — POLYETHYLENE GLYCOL 3350 17 G PO PACK
17.00 | PACK | ORAL | Status: DC
Start: ? — End: 2019-07-04

## 2019-07-04 MED ORDER — SODIUM BICARBONATE 650 MG PO TABS
650.00 | ORAL_TABLET | ORAL | Status: DC
Start: 2019-07-05 — End: 2019-07-04

## 2019-07-04 MED ORDER — INSULIN LISPRO 100 UNIT/ML ~~LOC~~ SOLN
0.00 | SUBCUTANEOUS | Status: DC
Start: 2019-07-04 — End: 2019-07-04

## 2019-07-04 MED ORDER — DORZOLAMIDE HCL-TIMOLOL MAL 22.3-6.8 MG/ML OP SOLN
1.00 | OPHTHALMIC | Status: DC
Start: 2019-07-04 — End: 2019-07-04

## 2019-07-04 MED ORDER — HYDRALAZINE HCL 50 MG PO TABS
50.00 | ORAL_TABLET | ORAL | Status: DC
Start: 2019-07-05 — End: 2019-07-04

## 2019-07-04 MED ORDER — ONDANSETRON HCL 4 MG/2ML IJ SOLN
4.00 | INTRAMUSCULAR | Status: DC
Start: 2019-07-05 — End: 2019-07-04

## 2019-07-04 MED ORDER — LIDOCAINE HCL (PF) 1 % IJ SOLN
0.50 | INTRAMUSCULAR | Status: DC
Start: ? — End: 2019-07-04

## 2019-07-04 MED ORDER — GENERIC EXTERNAL MEDICATION
10.00 | Status: DC
Start: 2019-07-04 — End: 2019-07-04

## 2019-07-04 MED ORDER — DEXTROSE 50 % IV SOLN
12.50 | INTRAVENOUS | Status: DC
Start: ? — End: 2019-07-04

## 2019-07-04 MED ORDER — CARVEDILOL 12.5 MG PO TABS
12.50 | ORAL_TABLET | ORAL | Status: DC
Start: 2019-07-04 — End: 2019-07-04

## 2019-07-04 MED ORDER — GENERIC EXTERNAL MEDICATION
Status: DC
Start: ? — End: 2019-07-04

## 2019-07-04 MED ORDER — BRIMONIDINE TARTRATE 0.15 % OP SOLN
1.00 | OPHTHALMIC | Status: DC
Start: 2019-07-05 — End: 2019-07-04

## 2019-07-04 MED ORDER — CARVEDILOL 3.125 MG PO TABS
3.13 | ORAL_TABLET | ORAL | Status: DC
Start: 2019-07-04 — End: 2019-07-04

## 2019-07-04 MED ORDER — INSULIN GLARGINE 100 UNIT/ML ~~LOC~~ SOLN
20.00 | SUBCUTANEOUS | Status: DC
Start: 2019-07-04 — End: 2019-07-04

## 2019-07-04 MED ORDER — GLUCAGON HCL RDNA (DIAGNOSTIC) 1 MG IJ SOLR
1.00 | INTRAMUSCULAR | Status: DC
Start: ? — End: 2019-07-04

## 2019-07-04 MED ORDER — LACTATED RINGERS IV SOLN
INTRAVENOUS | Status: DC
Start: ? — End: 2019-07-04

## 2019-07-04 MED ORDER — CARBOXYMETHYLCELLULOSE SOD PF 0.5 % OP SOLN
1.00 | OPHTHALMIC | Status: DC
Start: ? — End: 2019-07-04

## 2019-07-05 ENCOUNTER — Other Ambulatory Visit: Payer: Self-pay

## 2019-07-05 NOTE — Progress Notes (Signed)
Patient called asking questions about new BP medicine- carvedilol 12.5 mg every 12 hours. Opened chart to view new medications and add them to her med list.  Patient said she is taking hr BP at home and its high in between the 12 hours of taking the medication. Wants to know if she should be taking it more often because of this.  Spoke with Dr. Army Melia and called patient and left a detailed msg informing she does not need to continue to take her BP at this time. She needs to continue the same medication as directed and give it several weeks to work in her system to determine if the medication will help. Told her to keep her appt on 11/23 for a follow up with BP and we will go from there.   Told pt she can call back if she has any further questions.  Benedict Needy, CMA

## 2019-07-06 DIAGNOSIS — B0052 Herpesviral keratitis: Secondary | ICD-10-CM | POA: Diagnosis not present

## 2019-07-11 ENCOUNTER — Telehealth: Payer: Self-pay

## 2019-07-11 NOTE — Telephone Encounter (Signed)
She needs to get Miralax - take one capful in 8 oz liquid twice a day until she has a BMs 1-3 times per day then decrease to one dose per day to regulate.

## 2019-07-11 NOTE — Telephone Encounter (Signed)
Advised 

## 2019-07-11 NOTE — Telephone Encounter (Signed)
Please advise Dr. Gaspar Cola response.

## 2019-07-11 NOTE — Telephone Encounter (Signed)
Patient was in hospital weeks ago and took Zofran for awhile due to constant nausea. She believes the Zofran has caused constipation. Last BM 3 weeks ago and was hard and small amount. Denies abdominal pain or NVD. Denies fever. Patient feels stopped up and is concerned she will get sick if no BM. She has tried Enema, prune juice,  Dexilant, Fiber, caster oil , and suppositories. Patient wants relief and hopes for medication to be sent to [facility].

## 2019-07-15 DIAGNOSIS — B0052 Herpesviral keratitis: Secondary | ICD-10-CM | POA: Diagnosis not present

## 2019-07-18 ENCOUNTER — Encounter: Payer: Self-pay | Admitting: Internal Medicine

## 2019-07-18 ENCOUNTER — Other Ambulatory Visit: Payer: Self-pay

## 2019-07-18 ENCOUNTER — Ambulatory Visit (INDEPENDENT_AMBULATORY_CARE_PROVIDER_SITE_OTHER): Payer: BC Managed Care – PPO | Admitting: Internal Medicine

## 2019-07-18 ENCOUNTER — Other Ambulatory Visit
Admission: RE | Admit: 2019-07-18 | Discharge: 2019-07-18 | Disposition: A | Discharge status: Home / Self Care | Payer: BC Managed Care – PPO | Attending: Internal Medicine | Admitting: Internal Medicine

## 2019-07-18 VITALS — BP 104/58 | HR 98 | Ht 67.0 in | Wt 183.0 lb

## 2019-07-18 DIAGNOSIS — E876 Hypokalemia: Secondary | ICD-10-CM

## 2019-07-18 DIAGNOSIS — I10 Essential (primary) hypertension: Secondary | ICD-10-CM | POA: Diagnosis not present

## 2019-07-18 DIAGNOSIS — R809 Proteinuria, unspecified: Secondary | ICD-10-CM

## 2019-07-18 DIAGNOSIS — K5904 Chronic idiopathic constipation: Secondary | ICD-10-CM

## 2019-07-18 LAB — BASIC METABOLIC PANEL
Anion gap: 9 (ref 5–15)
BUN: 47 mg/dL — ABNORMAL HIGH (ref 6–20)
CO2: 25 mmol/L (ref 22–32)
Calcium: 9.2 mg/dL (ref 8.9–10.3)
Chloride: 99 mmol/L (ref 98–111)
Creatinine, Ser: 1.7 mg/dL — ABNORMAL HIGH (ref 0.44–1.00)
GFR calc Af Amer: 38 mL/min — ABNORMAL LOW (ref >60)
GFR calc non Af Amer: 32 mL/min — ABNORMAL LOW (ref >60)
Glucose, Bld: 340 mg/dL — ABNORMAL HIGH (ref 70–99)
Potassium: 4.6 mmol/L (ref 3.5–5.1)
Sodium: 133 mmol/L — ABNORMAL LOW (ref 135–145)

## 2019-07-18 LAB — MAGNESIUM: Magnesium: 2.3 mg/dL (ref 1.7–2.4)

## 2019-07-18 MED ORDER — LOSARTAN POTASSIUM 25 MG PO TABS: 12.5000 mg | ORAL_TABLET | Freq: Every day | ORAL | 1 refills | Status: DC
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
## 2019-07-20 DIAGNOSIS — E1165 Type 2 diabetes mellitus with hyperglycemia: Secondary | ICD-10-CM | POA: Diagnosis not present

## 2019-07-20 DIAGNOSIS — Z1389 Encounter for screening for other disorder: Secondary | ICD-10-CM | POA: Diagnosis not present

## 2019-07-26 ENCOUNTER — Telehealth: Payer: Self-pay

## 2019-07-26 NOTE — Telephone Encounter (Signed)
Received FAX from [place] requesting FMLA forms to be completed for pts husband - Kelli [name]- to care for pt.   Called and left Vm with pt informing her we do not complete FMLA unless she was to have surgery and need to be cared for until she is able to care for herself, or unless she was bed written and needed care in that way.  Told her to call the office if she has anymore questions, but that Dr. Army Melia said she cannot complete these forms for her husband.  Benedict Needy, CMA

## 2019-07-27 DIAGNOSIS — B0232 Zoster iridocyclitis: Secondary | ICD-10-CM | POA: Diagnosis not present

## 2019-07-27 DIAGNOSIS — H3523 Other non-diabetic proliferative retinopathy, bilateral: Secondary | ICD-10-CM | POA: Diagnosis not present

## 2019-07-27 DIAGNOSIS — B0052 Herpesviral keratitis: Secondary | ICD-10-CM | POA: Diagnosis not present

## 2019-08-09 DIAGNOSIS — E113513 Type 2 diabetes mellitus with proliferative diabetic retinopathy with macular edema, bilateral: Secondary | ICD-10-CM | POA: Diagnosis not present

## 2019-08-12 DIAGNOSIS — B0050 Herpesviral ocular disease, unspecified: Secondary | ICD-10-CM | POA: Diagnosis not present

## 2019-08-12 DIAGNOSIS — E113553 Type 2 diabetes mellitus with stable proliferative diabetic retinopathy, bilateral: Secondary | ICD-10-CM | POA: Diagnosis not present

## 2019-08-12 LAB — HM DIABETES EYE EXAM

## 2019-08-15 DIAGNOSIS — E1065 Type 1 diabetes mellitus with hyperglycemia: Secondary | ICD-10-CM | POA: Diagnosis not present

## 2019-08-15 DIAGNOSIS — Z794 Long term (current) use of insulin: Secondary | ICD-10-CM | POA: Diagnosis not present

## 2019-08-15 DIAGNOSIS — M79606 Pain in leg, unspecified: Secondary | ICD-10-CM | POA: Diagnosis not present

## 2019-08-28 ENCOUNTER — Other Ambulatory Visit: Payer: Self-pay | Admitting: Internal Medicine

## 2019-08-30 DIAGNOSIS — E113513 Type 2 diabetes mellitus with proliferative diabetic retinopathy with macular edema, bilateral: Secondary | ICD-10-CM | POA: Diagnosis not present

## 2019-08-30 DIAGNOSIS — H4311 Vitreous hemorrhage, right eye: Secondary | ICD-10-CM | POA: Diagnosis not present

## 2019-08-30 DIAGNOSIS — H4089 Other specified glaucoma: Secondary | ICD-10-CM | POA: Diagnosis not present

## 2019-08-30 DIAGNOSIS — H43811 Vitreous degeneration, right eye: Secondary | ICD-10-CM | POA: Diagnosis not present

## 2019-09-20 DIAGNOSIS — E113513 Type 2 diabetes mellitus with proliferative diabetic retinopathy with macular edema, bilateral: Secondary | ICD-10-CM | POA: Diagnosis not present

## 2019-11-07 ENCOUNTER — Other Ambulatory Visit: Payer: Self-pay

## 2019-11-07 DIAGNOSIS — I872 Venous insufficiency (chronic) (peripheral): Secondary | ICD-10-CM

## 2019-11-07 MED ORDER — MUPIROCIN CALCIUM 2 % EX CREA: 1.0000 "application " | TOPICAL_CREAM | Freq: Two times a day (BID) | CUTANEOUS | 0 refills | Status: DC

## 2019-11-07 NOTE — Progress Notes (Signed)
Pt called requesting RF on her cream for her leg PRN.  Sent in RF.  CM

## 2019-11-09 ENCOUNTER — Ambulatory Visit: Payer: BC Managed Care – PPO | Admitting: Internal Medicine

## 2019-11-10 ENCOUNTER — Other Ambulatory Visit: Payer: Self-pay

## 2019-11-10 ENCOUNTER — Encounter: Payer: Self-pay | Admitting: Internal Medicine

## 2019-11-10 ENCOUNTER — Ambulatory Visit (INDEPENDENT_AMBULATORY_CARE_PROVIDER_SITE_OTHER): Payer: BC Managed Care – PPO | Admitting: Internal Medicine

## 2019-11-10 VITALS — BP 140/66 | HR 98 | Temp 97.5°F | Ht 67.0 in | Wt 192.0 lb

## 2019-11-10 DIAGNOSIS — Z1231 Encounter for screening mammogram for malignant neoplasm of breast: Secondary | ICD-10-CM

## 2019-11-10 DIAGNOSIS — I10 Essential (primary) hypertension: Secondary | ICD-10-CM

## 2019-11-10 DIAGNOSIS — L03031 Cellulitis of right toe: Secondary | ICD-10-CM

## 2019-11-10 DIAGNOSIS — E1042 Type 1 diabetes mellitus with diabetic polyneuropathy: Secondary | ICD-10-CM

## 2019-11-10 DIAGNOSIS — E11610 Type 2 diabetes mellitus with diabetic neuropathic arthropathy: Secondary | ICD-10-CM | POA: Diagnosis not present

## 2019-11-10 MED ORDER — DOXYCYCLINE HYCLATE 100 MG PO TABS: 100.0000 mg | ORAL_TABLET | Freq: Two times a day (BID) | ORAL | 0 refills | Status: AC

## 2019-11-10 NOTE — Progress Notes (Signed)
Date:  11/10/2019   Name:  Kelli [name]   DOB:  Sep 13, 1960   MRN:  [id number]   Chief Complaint: Toe Pain (R) great toe injury. Stomped toe on metal bar stool in the kitchen X 1 week ago. Skin is peeling off of it and wants be sure its not infected.)  Toe Pain  The incident occurred 5 to 7 days ago. The incident occurred at home. The injury mechanism was a direct blow. The pain is present in the right toes. The quality of the pain is described as shooting. The pain is at a severity of 2/10. The pain is mild. The pain has been fluctuating since onset. Associated symptoms comments: Has decreased sensation due to Charcot foot. She has tried nothing for the symptoms. The treatment provided no relief.  Hypertension This is a chronic problem. The problem is controlled. Pertinent negatives include no chest pain, headaches, palpitations or shortness of breath. Past treatments include beta blockers and angiotensin blockers. The current treatment provides significant improvement.  Diabetes She has type 1 diabetes mellitus. Disease course: Pt [facility] in order to get insulin; does not want A1C today; also goes to Endocrinology who has only done virtual visits all year and no A1C. Pertinent negatives for hypoglycemia include no dizziness, headaches or nervousness/anxiousness. Associated symptoms include foot paresthesias and visual change. Pertinent negatives for diabetes include no chest pain and no fatigue. Symptoms are stable. Current diabetic treatment includes intensive insulin program. She is compliant with treatment most of the time. An ACE inhibitor/angiotensin II receptor blocker is being taken.    Lab Results  Component Value Date   CREATININE 1.70 (H) 07/18/2019   BUN 47 (H) 07/18/2019   NA 133 (L) 07/18/2019   K 4.6 07/18/2019   CL 99 07/18/2019   CO2 25 07/18/2019   Lab Results  Component Value Date   CHOL 244 (H) 09/02/2018   HDL 70 09/02/2018   LDLCALC 139 (H)  09/02/2018   TRIG 175 (H) 09/02/2018   CHOLHDL 3.5 09/02/2018   Lab Results  Component Value Date   TSH 1.794 09/02/2018   Lab Results  Component Value Date   HGBA1C 8.7 (H) 04/15/2019   Lab Results  Component Value Date   WBC 7.0 09/02/2018   HGB 11.7 (L) 09/02/2018   HCT 36.3 09/02/2018   MCV 87.1 09/02/2018   PLT 347 09/02/2018   Lab Results  Component Value Date   ALT 16 04/15/2019   AST 22 04/15/2019   ALKPHOS 83 04/15/2019   BILITOT 0.4 04/15/2019     Review of Systems  Constitutional: Negative for chills, fatigue and fever.  Respiratory: Negative for chest tightness, shortness of breath and wheezing.   Cardiovascular: Negative for chest pain and palpitations.  Musculoskeletal: Positive for arthralgias, gait problem and joint swelling.  Skin: Positive for color change and wound.  Neurological: Negative for dizziness, light-headedness and headaches.  Psychiatric/Behavioral: Negative for sleep disturbance. The patient is not nervous/anxious.     Patient Active Problem List   Diagnosis Date Noted  . Hypokalemia 06/30/2019  . Hypomagnesemia 06/30/2019  . Microalbuminuria 09/24/2018  . Neuropathy of left foot 03/18/2017  . 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 Charcot foot due to diabetes mellitus ([place]) 02/09/2017  . Venous insufficiency of both lower extremities 02/09/2017  . Chronic idiopathic constipation 01/26/2017  . Intractable vomiting with nausea 01/26/2017  . Abnormal thyroid blood test 09/30/2016  . Essential hypertension 08/21/2016  . Adrenal  mass ([place]) 08/18/2016  . Temporomandibular joint pain 11/02/2014  . Moderate episode of recurrent major depressive disorder ([place]) 11/02/2014  . 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[place]) 06/28/2014  . Eustachian tube dysfunction 05/10/2014  . Hyperlipidemia 10/03/2013  . 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 Vitamin D deficiency 12/30/2012  . Corns and callosity 07/09/2012  .  Hammer toe, acquired 07/09/2012    Allergies  Allergen Reactions  . Gabapentin Swelling  . Hydrochlorothiazide Nausea And Vomiting  . Lisinopril Other (See Comments)    fatigue  . Lyrica [Pregabalin] Swelling  . Metformin And Related Nausea And Vomiting  . Penicillins Nausea And Vomiting    Past Surgical History:  Procedure Laterality Date  . ABDOMINAL HYSTERECTOMY    . COLONOSCOPY W/ POLYPECTOMY    . ESOPHAGOGASTRODUODENOSCOPY  03/02/2017   Normal  . HEMORRHOID SURGERY      Social History   Tobacco Use  . Smoking status: Former Research scientist (life sciences)  . Smokeless tobacco: Never Used  Substance Use Topics  . Alcohol use: No  . Drug use: No     Medication list has been reviewed and updated.  Current Meds  Medication Sig  . atropine 1 % ophthalmic solution Place 1 drop into the left eye 2 (two) times daily.  . brimonidine (ALPHAGAN) 0.2 % ophthalmic solution INSTILL 1 DROP INTO RIGHT EYE TWICE A DAY  . carvedilol (COREG) 12.5 MG tablet TAKE 1 TABLET (12.5 MG TOTAL) BY MOUTH EVERY 12 (TWELVE) HOURS  . Cholecalciferol (VITAMIN D3) 1000 units CAPS Take 1,000 Units by mouth daily.  . Continuous Blood Gluc Sensor (FREESTYLE LIBRE 14 DAY SENSOR) MISC USE AS DIRECTED EVERY 14 DAYS  . Continuous Blood Gluc Sensor ([place]) MISC by Other route every fourteen (14) days.  . dorzolamide-timolol (COSOPT) 22.3-6.8 MG/ML ophthalmic solution INSTILL 1 DROP INTO LEFT EYE TWICE A DAY  . DULoxetine (CYMBALTA) 60 MG capsule Take 60 mg by mouth daily.  Marland Kitchen erythromycin ophthalmic ointment APPLY TO LEFT EYE AT BEDTIME AS DIRECTED FOR 1 WEEK  . Insulin Degludec (TRESIBA) 100 UNIT/ML SOLN Inject 20 Units into the skin daily.  . insulin lispro (HUMALOG) 100 UNIT/ML KiwkPen 15 units with meal - on sliding scale.  . Latanoprost 0.005 % EMUL PLACE 1 DROP IN BOTH EYES EVERY NIGHT AT BEDTIME  . losartan (COZAAR) 25 MG tablet Take 0.5 tablets (12.5 mg total) by mouth daily.  Marland Kitchen moxifloxacin  (VIGAMOX) 0.5 % ophthalmic solution Apply 1 drop to eye 4 (four) times daily.  . mupirocin cream (BACTROBAN) 2 % Apply 1 application topically 2 (two) times daily.    PHQ 2/9 Scores 11/10/2019 07/18/2019 04/15/2019 09/02/2018  PHQ - 2 Score 2 4 2 4   PHQ- 9 Score 6 12 15 8     BP Readings from Last 3 Encounters:  11/10/19 140/66  07/18/19 (!) 104/58  06/13/19 (!) 147/80    Physical Exam Vitals and nursing note reviewed.  Constitutional:      General: She is not in acute distress.    Appearance: She is well-developed.  HENT:     Head: Normocephalic and atraumatic.  Cardiovascular:     Rate and Rhythm: Normal rate and regular rhythm.     Pulses:          Dorsalis pedis pulses are 1+ on the right side and 1+ on the left side.  Pulmonary:     Effort: Pulmonary effort is normal. No respiratory distress.     Breath sounds:  No wheezing or rhonchi.  Musculoskeletal:        General: Swelling present.     Right lower leg: No edema.     Left lower leg: No edema.     Right foot: Charcot foot present.     Left foot: Charcot foot present.  Feet:     Right foot:     Skin integrity: Erythema, callus and dry skin present.     Toenail Condition: Right toenails are abnormally thick.     Left foot:     Skin integrity: Callus and dry skin present. No erythema.     Toenail Condition: Left toenails are abnormally thick.     Comments: Redness around base of right great toe nail, swelling Skin:    General: Skin is warm and dry.     Findings: No rash.  Neurological:     Mental Status: She is alert and oriented to person, place, and time.  Psychiatric:        Behavior: Behavior normal.        Thought Content: Thought content normal.     Wt Readings from Last 3 Encounters:  11/10/19 192 lb (87.1 kg)  07/18/19 183 lb (83 kg)  06/13/19 170 lb (77.1 kg)    BP 140/66   Pulse 98   Temp (!) 97.5 F (36.4 C) (Temporal)   Ht 5\' 7"  (1.702 m)   Wt 192 lb (87.1 kg)   SpO2 98%   BMI 30.07 kg/m     Assessment and Plan: 1. Cellulitis of toe of right foot Continue local care TDap is up to date - doxycycline (VIBRA-TABS) 100 MG tablet; Take 1 tablet (100 mg total) by mouth 2 (two) times daily for 10 days.  Dispense: 20 tablet; Refill: 0  2. Essential hypertension Clinically stable exam with well controlled BP on losartan and coreg. Tolerating medications without side effects at this time. Pt to continue current regimen and low sodium diet; benefits of regular exercise as able discussed.  3. 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HCC) On multiple daily insulin injections Followed by Endocrinology but no A1C in the past 6 months  4. Charcot foot due to diabetes mellitus ([place]) Local foot care discussed  5. Encounter for screening mammogram for breast cancer Due for mammogram - will order and ask referrals to schedule - MM 3D SCREEN BREAST BILATERAL; Future   Partially dictated using Editor, commissioning. Any errors are unintentional.  Halina Maidens, MD [facility]  11/10/2019

## 2019-11-29 ENCOUNTER — Encounter: Payer: Self-pay | Admitting: Internal Medicine

## 2019-12-01 ENCOUNTER — Telehealth: Payer: Self-pay

## 2019-12-01 NOTE — Telephone Encounter (Signed)
-----   Message from Glean Hess, MD sent at 11/29/2019  4:46 PM EDT ----- Call pt and remind her to schedule her Mammogram.

## 2019-12-01 NOTE — Telephone Encounter (Signed)
Called pt and left a VM reminding her to schedule her mammogram appt.  CM

## 2019-12-04 DIAGNOSIS — R112 Nausea with vomiting, unspecified: Secondary | ICD-10-CM | POA: Diagnosis not present

## 2019-12-04 DIAGNOSIS — R05 Cough: Secondary | ICD-10-CM | POA: Diagnosis not present

## 2019-12-04 DIAGNOSIS — Z880 Allergy status to penicillin: Secondary | ICD-10-CM | POA: Diagnosis not present

## 2019-12-04 DIAGNOSIS — D72829 Elevated white blood cell count, unspecified: Secondary | ICD-10-CM | POA: Diagnosis not present

## 2019-12-04 DIAGNOSIS — R000 Tachycardia, unspecified: Secondary | ICD-10-CM | POA: Diagnosis not present

## 2019-12-04 DIAGNOSIS — I10 Essential (primary) hypertension: Secondary | ICD-10-CM | POA: Diagnosis not present

## 2019-12-04 DIAGNOSIS — E7800 Pure hypercholesterolemia, unspecified: Secondary | ICD-10-CM | POA: Diagnosis not present

## 2019-12-04 DIAGNOSIS — Z87891 Personal history of nicotine dependence: Secondary | ICD-10-CM | POA: Diagnosis not present

## 2019-12-04 DIAGNOSIS — K5900 Constipation, unspecified: Secondary | ICD-10-CM | POA: Diagnosis not present

## 2019-12-04 DIAGNOSIS — R140 Abdominal distension (gaseous): Secondary | ICD-10-CM | POA: Diagnosis not present

## 2019-12-04 DIAGNOSIS — E1140 Type 2 diabetes mellitus with diabetic neuropathy, unspecified: Secondary | ICD-10-CM | POA: Diagnosis not present

## 2019-12-05 DIAGNOSIS — R7989 Other specified abnormal findings of blood chemistry: Secondary | ICD-10-CM | POA: Diagnosis not present

## 2019-12-05 DIAGNOSIS — R112 Nausea with vomiting, unspecified: Secondary | ICD-10-CM | POA: Diagnosis not present

## 2019-12-05 DIAGNOSIS — N178 Other acute kidney failure: Secondary | ICD-10-CM | POA: Diagnosis not present

## 2019-12-05 DIAGNOSIS — Z20822 Contact with and (suspected) exposure to covid-19: Secondary | ICD-10-CM | POA: Diagnosis not present

## 2019-12-05 DIAGNOSIS — Z87891 Personal history of nicotine dependence: Secondary | ICD-10-CM | POA: Diagnosis not present

## 2019-12-05 DIAGNOSIS — R140 Abdominal distension (gaseous): Secondary | ICD-10-CM | POA: Diagnosis not present

## 2019-12-05 DIAGNOSIS — E278 Other specified disorders of adrenal gland: Secondary | ICD-10-CM | POA: Diagnosis not present

## 2019-12-05 DIAGNOSIS — E279 Disorder of adrenal gland, unspecified: Secondary | ICD-10-CM | POA: Diagnosis not present

## 2019-12-05 DIAGNOSIS — E10610 Type 1 diabetes mellitus with diabetic neuropathic arthropathy: Secondary | ICD-10-CM | POA: Diagnosis not present

## 2019-12-05 DIAGNOSIS — E1042 Type 1 diabetes mellitus with diabetic polyneuropathy: Secondary | ICD-10-CM | POA: Diagnosis not present

## 2019-12-05 DIAGNOSIS — E871 Hypo-osmolality and hyponatremia: Secondary | ICD-10-CM | POA: Diagnosis not present

## 2019-12-05 DIAGNOSIS — R3989 Other symptoms and signs involving the genitourinary system: Secondary | ICD-10-CM | POA: Diagnosis not present

## 2019-12-05 DIAGNOSIS — E860 Dehydration: Secondary | ICD-10-CM | POA: Diagnosis not present

## 2019-12-05 DIAGNOSIS — N179 Acute kidney failure, unspecified: Secondary | ICD-10-CM | POA: Diagnosis not present

## 2019-12-05 DIAGNOSIS — R630 Anorexia: Secondary | ICD-10-CM | POA: Diagnosis not present

## 2019-12-05 DIAGNOSIS — Z5181 Encounter for therapeutic drug level monitoring: Secondary | ICD-10-CM | POA: Diagnosis not present

## 2019-12-05 DIAGNOSIS — K5900 Constipation, unspecified: Secondary | ICD-10-CM | POA: Diagnosis not present

## 2019-12-06 DIAGNOSIS — R112 Nausea with vomiting, unspecified: Secondary | ICD-10-CM | POA: Diagnosis not present

## 2019-12-06 DIAGNOSIS — R531 Weakness: Secondary | ICD-10-CM | POA: Diagnosis not present

## 2019-12-06 DIAGNOSIS — R5383 Other fatigue: Secondary | ICD-10-CM | POA: Diagnosis not present

## 2019-12-06 DIAGNOSIS — R414 Neurologic neglect syndrome: Secondary | ICD-10-CM | POA: Diagnosis not present

## 2019-12-07 DIAGNOSIS — I10 Essential (primary) hypertension: Secondary | ICD-10-CM | POA: Diagnosis not present

## 2019-12-07 DIAGNOSIS — R448 Other symptoms and signs involving general sensations and perceptions: Secondary | ICD-10-CM | POA: Diagnosis not present

## 2019-12-07 DIAGNOSIS — R29810 Facial weakness: Secondary | ICD-10-CM | POA: Diagnosis not present

## 2019-12-07 DIAGNOSIS — E785 Hyperlipidemia, unspecified: Secondary | ICD-10-CM | POA: Diagnosis not present

## 2019-12-07 DIAGNOSIS — R531 Weakness: Secondary | ICD-10-CM | POA: Diagnosis not present

## 2019-12-07 DIAGNOSIS — Z794 Long term (current) use of insulin: Secondary | ICD-10-CM | POA: Diagnosis not present

## 2019-12-07 DIAGNOSIS — E11319 Type 2 diabetes mellitus with unspecified diabetic retinopathy without macular edema: Secondary | ICD-10-CM | POA: Diagnosis not present

## 2019-12-07 DIAGNOSIS — R112 Nausea with vomiting, unspecified: Secondary | ICD-10-CM | POA: Diagnosis not present

## 2019-12-07 DIAGNOSIS — R1032 Left lower quadrant pain: Secondary | ICD-10-CM | POA: Diagnosis not present

## 2019-12-07 DIAGNOSIS — Z79899 Other long term (current) drug therapy: Secondary | ICD-10-CM | POA: Diagnosis not present

## 2019-12-07 DIAGNOSIS — G459 Transient cerebral ischemic attack, unspecified: Secondary | ICD-10-CM | POA: Diagnosis not present

## 2019-12-07 MED ORDER — INSULIN LISPRO 100 UNIT/ML ~~LOC~~ SOLN
0.08 | SUBCUTANEOUS | Status: DC
Start: 2019-12-07 — End: 2019-12-07

## 2019-12-07 MED ORDER — INSULIN LISPRO 100 UNIT/ML ~~LOC~~ SOLN
0.08 | SUBCUTANEOUS | Status: DC
Start: ? — End: 2019-12-07

## 2019-12-07 MED ORDER — DEXTROSE 50 % IV SOLN
12.50 | INTRAVENOUS | Status: DC
Start: ? — End: 2019-12-07

## 2019-12-07 MED ORDER — INSULIN GLARGINE 100 UNIT/ML ~~LOC~~ SOLN
0.25 | SUBCUTANEOUS | Status: DC
Start: ? — End: 2019-12-07

## 2019-12-07 MED ORDER — INSULIN LISPRO 100 UNIT/ML ~~LOC~~ SOLN
0.08 | SUBCUTANEOUS | Status: DC
Start: 2019-12-08 — End: 2019-12-07

## 2019-12-07 MED ORDER — GLUCAGON (RDNA) 1 MG IJ KIT
1.00 | PACK | INTRAMUSCULAR | Status: DC
Start: ? — End: 2019-12-07

## 2019-12-08 ENCOUNTER — Other Ambulatory Visit
Admission: RE | Admit: 2019-12-08 | Discharge: 2019-12-08 | Disposition: A | Discharge status: Home / Self Care | Payer: BC Managed Care – PPO | Attending: Internal Medicine | Admitting: Internal Medicine

## 2019-12-08 ENCOUNTER — Other Ambulatory Visit: Payer: Self-pay

## 2019-12-08 ENCOUNTER — Ambulatory Visit (INDEPENDENT_AMBULATORY_CARE_PROVIDER_SITE_OTHER): Payer: BC Managed Care – PPO | Admitting: Internal Medicine

## 2019-12-08 ENCOUNTER — Encounter: Payer: Self-pay | Admitting: Internal Medicine

## 2019-12-08 VITALS — BP 124/78 | HR 85 | Temp 97.5°F | Ht 67.0 in | Wt 181.0 lb

## 2019-12-08 DIAGNOSIS — F331 Major depressive disorder, recurrent, moderate: Secondary | ICD-10-CM

## 2019-12-08 DIAGNOSIS — R809 Proteinuria, unspecified: Secondary | ICD-10-CM

## 2019-12-08 DIAGNOSIS — E782 Mixed hyperlipidemia: Secondary | ICD-10-CM | POA: Diagnosis not present

## 2019-12-08 DIAGNOSIS — E1042 Type 1 diabetes mellitus with diabetic polyneuropathy: Secondary | ICD-10-CM

## 2019-12-08 DIAGNOSIS — R112 Nausea with vomiting, unspecified: Secondary | ICD-10-CM

## 2019-12-08 DIAGNOSIS — I10 Essential (primary) hypertension: Secondary | ICD-10-CM | POA: Diagnosis not present

## 2019-12-08 DIAGNOSIS — E11610 Type 2 diabetes mellitus with diabetic neuropathic arthropathy: Secondary | ICD-10-CM | POA: Diagnosis not present

## 2019-12-08 DIAGNOSIS — E278 Other specified disorders of adrenal gland: Secondary | ICD-10-CM

## 2019-12-08 LAB — LIPID PANEL
Cholesterol: 226 mg/dL — ABNORMAL HIGH (ref 0–200)
HDL: 45 mg/dL (ref >40)
LDL Cholesterol: 132 mg/dL — ABNORMAL HIGH (ref 0–99)
Total CHOL/HDL Ratio: 5 RATIO
Triglycerides: 245 mg/dL — ABNORMAL HIGH (ref <150)
VLDL: 49 mg/dL — ABNORMAL HIGH (ref 0–40)

## 2019-12-08 LAB — HEMOGLOBIN A1C
Hgb A1c MFr Bld: 9.2 % — ABNORMAL HIGH (ref 4.8–5.6)
Mean Plasma Glucose: 217.34 mg/dL

## 2019-12-08 MED ORDER — LOSARTAN POTASSIUM 25 MG PO TABS: 12.5000 mg | ORAL_TABLET | ORAL | 1 refills | Status: DC

## 2019-12-08 MED ORDER — ATORVASTATIN CALCIUM 10 MG PO TABS: 10.0000 mg | ORAL_TABLET | ORAL | 3 refills | Status: DC

## 2019-12-08 MED ORDER — METOCLOPRAMIDE HCL 10 MG PO TABS: 10.0000 mg | ORAL_TABLET | Freq: Two times a day (BID) | ORAL | 0 refills | Status: DC

## 2019-12-08 NOTE — Patient Instructions (Addendum)
Get a pill organizer with at least 2 dosage times per day to help keep up with your medications.  Continue Carvedilol twice a day  Resume Losartan 50 mg - 1/2 tablet daily in the morning  Start Reglan 10 mg twice a day before meals  Start Atorvastatin 10 mg once a day in the morning  Take Cymbalta 30 mg EVERY other day in the morning for 2 weeks then STOP

## 2019-12-08 NOTE — Progress Notes (Signed)
Date:  12/08/2019   Name:  Kelli [name]   DOB:  [birth year]   MRN:  [id number]   Chief Complaint: Depression (Feels cymbalta causes her to be constipated and then causes emesis.), Hypertension (Feels carvedilol is not enough for her BP. She says the BP is still high at home. ), and Diabetes Evaluated in ER at [facility] yesterday.  Summary of Neuro consult below. # L-sided facial "tightness" # L-sided distal hand stiffness, numbness Patient's neurologic symptoms were transient and do not localize to any specific territory (especially given weakness only in L distal fingertips). Possible APD r/t known hx of diabetic retinopathy; superior quadrantopias seen on prior ophthalmologic exams. As such, low concern for TIA at the moment and does not need to be admitted for obs under the TIA protocol. However, she does have multiple risk factors for ischemia especially given elevated SBPs on admission to 190s, elevated A1c, and HLD. Possible that transient nonspecific neurologic sx occurred in setting of elevated BPs on admission. Would benefit greatly from re-eval with PCP for medication optimization for HTN and diabetes; she has an appointment at [facility] tomorrow 4/15 for this already scheduled. - no need for further imaging with MRI - f/u with PCP to recheck TSH, lipids; BP and diabetes medication optimization per PCP The facial numbness and numbness on the dorsum of her left hand has resolved.  It was terrifying for her and she wants to do whatever she needs to avoid future problems.  She has taken cholesterol meds in the past and does not think she had issues. Hypertension This is a chronic problem. The problem is controlled. Pertinent negatives include no chest pain, headaches, palpitations or shortness of breath. Past treatments include angiotensin blockers and beta blockers. The current treatment provides significant improvement. There are no compliance problems.   Diabetes She presents for her follow-up  diabetic visit. She has type 1 diabetes mellitus. Pertinent negatives for hypoglycemia include no dizziness or headaches. Associated symptoms include fatigue. Pertinent negatives for diabetes include no chest pain. Current diabetic treatment includes insulin injections.  She is on Cymbalta for peripheral neuropathy and depression.  However, she feels certain it causes constipation with resultant nausea and vomiting.  She has not had any in several days and does not have an sx to suggest withdrawal.  Lab Results  Component Value Date   CREATININE 1.70 (H) 07/18/2019   BUN 47 (H) 07/18/2019   NA 133 (L) 07/18/2019   K 4.6 07/18/2019   CL 99 07/18/2019   CO2 25 07/18/2019   Lab Results  Component Value Date   CHOL 244 (H) 09/02/2018   HDL 70 09/02/2018   LDLCALC 139 (H) 09/02/2018   TRIG 175 (H) 09/02/2018   CHOLHDL 3.5 09/02/2018   Lab Results  Component Value Date   TSH 1.794 09/02/2018   Lab Results  Component Value Date   HGBA1C 8.7 (H) 04/15/2019   Lab Results  Component Value Date   WBC 7.0 09/02/2018   HGB 11.7 (L) 09/02/2018   HCT 36.3 09/02/2018   MCV 87.1 09/02/2018   PLT 347 09/02/2018   Lab Results  Component Value Date   ALT 16 04/15/2019   AST 22 04/15/2019   ALKPHOS 83 04/15/2019   BILITOT 0.4 04/15/2019     Review of Systems  Constitutional: Positive for fatigue. Negative for chills and fever.  Respiratory: Negative for chest tightness and shortness of breath.   Cardiovascular: Negative for chest pain, palpitations and leg swelling.  Gastrointestinal: Positive for constipation and nausea. Negative for abdominal pain, diarrhea and vomiting.  Neurological: Negative for dizziness, light-headedness and headaches.  Psychiatric/Behavioral: Positive for dysphoric mood and sleep disturbance. Negative for suicidal ideas.    Patient Active Problem List   Diagnosis Date Noted  . Hypokalemia 06/30/2019  . Hypomagnesemia 06/30/2019  . Microalbuminuria  09/24/2018  . Neuropathy of left foot 03/18/2017  . 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 Charcot foot due to diabetes mellitus ([place]) 02/09/2017  . Venous insufficiency of both lower extremities 02/09/2017  . Chronic idiopathic constipation 01/26/2017  . Intractable vomiting with nausea 01/26/2017  . Abnormal thyroid blood test 09/30/2016  . Essential hypertension 08/21/2016  . Adrenal mass ([place]) 08/18/2016  . Temporomandibular joint pain 11/02/2014  . Moderate episode of recurrent major depressive disorder ([place]) 11/02/2014  . 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[place]) 06/28/2014  . Eustachian tube dysfunction 05/10/2014  . Hyperlipidemia 10/03/2013  . 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 Vitamin D deficiency 12/30/2012  . Corns and callosity 07/09/2012  . Hammer toe, acquired 07/09/2012    Allergies  Allergen Reactions  . Gabapentin Swelling  . Hydrochlorothiazide Nausea And Vomiting  . Lisinopril Other (See Comments)    fatigue  . Lyrica [Pregabalin] Swelling  . Metformin And Related Nausea And Vomiting  . Penicillins Nausea And Vomiting    Past Surgical History:  Procedure Laterality Date  . ABDOMINAL HYSTERECTOMY    . COLONOSCOPY W/ POLYPECTOMY    . ESOPHAGOGASTRODUODENOSCOPY  03/02/2017   Normal  . HEMORRHOID SURGERY      Social History   Tobacco Use  . Smoking status: Former Research scientist (life sciences)  . Smokeless tobacco: Never Used  Substance Use Topics  . Alcohol use: No  . Drug use: No     Medication list has been reviewed and updated.  Current Meds  Medication Sig  . atropine 1 % ophthalmic solution Place 1 drop into the left eye 2 (two) times daily.  . brimonidine (ALPHAGAN) 0.2 % ophthalmic solution INSTILL 1 DROP INTO RIGHT EYE TWICE A DAY  . Cholecalciferol (VITAMIN D3) 1000 units CAPS Take 1,000 Units by mouth daily.  . Continuous Blood Gluc Sensor (FREESTYLE LIBRE 14 DAY SENSOR) MISC USE AS DIRECTED EVERY 14 DAYS  . Continuous  Blood Gluc Sensor ([place]) MISC by Other route every fourteen (14) days.  . dorzolamide-timolol (COSOPT) 22.3-6.8 MG/ML ophthalmic solution INSTILL 1 DROP INTO LEFT EYE TWICE A DAY  . DULoxetine (CYMBALTA) 60 MG capsule Take 60 mg by mouth daily.  Marland Kitchen erythromycin ophthalmic ointment APPLY TO LEFT EYE AT BEDTIME AS DIRECTED FOR 1 WEEK  . Insulin Degludec (TRESIBA) 100 UNIT/ML SOLN Inject 20 Units into the skin daily.  . insulin lispro (HUMALOG) 100 UNIT/ML KiwkPen 15 units with meal - on sliding scale.  . Latanoprost 0.005 % EMUL PLACE 1 DROP IN BOTH EYES EVERY NIGHT AT BEDTIME  . metoCLOPramide (REGLAN) 10 MG tablet Take 10 mg by mouth every 6 (six) hours as needed for nausea.  Marland Kitchen moxifloxacin (VIGAMOX) 0.5 % ophthalmic solution Apply 1 drop to eye 4 (four) times daily.  . mupirocin cream (BACTROBAN) 2 % Apply 1 application topically 2 (two) times daily.    PHQ 2/9 Scores 12/08/2019 11/10/2019 07/18/2019 04/15/2019  PHQ - 2 Score 6 2 4 2   PHQ- 9 Score 15 6 12 15     BP Readings from Last 3 Encounters:  12/08/19 124/78  11/10/19 140/66  07/18/19 (!) 104/58  Physical Exam Vitals and nursing note reviewed.  Constitutional:      General: She is not in acute distress.    Appearance: Normal appearance. She is well-developed.  HENT:     Head: Normocephalic and atraumatic.  Cardiovascular:     Rate and Rhythm: Normal rate and regular rhythm.     Pulses: Normal pulses.  Pulmonary:     Effort: Pulmonary effort is normal. No respiratory distress.     Breath sounds: No wheezing or rhonchi.  Abdominal:     General: Abdomen is flat.     Palpations: Abdomen is soft.     Tenderness: There is no abdominal tenderness.  Musculoskeletal:     Cervical back: Normal range of motion.     Right lower leg: No edema.     Left lower leg: No edema.  Lymphadenopathy:     Cervical: No cervical adenopathy.  Skin:    General: Skin is warm and dry.     Findings: No rash.    Neurological:     General: No focal deficit present.     Mental Status: She is alert and oriented to person, place, and time.     Cranial Nerves: Cranial nerves are intact.     Motor: Motor function is intact.     Coordination: Coordination is intact.     Gait: Gait is intact.  Psychiatric:        Attention and Perception: Attention normal.        Mood and Affect: Mood is depressed.        Speech: Speech normal.        Behavior: Behavior normal.        Thought Content: Thought content normal.     Wt Readings from Last 3 Encounters:  12/08/19 181 lb (82.1 kg)  11/10/19 192 lb (87.1 kg)  07/18/19 183 lb (83 kg)    BP 124/78   Pulse 85   Temp (!) 97.5 F (36.4 C) (Temporal)   Ht 5\' 7"  (1.702 m)   Wt 181 lb (82.1 kg)   SpO2 97%   BMI 28.35 kg/m   Assessment and Plan: 1. Essential hypertension Clinically stable exam with well controlled BP on Coreg.She needs to resume losartan 25 mg. Tolerating medications without side effects at this time. Pt to continue current regimen and low sodium diet; benefits of regular exercise as able discussed. - losartan (COZAAR) 25 MG tablet; Take 0.5 tablets (12.5 mg total) by mouth every morning.  Dispense: 45 tablet; Refill: 1  2. Mixed hyperlipidemia Given recent neurological issues and DM, I recommend starting a statin and patient agrees. - atorvastatin (LIPITOR) 10 MG tablet; Take 1 tablet (10 mg total) by mouth every morning.  Dispense: 90 tablet; Refill: 3 - Lipid panel  3. 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[place]) Followed by endocrinology - Hemoglobin A1c  4. Charcot foot due to diabetes mellitus ([place])  5. Moderate episode of recurrent major depressive disorder (HCC) Intolerant of cymbalta per patient Recommend Cymbalta 30 mg every other day for 2 weeks then stop  6. Adrenal mass ([place]) Most recent CT shows stable mass appears benign No further follow up is needed  7. Microalbuminuria Resume ARB - losartan  (COZAAR) 25 MG tablet; Take 0.5 tablets (12.5 mg total) by mouth every morning.  Dispense: 45 tablet; Refill: 1  8. Intractable vomiting with nausea, unspecified vomiting type Take reglan bid for several weeks then stop if sx are resolved. - metoCLOPramide (REGLAN) 10 MG  tablet; Take 1 tablet (10 mg total) by mouth 2 (two) times daily before a meal.  Dispense: 60 tablet; Refill: 0   Partially dictated using Editor, commissioning. Any errors are unintentional.  Halina Maidens, MD [facility]  12/08/2019

## 2019-12-13 DIAGNOSIS — E113511 Type 2 diabetes mellitus with proliferative diabetic retinopathy with macular edema, right eye: Secondary | ICD-10-CM | POA: Diagnosis not present

## 2019-12-13 DIAGNOSIS — E113513 Type 2 diabetes mellitus with proliferative diabetic retinopathy with macular edema, bilateral: Secondary | ICD-10-CM | POA: Diagnosis not present

## 2019-12-15 ENCOUNTER — Other Ambulatory Visit: Payer: Self-pay | Admitting: Internal Medicine

## 2019-12-15 ENCOUNTER — Telehealth: Payer: Self-pay | Admitting: Internal Medicine

## 2019-12-15 ENCOUNTER — Other Ambulatory Visit: Payer: Self-pay

## 2019-12-15 MED ORDER — OMEPRAZOLE 20 MG PO CPDR: 20.0000 mg | DELAYED_RELEASE_CAPSULE | Freq: Every day | ORAL | 0 refills | Status: DC

## 2019-12-15 NOTE — Telephone Encounter (Unsigned)
Copied from [place] [id number]. Topic: General - Other >> Dec 15, 2019 10:28 AM Antonieta Iba C wrote: Reason for CRM: pt called in to speak with CMA about medication. Pt says that she's not sure of the medication but the CMA would like know.

## 2019-12-15 NOTE — Telephone Encounter (Signed)
Pharmacy is requesting additional information from PCP for Rx- sent for review

## 2019-12-15 NOTE — Telephone Encounter (Signed)
Spoke with patient. She is having sizzling and gurgling sounds" in her throat at night when lays down. Sent in omeprazole per Dr Army Melia. Told patient to take for 30 days and call and let us know if its better. If it is better, we will keep the patient on the medication to reduce acid. CM

## 2019-12-16 ENCOUNTER — Other Ambulatory Visit: Payer: Self-pay

## 2019-12-16 MED ORDER — LANSOPRAZOLE 15 MG PO CPDR: 15.0000 mg | DELAYED_RELEASE_CAPSULE | Freq: Every day | ORAL | 0 refills | Status: DC

## 2019-12-17 ENCOUNTER — Other Ambulatory Visit: Payer: Self-pay | Admitting: Internal Medicine

## 2019-12-17 DIAGNOSIS — F331 Major depressive disorder, recurrent, moderate: Secondary | ICD-10-CM

## 2019-12-17 NOTE — Telephone Encounter (Signed)
Per note 11/28/19: 5. Moderate episode of recurrent major depressive disorder (HCC) Intolerant of cymbalta per patient Recommend Cymbalta 30 mg every other day for 2 weeks then stop Refill refused.

## 2019-12-19 NOTE — Telephone Encounter (Signed)
See note

## 2019-12-19 NOTE — Telephone Encounter (Signed)
Does she need an appt?

## 2019-12-23 DIAGNOSIS — H4052X3 Glaucoma secondary to other eye disorders, left eye, severe stage: Secondary | ICD-10-CM | POA: Diagnosis not present

## 2019-12-23 DIAGNOSIS — H4042X3 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, left eye, severe stage: Secondary | ICD-10-CM | POA: Insufficient documentation

## 2019-12-23 DIAGNOSIS — E1139 Type 2 diabetes mellitus with other diabetic ophthalmic complication: Secondary | ICD-10-CM | POA: Diagnosis not present

## 2019-12-23 DIAGNOSIS — H4089 Other specified glaucoma: Secondary | ICD-10-CM | POA: Diagnosis not present

## 2019-12-23 DIAGNOSIS — Z961 Presence of intraocular lens: Secondary | ICD-10-CM | POA: Diagnosis not present

## 2019-12-23 DIAGNOSIS — E113593 Type 2 diabetes mellitus with proliferative diabetic retinopathy without macular edema, bilateral: Secondary | ICD-10-CM | POA: Diagnosis not present

## 2019-12-24 DIAGNOSIS — Z20822 Contact with and (suspected) exposure to covid-19: Secondary | ICD-10-CM | POA: Diagnosis not present

## 2019-12-24 DIAGNOSIS — Z01812 Encounter for preprocedural laboratory examination: Secondary | ICD-10-CM | POA: Diagnosis not present

## 2019-12-27 DIAGNOSIS — Z880 Allergy status to penicillin: Secondary | ICD-10-CM | POA: Diagnosis not present

## 2019-12-27 DIAGNOSIS — E1139 Type 2 diabetes mellitus with other diabetic ophthalmic complication: Secondary | ICD-10-CM | POA: Diagnosis not present

## 2019-12-27 DIAGNOSIS — F419 Anxiety disorder, unspecified: Secondary | ICD-10-CM | POA: Diagnosis not present

## 2019-12-27 DIAGNOSIS — E7800 Pure hypercholesterolemia, unspecified: Secondary | ICD-10-CM | POA: Diagnosis not present

## 2019-12-27 DIAGNOSIS — B0051 Herpesviral iridocyclitis: Secondary | ICD-10-CM | POA: Diagnosis not present

## 2019-12-27 DIAGNOSIS — Z87891 Personal history of nicotine dependence: Secondary | ICD-10-CM | POA: Diagnosis not present

## 2019-12-27 DIAGNOSIS — I10 Essential (primary) hypertension: Secondary | ICD-10-CM | POA: Diagnosis not present

## 2019-12-27 DIAGNOSIS — H4042X3 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, left eye, severe stage: Secondary | ICD-10-CM | POA: Diagnosis not present

## 2019-12-27 DIAGNOSIS — H4089 Other specified glaucoma: Secondary | ICD-10-CM | POA: Diagnosis not present

## 2019-12-27 DIAGNOSIS — F329 Major depressive disorder, single episode, unspecified: Secondary | ICD-10-CM | POA: Diagnosis not present

## 2019-12-27 DIAGNOSIS — E1140 Type 2 diabetes mellitus with diabetic neuropathy, unspecified: Secondary | ICD-10-CM | POA: Diagnosis not present

## 2020-01-04 DIAGNOSIS — E139 Other specified diabetes mellitus without complications: Secondary | ICD-10-CM | POA: Diagnosis not present

## 2020-01-04 DIAGNOSIS — Z6830 Body mass index (BMI) 30.0-30.9, adult: Secondary | ICD-10-CM | POA: Diagnosis not present

## 2020-01-04 DIAGNOSIS — D3500 Benign neoplasm of unspecified adrenal gland: Secondary | ICD-10-CM | POA: Diagnosis not present

## 2020-01-04 LAB — HEMOGLOBIN A1C: Hemoglobin A1C: 9.1

## 2020-01-11 ENCOUNTER — Ambulatory Visit: Payer: BC Managed Care – PPO | Admitting: Internal Medicine

## 2020-02-29 ENCOUNTER — Other Ambulatory Visit: Payer: Self-pay

## 2020-02-29 ENCOUNTER — Ambulatory Visit
Admission: EM | Admit: 2020-02-29 | Discharge: 2020-02-29 | Disposition: A | Discharge status: Home / Self Care | Payer: BC Managed Care – PPO | Attending: Emergency Medicine | Admitting: Emergency Medicine

## 2020-02-29 DIAGNOSIS — B029 Zoster without complications: Secondary | ICD-10-CM | POA: Diagnosis not present

## 2020-02-29 MED ORDER — TRAMADOL HCL 50 MG PO TABS: 50.0000 mg | ORAL_TABLET | Freq: Three times a day (TID) | ORAL | 0 refills | Status: DC | PRN

## 2020-02-29 MED ORDER — ACYCLOVIR 800 MG PO TABS: 800.0000 mg | ORAL_TABLET | Freq: Every day | ORAL | 0 refills | Status: AC

## 2020-02-29 NOTE — ED Provider Notes (Signed)
[facility] ____________________________________________  Time seen: Approximately 10:25 AM  I have reviewed the triage vital signs and the nursing notes.   HISTORY  Chief Complaint Headache and Insect Bite  HPI Kelli [name] is a 59 y.o. female presenting for evaluation of skin changes to left forehead present for the last 2 days.  Patient states that she thought maybe she got bit by insects.  Denies seeing any insects or known insect bite.  States the area is sore and painful to that same area.  States some itchiness.  States rash is present to left forehead and left scalp.  States rash is not anywhere else.  Did have chickenpox as a child.  Has had 1 previous shingles vaccine.  Denies vision changes or eye changes.  States had eye surgery, unable to state what type of surgery other than they put "a tube in ", 1 month ago and has follow-up with her ophthalmologist tomorrow.  Denies fevers or recent sickness.  Has tried taking Benadryl without resolution.  Denies other aggravating or alleviating factors.  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   Charcot foot due to diabetes mellitus ([place]) 02/09/2017   Venous insufficiency of both lower extremities 02/09/2017   Chronic idiopathic constipation 01/26/2017   Intractable vomiting with nausea 01/26/2017   Abnormal thyroid blood test 09/30/2016   Essential hypertension 08/21/2016   Adrenal mass ([place]) 08/18/2016   Temporomandibular joint pain 11/02/2014   Moderate episode of recurrent major depressive disorder ([place]) 11/02/2014   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[place])  06/28/2014   Eustachian tube dysfunction 05/10/2014   Hyperlipidemia 10/03/2013   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  Current Outpatient Medications:    acyclovir (ZOVIRAX) 800 MG tablet, Take 1 tablet (800 mg total) by mouth 5 (five) times daily for 7 days., Disp: 35 tablet, Rfl: 0   atropine 1 % ophthalmic solution, Place 1 drop into the left eye 2 (two) times daily., Disp: , Rfl:    brimonidine (ALPHAGAN) 0.2 % ophthalmic solution, INSTILL 1 DROP INTO RIGHT EYE TWICE A DAY, Disp: , Rfl:    Cholecalciferol (VITAMIN D3) 1000 units CAPS, Take 1,000 Units by mouth daily., Disp: , Rfl:    Continuous Blood Gluc Sensor (FREESTYLE LIBRE 14 DAY SENSOR) MISC, USE AS DIRECTED EVERY 14 DAYS, Disp: 6 each, Rfl: 3   Continuous Blood Gluc Sensor ([place]) MISC, by Other route every fourteen (14) days., Disp: , Rfl:    dorzolamide-timolol (COSOPT) 22.3-6.8 MG/ML ophthalmic solution, INSTILL 1 DROP INTO LEFT EYE TWICE A DAY, Disp: , Rfl:    Insulin Degludec (TRESIBA) 100 UNIT/ML SOLN, Inject 20 Units into the skin daily., Disp: , Rfl:    insulin lispro (HUMALOG) 100  UNIT/ML KiwkPen, 15 units with meal - on sliding scale., Disp: , Rfl:    Latanoprost 0.005 % EMUL, PLACE 1 DROP IN BOTH EYES EVERY NIGHT AT BEDTIME, Disp: , Rfl:    moxifloxacin (VIGAMOX) 0.5 % ophthalmic solution, Apply 1 drop to eye 4 (four) times daily., Disp: , Rfl:    mupirocin cream (BACTROBAN) 2 %, Apply 1 application topically 2 (two) times daily., Disp: 15 g, Rfl: 0   traMADol (ULTRAM) 50 MG tablet, Take 1 tablet (50  mg total) by mouth every 8 (eight) hours as needed., Disp: 12 tablet, Rfl: 0  Allergies Gabapentin, Hydrochlorothiazide, Lisinopril, Lyrica [pregabalin], Metformin and related, and Penicillins  Family History  Problem Relation Age of Onset   Heart failure Mother    Diabetes Mother    Breast cancer Neg Hx     Social History Social History   Tobacco Use   Smoking status: Former Smoker   Smokeless tobacco: Never Used  Scientific laboratory technician Use: Never used  Substance Use Topics   Alcohol use: No   Drug use: No    Review of Systems Constitutional: No fever/chills Eyes: No visual changes. ENT: No sore throat. Cardiovascular: Denies chest pain. Respiratory: Denies shortness of breath. Gastrointestinal: No abdominal pain.  Musculoskeletal: Negative for back pain. Skin: Positive for rash.   ____________________________________________   PHYSICAL EXAM:  VITAL SIGNS: ED Triage Vitals  Enc Vitals Group     BP 02/29/20 1007 (!) 158/94     Pulse Rate 02/29/20 1007 89     Resp 02/29/20 1007 16     Temp 02/29/20 1007 97.8 F (36.6 C)     Temp src --      SpO2 02/29/20 1007 99 %     Weight --      Height --      Head Circumference --      Peak Flow --      Pain Score 02/29/20 1008 8     Pain Loc --      Pain Edu? --      Excl. in [place]? --     Constitutional: Alert and oriented. Well appearing and in no acute distress. Eyes: Conjunctivae are normal. PERRL. EOMI.No pain with EOMs.  No surrounding tenderness, swelling, erythema or rash around eyes bilaterally. ENT      Head: Normocephalic and atraumatic. Hematological/Lymphatic/Immunilogical: No cervical lymphadenopathy. Cardiovascular: Normal rate, regular rhythm. Grossly normal heart sounds.  Good peripheral circulation. Respiratory: Normal respiratory effort without tachypnea nor retractions. Breath sounds are clear and equal bilaterally. No wheezes, rales, rhonchi. Musculoskeletal: Steady gait.  Neurologic:   Normal speech and language. No gross focal neurologic deficits are appreciated. Speech is normal. No gait instability. No paresthesias to face.  Skin:  Skin is warm, dry. Except: Erythematous clustered papular and some excoriated rash present to left forehead and left anterior scalp, no further surrounding erythema, no drainage, mildly tender to palpation, does not cross midline, no rash to eyebrow or below, no rash around eye. Psychiatric: Mood and affect are normal. Speech and behavior are normal. Patient exhibits appropriate insight and judgment   ___________________________________________   LABS (all labs ordered are listed, but only abnormal results are displayed)  Labs Reviewed - No data to display  PROCEDURES Procedures   INITIAL IMPRESSION / ASSESSMENT AND PLAN / ED COURSE  Pertinent labs & imaging results that were available during my care of the patient were reviewed by me and considered in my medical decision making (see  chart for details).  Well-appearing patient.  No acute distress.  Rash clinical appearance consistent with shingles.  Discussed treatment options with patient, will start oral antiviral therapy, patient reports she has had nausea in the past with Valtrex requested different antiviral, Rx for acyclovir given.  As needed tramadol.  [place] controlled substance database reviewed, no issues.Discussed indication, risks and benefits of medications with patient.  Follow-up with ophthalmology as planned, counseled to inform them of current diagnosis.  Follow-up with primary care this week. Discussed follow up and return parameters including no resolution or any worsening concerns. Patient verbalized understanding and agreed to plan.   ____________________________________________   FINAL CLINICAL IMPRESSION(S) / ED DIAGNOSES  Final diagnoses:  Herpes zoster without complication     ED Discharge Orders         Ordered    acyclovir (ZOVIRAX) 800 MG tablet  5  times daily     Discontinue  Reprint     02/29/20 1028    traMADol (ULTRAM) 50 MG tablet  Every 8 hours PRN     Discontinue  Reprint     02/29/20 1028           Note: This dictation was prepared with Dragon dictation along with smaller phrase technology. Any transcriptional errors that result from this process are unintentional.         Marylene Land, NP 02/29/20 1129

## 2020-02-29 NOTE — ED Triage Notes (Signed)
Pt c/o wounds to forehead and left temple area, now having headaches x 3 days. Pt believes to be insect bites after working in garden

## 2020-02-29 NOTE — Discharge Instructions (Addendum)
Take medication as prescribed. Rest. Drink plenty of fluids. Monitor closely.   Follow up with your primary care physician this week for follow up.  Follow-up with your ophthalmologist as scheduled.  Prompt reevaluation for any eye pain, vision difficulty or eye complaints.  Return to urgent care as needed.

## 2020-03-01 ENCOUNTER — Ambulatory Visit: Payer: Self-pay

## 2020-03-01 NOTE — Telephone Encounter (Signed)
Patient called stating that she is nauseated and has vomited 3 times today.  She has been to UC and Dx with shingles on her forhead.  She feels that the pain medication tramadol is causing the nausea.  She also is taking acyclovir. She states that she has taken a Zofran ODT and she finally feels better. She is sipping fluids now.  She called to request a reorder. Medication is not on active list. Se states she has received it in the past from hospital. Please advice patient in am. Requesting Zofran ODT call to her pharmacy.  Reason for Disposition . Taking prescription medication that could cause nausea (e.g., narcotics/opiates, antibiotics, OCPs, many others)  Answer Assessment - Initial Assessment Questions 1. NAUSEA SEVERITY: "How bad is the nausea?" (e.g., mild, moderate, severe; dehydration, weight loss)   - MILD: loss of appetite without change in eating habits   - MODERATE: decreased oral intake without significant weight loss, dehydration, or malnutrition   - SEVERE: inadequate caloric or fluid intake, significant weight loss, symptoms of dehydration     Can't keep anything down 2. ONSET: "When did the nausea begin?"     With medication 3. VOMITING: "Any vomiting?" If Yes, ask: "How many times today?"     3 times today 4. RECURRENT SYMPTOM: "Have you had nausea before?" If Yes, ask: "When was the last time?" "What happened that time?"     Nausea medication 5. CAUSE: "What do you think is causing the nausea?"    Medication given for shingles 6. PREGNANCY: "Is there any chance you are pregnant?" (e.g., unprotected intercourse, missed birth control pill, broken condom)     N/A  Protocols used: NAUSEA-A-AH

## 2020-03-02 ENCOUNTER — Telehealth: Payer: Self-pay | Admitting: Internal Medicine

## 2020-03-02 ENCOUNTER — Other Ambulatory Visit: Payer: Self-pay | Admitting: Internal Medicine

## 2020-03-02 MED ORDER — ONDANSETRON 4 MG PO TBDP: 4.0000 mg | ORAL_TABLET | ORAL | 0 refills | Status: DC | PRN

## 2020-03-02 NOTE — Telephone Encounter (Signed)
Called pt let her know that Zofran was sent to [facility] in [place]. Pt verbalized understanding.  KP

## 2020-03-02 NOTE — Telephone Encounter (Signed)
Offered patient appointment to follow up with PCP as per 7/8 Nurse Triage note , patient declined stating she's nausea and vomiting unable to drive. Patient requesting PCP to send in ondansetron medication which will help her tolerate the shingle medication  that urgent care prescribed. Patient would like a follow up call today

## 2020-03-02 NOTE — Telephone Encounter (Signed)
Called pt left message that she needs to be seen before Dr. B can prescribe her the medication. Told her to call back to schedule appt.  KP

## 2020-03-02 NOTE — Telephone Encounter (Signed)
Please Advise.  KP

## 2020-03-02 NOTE — Telephone Encounter (Signed)
Rx sent to [facility] in [place].

## 2020-03-20 DIAGNOSIS — E113513 Type 2 diabetes mellitus with proliferative diabetic retinopathy with macular edema, bilateral: Secondary | ICD-10-CM | POA: Diagnosis not present

## 2020-04-05 DIAGNOSIS — H4052X3 Glaucoma secondary to other eye disorders, left eye, severe stage: Secondary | ICD-10-CM | POA: Diagnosis not present

## 2020-04-05 LAB — HM DIABETES EYE EXAM

## 2020-05-30 ENCOUNTER — Other Ambulatory Visit: Payer: Self-pay

## 2020-05-30 ENCOUNTER — Ambulatory Visit (INDEPENDENT_AMBULATORY_CARE_PROVIDER_SITE_OTHER): Payer: BC Managed Care – PPO | Admitting: Internal Medicine

## 2020-05-30 ENCOUNTER — Other Ambulatory Visit
Admission: RE | Admit: 2020-05-30 | Discharge: 2020-05-30 | Disposition: A | Discharge status: Home / Self Care | Payer: BC Managed Care – PPO | Attending: Internal Medicine | Admitting: Internal Medicine

## 2020-05-30 ENCOUNTER — Encounter: Payer: Self-pay | Admitting: Internal Medicine

## 2020-05-30 VITALS — BP 118/72 | HR 73 | Ht 67.0 in | Wt 189.0 lb

## 2020-05-30 DIAGNOSIS — I10 Essential (primary) hypertension: Secondary | ICD-10-CM

## 2020-05-30 DIAGNOSIS — E1042 Type 1 diabetes mellitus with diabetic polyneuropathy: Secondary | ICD-10-CM | POA: Insufficient documentation

## 2020-05-30 DIAGNOSIS — N1832 Chronic kidney disease, stage 3b: Secondary | ICD-10-CM | POA: Insufficient documentation

## 2020-05-30 DIAGNOSIS — F5104 Psychophysiologic insomnia: Secondary | ICD-10-CM

## 2020-05-30 DIAGNOSIS — F331 Major depressive disorder, recurrent, moderate: Secondary | ICD-10-CM | POA: Diagnosis not present

## 2020-05-30 DIAGNOSIS — E782 Mixed hyperlipidemia: Secondary | ICD-10-CM | POA: Diagnosis not present

## 2020-05-30 DIAGNOSIS — N185 Chronic kidney disease, stage 5: Secondary | ICD-10-CM | POA: Insufficient documentation

## 2020-05-30 LAB — COMPREHENSIVE METABOLIC PANEL
ALT: 15 U/L (ref 0–44)
AST: 19 U/L (ref 15–41)
Albumin: 3.9 g/dL (ref 3.5–5.0)
Alkaline Phosphatase: 67 U/L (ref 38–126)
Anion gap: 7 (ref 5–15)
BUN: 49 mg/dL — ABNORMAL HIGH (ref 6–20)
CO2: 23 mmol/L (ref 22–32)
Calcium: 8.8 mg/dL — ABNORMAL LOW (ref 8.9–10.3)
Chloride: 108 mmol/L (ref 98–111)
Creatinine, Ser: 2.28 mg/dL — ABNORMAL HIGH (ref 0.44–1.00)
GFR calc non Af Amer: 23 mL/min — ABNORMAL LOW (ref >60)
Glucose, Bld: 122 mg/dL — ABNORMAL HIGH (ref 70–99)
Potassium: 4.3 mmol/L (ref 3.5–5.1)
Sodium: 138 mmol/L (ref 135–145)
Total Bilirubin: 0.5 mg/dL (ref 0.3–1.2)
Total Protein: 7.1 g/dL (ref 6.5–8.1)

## 2020-05-30 MED ORDER — TRAZODONE HCL 50 MG PO TABS: 50.0000 mg | ORAL_TABLET | Freq: Every evening | ORAL | 1 refills | Status: DC | PRN

## 2020-05-30 MED ORDER — ESCITALOPRAM OXALATE 10 MG PO TABS: 10.0000 mg | ORAL_TABLET | Freq: Every day | ORAL | 1 refills | Status: DC

## 2020-05-30 NOTE — Progress Notes (Signed)
Date:  05/30/2020   Name:  Kelli [name]   DOB:  Jun 23, 1961   MRN:  [id number]   Chief Complaint: Hyperlipidemia (Follow up- Stopped medication 2 weeks after last visit.), Hypertension (Follow up. ), and Depression  Hyperlipidemia This is a chronic problem. The problem is uncontrolled. Exacerbating diseases include chronic renal disease. Pertinent negatives include no chest pain or shortness of breath. Treatments tried: lipitor started last visit but only took for 2 weeks.  Hypertension This is a chronic problem. Pertinent negatives include no chest pain, palpitations or shortness of breath. Past treatments include beta blockers (losartan low dose added last visit but quit after a few weeks). Compliance problems include medication side effects (she said she feel tired and did not know which medication it was so stopped both).  Hypertensive end-organ damage includes kidney disease. Identifiable causes of hypertension include chronic renal disease.  Depression        This is a recurrent problem.  The current episode started more than 1 month ago.   The problem has been gradually worsening since onset.  Associated symptoms include decreased concentration, fatigue, hopelessness, insomnia, decreased interest, appetite change, body aches, sad and suicidal ideas (but no plan).  Past treatments include SNRIs - Serotonin and norepinephrine reuptake inhibitors (stopped Cymbalta due to constipation 6 months ago).   Lab Results  Component Value Date   CREATININE 1.70 (H) 07/18/2019   BUN 47 (H) 07/18/2019   NA 133 (L) 07/18/2019   K 4.6 07/18/2019   CL 99 07/18/2019   CO2 25 07/18/2019   Lab Results  Component Value Date   CHOL 226 (H) 12/08/2019   HDL 45 12/08/2019   LDLCALC 132 (H) 12/08/2019   TRIG 245 (H) 12/08/2019   CHOLHDL 5.0 12/08/2019   Lab Results  Component Value Date   TSH 1.794 09/02/2018   Lab Results  Component Value Date   HGBA1C 9.1 01/04/2020   Lab Results    Component Value Date   WBC 7.0 09/02/2018   HGB 11.7 (L) 09/02/2018   HCT 36.3 09/02/2018   MCV 87.1 09/02/2018   PLT 347 09/02/2018   Lab Results  Component Value Date   ALT 16 04/15/2019   AST 22 04/15/2019   ALKPHOS 83 04/15/2019   BILITOT 0.4 04/15/2019     Review of Systems  Constitutional: Positive for appetite change and fatigue.  Eyes: Positive for visual disturbance.  Respiratory: Negative for chest tightness and shortness of breath.   Cardiovascular: Negative for chest pain, palpitations and leg swelling.  Gastrointestinal: Negative for abdominal pain, constipation and diarrhea.  Neurological: Negative for dizziness, tremors and weakness.  Psychiatric/Behavioral: Positive for decreased concentration, depression, dysphoric mood and suicidal ideas (but no plan). The patient is nervous/anxious and has insomnia.     Patient Active Problem List   Diagnosis Date Noted  . Stage 3b chronic kidney disease ([place]) 05/30/2020  . 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 Mixed hyperlipidemia 06/30/2019  . Hypomagnesemia 06/30/2019  . Microalbuminuria 09/24/2018  . Neuropathy of left foot 03/18/2017  . 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 Charcot foot due to diabetes mellitus ([place]) 02/09/2017  . Venous insufficiency of both lower extremities 02/09/2017  . Chronic idiopathic constipation 01/26/2017  . Intractable vomiting with nausea 01/26/2017  . Abnormal thyroid blood test 09/30/2016  . Essential hypertension 08/21/2016  . Adrenal mass ([place]) 08/18/2016  . Temporomandibular  joint pain 11/02/2014  . Moderate episode of recurrent major depressive disorder ([place]) 11/02/2014  . 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[place]) 06/28/2014  . Eustachian tube dysfunction 05/10/2014  . Hyperlipidemia 10/03/2013  . 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 Vitamin D  deficiency 12/30/2012  . Corns and callosity 07/09/2012  . Hammer toe, acquired 07/09/2012    Allergies  Allergen Reactions  . Gabapentin Swelling  . Hydrochlorothiazide Nausea And Vomiting  . Lisinopril Other (See Comments)    fatigue  . Lyrica [Pregabalin] Swelling  . Metformin And Related Nausea And Vomiting  . Penicillins Nausea And Vomiting    Past Surgical History:  Procedure Laterality Date  . ABDOMINAL HYSTERECTOMY    . COLONOSCOPY W/ POLYPECTOMY    . ESOPHAGOGASTRODUODENOSCOPY  03/02/2017   Normal  . HEMORRHOID SURGERY      Social History   Tobacco Use  . Smoking status: Former Research scientist (life sciences)  . Smokeless tobacco: Never Used  Vaping Use  . Vaping Use: Never used  Substance Use Topics  . Alcohol use: No  . Drug use: No     Medication list has been reviewed and updated.  Current Meds  Medication Sig  . atropine 1 % ophthalmic solution Place 1 drop into the left eye 2 (two) times daily.  . B-D ULTRAFINE III SHORT PEN 31G X 8 MM MISC Inject into the skin.  Marland Kitchen brimonidine (ALPHAGAN) 0.2 % ophthalmic solution INSTILL 1 DROP INTO RIGHT EYE TWICE A DAY  . calcium carbonate (TUMS EX) 750 MG chewable tablet Chew by mouth.  . Cholecalciferol (VITAMIN D3) 1000 units CAPS Take 1,000 Units by mouth daily.  . Continuous Blood Gluc Receiver (FREESTYLE LIBRE 14 DAY READER) DEVI See admin instructions.  . Continuous Blood Gluc Receiver (FREESTYLE LIBRE 2 READER) DEVI See admin instructions.  . Continuous Blood Gluc Sensor (FREESTYLE LIBRE 14 DAY SENSOR) MISC USE AS DIRECTED EVERY 14 DAYS  . Continuous Blood Gluc Sensor ([place]) MISC by Other route every fourteen (14) days.  . dorzolamide-timolol (COSOPT) 22.3-6.8 MG/ML ophthalmic solution INSTILL 1 DROP INTO LEFT EYE TWICE A DAY  . Insulin Degludec (TRESIBA) 100 UNIT/ML SOLN Inject 20 Units into the skin daily.  . insulin lispro (HUMALOG) 100 UNIT/ML KiwkPen 15 units with meal - on sliding scale.  .  Latanoprost 0.005 % EMUL PLACE 1 DROP IN BOTH EYES EVERY NIGHT AT BEDTIME    PHQ 2/9 Scores 05/30/2020 12/08/2019 11/10/2019 07/18/2019  PHQ - 2 Score 6 6 2 4   PHQ- 9 Score 24 15 6 12     GAD 7 : Generalized Anxiety Score 05/30/2020 12/08/2019  Nervous, Anxious, on Edge 3 3  Control/stop worrying 3 3  Worry too much - different things 3 3  Trouble relaxing 2 3  Restless 2 2  Easily annoyed or irritable 0 0  Afraid - awful might happen 0 0  Total GAD 7 Score 13 14  Anxiety Difficulty Somewhat difficult Somewhat difficult    BP Readings from Last 3 Encounters:  05/30/20 118/72  02/29/20 (!) 158/94  12/08/19 124/78    Physical Exam Vitals and nursing note reviewed.  Constitutional:      General: She is not in acute distress.    Appearance: Normal appearance. She is well-developed.  HENT:     Head: Normocephalic and atraumatic.  Cardiovascular:     Rate and Rhythm: Normal rate and regular rhythm.     Pulses: Normal pulses.     Heart sounds:  No murmur heard.   Pulmonary:     Effort: Pulmonary effort is normal. No respiratory distress.     Breath sounds: No wheezing or rhonchi.  Musculoskeletal:     Cervical back: Normal range of motion.     Right lower leg: No edema.     Left lower leg: No edema.  Lymphadenopathy:     Cervical: No cervical adenopathy.  Skin:    General: Skin is warm and dry.     Capillary Refill: Capillary refill takes less than 2 seconds.     Findings: No rash.  Neurological:     General: No focal deficit present.     Mental Status: She is alert and oriented to person, place, and time.  Psychiatric:        Mood and Affect: Mood normal.     Wt Readings from Last 3 Encounters:  05/30/20 189 lb (85.7 kg)  12/08/19 181 lb (82.1 kg)  11/10/19 192 lb (87.1 kg)    BP 118/72   Pulse 73   Ht 5\' 7"  (1.702 m)   Wt 189 lb (85.7 kg)   SpO2 98%   BMI 29.60 kg/m   Assessment and Plan: 1. Essential hypertension Clinically stable exam with well  controlled BP. Tolerating medications without side effects at this time. Pt to continue current regimen and low sodium diet; benefits of regular exercise as able discussed.  2. Mixed hyperlipidemia Did not tolerate lipitor but would recommend another trial when current issues improve  3. 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[place]) Followed by endocrinology - Hemoglobin A1c  4. Moderate episode of recurrent major depressive disorder ([place]) Needs to resume medication Titrate up Lexapro and follow up in 2 months - escitalopram (LEXAPRO) 10 MG tablet; Take 1 tablet (10 mg total) by mouth daily.  Dispense: 30 tablet; Refill: 1  5. Stage 3b chronic kidney disease ([place]) Check labs today Resume Losartan after one month - Comprehensive metabolic panel  6. Psychophysiological insomnia - traZODone (DESYREL) 50 MG tablet; Take 1 tablet (50 mg total) by mouth at bedtime as needed for sleep.  Dispense: 30 tablet; Refill: 1   Partially dictated using Editor, commissioning. Any errors are unintentional.  Halina Maidens, MD [facility]  05/30/2020

## 2020-05-30 NOTE — Progress Notes (Unsigned)
Date:  11/29/2019   Name:  Kelli [name]   DOB:  05-16-1961   MRN:  [id number]   Chief Complaint: No chief complaint on file.  Hypertension Past treatments include angiotensin blockers and beta blockers (losartan added last visit).  Hyperlipidemia This is a chronic problem. The problem is uncontrolled. Current antihyperlipidemic treatment includes statins (statin started last visit).    Lab Results  Component Value Date   CREATININE 1.70 (H) 07/18/2019   BUN 47 (H) 07/18/2019   NA 133 (L) 07/18/2019   K 4.6 07/18/2019   CL 99 07/18/2019   CO2 25 07/18/2019   Lab Results  Component Value Date   CHOL 226 (H) 12/08/2019   HDL 45 12/08/2019   LDLCALC 132 (H) 12/08/2019   TRIG 245 (H) 12/08/2019   CHOLHDL 5.0 12/08/2019   Lab Results  Component Value Date   TSH 1.794 09/02/2018   Lab Results  Component Value Date   HGBA1C 9.1 01/04/2020   Lab Results  Component Value Date   WBC 7.0 09/02/2018   HGB 11.7 (L) 09/02/2018   HCT 36.3 09/02/2018   MCV 87.1 09/02/2018   PLT 347 09/02/2018   Lab Results  Component Value Date   ALT 16 04/15/2019   AST 22 04/15/2019   ALKPHOS 83 04/15/2019   BILITOT 0.4 04/15/2019     Review of Systems  Patient Active Problem List   Diagnosis Date Noted  . 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 Hypokalemia 06/30/2019  . Hypomagnesemia 06/30/2019  . Microalbuminuria 09/24/2018  . Neuropathy of left foot 03/18/2017  . 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 Charcot foot due to diabetes mellitus ([place]) 02/09/2017  . Venous insufficiency of both lower extremities 02/09/2017  . Chronic idiopathic constipation 01/26/2017  . Intractable vomiting with nausea 01/26/2017  . Abnormal thyroid blood test 09/30/2016  . Essential hypertension 08/21/2016  . Adrenal mass ([place]) 08/18/2016  . Temporomandibular joint pain 11/02/2014  .  Moderate episode of recurrent major depressive disorder ([place]) 11/02/2014  . 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[place]) 06/28/2014  . Eustachian tube dysfunction 05/10/2014  . Hyperlipidemia 10/03/2013  . 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 Vitamin D deficiency 12/30/2012  . Corns and callosity 07/09/2012  . Hammer toe, acquired 07/09/2012    Allergies  Allergen Reactions  . Gabapentin Swelling  . Hydrochlorothiazide Nausea And Vomiting  . Lisinopril Other (See Comments)    fatigue  . Lyrica [Pregabalin] Swelling  . Metformin And Related Nausea And Vomiting  . Penicillins Nausea And Vomiting    Past Surgical History:  Procedure Laterality Date  . ABDOMINAL HYSTERECTOMY    . COLONOSCOPY W/ POLYPECTOMY    . ESOPHAGOGASTRODUODENOSCOPY  03/02/2017   Normal  . HEMORRHOID SURGERY      Social History   Tobacco Use  . Smoking status: Former Research scientist (life sciences)  . Smokeless tobacco: Never Used  Vaping Use  . Vaping Use: Never used  Substance Use Topics  . Alcohol use: No  . Drug use: No     Medication list has been reviewed and updated.  Current Meds  Medication Sig  . Continuous Blood Gluc Receiver (FREESTYLE LIBRE 14 DAY READER) DEVI See admin instructions.    PHQ 2/9 Scores 12/08/2019 11/10/2019 07/18/2019 04/15/2019  PHQ - 2 Score 6 2 4 2   PHQ- 9 Score 15 6 12  15  GAD 7 : Generalized Anxiety Score 12/08/2019  Nervous, Anxious, on Edge 3  Control/stop worrying 3  Worry too much - different things 3  Trouble relaxing 3  Restless 2  Easily annoyed or irritable 0  Afraid - awful might happen 0  Total GAD 7 Score 14  Anxiety Difficulty Somewhat difficult    BP Readings from Last 3 Encounters:  02/29/20 (!) 158/94  12/08/19 124/78  11/10/19 140/66    Physical Exam  Wt Readings from Last 3 Encounters:  12/08/19 181 lb (82.1 kg)  11/10/19 192 lb (87.1 kg)  07/18/19 183 lb (83 kg)    There were no vitals taken for this visit.  Assessment  and Plan:

## 2020-05-30 NOTE — Patient Instructions (Signed)
Start with Lexapro 1/2 tablet for 6 days then take a whole tablet daily.  After a month, then resume the Losartan.  Follow up with me in 2 months.

## 2020-05-31 LAB — HEMOGLOBIN A1C
Hgb A1c MFr Bld: 7.5 % — ABNORMAL HIGH (ref 4.8–5.6)
Mean Plasma Glucose: 168.55 mg/dL

## 2020-06-01 ENCOUNTER — Other Ambulatory Visit: Payer: Self-pay

## 2020-06-01 DIAGNOSIS — N1832 Chronic kidney disease, stage 3b: Secondary | ICD-10-CM

## 2020-06-01 NOTE — Progress Notes (Unsigned)
neph ref placed

## 2020-06-08 ENCOUNTER — Other Ambulatory Visit: Payer: Self-pay | Admitting: Internal Medicine

## 2020-06-08 NOTE — Telephone Encounter (Signed)
Requested Prescriptions  Pending Prescriptions Disp Refills   Continuous Blood Gluc Sensor (FREESTYLE LIBRE 14 DAY SENSOR) MISC [Pharmacy Med Name: FREESTYLE LIBRE 14 DAY SENSOR] 6 each 3    Sig: USE AS DIRECTED EVERY 73 DAYS     Endocrinology: Diabetes - Testing Supplies Passed - 06/08/2020  2:06 PM      Passed - Valid encounter within last 12 months    Recent Outpatient Visits          1 week ago Essential hypertension   [facility] Glean Hess, MD   6 months ago Essential hypertension   [facility] Glean Hess, MD   7 months ago Cellulitis of toe of right foot   [facility] Glean Hess, MD   10 months ago Essential hypertension   [facility] Glean Hess, MD   1 year ago Moderate episode of recurrent major depressive disorder [facility])   [facility] Glean Hess, MD      Future Appointments            In 1 month Army Melia, Jesse Sans, MD [facility], [facility]

## 2020-06-14 DIAGNOSIS — E113513 Type 2 diabetes mellitus with proliferative diabetic retinopathy with macular edema, bilateral: Secondary | ICD-10-CM | POA: Diagnosis not present

## 2020-06-14 DIAGNOSIS — H4089 Other specified glaucoma: Secondary | ICD-10-CM | POA: Diagnosis not present

## 2020-06-21 ENCOUNTER — Other Ambulatory Visit: Payer: Self-pay | Admitting: Internal Medicine

## 2020-06-21 DIAGNOSIS — F5104 Psychophysiologic insomnia: Secondary | ICD-10-CM

## 2020-06-21 DIAGNOSIS — F331 Major depressive disorder, recurrent, moderate: Secondary | ICD-10-CM

## 2020-07-02 ENCOUNTER — Other Ambulatory Visit: Payer: Self-pay | Admitting: Nephrology

## 2020-07-02 ENCOUNTER — Other Ambulatory Visit (HOSPITAL_COMMUNITY): Payer: Self-pay | Admitting: Nephrology

## 2020-07-02 DIAGNOSIS — N179 Acute kidney failure, unspecified: Secondary | ICD-10-CM

## 2020-07-02 DIAGNOSIS — N183 Chronic kidney disease, stage 3 unspecified: Secondary | ICD-10-CM

## 2020-07-02 DIAGNOSIS — E1022 Type 1 diabetes mellitus with diabetic chronic kidney disease: Secondary | ICD-10-CM | POA: Diagnosis not present

## 2020-07-02 DIAGNOSIS — N1832 Chronic kidney disease, stage 3b: Secondary | ICD-10-CM | POA: Diagnosis not present

## 2020-07-02 DIAGNOSIS — I10 Essential (primary) hypertension: Secondary | ICD-10-CM | POA: Diagnosis not present

## 2020-07-05 ENCOUNTER — Other Ambulatory Visit: Payer: Self-pay

## 2020-07-05 ENCOUNTER — Ambulatory Visit
Admission: RE | Admit: 2020-07-05 | Discharge: 2020-07-05 | Disposition: A | Discharge status: Home / Self Care | Payer: BC Managed Care – PPO | Source: Ambulatory Visit | Attending: Nephrology | Admitting: Nephrology

## 2020-07-05 DIAGNOSIS — N183 Chronic kidney disease, stage 3 unspecified: Secondary | ICD-10-CM | POA: Diagnosis not present

## 2020-07-05 DIAGNOSIS — N179 Acute kidney failure, unspecified: Secondary | ICD-10-CM

## 2020-07-05 DIAGNOSIS — N189 Chronic kidney disease, unspecified: Secondary | ICD-10-CM | POA: Diagnosis not present

## 2020-07-09 DIAGNOSIS — H5461 Unqualified visual loss, right eye, normal vision left eye: Secondary | ICD-10-CM | POA: Diagnosis not present

## 2020-07-09 DIAGNOSIS — H3581 Retinal edema: Secondary | ICD-10-CM | POA: Diagnosis not present

## 2020-07-09 DIAGNOSIS — E11311 Type 2 diabetes mellitus with unspecified diabetic retinopathy with macular edema: Secondary | ICD-10-CM | POA: Diagnosis not present

## 2020-07-09 DIAGNOSIS — H4052X3 Glaucoma secondary to other eye disorders, left eye, severe stage: Secondary | ICD-10-CM | POA: Diagnosis not present

## 2020-07-10 DIAGNOSIS — E113511 Type 2 diabetes mellitus with proliferative diabetic retinopathy with macular edema, right eye: Secondary | ICD-10-CM | POA: Diagnosis not present

## 2020-07-10 DIAGNOSIS — E113513 Type 2 diabetes mellitus with proliferative diabetic retinopathy with macular edema, bilateral: Secondary | ICD-10-CM | POA: Diagnosis not present

## 2020-07-30 ENCOUNTER — Encounter: Payer: Self-pay | Admitting: Internal Medicine

## 2020-07-30 ENCOUNTER — Ambulatory Visit (INDEPENDENT_AMBULATORY_CARE_PROVIDER_SITE_OTHER): Payer: BC Managed Care – PPO | Admitting: Internal Medicine

## 2020-07-30 ENCOUNTER — Other Ambulatory Visit: Payer: Self-pay

## 2020-07-30 VITALS — BP 124/70 | HR 82 | Temp 98.3°F | Ht 67.0 in | Wt 189.0 lb

## 2020-07-30 DIAGNOSIS — E1042 Type 1 diabetes mellitus with diabetic polyneuropathy: Secondary | ICD-10-CM | POA: Diagnosis not present

## 2020-07-30 DIAGNOSIS — I10 Essential (primary) hypertension: Secondary | ICD-10-CM

## 2020-07-30 DIAGNOSIS — F331 Major depressive disorder, recurrent, moderate: Secondary | ICD-10-CM | POA: Diagnosis not present

## 2020-07-30 DIAGNOSIS — E782 Mixed hyperlipidemia: Secondary | ICD-10-CM

## 2020-07-30 MED ORDER — MUPIROCIN 2 % EX OINT: 1.0000 "application " | TOPICAL_OINTMENT | Freq: Two times a day (BID) | CUTANEOUS | 2 refills | Status: DC

## 2020-07-30 MED ORDER — ATORVASTATIN CALCIUM 10 MG PO TABS: 10.0000 mg | ORAL_TABLET | Freq: Every day | ORAL | 0 refills | Status: DC

## 2020-07-30 NOTE — Progress Notes (Signed)
Date:  07/30/2020   Name:  Kelli [name]   DOB:  July 28, 1961   MRN:  [id number]   Chief Complaint: Depression (f/u)  Depression        This is a chronic problem.  The problem has been gradually improving since onset.  Associated symptoms include myalgias.  Associated symptoms include no fatigue, no helplessness, no hopelessness, no decreased interest, no appetite change, no headaches and no suicidal ideas.  Past treatments include SSRIs - Selective serotonin reuptake inhibitors (cymbalta stopped and lexapro started in October).  Compliance with treatment is good.  Previous treatment provided significant relief. Diabetes She presents for her follow-up diabetic visit. She has type 1 (type 1.5 treated as type 1) diabetes mellitus. Her disease course has been stable (last a1c 7.5). Pertinent negatives for hypoglycemia include no headaches, nervousness/anxiousness or tremors. Associated symptoms include foot paresthesias and visual change. Pertinent negatives for diabetes include no chest pain, no fatigue, no polydipsia and no polyuria. Diabetic complications include nephropathy and peripheral neuropathy. Current diabetic treatment includes insulin injections. She is compliant with treatment most of the time. An ACE inhibitor/angiotensin II receptor blocker is being taken (supposed to be on losartan 25 mg).  Hyperlipidemia This is a chronic problem. The problem is uncontrolled. Associated symptoms include myalgias. Pertinent negatives include no chest pain or shortness of breath. She is currently on no antihyperlipidemic treatment (patient encouraged to take atorvastatin but stopped it due to questionable side effects).    Lab Results  Component Value Date   CREATININE 2.28 (H) 05/30/2020   BUN 49 (H) 05/30/2020   NA 138 05/30/2020   K 4.3 05/30/2020   CL 108 05/30/2020   CO2 23 05/30/2020   Lab Results  Component Value Date   CHOL 226 (H) 12/08/2019   HDL 45 12/08/2019   LDLCALC 132 (H)  12/08/2019   TRIG 245 (H) 12/08/2019   CHOLHDL 5.0 12/08/2019   Lab Results  Component Value Date   TSH 1.794 09/02/2018   Lab Results  Component Value Date   HGBA1C 7.5 (H) 05/30/2020   Lab Results  Component Value Date   WBC 7.0 09/02/2018   HGB 11.7 (L) 09/02/2018   HCT 36.3 09/02/2018   MCV 87.1 09/02/2018   PLT 347 09/02/2018   Lab Results  Component Value Date   ALT 15 05/30/2020   AST 19 05/30/2020   ALKPHOS 67 05/30/2020   BILITOT 0.5 05/30/2020     Review of Systems  Constitutional: Negative for appetite change, fatigue, fever and unexpected weight change.  HENT: Negative for trouble swallowing.   Eyes: Negative for visual disturbance.  Respiratory: Positive for cough (slight intermittent tickle in throat). Negative for chest tightness and shortness of breath.   Cardiovascular: Negative for chest pain, palpitations and leg swelling.  Gastrointestinal: Negative for abdominal pain.  Endocrine: Negative for polydipsia and polyuria.  Genitourinary: Negative for dysuria and hematuria.  Musculoskeletal: Positive for arthralgias and myalgias.  Neurological: Positive for numbness. Negative for tremors and headaches.  Psychiatric/Behavioral: Positive for depression. Negative for dysphoric mood, sleep disturbance and suicidal ideas. The patient is not nervous/anxious.     Patient Active Problem List   Diagnosis Date Noted  . Stage 3b chronic kidney disease ([employer]) 05/30/2020  . Psychophysiological insomnia 05/30/2020  . 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 Mixed hyperlipidemia 06/30/2019  . Hypomagnesemia 06/30/2019  . Microalbuminuria 09/24/2018  .  Neuropathy of left foot 03/18/2017  . 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 Charcot foot due to diabetes mellitus ([place]) 02/09/2017  . Venous insufficiency of both lower extremities 02/09/2017  . Chronic idiopathic  constipation 01/26/2017  . Intractable vomiting with nausea 01/26/2017  . Abnormal thyroid blood test 09/30/2016  . Essential hypertension 08/21/2016  . Adrenal mass ([place]) 08/18/2016  . Temporomandibular joint pain 11/02/2014  . Moderate episode of recurrent major depressive disorder ([place]) 11/02/2014  . 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[place]) 06/28/2014  . Eustachian tube dysfunction 05/10/2014  . Hyperlipidemia 10/03/2013  . 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 Vitamin D deficiency 12/30/2012  . Corns and callosity 07/09/2012  . Hammer toe, acquired 07/09/2012    Allergies  Allergen Reactions  . Gabapentin Swelling  . Hydrochlorothiazide Nausea And Vomiting  . Lisinopril Other (See Comments)    fatigue  . Lyrica [Pregabalin] Swelling  . Metformin And Related Nausea And Vomiting  . Penicillins Nausea And Vomiting    Past Surgical History:  Procedure Laterality Date  . ABDOMINAL HYSTERECTOMY    . COLONOSCOPY W/ POLYPECTOMY    . ESOPHAGOGASTRODUODENOSCOPY  03/02/2017   Normal  . HEMORRHOID SURGERY      Social History   Tobacco Use  . Smoking status: Former Research scientist (life sciences)  . Smokeless tobacco: Never Used  Vaping Use  . Vaping Use: Never used  Substance Use Topics  . Alcohol use: No  . Drug use: No     Medication list has been reviewed and updated.  Current Meds  Medication Sig  . B-D ULTRAFINE III SHORT PEN 31G X 8 MM MISC Inject into the skin.  . Continuous Blood Gluc Receiver (FREESTYLE LIBRE 14 DAY READER) DEVI See admin instructions.  . Continuous Blood Gluc Sensor (FREESTYLE LIBRE 14 DAY SENSOR) MISC USE AS DIRECTED EVERY 14 DAYS  . escitalopram (LEXAPRO) 10 MG tablet Take 1 tablet (10 mg total) by mouth daily.  . Insulin Degludec (TRESIBA) 100 UNIT/ML SOLN Inject 25 Units into the skin daily.   . insulin lispro (HUMALOG) 100 UNIT/ML KiwkPen 15 units with meal - on sliding scale.  . losartan (COZAAR) 25 MG tablet Take 25 mg by mouth  daily.  . traZODone (DESYREL) 50 MG tablet Take 1 tablet (50 mg total) by mouth at bedtime as needed. for sleep    PHQ 2/9 Scores 07/30/2020 05/30/2020 12/08/2019 11/10/2019  PHQ - 2 Score 0 6 6 2   PHQ- 9 Score 0 24 15 6     GAD 7 : Generalized Anxiety Score 07/30/2020 05/30/2020 12/08/2019  Nervous, Anxious, on Edge 0 3 3  Control/stop worrying 0 3 3  Worry too much - different things 0 3 3  Trouble relaxing 0 2 3  Restless 0 2 2  Easily annoyed or irritable 0 0 0  Afraid - awful might happen 0 0 0  Total GAD 7 Score 0 13 14  Anxiety Difficulty - Somewhat difficult Somewhat difficult    BP Readings from Last 3 Encounters:  07/30/20 124/70  05/30/20 118/72  02/29/20 (!) 158/94    Physical Exam Vitals and nursing note reviewed.  Constitutional:      General: She is not in acute distress.    Appearance: Normal appearance. She is well-developed.  HENT:     Head: Normocephalic and atraumatic.  Neck:     Vascular: No carotid bruit.  Cardiovascular:     Rate and Rhythm: Normal rate and regular rhythm.  Pulses: Normal pulses.     Heart sounds: No murmur heard.   Pulmonary:     Effort: Pulmonary effort is normal. No respiratory distress.     Breath sounds: No wheezing or rhonchi.  Musculoskeletal:     Cervical back: Normal range of motion.     Right lower leg: No edema.     Left lower leg: No edema.  Lymphadenopathy:     Cervical: No cervical adenopathy.  Skin:    General: Skin is warm and dry.     Findings: No rash.  Neurological:     General: No focal deficit present.     Mental Status: She is alert and oriented to person, place, and time.  Psychiatric:        Mood and Affect: Mood normal.        Behavior: Behavior normal.     Wt Readings from Last 3 Encounters:  07/30/20 189 lb (85.7 kg)  05/30/20 189 lb (85.7 kg)  12/08/19 181 lb (82.1 kg)    BP 124/70   Pulse 82   Temp 98.3 F (36.8 C) (Oral)   Ht 5\' 7"  (1.702 m)   Wt 189 lb (85.7 kg)   SpO2 97%    BMI 29.60 kg/m   Assessment and Plan: 1. Moderate episode of recurrent major depressive disorder (HCC) Clinically stable on current regimen with good control of symptoms since starting Lexapro, No SI or HI. Will continue current therapy at the same dose.  2. Essential hypertension Clinically stable exam with well controlled BP on losartan. Tolerating medications without side effects at this time. Pt to continue current regimen and low sodium diet; benefits of regular exercise as able discussed.  3. Mixed hyperlipidemia Pt encouraged to try low dose Lipitor - 1/2 tablet twice a week - atorvastatin (LIPITOR) 10 MG tablet; Take 1 tablet (10 mg total) by mouth daily.  Dispense: 90 tablet; Refill: 0  4. 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HCC) Insulin managed by [facility] End[facility] Discussed with patient the need to take ARB for renal protection, Statin for primary prevention of CAD for which DM is considered an equivalent She will continue regular eye exams and foot exams She is up to date on her immunizations   Partially dictated using Dragon software. Any errors are unintentional.  Halina Maidens, MD [facility]  07/30/2020

## 2020-07-30 NOTE — Patient Instructions (Signed)
Atorvastatin 10 mg - start with 1/2 tablet twice a week

## 2020-08-05 ENCOUNTER — Other Ambulatory Visit: Payer: Self-pay | Admitting: Internal Medicine

## 2020-08-05 DIAGNOSIS — I10 Essential (primary) hypertension: Secondary | ICD-10-CM

## 2020-08-05 DIAGNOSIS — R809 Proteinuria, unspecified: Secondary | ICD-10-CM

## 2020-08-09 DIAGNOSIS — N184 Chronic kidney disease, stage 4 (severe): Secondary | ICD-10-CM | POA: Diagnosis not present

## 2020-08-09 DIAGNOSIS — E1022 Type 1 diabetes mellitus with diabetic chronic kidney disease: Secondary | ICD-10-CM | POA: Diagnosis not present

## 2020-08-09 DIAGNOSIS — I10 Essential (primary) hypertension: Secondary | ICD-10-CM | POA: Diagnosis not present

## 2020-08-09 DIAGNOSIS — N2581 Secondary hyperparathyroidism of renal origin: Secondary | ICD-10-CM | POA: Diagnosis not present

## 2020-08-09 DIAGNOSIS — D631 Anemia in chronic kidney disease: Secondary | ICD-10-CM | POA: Diagnosis not present

## 2020-08-13 ENCOUNTER — Ambulatory Visit
Admission: RE | Admit: 2020-08-13 | Discharge: 2020-08-13 | Disposition: A | Discharge status: Home / Self Care | Payer: BC Managed Care – PPO | Source: Ambulatory Visit | Attending: Internal Medicine | Admitting: Internal Medicine

## 2020-08-13 ENCOUNTER — Other Ambulatory Visit: Payer: Self-pay

## 2020-08-13 DIAGNOSIS — Z1231 Encounter for screening mammogram for malignant neoplasm of breast: Secondary | ICD-10-CM | POA: Insufficient documentation

## 2020-08-14 ENCOUNTER — Other Ambulatory Visit: Payer: Self-pay | Admitting: Internal Medicine

## 2020-08-14 DIAGNOSIS — E782 Mixed hyperlipidemia: Secondary | ICD-10-CM

## 2020-08-14 NOTE — Telephone Encounter (Signed)
Requested medication (s) are due for refill today:   No  Requested medication (s) are on the active medication list:   Zofran is not,  Losartan is not.  Future visit scheduled:   No.   Seen 2 wks ago   Last ordered: Zofran was discontinued 05/30/2020 - non delegated refill                        Atorvastatin 10 mg on 07/30/2020 #90, 0 refills being requested too soon                        Losartan 25 mg is from a historical provider plus it was refused on 08/05/2020 due to "pt preference not taking"     Requested Prescriptions  Pending Prescriptions Disp Refills   ondansetron (ZOFRAN-ODT) 4 MG disintegrating tablet [Pharmacy Med Name: ONDANSETRON ODT 4 MG TABLET] 18 tablet 1    Sig: Take 1 tablet (4 mg total) by mouth as needed.      Not Delegated - Gastroenterology: Antiemetics Failed - 08/14/2020  8:46 AM      Failed - This refill cannot be delegated      Passed - Valid encounter within last 6 months    Recent Outpatient Visits           2 weeks ago Moderate episode of recurrent major depressive disorder [facility])   [facility] Glean Hess, [facility]   2 months ago Essential hypertension   [facility] Glean Hess, MD   8 months ago Essential hypertension   [facility] Glean Hess, MD   9 months ago Cellulitis of toe of right foot   [facility] Glean Hess, MD   1 year ago Essential hypertension   [facility] Glean Hess, MD                  losartan (COZAAR) 25 MG tablet [Pharmacy Med Name: LOSARTAN POTASSIUM 25 MG TAB] 45 tablet 1    Sig: TAKE 1/2 TABLET BY MOUTH EVERY MORNING      Cardiovascular:  Angiotensin Receptor Blockers Failed - 08/14/2020  8:46 AM      Failed - Cr in normal range and within 180 days    Creatinine, Ser  Date Value Ref Range Status  05/30/2020 2.28 (H) 0.44 - 1.00 mg/dL Final          Passed - K in normal range and within 180 days    Potassium  Date Value Ref Range  Status  05/30/2020 4.3 3.5 - 5.1 mmol/L Final          Passed - Patient is not pregnant      Passed - Last BP in normal range    BP Readings from Last 1 Encounters:  07/30/20 124/70          Passed - Valid encounter within last 6 months    Recent Outpatient Visits           2 weeks ago Moderate episode of recurrent major depressive disorder [facility])   [facility] Glean Hess, MD   2 months ago Essential hypertension   [place], Laura H, MD   8 months ago Essential hypertension   [facility] Glean Hess, MD   9 months ago Cellulitis of toe of right foot   [facility] Glean Hess, MD  1 year ago Essential hypertension   [facility] Glean Hess, MD                  atorvastatin (LIPITOR) 10 MG tablet [Pharmacy Med Name: ATORVASTATIN 10 MG TABLET] 90 tablet 0    Sig: TAKE 1 TABLET BY MOUTH EVERY DAY      Cardiovascular:  Antilipid - Statins Failed - 08/14/2020  8:46 AM      Failed - Total Cholesterol in normal range and within 360 days    Cholesterol  Date Value Ref Range Status  12/08/2019 226 (H) 0 - 200 mg/dL Final          Failed - LDL in normal range and within 360 days    LDL Cholesterol  Date Value Ref Range Status  12/08/2019 132 (H) 0 - 99 mg/dL Final    Comment:           Total Cholesterol/HDL:CHD Risk Coronary Heart Disease Risk Table                     Men   Women  1/2 Average Risk   3.4   3.3  Average Risk       5.0   4.4  2 X Average Risk   9.6   7.1  3 X Average Risk  23.4   11.0        Use the calculated Patient Ratio above and the CHD Risk Table to determine the patient's CHD Risk.        ATP III CLASSIFICATION (LDL):  <100     mg/dL   Optimal  100-129  mg/dL   Near or Above                    Optimal  130-159  mg/dL   Borderline  160-189  mg/dL   High  >190     mg/dL   Very High Performed at [facility], [address]  [place], [place] [address]           Failed - Triglycerides in normal range and within 360 days    Triglycerides  Date Value Ref Range Status  12/08/2019 245 (H) <150 mg/dL Final          Passed - HDL in normal range and within 360 days    HDL  Date Value Ref Range Status  12/08/2019 45 >40 mg/dL Final          Passed - Patient is not pregnant      Passed - Valid encounter within last 12 months    Recent Outpatient Visits           2 weeks ago Moderate episode of recurrent major depressive disorder [facility])   [facility] Glean Hess, MD   2 months ago Essential hypertension   [facility] Glean Hess, MD   8 months ago Essential hypertension   [facility] Glean Hess, MD   9 months ago Cellulitis of toe of right foot   [facility] Glean Hess, MD   1 year ago Essential hypertension   [facility] Glean Hess, MD

## 2020-09-17 ENCOUNTER — Telehealth: Payer: Self-pay

## 2020-09-17 DIAGNOSIS — E1022 Type 1 diabetes mellitus with diabetic chronic kidney disease: Secondary | ICD-10-CM | POA: Diagnosis not present

## 2020-09-17 DIAGNOSIS — I10 Essential (primary) hypertension: Secondary | ICD-10-CM | POA: Diagnosis not present

## 2020-09-17 DIAGNOSIS — R809 Proteinuria, unspecified: Secondary | ICD-10-CM | POA: Diagnosis not present

## 2020-09-17 DIAGNOSIS — N184 Chronic kidney disease, stage 4 (severe): Secondary | ICD-10-CM | POA: Diagnosis not present

## 2020-09-17 NOTE — Telephone Encounter (Signed)
Called and spoke with patient about Statin medication. She said she was told to try and start back on this. She said she told Dr. Army Melia in the past she could not take this because they make her sick but she will try. The medication is causing her diarrhea and nausea. She cannot take this. Added this to her allergy list and told patient to stop medication and we will follow up at her next visit.

## 2020-09-17 NOTE — Telephone Encounter (Signed)
Copied from [place] [id number]. Topic: General - Other >> Sep 17, 2020  1:21 PM Keene Breath wrote: Reason for CRM: Medication prescribed to patient for cholesterol is making patient feel sick.  Patient stated that she does have stage IV kidney disease.  She was not sure if doctor wanted to see her before prescribing another medication.  Please call to discuss.  CB# [phone]

## 2020-09-21 ENCOUNTER — Other Ambulatory Visit: Payer: Self-pay

## 2020-09-21 ENCOUNTER — Ambulatory Visit
Admission: RE | Admit: 2020-09-21 | Discharge: 2020-09-21 | Disposition: A | Discharge status: Home / Self Care | Payer: BC Managed Care – PPO | Source: Ambulatory Visit | Attending: Nephrology | Admitting: Nephrology

## 2020-09-21 DIAGNOSIS — Z20822 Contact with and (suspected) exposure to covid-19: Secondary | ICD-10-CM | POA: Insufficient documentation

## 2020-09-21 DIAGNOSIS — Z01812 Encounter for preprocedural laboratory examination: Secondary | ICD-10-CM | POA: Diagnosis not present

## 2020-09-21 LAB — GLUCOSE, CAPILLARY: Glucose-Capillary: 149 mg/dL — ABNORMAL HIGH (ref 70–99)

## 2020-09-21 LAB — SARS CORONAVIRUS 2 BY RT PCR (HOSPITAL ORDER, PERFORMED IN ~~LOC~~ HOSPITAL LAB): SARS Coronavirus 2: NEGATIVE

## 2020-09-21 MED ORDER — HYDRALAZINE HCL 20 MG/ML IJ SOLN
INTRAMUSCULAR | Status: AC
  Administered 2020-09-21: 10 mg via INTRAVENOUS
  Filled 2020-09-21: qty 1

## 2020-09-21 MED ORDER — ONDANSETRON HCL 4 MG/2ML IJ SOLN
INTRAMUSCULAR | Status: AC
  Administered 2020-09-21: 8 mg
  Filled 2020-09-21: qty 4

## 2020-09-21 MED ORDER — ONDANSETRON 8 MG/NS 50 ML IVPB: 8.0000 mg | Freq: Once | INTRAVENOUS | Status: DC

## 2020-09-21 MED ORDER — SODIUM CHLORIDE 0.9 % IV SOLN: INTRAVENOUS | Status: AC

## 2020-09-21 MED ORDER — LABETALOL HCL 5 MG/ML IV SOLN
INTRAVENOUS | Status: AC
  Filled 2020-09-21: qty 4

## 2020-09-21 MED ORDER — METOPROLOL TARTRATE 5 MG/5ML IV SOLN
INTRAVENOUS | Status: AC
  Administered 2020-09-21: 10 mg via INTRAVENOUS
  Filled 2020-09-21: qty 10

## 2020-09-21 MED ORDER — HYDRALAZINE HCL 20 MG/ML IJ SOLN: 10.0000 mg | Freq: Once | INTRAMUSCULAR | Status: AC

## 2020-09-21 MED ORDER — METOPROLOL TARTRATE 5 MG/5ML IV SOLN: 10.0000 mg | Freq: Once | INTRAVENOUS | Status: AC

## 2020-09-22 DIAGNOSIS — I129 Hypertensive chronic kidney disease with stage 1 through stage 4 chronic kidney disease, or unspecified chronic kidney disease: Secondary | ICD-10-CM | POA: Diagnosis not present

## 2020-09-22 DIAGNOSIS — F419 Anxiety disorder, unspecified: Secondary | ICD-10-CM | POA: Diagnosis not present

## 2020-09-22 DIAGNOSIS — R000 Tachycardia, unspecified: Secondary | ICD-10-CM | POA: Diagnosis not present

## 2020-09-22 DIAGNOSIS — Z794 Long term (current) use of insulin: Secondary | ICD-10-CM | POA: Diagnosis not present

## 2020-09-22 DIAGNOSIS — Z20822 Contact with and (suspected) exposure to covid-19: Secondary | ICD-10-CM | POA: Diagnosis not present

## 2020-09-22 DIAGNOSIS — R112 Nausea with vomiting, unspecified: Secondary | ICD-10-CM | POA: Diagnosis not present

## 2020-09-22 DIAGNOSIS — R059 Cough, unspecified: Secondary | ICD-10-CM | POA: Diagnosis not present

## 2020-09-22 DIAGNOSIS — N179 Acute kidney failure, unspecified: Secondary | ICD-10-CM | POA: Diagnosis not present

## 2020-09-22 DIAGNOSIS — M14679 Charcot's joint, unspecified ankle and foot: Secondary | ICD-10-CM | POA: Diagnosis not present

## 2020-09-22 DIAGNOSIS — N184 Chronic kidney disease, stage 4 (severe): Secondary | ICD-10-CM | POA: Diagnosis not present

## 2020-09-22 DIAGNOSIS — R109 Unspecified abdominal pain: Secondary | ICD-10-CM | POA: Diagnosis not present

## 2020-09-22 DIAGNOSIS — I499 Cardiac arrhythmia, unspecified: Secondary | ICD-10-CM | POA: Diagnosis not present

## 2020-09-22 DIAGNOSIS — R509 Fever, unspecified: Secondary | ICD-10-CM | POA: Diagnosis not present

## 2020-09-22 DIAGNOSIS — E785 Hyperlipidemia, unspecified: Secondary | ICD-10-CM | POA: Diagnosis not present

## 2020-09-22 DIAGNOSIS — E1022 Type 1 diabetes mellitus with diabetic chronic kidney disease: Secondary | ICD-10-CM | POA: Diagnosis not present

## 2020-09-22 LAB — HEMOGLOBIN A1C: Hemoglobin A1C: 7.3

## 2020-09-23 DIAGNOSIS — E876 Hypokalemia: Secondary | ICD-10-CM | POA: Diagnosis not present

## 2020-09-23 DIAGNOSIS — Z20822 Contact with and (suspected) exposure to covid-19: Secondary | ICD-10-CM | POA: Diagnosis not present

## 2020-09-23 DIAGNOSIS — E1021 Type 1 diabetes mellitus with diabetic nephropathy: Secondary | ICD-10-CM | POA: Diagnosis not present

## 2020-09-23 DIAGNOSIS — B9620 Unspecified Escherichia coli [E. coli] as the cause of diseases classified elsewhere: Secondary | ICD-10-CM | POA: Diagnosis not present

## 2020-09-23 DIAGNOSIS — E7800 Pure hypercholesterolemia, unspecified: Secondary | ICD-10-CM | POA: Diagnosis not present

## 2020-09-23 DIAGNOSIS — E1042 Type 1 diabetes mellitus with diabetic polyneuropathy: Secondary | ICD-10-CM | POA: Diagnosis not present

## 2020-09-23 DIAGNOSIS — E10610 Type 1 diabetes mellitus with diabetic neuropathic arthropathy: Secondary | ICD-10-CM | POA: Diagnosis not present

## 2020-09-23 DIAGNOSIS — Z6834 Body mass index (BMI) 34.0-34.9, adult: Secondary | ICD-10-CM | POA: Diagnosis not present

## 2020-09-23 DIAGNOSIS — R8271 Bacteriuria: Secondary | ICD-10-CM | POA: Diagnosis not present

## 2020-09-23 DIAGNOSIS — R112 Nausea with vomiting, unspecified: Secondary | ICD-10-CM | POA: Diagnosis not present

## 2020-09-23 DIAGNOSIS — Z794 Long term (current) use of insulin: Secondary | ICD-10-CM | POA: Diagnosis not present

## 2020-09-23 DIAGNOSIS — E1065 Type 1 diabetes mellitus with hyperglycemia: Secondary | ICD-10-CM | POA: Diagnosis not present

## 2020-09-23 DIAGNOSIS — N184 Chronic kidney disease, stage 4 (severe): Secondary | ICD-10-CM | POA: Diagnosis not present

## 2020-09-23 DIAGNOSIS — F419 Anxiety disorder, unspecified: Secondary | ICD-10-CM | POA: Diagnosis not present

## 2020-09-23 DIAGNOSIS — N179 Acute kidney failure, unspecified: Secondary | ICD-10-CM | POA: Diagnosis not present

## 2020-09-23 DIAGNOSIS — I129 Hypertensive chronic kidney disease with stage 1 through stage 4 chronic kidney disease, or unspecified chronic kidney disease: Secondary | ICD-10-CM | POA: Diagnosis not present

## 2020-09-23 DIAGNOSIS — E1022 Type 1 diabetes mellitus with diabetic chronic kidney disease: Secondary | ICD-10-CM | POA: Diagnosis not present

## 2020-09-23 DIAGNOSIS — E861 Hypovolemia: Secondary | ICD-10-CM | POA: Diagnosis not present

## 2020-09-27 ENCOUNTER — Other Ambulatory Visit: Payer: Self-pay | Admitting: Internal Medicine

## 2020-09-27 DIAGNOSIS — E782 Mixed hyperlipidemia: Secondary | ICD-10-CM

## 2020-09-30 DIAGNOSIS — Z87891 Personal history of nicotine dependence: Secondary | ICD-10-CM | POA: Diagnosis not present

## 2020-09-30 DIAGNOSIS — N179 Acute kidney failure, unspecified: Secondary | ICD-10-CM | POA: Diagnosis not present

## 2020-09-30 DIAGNOSIS — E1022 Type 1 diabetes mellitus with diabetic chronic kidney disease: Secondary | ICD-10-CM | POA: Diagnosis not present

## 2020-09-30 DIAGNOSIS — R140 Abdominal distension (gaseous): Secondary | ICD-10-CM | POA: Diagnosis not present

## 2020-09-30 DIAGNOSIS — Z20822 Contact with and (suspected) exposure to covid-19: Secondary | ICD-10-CM | POA: Diagnosis not present

## 2020-09-30 DIAGNOSIS — R1013 Epigastric pain: Secondary | ICD-10-CM | POA: Diagnosis not present

## 2020-09-30 DIAGNOSIS — R112 Nausea with vomiting, unspecified: Secondary | ICD-10-CM | POA: Diagnosis not present

## 2020-09-30 DIAGNOSIS — E7800 Pure hypercholesterolemia, unspecified: Secondary | ICD-10-CM | POA: Diagnosis not present

## 2020-09-30 DIAGNOSIS — Z794 Long term (current) use of insulin: Secondary | ICD-10-CM | POA: Diagnosis not present

## 2020-09-30 DIAGNOSIS — E785 Hyperlipidemia, unspecified: Secondary | ICD-10-CM | POA: Diagnosis not present

## 2020-09-30 DIAGNOSIS — R070 Pain in throat: Secondary | ICD-10-CM | POA: Diagnosis not present

## 2020-09-30 DIAGNOSIS — I129 Hypertensive chronic kidney disease with stage 1 through stage 4 chronic kidney disease, or unspecified chronic kidney disease: Secondary | ICD-10-CM | POA: Diagnosis not present

## 2020-09-30 DIAGNOSIS — E559 Vitamin D deficiency, unspecified: Secondary | ICD-10-CM | POA: Diagnosis not present

## 2020-09-30 DIAGNOSIS — Z880 Allergy status to penicillin: Secondary | ICD-10-CM | POA: Diagnosis not present

## 2020-09-30 DIAGNOSIS — E876 Hypokalemia: Secondary | ICD-10-CM | POA: Diagnosis not present

## 2020-09-30 DIAGNOSIS — F32A Depression, unspecified: Secondary | ICD-10-CM | POA: Diagnosis not present

## 2020-09-30 DIAGNOSIS — N184 Chronic kidney disease, stage 4 (severe): Secondary | ICD-10-CM | POA: Diagnosis not present

## 2020-09-30 DIAGNOSIS — K5900 Constipation, unspecified: Secondary | ICD-10-CM | POA: Diagnosis not present

## 2020-10-01 DIAGNOSIS — I129 Hypertensive chronic kidney disease with stage 1 through stage 4 chronic kidney disease, or unspecified chronic kidney disease: Secondary | ICD-10-CM | POA: Diagnosis not present

## 2020-10-01 DIAGNOSIS — R1013 Epigastric pain: Secondary | ICD-10-CM | POA: Diagnosis not present

## 2020-10-01 DIAGNOSIS — E1022 Type 1 diabetes mellitus with diabetic chronic kidney disease: Secondary | ICD-10-CM | POA: Diagnosis not present

## 2020-10-01 DIAGNOSIS — R112 Nausea with vomiting, unspecified: Secondary | ICD-10-CM | POA: Diagnosis not present

## 2020-10-02 DIAGNOSIS — R112 Nausea with vomiting, unspecified: Secondary | ICD-10-CM | POA: Diagnosis not present

## 2020-10-02 DIAGNOSIS — I10 Essential (primary) hypertension: Secondary | ICD-10-CM | POA: Diagnosis not present

## 2020-10-02 DIAGNOSIS — Z90710 Acquired absence of both cervix and uterus: Secondary | ICD-10-CM | POA: Insufficient documentation

## 2020-10-02 DIAGNOSIS — R1013 Epigastric pain: Secondary | ICD-10-CM | POA: Diagnosis not present

## 2020-10-02 DIAGNOSIS — E139 Other specified diabetes mellitus without complications: Secondary | ICD-10-CM | POA: Diagnosis not present

## 2020-10-11 NOTE — Progress Notes (Signed)
Date:  10/12/2020   Name:  Kelli [name]   DOB:  02-24-1961   MRN:  [id number]   Chief Complaint: Hospitalization Follow-up (Pt does not feel well, X1 day, nerve pain in left foot and leg, burning,on and off shooting pain, also nausea ) Hospital follow up from [facility] 2/6-10/02/20.  TOC attempted x 2 on 2/9 and 2/10.  Assessment & Plan:  Heavyn Medha [name] is a 60 y.o. 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  Principal Problem: Intractable vomiting Active Problems: Essential hypertension Depression Diabetes 1.5, managed as type 1 (CMS-HCC) Hyperlipidemia Chronic idiopathic constipation Hypokalemia CKD (chronic kidney disease) stage 4, GFR 15-29 ml/min (CMS-HCC) Resolved Problems: * No resolved hospital problems. *  Intractable Nausea and Vomiting, Epigastric Pain   Hx Chronic Idiopathic Constipation Two week history of nausea and vomiting up to 10-15 times a day with mild epigastric pain. Hospitalized 1/29-2/3 for the same issue, during which she had mild improvement of symptoms with aggressive bowel regimen but notes severe symptoms returned within 2 days of discharge. Has had similar hospitalizations intermittently for 5 years. Seems unlikely that current symptoms are due to constipation alone given continued daily BMs and evidence of only mild stool burden on KUB. Given history of diabetes with complications may consider gastroparesis though 2 prior gastric emptying studies have been unremarkable (last in 2017) and symptoms seem more episodic in nature. Consideration may also be given to cyclic vomiting syndrome. Per chart review, these episodes have occurred episodically with minimal symptoms between episodes, though will need a more robust history from patient to determine if she meets Rome IV criteria. Less  concern for infectious etiology given chronicity and lack of fever or white count. Will plan today on trial of Reglan and symptomatic treatment. Given possible interaction between Reglan and lexapro, will decrease dose of lexapro to 5 mg. Will likely benefit from repeat gastric emptying study at some point, but poor candidate until emesis is better controlled. If no improvement seen with Reglan may more strongly consider [facility] with consideration of amitriptyline trial.  -Reglan 5 mg oral solution QID before meals -IV zofran PRN, first line -IV phergan second line PRN -Miralax BID -Senna BID -Pantoprazole -Reduce Lexapro dose 10 mg->5 mg -EKG again tomorrow given QTc concerns >>> still having nausea since discharge.  Taking Reglan qid as above.  Still constipated despite the above therapy.  Vomiting about once a day.  She needs referral to [facility].  Severe Hypertension Systolic pressures in the 220s on admission. Blood pressure appears to be better controlled at home with values in the 140s/70s. Has not been able to keep down losartan at home recently. Treated with IV labetalol overnight with modest effect (down to 184/84). If she is able to take pills PO without emesis, will plan on increasing losartan dose today while considering IV labetalol again if not.  -Losartan 25 mg BID -Vitals q4H >>>Amlodipine 5 mg added to regimen and BP has improved.  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 Hemoglobin A1c 7.3 on 09/22/20. Complicated by Charcot neuropathy, retinopathy, and CKD. -Continue reduced home dose of lantus 15 units (from 25 at home) -sliding scale lispro >>> now having nerve pains in her leg - worsening neuropathy. She has not been able to tolerate gabapentin or Lyrica.  She has  not tried cymbalta.  Her pain comes and goes.  She has plans to see a specialist next week.  Depression Patient reports ongoing trouble sleeping and notably flat affect on interview. Planning  to reduce lexapro to 5 mg given possible interaction with reglan for short term. May consider different SSRI should long term reglan therapy be indicated. -Reduce lexapro 10 mg->5 mg as above -Continue Trazadone 50 mg >>> she likes the lower dose lexapro.  Sleeping better.  Chronic Conditions: Vitamin D deficiency: Hold cholecalciferol for now, restart when able to tolerate (hadn't been taking this week at home). Chronic Kidney Disease: Baseline creatinine appears to be 2.1-2.6. Likely secondary to diabetes and hypertension. Currently at baseline.  >>> She is not taking her statin due to recurrent leg pain when taking statin medication.  HPI  Lab Results  Component Value Date   CREATININE 2.28 (H) 05/30/2020   BUN 49 (H) 05/30/2020   NA 138 05/30/2020   K 4.3 05/30/2020   CL 108 05/30/2020   CO2 23 05/30/2020   Lab Results  Component Value Date   CHOL 226 (H) 12/08/2019   HDL 45 12/08/2019   LDLCALC 132 (H) 12/08/2019   TRIG 245 (H) 12/08/2019   CHOLHDL 5.0 12/08/2019   Lab Results  Component Value Date   TSH 1.794 09/02/2018   Lab Results  Component Value Date   HGBA1C 7.3 09/22/2020   Lab Results  Component Value Date   WBC 7.0 09/02/2018   HGB 11.7 (L) 09/02/2018   HCT 36.3 09/02/2018   MCV 87.1 09/02/2018   PLT 347 09/02/2018   Lab Results  Component Value Date   ALT 15 05/30/2020   AST 19 05/30/2020   ALKPHOS 67 05/30/2020   BILITOT 0.5 05/30/2020     Review of Systems  Constitutional: Positive for fatigue. Negative for chills, diaphoresis and fever.  HENT: Negative for trouble swallowing.   Eyes: Negative for visual disturbance.  Respiratory: Positive for cough and shortness of breath. Negative for chest tightness and wheezing.   Cardiovascular: Positive for chest pain. Negative for palpitations and leg swelling.  Gastrointestinal: Positive for constipation, nausea and vomiting. Negative for abdominal pain.  Genitourinary: Negative for dysuria.   Musculoskeletal: Positive for myalgias.  Neurological: Positive for dizziness and headaches.  Psychiatric/Behavioral: Positive for dysphoric mood and sleep disturbance. The patient is nervous/anxious.     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, left eye, 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   Neovascular glaucoma of left eye, 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   Mixed hyperlipidemia 06/30/2019   Hypomagnesemia 06/30/2019   Microalbuminuria 09/24/2018   Neuropathy of left foot 03/18/2017   Arthrosis of midfoot, left 03/18/2017   Charcot foot due to diabetes mellitus ([place]) 02/09/2017   Venous insufficiency of both lower extremities 02/09/2017   Chronic idiopathic constipation 01/26/2017   Intractable vomiting with nausea 01/26/2017   Abnormal thyroid blood test 09/30/2016   Essential hypertension 08/21/2016   Adrenal mass ([place]) 08/18/2016   Temporomandibular joint pain 11/02/2014   Moderate episode of recurrent major depressive disorder ([place]) 11/02/2014   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[place]) 06/28/2014   Eustachian tube dysfunction 05/10/2014   Hyperlipidemia 10/03/2013   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   Vitamin D deficiency 12/30/2012   Corns and callosity 07/09/2012   Hammer toe, acquired 07/09/2012  Allergies  Allergen Reactions   Other Nausea And Vomiting   Statins Nausea And Vomiting   Gabapentin Swelling   Hydrochlorothiazide Nausea And Vomiting   Lisinopril Other (See Comments)    fatigue   Lyrica [Pregabalin] Swelling   Metformin And Related Nausea And Vomiting   Penicillins Nausea And Vomiting    Past Surgical History:  Procedure Laterality Date   ABDOMINAL HYSTERECTOMY     COLONOSCOPY W/ POLYPECTOMY      ESOPHAGOGASTRODUODENOSCOPY  03/02/2017   Normal   HEMORRHOID SURGERY      Social History   Tobacco Use   Smoking status: Former Smoker   Smokeless tobacco: Never Used  Scientific laboratory technician Use: Never used  Substance Use Topics   Alcohol use: No   Drug use: No     Medication list has been reviewed and updated.  Current Meds  Medication Sig   B-D ULTRAFINE III SHORT PEN 31G X 8 MM MISC Inject into the skin.   Continuous Blood Gluc Receiver (FREESTYLE LIBRE 14 DAY READER) DEVI See admin instructions.   Continuous Blood Gluc Sensor (FREESTYLE LIBRE 14 DAY SENSOR) MISC USE AS DIRECTED EVERY 14 DAYS   escitalopram (LEXAPRO) 10 MG tablet Take 1 tablet (10 mg total) by mouth daily.   glucose blood (FREESTYLE LITE) test strip 1 each by Other route five (5) times a day.   insulin glargine (LANTUS) 100 UNIT/ML Solostar Pen Inject into the skin.   insulin lispro (HUMALOG) 100 UNIT/ML KiwkPen 15 units with meal - on sliding scale.   losartan (COZAAR) 25 MG tablet TAKE 1/2 TABLET BY MOUTH EVERY MORNING   metoCLOPramide (REGLAN) 10 MG/10ML SOLN Take by mouth.   mupirocin ointment (BACTROBAN) 2 % Apply 1 application topically 2 (two) times daily.   polyethylene glycol powder (GLYCOLAX/MIRALAX) 17 GM/SCOOP powder Take by mouth.   senna (SENOKOT) 8.6 MG tablet Take by mouth.    PHQ 2/9 Scores 10/12/2020 07/30/2020 05/30/2020 12/08/2019  PHQ - 2 Score 4 0 6 6  PHQ- 9 Score 14 0 24 15    GAD 7 : Generalized Anxiety Score 10/12/2020 07/30/2020 05/30/2020 12/08/2019  Nervous, Anxious, on Edge 2 0 3 3  Control/stop worrying 2 0 3 3  Worry too much - different things 2 0 3 3  Trouble relaxing 2 0 2 3  Restless 0 0 2 2  Easily annoyed or irritable 0 0 0 0  Afraid - awful might happen 0 0 0 0  Total GAD 7 Score 8 0 13 14  Anxiety Difficulty - - Somewhat difficult Somewhat difficult    BP Readings from Last 3 Encounters:  10/12/20 126/66  07/30/20 124/70  05/30/20 118/72     Physical Exam Vitals and nursing note reviewed.  Constitutional:      General: She is not in acute distress.    Appearance: She is well-developed. She is ill-appearing.  HENT:     Head: Normocephalic and atraumatic.  Cardiovascular:     Rate and Rhythm: Normal rate and regular rhythm.     Pulses: Normal pulses.  Pulmonary:     Effort: Pulmonary effort is normal. No respiratory distress.     Breath sounds: No wheezing or rhonchi.  Musculoskeletal:        General: Tenderness (left foot and ankle) present.     Cervical back: Normal range of motion.     Right lower leg: No edema.     Left lower leg: No edema.  Lymphadenopathy:  Cervical: No cervical adenopathy.  Skin:    General: Skin is warm and dry.     Findings: No rash.  Neurological:     Mental Status: She is alert and oriented to person, place, and time.  Psychiatric:        Mood and Affect: Mood is depressed.        Speech: Speech normal.        Behavior: Behavior normal.     Wt Readings from Last 3 Encounters:  10/12/20 183 lb (83 kg)  07/30/20 189 lb (85.7 kg)  05/30/20 189 lb (85.7 kg)    BP 126/66    Pulse 86    Temp 98.4 F (36.9 C) (Oral)    Ht 5\' 7"  (1.702 m)    Wt 183 lb (83 kg)    SpO2 98%    BMI 28.66 kg/m   Assessment and Plan: 1. Intractable vomiting with nausea Continue PPI and Carafate - Ambulatory referral to Gastroenterology  2. 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HCC) Continue insulin therapy and Endo follow up.  3. Neuropathy of left foot Fluctuating pain; intolerant of Lyrica and gabapentin  4. Moderate episode of recurrent major depressive disorder (HCC) Now on lower dose of Lexapro and doing well. - escitalopram (LEXAPRO) 5 MG tablet; Take 1 tablet (5 mg total) by mouth daily.  Dispense: 90 tablet; Refill: 1  5. Essential hypertension Clinically stable exam with well controlled BP. Tolerating medications without side effects at this time. Pt to continue current  regimen and low sodium diet; benefits of regular exercise as able discussed.  6. Myalgia due to statin Remain off statin for now   Partially dictated using Editor, commissioning. Any errors are unintentional.  Halina Maidens, MD [facility]  10/12/2020

## 2020-10-12 ENCOUNTER — Encounter: Payer: Self-pay | Admitting: Internal Medicine

## 2020-10-12 ENCOUNTER — Ambulatory Visit (INDEPENDENT_AMBULATORY_CARE_PROVIDER_SITE_OTHER): Payer: BC Managed Care – PPO | Admitting: Internal Medicine

## 2020-10-12 ENCOUNTER — Other Ambulatory Visit: Payer: Self-pay

## 2020-10-12 VITALS — BP 126/66 | HR 86 | Temp 98.4°F | Ht 67.0 in | Wt 183.0 lb

## 2020-10-12 DIAGNOSIS — F331 Major depressive disorder, recurrent, moderate: Secondary | ICD-10-CM | POA: Diagnosis not present

## 2020-10-12 DIAGNOSIS — E785 Hyperlipidemia, unspecified: Secondary | ICD-10-CM | POA: Diagnosis not present

## 2020-10-12 DIAGNOSIS — N184 Chronic kidney disease, stage 4 (severe): Secondary | ICD-10-CM | POA: Diagnosis not present

## 2020-10-12 DIAGNOSIS — I10 Essential (primary) hypertension: Secondary | ICD-10-CM

## 2020-10-12 DIAGNOSIS — G5792 Unspecified mononeuropathy of left lower limb: Secondary | ICD-10-CM | POA: Diagnosis not present

## 2020-10-12 DIAGNOSIS — R112 Nausea with vomiting, unspecified: Secondary | ICD-10-CM | POA: Diagnosis not present

## 2020-10-12 DIAGNOSIS — Z1389 Encounter for screening for other disorder: Secondary | ICD-10-CM | POA: Diagnosis not present

## 2020-10-12 DIAGNOSIS — T466X5A Adverse effect of antihyperlipidemic and antiarteriosclerotic drugs, initial encounter: Secondary | ICD-10-CM

## 2020-10-12 DIAGNOSIS — E1042 Type 1 diabetes mellitus with diabetic polyneuropathy: Secondary | ICD-10-CM

## 2020-10-12 DIAGNOSIS — F339 Major depressive disorder, recurrent, unspecified: Secondary | ICD-10-CM | POA: Diagnosis not present

## 2020-10-12 DIAGNOSIS — E109 Type 1 diabetes mellitus without complications: Secondary | ICD-10-CM | POA: Diagnosis not present

## 2020-10-12 DIAGNOSIS — M791 Myalgia, unspecified site: Secondary | ICD-10-CM | POA: Insufficient documentation

## 2020-10-12 MED ORDER — ESCITALOPRAM OXALATE 5 MG PO TABS: 5.0000 mg | ORAL_TABLET | Freq: Every day | ORAL | 1 refills | Status: DC

## 2020-10-16 DIAGNOSIS — E113513 Type 2 diabetes mellitus with proliferative diabetic retinopathy with macular edema, bilateral: Secondary | ICD-10-CM | POA: Diagnosis not present

## 2020-10-16 DIAGNOSIS — H4089 Other specified glaucoma: Secondary | ICD-10-CM | POA: Diagnosis not present

## 2020-10-22 ENCOUNTER — Telehealth: Payer: Self-pay

## 2020-10-22 NOTE — Telephone Encounter (Unsigned)
Copied from [place] [id number]. Topic: General - Other >> Oct 22, 2020 11:00 AM Celene Kras wrote: Reason for CRM: Pt called stating that she was referred to GI specialist. She states that scheduled an appt, but they did not have anything until 12/03/20. She states that she has been in the hospital 2x for vomiting and constipation. She states that it is starting again and she does not believe she can wait until that appt. She is requesting to see if there is something that can be done so that she can be seen sooner. Please advise.

## 2020-10-22 NOTE — Telephone Encounter (Signed)
Called patient and informed her unfortunately there is nothing we can do to get her in sooner. They go off there schedule at the speciality clinic and if that is there first available then there is nothing we can do further to get her in sooner.  She verbalized understanding.

## 2020-10-24 DIAGNOSIS — E7800 Pure hypercholesterolemia, unspecified: Secondary | ICD-10-CM | POA: Diagnosis not present

## 2020-10-24 DIAGNOSIS — E785 Hyperlipidemia, unspecified: Secondary | ICD-10-CM | POA: Diagnosis not present

## 2020-10-24 DIAGNOSIS — E876 Hypokalemia: Secondary | ICD-10-CM | POA: Diagnosis not present

## 2020-10-24 DIAGNOSIS — R197 Diarrhea, unspecified: Secondary | ICD-10-CM | POA: Diagnosis not present

## 2020-10-24 DIAGNOSIS — K5904 Chronic idiopathic constipation: Secondary | ICD-10-CM | POA: Diagnosis not present

## 2020-10-24 DIAGNOSIS — K649 Unspecified hemorrhoids: Secondary | ICD-10-CM | POA: Diagnosis not present

## 2020-10-24 DIAGNOSIS — N184 Chronic kidney disease, stage 4 (severe): Secondary | ICD-10-CM | POA: Diagnosis not present

## 2020-10-24 DIAGNOSIS — Z20822 Contact with and (suspected) exposure to covid-19: Secondary | ICD-10-CM | POA: Diagnosis not present

## 2020-10-24 DIAGNOSIS — R8271 Bacteriuria: Secondary | ICD-10-CM | POA: Diagnosis not present

## 2020-10-24 DIAGNOSIS — E1022 Type 1 diabetes mellitus with diabetic chronic kidney disease: Secondary | ICD-10-CM | POA: Diagnosis not present

## 2020-10-24 DIAGNOSIS — I10 Essential (primary) hypertension: Secondary | ICD-10-CM | POA: Diagnosis not present

## 2020-10-24 DIAGNOSIS — R531 Weakness: Secondary | ICD-10-CM | POA: Diagnosis not present

## 2020-10-24 DIAGNOSIS — D3500 Benign neoplasm of unspecified adrenal gland: Secondary | ICD-10-CM | POA: Diagnosis not present

## 2020-10-24 DIAGNOSIS — F32A Depression, unspecified: Secondary | ICD-10-CM | POA: Diagnosis not present

## 2020-10-24 DIAGNOSIS — R112 Nausea with vomiting, unspecified: Secondary | ICD-10-CM | POA: Diagnosis not present

## 2020-10-24 DIAGNOSIS — I129 Hypertensive chronic kidney disease with stage 1 through stage 4 chronic kidney disease, or unspecified chronic kidney disease: Secondary | ICD-10-CM | POA: Diagnosis not present

## 2020-10-24 DIAGNOSIS — R5383 Other fatigue: Secondary | ICD-10-CM | POA: Diagnosis not present

## 2020-10-24 DIAGNOSIS — B9620 Unspecified Escherichia coli [E. coli] as the cause of diseases classified elsewhere: Secondary | ICD-10-CM | POA: Diagnosis not present

## 2020-10-24 DIAGNOSIS — E1040 Type 1 diabetes mellitus with diabetic neuropathy, unspecified: Secondary | ICD-10-CM | POA: Diagnosis not present

## 2020-10-25 DIAGNOSIS — K5904 Chronic idiopathic constipation: Secondary | ICD-10-CM | POA: Diagnosis not present

## 2020-10-25 DIAGNOSIS — R8271 Bacteriuria: Secondary | ICD-10-CM | POA: Diagnosis not present

## 2020-10-25 DIAGNOSIS — R109 Unspecified abdominal pain: Secondary | ICD-10-CM | POA: Diagnosis not present

## 2020-10-25 DIAGNOSIS — R112 Nausea with vomiting, unspecified: Secondary | ICD-10-CM | POA: Diagnosis not present

## 2020-10-25 DIAGNOSIS — I10 Essential (primary) hypertension: Secondary | ICD-10-CM | POA: Diagnosis not present

## 2020-10-30 DIAGNOSIS — E13610 Other specified diabetes mellitus with diabetic neuropathic arthropathy: Secondary | ICD-10-CM | POA: Diagnosis not present

## 2020-10-30 DIAGNOSIS — E876 Hypokalemia: Secondary | ICD-10-CM | POA: Diagnosis not present

## 2020-10-30 DIAGNOSIS — R1013 Epigastric pain: Secondary | ICD-10-CM | POA: Diagnosis not present

## 2020-10-30 DIAGNOSIS — R112 Nausea with vomiting, unspecified: Secondary | ICD-10-CM | POA: Diagnosis not present

## 2020-10-30 DIAGNOSIS — K30 Functional dyspepsia: Secondary | ICD-10-CM | POA: Diagnosis not present

## 2020-10-30 DIAGNOSIS — D3500 Benign neoplasm of unspecified adrenal gland: Secondary | ICD-10-CM | POA: Diagnosis not present

## 2020-10-30 DIAGNOSIS — K5904 Chronic idiopathic constipation: Secondary | ICD-10-CM | POA: Diagnosis not present

## 2020-10-30 DIAGNOSIS — R000 Tachycardia, unspecified: Secondary | ICD-10-CM | POA: Diagnosis not present

## 2020-10-30 DIAGNOSIS — I129 Hypertensive chronic kidney disease with stage 1 through stage 4 chronic kidney disease, or unspecified chronic kidney disease: Secondary | ICD-10-CM | POA: Diagnosis not present

## 2020-10-30 DIAGNOSIS — E1365 Other specified diabetes mellitus with hyperglycemia: Secondary | ICD-10-CM | POA: Diagnosis not present

## 2020-10-30 DIAGNOSIS — R8271 Bacteriuria: Secondary | ICD-10-CM | POA: Diagnosis not present

## 2020-10-30 DIAGNOSIS — N184 Chronic kidney disease, stage 4 (severe): Secondary | ICD-10-CM | POA: Diagnosis not present

## 2020-10-30 DIAGNOSIS — E7800 Pure hypercholesterolemia, unspecified: Secondary | ICD-10-CM | POA: Diagnosis not present

## 2020-10-30 DIAGNOSIS — E1322 Other specified diabetes mellitus with diabetic chronic kidney disease: Secondary | ICD-10-CM | POA: Diagnosis not present

## 2020-10-30 DIAGNOSIS — G43909 Migraine, unspecified, not intractable, without status migrainosus: Secondary | ICD-10-CM | POA: Diagnosis not present

## 2020-10-30 DIAGNOSIS — K5900 Constipation, unspecified: Secondary | ICD-10-CM | POA: Diagnosis not present

## 2020-10-30 DIAGNOSIS — E1340 Other specified diabetes mellitus with diabetic neuropathy, unspecified: Secondary | ICD-10-CM | POA: Diagnosis not present

## 2020-10-30 DIAGNOSIS — Z6827 Body mass index (BMI) 27.0-27.9, adult: Secondary | ICD-10-CM | POA: Diagnosis not present

## 2020-10-30 DIAGNOSIS — Z20822 Contact with and (suspected) exposure to covid-19: Secondary | ICD-10-CM | POA: Diagnosis not present

## 2020-10-30 DIAGNOSIS — R8281 Pyuria: Secondary | ICD-10-CM | POA: Diagnosis not present

## 2020-10-30 DIAGNOSIS — F418 Other specified anxiety disorders: Secondary | ICD-10-CM | POA: Diagnosis not present

## 2020-10-30 DIAGNOSIS — I10 Essential (primary) hypertension: Secondary | ICD-10-CM | POA: Diagnosis not present

## 2020-11-05 DIAGNOSIS — N184 Chronic kidney disease, stage 4 (severe): Secondary | ICD-10-CM | POA: Diagnosis not present

## 2020-11-05 DIAGNOSIS — E1022 Type 1 diabetes mellitus with diabetic chronic kidney disease: Secondary | ICD-10-CM | POA: Diagnosis not present

## 2020-11-05 DIAGNOSIS — R8290 Unspecified abnormal findings in urine: Secondary | ICD-10-CM | POA: Diagnosis not present

## 2020-11-05 DIAGNOSIS — R809 Proteinuria, unspecified: Secondary | ICD-10-CM | POA: Diagnosis not present

## 2020-11-05 DIAGNOSIS — I10 Essential (primary) hypertension: Secondary | ICD-10-CM | POA: Diagnosis not present

## 2020-11-06 DIAGNOSIS — E113511 Type 2 diabetes mellitus with proliferative diabetic retinopathy with macular edema, right eye: Secondary | ICD-10-CM | POA: Diagnosis not present

## 2020-11-06 DIAGNOSIS — E113513 Type 2 diabetes mellitus with proliferative diabetic retinopathy with macular edema, bilateral: Secondary | ICD-10-CM | POA: Diagnosis not present

## 2020-11-09 ENCOUNTER — Other Ambulatory Visit: Payer: Self-pay | Admitting: Internal Medicine

## 2020-11-09 ENCOUNTER — Ambulatory Visit (INDEPENDENT_AMBULATORY_CARE_PROVIDER_SITE_OTHER): Payer: BC Managed Care – PPO | Admitting: Internal Medicine

## 2020-11-09 ENCOUNTER — Other Ambulatory Visit: Payer: Self-pay

## 2020-11-09 ENCOUNTER — Encounter: Payer: Self-pay | Admitting: Internal Medicine

## 2020-11-09 VITALS — BP 132/74 | HR 85 | Temp 98.2°F | Ht 67.0 in | Wt 181.0 lb

## 2020-11-09 DIAGNOSIS — E1042 Type 1 diabetes mellitus with diabetic polyneuropathy: Secondary | ICD-10-CM

## 2020-11-09 DIAGNOSIS — H43391 Other vitreous opacities, right eye: Secondary | ICD-10-CM | POA: Diagnosis not present

## 2020-11-09 DIAGNOSIS — K5904 Chronic idiopathic constipation: Secondary | ICD-10-CM

## 2020-11-09 DIAGNOSIS — Z961 Presence of intraocular lens: Secondary | ICD-10-CM | POA: Diagnosis not present

## 2020-11-09 DIAGNOSIS — I10 Essential (primary) hypertension: Secondary | ICD-10-CM

## 2020-11-09 DIAGNOSIS — E1139 Type 2 diabetes mellitus with other diabetic ophthalmic complication: Secondary | ICD-10-CM | POA: Diagnosis not present

## 2020-11-09 DIAGNOSIS — R112 Nausea with vomiting, unspecified: Secondary | ICD-10-CM | POA: Diagnosis not present

## 2020-11-09 DIAGNOSIS — H4089 Other specified glaucoma: Secondary | ICD-10-CM | POA: Diagnosis not present

## 2020-11-09 DIAGNOSIS — N3000 Acute cystitis without hematuria: Secondary | ICD-10-CM

## 2020-11-09 DIAGNOSIS — N185 Chronic kidney disease, stage 5: Secondary | ICD-10-CM

## 2020-11-09 NOTE — Progress Notes (Signed)
Date:  11/09/2020   Name:  Kelli [name]   DOB:  07/27/1961   MRN:  [id number]   Chief Complaint: Hospitalization Follow-up Hospital follow up and medication review. Admitted to [facility] 10/30/20 to 11/02/20 with intractable nausea and vomiting.  Medications were adjusted, her UTI was treated and she is doing much better.  She was seen by Nephrology 4 days ago - K = 3.5.  GFR=12.   Hospital Course:  Intractable nausea and vomiting  Epigastric pain Hx ofchronic idiopathic constipation Presents with 2 days of intractable nausea and vomiting, poor p.o. intake, decreased appetite, and reported worsening crampy epigastric pain. Last bowel movement 2 days prior to presentation, prior episodes resolved with treatment of constipation. Lipase is normal and electrolytes within normal limits. Prior GI work up has been negative. GI consultants recommendations listed below. Considering that gastric dysmotility is contributing to patient's chronic nausea/vomiting with some component of known idiopathic constipation. AM Cortisol elevated lowering adrenal insufficiency of etiology. TSH normal. Patient does have labile BP raising concern for pheochromocytoma, plasma metanephrines pending. Symptoms are improving but overall no clear diagnosis. Suspicion for functional disorder such as cyclic vomiting syndrome, notably no cannabis use so low concern for hyperemesis syndrome. Patient transitioned to PO medications and was feeling much improved on 11/02/2020. She was tolerating PO appropriately.   HTN BP has remained elevated throughout admission to 053Z systolic over low 767H diastolic. Home baseline is 419 systolic, but reaches >379 when vomiting and unable to take home meds.No evidence of acute end-organ damage.Most likely multifactorial due to inability to take home anti-HTN's and advanced CKD. Adjusting home regimen as below and investigating secondary causes including pheochromocytoma as above. Changes made as  below with good response in BP's.  -increase home losartan to 100mg  --continue chlorthalidone 12.5mg  daily (new med) --start Coreg 6.25 BID (new med) -discontinued amlodipine as above -Renal function panel, mag daily  Asymptomatic bacteriuria UA notable for pyruria, but confounded by numerous squamous cells. Urine culture grew E. Coli. Will opt to treat given possibility of contribution to nausea and vomiting.  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 Hyperglycemic to 200-300s despite poor PO intake. Advancing diet today and adjusting regimen as below. -Lantus14U -SSI  Hypokalemia Potassium to 2.7, receiving IV repletion.  - daily BMP - repleting PRN  Chronicmedical conditions: Constipation: Having bowel movements inpatient. Continue home bowel regimen as above. CKD 4:Cr stable nearbaseline. Monitoring UOP, daily BMP. Anxiety/Depression: continue home escitalopram Hx AoCKD:Hb WNL. No acute indications for Epogen. Notable for Jehovah's witness status.    Outpatient Provider Follow Up Issues:  [ ]  needs reassessment of BP meds; recently stopped her amlodipine (due to concerns it could be contributing to gastroparesis) and started her on coreg and increased her losartan to 100mg  daily. [ ]  ensure daily bowel movements and uptitrate bowel regimen as needed [ ]  continue management of glucoses; no changes were made during this hospitalization [ ]  ensure compliance to reglan as this medication is very important to control her motility.  [ ]  f/u her pheochromocytoma work up and consider working up for other causes of secondary hypertension if BP still out of range on >3 BP meds.    Normetanephrine, Free <0.90 nmol/L 0.4    Metanephrine, Free <0.50 nmol/L <0.20     Cortisol See Comment ug/dL 57.7      Hypertension This is a chronic problem.  Condition status: much improved.  Pertinent negatives include no chest pain, headaches or palpitations. Past treatments include beta blockers, angiotensin blockers and diuretics. The current treatment provides significant improvement. Hypertensive end-organ damage includes kidney disease.  Urinary Tract Infection  This is a new problem. The current episode started 1 to 4 weeks ago. The problem has been resolved (treated in [facility] with Monurol.). There is no history of pyelonephritis. Pertinent negatives include no chills, hematuria or vomiting. Associated symptoms comments: Repeat Urine culture by Nephrology was negative/no growth.  Constipation This is a chronic problem. Her stool frequency is 1 time per day. The stool is described as formed. Pertinent negatives include no abdominal pain, difficulty urinating, fever or vomiting. Treatments tried: on miralax and senekot.  Nausea/vomiting - now doing well on Reglan with each meal.  Will have follow up with [facility] in April. CKD stage 4 - now being referred to [facility] Transplant service.  Husband is willing to be a donor if he is a Orthoptist.   Lab Results  Component Value Date   CREATININE 2.28 (H) 05/30/2020   BUN 49 (H) 05/30/2020   NA 138 05/30/2020   K 4.3 05/30/2020   CL 108 05/30/2020   CO2 23 05/30/2020   Lab Results  Component Value Date   CHOL 226 (H) 12/08/2019   HDL 45 12/08/2019   LDLCALC 132 (H) 12/08/2019   TRIG 245 (H) 12/08/2019   CHOLHDL 5.0 12/08/2019   Lab Results  Component Value Date   TSH 1.794 09/02/2018   Lab Results  Component Value Date   HGBA1C 7.3 09/22/2020   Lab Results  Component Value Date   WBC 7.0 09/02/2018   HGB 11.7 (L) 09/02/2018   HCT 36.3 09/02/2018   MCV 87.1 09/02/2018   PLT 347 09/02/2018   Lab Results  Component Value Date   ALT 15 05/30/2020   AST 19 05/30/2020   ALKPHOS 67 05/30/2020   BILITOT 0.5 05/30/2020     Review of Systems  Constitutional: Negative for chills, diaphoresis, fatigue and fever.  Respiratory:  Negative for cough and chest tightness.   Cardiovascular: Negative for chest pain, palpitations and leg swelling.  Gastrointestinal: Positive for constipation. Negative for abdominal distention, abdominal pain, blood in stool and vomiting.  Genitourinary: Negative for difficulty urinating, dysuria and hematuria.  Neurological: Negative for dizziness, light-headedness and headaches.  Psychiatric/Behavioral: Negative for dysphoric mood and sleep disturbance. The patient is not nervous/anxious.     Patient Active Problem List   Diagnosis Date Noted  . Myalgia due to statin 10/12/2020  . Status post hysterectomy 10/02/2020  . Stage 3b chronic kidney disease ([place]) 05/30/2020  . Psychophysiological insomnia 05/30/2020  . 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 Mixed hyperlipidemia 06/30/2019  . Hypomagnesemia 06/30/2019  . Microalbuminuria 09/24/2018  . Neuropathy of left foot 03/18/2017  . 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 Charcot foot due to diabetes mellitus ([place]) 02/09/2017  . Venous insufficiency of both lower extremities 02/09/2017  . Chronic idiopathic constipation 01/26/2017  . Intractable vomiting with nausea 01/26/2017  . Abnormal thyroid blood test 09/30/2016  . Essential hypertension 08/21/2016  . Adrenal mass ([place]) 08/18/2016  . Temporomandibular joint pain 11/02/2014  . Moderate episode of recurrent major depressive disorder ([place]) 11/02/2014  . 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[place]) 06/28/2014  . Eustachian tube dysfunction 05/10/2014  . Hyperlipidemia 10/03/2013  . 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 Vitamin D deficiency 12/30/2012  . Corns and callosity 07/09/2012  . Hammer toe, acquired 07/09/2012    Allergies  Allergen Reactions  . Other Nausea And Vomiting  . Statins Nausea And Vomiting  . Gabapentin Swelling  .  Hydrochlorothiazide Nausea And Vomiting  . Lisinopril Other (See Comments)    fatigue  . Lyrica [Pregabalin] Swelling  . Metformin And Related Nausea And Vomiting  . Penicillins Nausea And Vomiting    Past Surgical History:  Procedure Laterality Date  . ABDOMINAL HYSTERECTOMY    . COLONOSCOPY W/ POLYPECTOMY    . ESOPHAGOGASTRODUODENOSCOPY  03/02/2017   Normal  . HEMORRHOID SURGERY      Social History   Tobacco Use  . Smoking status: Former Research scientist (life sciences)  . Smokeless tobacco: Never Used  Vaping Use  . Vaping Use: Never used  Substance Use Topics  . Alcohol use: No  . Drug use: No     Medication list has been reviewed and updated.  Current Meds  Medication Sig  . BD PEN NEEDLE NANO U/F 32G X 4 MM MISC Inject 1 each into the skin 3 (three) times daily.  . carvedilol (COREG) 6.25 MG tablet Take 6.25 mg by mouth 2 (two) times daily.  . chlorthalidone (HYGROTON) 25 MG tablet Take 12.5 mg by mouth daily.  . Continuous Blood Gluc Receiver (FREESTYLE LIBRE 14 DAY READER) DEVI See admin instructions.  . Continuous Blood Gluc Sensor (FREESTYLE LIBRE 14 DAY SENSOR) MISC USE AS DIRECTED EVERY 14 DAYS  . escitalopram (LEXAPRO) 10 MG tablet Take 10 mg by mouth daily.  Marland Kitchen glucose blood (FREESTYLE LITE) test strip 1 each by Other route five (5) times a day.  . insulin glargine (LANTUS) 100 UNIT/ML Solostar Pen Inject 15 Units into the skin daily.  Marland Kitchen losartan (COZAAR) 100 MG tablet Take 1 tablet by mouth daily at 6 (six) AM.  . metoCLOPramide (REGLAN) 5 MG tablet TAKE 1 TABLET (5 MG TOTAL) BY MOUTH THREE (3) TIMES A DAY BEFORE MEALS.  . mirtazapine (REMERON) 7.5 MG tablet TAKE 1 TABLET BY MOUTH EVERY DAY NIGHTLY  . mupirocin ointment (BACTROBAN) 2 % Apply 1 application topically 2 (two) times daily.  . pantoprazole (PROTONIX) 40 MG tablet Take 40 mg by mouth daily.  . polyethylene glycol powder (GLYCOLAX/MIRALAX) 17 GM/SCOOP powder Take by mouth.  . senna (SENOKOT) 8.6 MG tablet Take by mouth.     PHQ 2/9 Scores 11/09/2020 10/12/2020 07/30/2020 05/30/2020  PHQ - 2 Score 0 4 0 6  PHQ- 9 Score 0 14 0 24    GAD 7 : Generalized Anxiety Score 11/09/2020 10/12/2020 07/30/2020 05/30/2020  Nervous, Anxious, on Edge 0 2 0 3  Control/stop worrying 0 2 0 3  Worry too much - different things 0 2 0 3  Trouble relaxing 0 2 0 2  Restless 0 0 0 2  Easily annoyed or irritable 0 0 0 0  Afraid - awful might happen 0 0 0 0  Total GAD 7 Score 0 8 0 13  Anxiety Difficulty Not difficult at all - - Somewhat difficult    BP Readings from Last 3 Encounters:  11/09/20 132/74  10/12/20 126/66  07/30/20 124/70    Physical Exam Vitals and nursing note reviewed.  Constitutional:      General: She is not in acute distress.    Appearance: She is well-developed.  HENT:     Head: Normocephalic and atraumatic.  Cardiovascular:     Rate  and Rhythm: Normal rate and regular rhythm.     Pulses: Normal pulses.     Heart sounds: No murmur heard.   Pulmonary:     Effort: Pulmonary effort is normal. No respiratory distress.     Breath sounds: No wheezing or rhonchi.  Musculoskeletal:     Cervical back: Normal range of motion.     Right lower leg: No edema.     Left lower leg: No edema.  Skin:    General: Skin is warm and dry.     Findings: No rash.  Neurological:     General: No focal deficit present.     Mental Status: She is alert and oriented to person, place, and time.  Psychiatric:        Mood and Affect: Mood normal.        Behavior: Behavior normal.     Wt Readings from Last 3 Encounters:  11/09/20 181 lb (82.1 kg)  10/12/20 183 lb (83 kg)  07/30/20 189 lb (85.7 kg)    BP 132/74   Pulse 85   Temp 98.2 F (36.8 C) (Oral)   Ht 5\' 7"  (1.702 m)   Wt 181 lb (82.1 kg)   SpO2 95%   BMI 28.35 kg/m   Assessment and Plan: 1. Essential hypertension BP is improved with good control today. Continue triple therapy. Pheochromocytoma work up negative. Patient advised.  2. Chronic  idiopathic constipation Much improved with a daily bowel regimen. Pt encouraged to continue.  3. Intractable vomiting with nausea No more n/v since discharge.   Encouraged to continue Reglan tid  4. 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[place]) DM controlled on insulin injections Continue follow up with Endocrinology  5. Acute cystitis without hematuria E Coli on culture at [facility] - repeat culture showed no growth  6. CKD (chronic kidney disease) stage 5, GFR less than 15 ml/min (HCC) Recently seen by Nephrology. Awaiting an appointment with [facility] Transplant service   Partially dictated using Dragon software. Any errors are unintentional.  Halina Maidens, MD [facility]  11/09/2020

## 2020-11-19 DIAGNOSIS — N184 Chronic kidney disease, stage 4 (severe): Secondary | ICD-10-CM | POA: Diagnosis not present

## 2020-11-21 ENCOUNTER — Telehealth: Payer: Self-pay

## 2020-11-21 NOTE — Telephone Encounter (Signed)
Spoke to pt, gave info.

## 2020-11-21 NOTE — Telephone Encounter (Unsigned)
Copied from [place] [phone]. Topic: General - Other >> Nov 21, 2020 12:32 PM Celene Kras wrote: Reason for CRM: Pt called and is requesting to have a blood test to know what her blood type is so that she can receive a kidney transplant. Please advise.

## 2020-12-03 DIAGNOSIS — Z1212 Encounter for screening for malignant neoplasm of rectum: Secondary | ICD-10-CM | POA: Diagnosis not present

## 2020-12-03 DIAGNOSIS — Z6828 Body mass index (BMI) 28.0-28.9, adult: Secondary | ICD-10-CM | POA: Diagnosis not present

## 2020-12-03 DIAGNOSIS — R112 Nausea with vomiting, unspecified: Secondary | ICD-10-CM | POA: Diagnosis not present

## 2020-12-03 DIAGNOSIS — Z1211 Encounter for screening for malignant neoplasm of colon: Secondary | ICD-10-CM | POA: Diagnosis not present

## 2020-12-08 DIAGNOSIS — N184 Chronic kidney disease, stage 4 (severe): Secondary | ICD-10-CM | POA: Diagnosis not present

## 2020-12-08 DIAGNOSIS — F419 Anxiety disorder, unspecified: Secondary | ICD-10-CM | POA: Diagnosis not present

## 2020-12-08 DIAGNOSIS — Z6828 Body mass index (BMI) 28.0-28.9, adult: Secondary | ICD-10-CM | POA: Diagnosis not present

## 2020-12-08 DIAGNOSIS — R1013 Epigastric pain: Secondary | ICD-10-CM | POA: Diagnosis not present

## 2020-12-08 DIAGNOSIS — F32A Depression, unspecified: Secondary | ICD-10-CM | POA: Diagnosis not present

## 2020-12-08 DIAGNOSIS — E861 Hypovolemia: Secondary | ICD-10-CM | POA: Diagnosis not present

## 2020-12-08 DIAGNOSIS — E876 Hypokalemia: Secondary | ICD-10-CM | POA: Diagnosis not present

## 2020-12-08 DIAGNOSIS — E785 Hyperlipidemia, unspecified: Secondary | ICD-10-CM | POA: Diagnosis not present

## 2020-12-08 DIAGNOSIS — Z87891 Personal history of nicotine dependence: Secondary | ICD-10-CM | POA: Diagnosis not present

## 2020-12-08 DIAGNOSIS — E1122 Type 2 diabetes mellitus with diabetic chronic kidney disease: Secondary | ICD-10-CM | POA: Diagnosis not present

## 2020-12-08 DIAGNOSIS — N179 Acute kidney failure, unspecified: Secondary | ICD-10-CM | POA: Diagnosis not present

## 2020-12-08 DIAGNOSIS — I129 Hypertensive chronic kidney disease with stage 1 through stage 4 chronic kidney disease, or unspecified chronic kidney disease: Secondary | ICD-10-CM | POA: Diagnosis not present

## 2020-12-08 DIAGNOSIS — Z794 Long term (current) use of insulin: Secondary | ICD-10-CM | POA: Diagnosis not present

## 2020-12-08 DIAGNOSIS — E860 Dehydration: Secondary | ICD-10-CM | POA: Diagnosis not present

## 2020-12-08 DIAGNOSIS — R197 Diarrhea, unspecified: Secondary | ICD-10-CM | POA: Diagnosis not present

## 2020-12-08 DIAGNOSIS — E1342 Other specified diabetes mellitus with diabetic polyneuropathy: Secondary | ICD-10-CM | POA: Diagnosis not present

## 2020-12-08 DIAGNOSIS — E1322 Other specified diabetes mellitus with diabetic chronic kidney disease: Secondary | ICD-10-CM | POA: Diagnosis not present

## 2020-12-08 DIAGNOSIS — Z20822 Contact with and (suspected) exposure to covid-19: Secondary | ICD-10-CM | POA: Diagnosis not present

## 2020-12-08 DIAGNOSIS — A080 Rotaviral enteritis: Secondary | ICD-10-CM | POA: Diagnosis not present

## 2020-12-08 DIAGNOSIS — E7800 Pure hypercholesterolemia, unspecified: Secondary | ICD-10-CM | POA: Diagnosis not present

## 2020-12-08 DIAGNOSIS — R112 Nausea with vomiting, unspecified: Secondary | ICD-10-CM | POA: Diagnosis not present

## 2020-12-08 LAB — HEMOGLOBIN A1C: Hemoglobin A1C: 8.1

## 2020-12-20 DIAGNOSIS — E1022 Type 1 diabetes mellitus with diabetic chronic kidney disease: Secondary | ICD-10-CM | POA: Diagnosis not present

## 2020-12-20 DIAGNOSIS — I10 Essential (primary) hypertension: Secondary | ICD-10-CM | POA: Diagnosis not present

## 2020-12-20 DIAGNOSIS — N185 Chronic kidney disease, stage 5: Secondary | ICD-10-CM | POA: Diagnosis not present

## 2020-12-20 DIAGNOSIS — D631 Anemia in chronic kidney disease: Secondary | ICD-10-CM | POA: Diagnosis not present

## 2020-12-20 DIAGNOSIS — N2581 Secondary hyperparathyroidism of renal origin: Secondary | ICD-10-CM | POA: Diagnosis not present

## 2020-12-24 ENCOUNTER — Telehealth: Payer: Self-pay

## 2020-12-24 NOTE — Telephone Encounter (Unsigned)
Copied from [place] [id number]. Topic: General - Other >> Dec 24, 2020  2:34 PM Celene Kras wrote: Reason for CRM: Pt called stating that she is needing to have hosp f/u visit to check her potassium and her magnesium levels. She states that she cannot wait until next available appt on 01/30/21 with PCP. Please advise.

## 2020-12-24 NOTE — Telephone Encounter (Signed)
Scheduled Tuesday of next week at 340 PM for 40 minute appt slot.

## 2021-01-01 ENCOUNTER — Other Ambulatory Visit: Payer: Self-pay | Admitting: Internal Medicine

## 2021-01-01 ENCOUNTER — Other Ambulatory Visit: Payer: Self-pay

## 2021-01-01 ENCOUNTER — Other Ambulatory Visit
Admission: RE | Admit: 2021-01-01 | Discharge: 2021-01-01 | Disposition: A | Discharge status: Home / Self Care | Payer: BC Managed Care – PPO | Attending: Internal Medicine | Admitting: Internal Medicine

## 2021-01-01 ENCOUNTER — Encounter: Payer: Self-pay | Admitting: Internal Medicine

## 2021-01-01 ENCOUNTER — Ambulatory Visit (INDEPENDENT_AMBULATORY_CARE_PROVIDER_SITE_OTHER): Payer: BC Managed Care – PPO | Admitting: Internal Medicine

## 2021-01-01 VITALS — BP 114/64 | HR 80 | Temp 98.5°F | Ht 67.0 in | Wt 182.0 lb

## 2021-01-01 DIAGNOSIS — I10 Essential (primary) hypertension: Secondary | ICD-10-CM | POA: Diagnosis not present

## 2021-01-01 DIAGNOSIS — N185 Chronic kidney disease, stage 5: Secondary | ICD-10-CM

## 2021-01-01 DIAGNOSIS — E1042 Type 1 diabetes mellitus with diabetic polyneuropathy: Secondary | ICD-10-CM

## 2021-01-01 DIAGNOSIS — I120 Hypertensive chronic kidney disease with stage 5 chronic kidney disease or end stage renal disease: Secondary | ICD-10-CM | POA: Diagnosis not present

## 2021-01-01 DIAGNOSIS — N3000 Acute cystitis without hematuria: Secondary | ICD-10-CM | POA: Diagnosis not present

## 2021-01-01 DIAGNOSIS — R809 Proteinuria, unspecified: Secondary | ICD-10-CM | POA: Insufficient documentation

## 2021-01-01 LAB — BASIC METABOLIC PANEL
Anion gap: 8 (ref 5–15)
BUN: 68 mg/dL — ABNORMAL HIGH (ref 8–23)
CO2: 23 mmol/L (ref 22–32)
Calcium: 8.3 mg/dL — ABNORMAL LOW (ref 8.9–10.3)
Chloride: 104 mmol/L (ref 98–111)
Creatinine, Ser: 3.96 mg/dL — ABNORMAL HIGH (ref 0.44–1.00)
GFR, Estimated: 12 mL/min — ABNORMAL LOW (ref >60)
Glucose, Bld: 276 mg/dL — ABNORMAL HIGH (ref 70–99)
Potassium: 4.4 mmol/L (ref 3.5–5.1)
Sodium: 135 mmol/L (ref 135–145)

## 2021-01-01 LAB — POCT URINALYSIS DIPSTICK
Bilirubin, UA: NEGATIVE
Glucose, UA: NEGATIVE
Ketones, UA: NEGATIVE
Nitrite, UA: POSITIVE
Protein, UA: POSITIVE — AB
Spec Grav, UA: 1.02 (ref 1.010–1.025)
Urobilinogen, UA: 0.2 E.U./dL
pH, UA: 6 (ref 5.0–8.0)

## 2021-01-01 LAB — MAGNESIUM: Magnesium: 2.2 mg/dL (ref 1.7–2.4)

## 2021-01-01 MED ORDER — SULFAMETHOXAZOLE-TRIMETHOPRIM 800-160 MG PO TABS: 1.0000 | ORAL_TABLET | Freq: Every day | ORAL | 0 refills | Status: AC

## 2021-01-01 NOTE — Patient Instructions (Addendum)
03/04/2021 10:00 AM    Providers  Loleta Dicker, [facility]  NPI: [id number]  [place] [address]    Phone: [phone]  Fax: [phone]   For Blood pressure: According to discharge instructions you should be taking: *Carvedilol 12.5 mg twice a day *Amlodipine 5 mg once a day  Dr. Holley Raring thinks that you are also taking  *Losartan 100 mg once a day  Chlorthalidone was to be stopped and should be stopped now if you are still taking it.  Take the medications and doses marked with an asteric.

## 2021-01-01 NOTE — Progress Notes (Signed)
Date:  01/01/2021   Name:  Kelli [name]   DOB:  [birth year]   MRN:  [id number]   Chief Complaint: Hospitalization Follow-up (Recheck potassium and magnesium, pt feel ok today ) and Urinary Frequency (X1 week, Feels like she has to keep going to the bathroom, goes 3-4 times a day, pt gets up 1-2 times at night)  Urinary Frequency  This is a new problem. The current episode started in the past 7 days. The patient is experiencing no pain. Associated symptoms include frequency and urgency. Pertinent negatives include no chills, hematuria, nausea or vomiting.   Admitted to [facility] and discharged 12/18/20. 1. Nausea with vomiting, refractory with recurrent admissions. Because of her insulin-dependent diabetes, there is concern for diabetic gastroparesis. She was seen as a new patient in Gastroenterology clinic 5 days prior to admission. At that time, it was thought her symptoms are due primarily to constipation. Patient was prescribed an increased dose of senna. The patient subsequently had a significant increase in her stool with loose stools and diarrhea. This was initially attributed to too much senna, but the patient went on to develop positive GI pathogen panel for rotavirus. She had no ill contacts and the source of infection remained unidentified. She had continued improvement with her lower GI symptoms. It is noted she describes an unusual pattern of a bowel movement every 3 to 4 days but her stools being loose. She does take MiraLAX.  She was started on a course of scheduled IV Reglan which provided significant relief. She had been prescribed Reglan at the Gastroenterology visit shortly before this admission. She likely had breakthrough symptoms because of the viral gastroenteritis. >>> she is now taking Reglan only in the AM and doing well.  She has cut back on Miralax and her stools are now soft but formed.  On the day of discharge, the patient was eating and drinking, felt as though she would  be able to go home. She does have some signs of dehydration and hypokalemia was identified on laboratory studies and these were corrected prior to discharge. She is encouraged to take the Reglan before meals and not skip any doses. The Gastroenterology service was contacted to help arrange for a close follow-up visit after this hospitalization given the need for hospitalization and for close monitoring of her Reglan dosing and possible side effects. The potential neurologic side effects of Reglan was discussed with the patient in detail and she expresses understanding the potential risk for other complications.   2. Insulin-dependent diabetes mellitus, her dosage was adjusted during her hospitalization and she had no acute metabolic derangement while hospitalized.  3. Acute on chronic kidney disease, hypertension, volume depletion on admission. The patient had what appears to be an acute elevation in her creatinine. Her baseline creatinine appears to be 2.5-2.6. On admission, her BUN was 70 with a creatinine of 4.17. She had been dehydrated from vomiting and diarrhea. She was given IV fluids and her BUN dropped to 41 with creatinine of 3.17 at discharge. This is above her baseline. For this reason, her Cozaar was discontinued. For blood pressure control in the absence of Cozaar her carvedilol was increased to 12.5 mg p.o. twice daily from 6.25 mg and amlodipine 5 mg daily was added with good blood pressure control. It was discussed with the patient that calcium channel blocker amlodipine could conceivably contribute to worsening symptoms of diabetic gastroparesis, but this diagnosis has not been made and a gastric emptying study would be  appropriate when the patient symptoms have stabilized. She did have a gastric emptying study in 2017 that was normal. Her chlorthalidone is also held but she may still be slightly volume contracted. Outpatient nephrology and Primary Care follow-up issues include: Whether or  not to resume Cozaar Adjust carvedilol dose if needed Resume chlorthalidone, consider continuation of amlodipine >>> she is unsure of her medications. She thinks she is still taking the same as before admission.  Nephrology checked her K+ 2 weeks ago and it was normal.  He did not check Mg++.  She is waiting to be on the transplant list at [place].  Lab Results  Component Value Date   CREATININE 2.28 (H) 05/30/2020   BUN 49 (H) 05/30/2020   NA 138 05/30/2020   K 4.3 05/30/2020   CL 108 05/30/2020   CO2 23 05/30/2020   Lab Results  Component Value Date   CHOL 226 (H) 12/08/2019   HDL 45 12/08/2019   LDLCALC 132 (H) 12/08/2019   TRIG 245 (H) 12/08/2019   CHOLHDL 5.0 12/08/2019   Lab Results  Component Value Date   TSH 1.794 09/02/2018   Lab Results  Component Value Date   HGBA1C 8.1 12/08/2020   Lab Results  Component Value Date   WBC 7.0 09/02/2018   HGB 11.7 (L) 09/02/2018   HCT 36.3 09/02/2018   MCV 87.1 09/02/2018   PLT 347 09/02/2018   Lab Results  Component Value Date   ALT 15 05/30/2020   AST 19 05/30/2020   ALKPHOS 67 05/30/2020   BILITOT 0.5 05/30/2020     Review of Systems  Constitutional: Positive for fatigue. Negative for chills, diaphoresis, fever and unexpected weight change.  Respiratory: Negative for chest tightness and shortness of breath.   Cardiovascular: Negative for chest pain and leg swelling.  Gastrointestinal: Negative for blood in stool, nausea and vomiting.  Genitourinary: Positive for frequency and urgency. Negative for dysuria and hematuria.  Neurological: Negative for dizziness.    Patient Active Problem List   Diagnosis Date Noted  . Myalgia due to statin 10/12/2020  . Status post hysterectomy 10/02/2020  . CKD (chronic kidney disease) stage 5, GFR less than 15 ml/min (HCC) 05/30/2020  . Psychophysiological insomnia 05/30/2020  . 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 Neovascular  glaucoma of left eye, 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  . Mixed hyperlipidemia 06/30/2019  . Hypomagnesemia 06/30/2019  . Microalbuminuria 09/24/2018  . Neuropathy of left foot 03/18/2017  . 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 Charcot foot due to diabetes mellitus ([place]) 02/09/2017  . Venous insufficiency of both lower extremities 02/09/2017  . Chronic idiopathic constipation 01/26/2017  . Intractable vomiting with nausea 01/26/2017  . Abnormal thyroid blood test 09/30/2016  . Essential hypertension 08/21/2016  . Adrenal mass ([place]) 08/18/2016  . Temporomandibular joint pain 11/02/2014  . Moderate episode of recurrent major depressive disorder ([place]) 11/02/2014  . 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[place]) 06/28/2014  . Eustachian tube dysfunction 05/10/2014  . Hyperlipidemia 10/03/2013  . 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 Vitamin D deficiency 12/30/2012  . Corns and callosity 07/09/2012  . Hammer toe, acquired 07/09/2012    Allergies  Allergen Reactions  . Other Nausea And Vomiting  . Statins Nausea And Vomiting  . Gabapentin Swelling  . Hydrochlorothiazide Nausea And Vomiting  . Lisinopril Other (See Comments)    fatigue  . Lyrica [Pregabalin] Swelling  . Metformin And Related Nausea And Vomiting  .  Penicillins Nausea And Vomiting    Past Surgical History:  Procedure Laterality Date  . ABDOMINAL HYSTERECTOMY    . COLONOSCOPY W/ POLYPECTOMY    . ESOPHAGOGASTRODUODENOSCOPY  03/02/2017   Normal  . HEMORRHOID SURGERY      Social History   Tobacco Use  . Smoking status: Former Research scientist (life sciences)  . Smokeless tobacco: Never Used  Vaping Use  . Vaping Use: Never used  Substance Use Topics  . Alcohol use: No  . Drug use: No     Medication list has been reviewed and updated.  Current Meds  Medication Sig  . BD PEN NEEDLE NANO U/F 32G X 4 MM MISC Inject 1 each into the skin 3 (three) times daily.  . carvedilol (COREG) 6.25 MG tablet Take 6.25 mg by  mouth 2 (two) times daily.  . chlorthalidone (HYGROTON) 25 MG tablet Take 12.5 mg by mouth daily.  . Continuous Blood Gluc Receiver (FREESTYLE LIBRE 14 DAY READER) DEVI See admin instructions.  . Continuous Blood Gluc Sensor (FREESTYLE LIBRE 14 DAY SENSOR) MISC USE AS DIRECTED EVERY 14 DAYS  . escitalopram (LEXAPRO) 10 MG tablet Take 10 mg by mouth daily.  Marland Kitchen glucose blood (FREESTYLE LITE) test strip 1 each by Other route five (5) times a day.  . insulin glargine (LANTUS) 100 UNIT/ML Solostar Pen Inject 15 Units into the skin daily.  Marland Kitchen losartan (COZAAR) 100 MG tablet Take 1 tablet by mouth daily at 6 (six) AM.  . metoCLOPramide (REGLAN) 5 MG tablet Take 5 mg by mouth every 8 (eight) hours as needed.  . mirtazapine (REMERON) 7.5 MG tablet TAKE 1 TABLET BY MOUTH EVERY DAY NIGHTLY  . mupirocin ointment (BACTROBAN) 2 % Apply 1 application topically 2 (two) times daily.  . pantoprazole (PROTONIX) 40 MG tablet Take 40 mg by mouth daily.  Marland Kitchen senna (SENOKOT) 8.6 MG tablet Take by mouth.  . sulfamethoxazole-trimethoprim (BACTRIM DS) 800-160 MG tablet Take 1 tablet by mouth daily for 7 doses.    PHQ 2/9 Scores 01/01/2021 11/09/2020 10/12/2020 07/30/2020  PHQ - 2 Score 0 0 4 0  PHQ- 9 Score 0 0 14 0    GAD 7 : Generalized Anxiety Score 01/01/2021 11/09/2020 10/12/2020 07/30/2020  Nervous, Anxious, on Edge 0 0 2 0  Control/stop worrying 0 0 2 0  Worry too much - different things 0 0 2 0  Trouble relaxing 0 0 2 0  Restless 0 0 0 0  Easily annoyed or irritable 0 0 0 0  Afraid - awful might happen 0 0 0 0  Total GAD 7 Score 0 0 8 0  Anxiety Difficulty - Not difficult at all - -    BP Readings from Last 3 Encounters:  01/01/21 114/64  11/09/20 132/74  10/12/20 126/66    Physical Exam Vitals and nursing note reviewed.  Constitutional:      Appearance: Normal appearance. She is well-developed.  Cardiovascular:     Rate and Rhythm: Normal rate and regular rhythm.     Pulses: Normal pulses.     Heart  sounds: Normal heart sounds.  Pulmonary:     Effort: Pulmonary effort is normal. No respiratory distress.     Breath sounds: Normal breath sounds.  Abdominal:     General: Bowel sounds are normal.     Palpations: Abdomen is soft.     Tenderness: There is abdominal tenderness in the suprapubic area. There is no guarding or rebound.  Musculoskeletal:     Right lower leg: No  edema.     Left lower leg: No edema.  Neurological:     General: No focal deficit present.     Wt Readings from Last 3 Encounters:  01/01/21 182 lb (82.6 kg)  11/09/20 181 lb (82.1 kg)  10/12/20 183 lb (83 kg)    BP 114/64   Pulse 80   Temp 98.5 F (36.9 C) (Oral)   Ht 5\' 7"  (1.702 m)   Wt 182 lb (82.6 kg)   SpO2 96%   BMI 28.51 kg/m   Assessment and Plan: 1. Acute cystitis without hematuria Will send for culture Treat with once daily Bactrim - POCT urinalysis dipstick - Urine Culture - sulfamethoxazole-trimethoprim (BACTRIM DS) 800-160 MG tablet; Take 1 tablet by mouth daily for 7 doses.  Dispense: 7 tablet; Refill: 0  2. CKD (chronic kidney disease) stage 5, GFR less than 15 ml/min (HCC) Recent K+ was normal Patient requested repeat testing - Basic metabolic panel  3. Essential hypertension Clinically stable exam with well controlled BP. Tolerating medications without side effects at this time. Unclear exactly what she is taking but it should be amlodipine 5 mg, coreg 12.5 mg bid and losartan 100 mg. Stop chlorthalidone Pt to continue current regimen and low sodium diet; benefits of regular exercise as able discussed.  4. Microalbuminuria Due to CKD  5. Hypomagnesemia Check levels - Magnesium  6. 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[place]) Stable on current medications with A1C 8.1   Partially dictated using Editor, commissioning. Any errors are unintentional.  Halina Maidens, MD [facility]  01/01/2021

## 2021-01-02 NOTE — Progress Notes (Signed)
Called pt left VM with labs. Normal magnesium and potassium with a unchanged kidney function. Pt name was stated on VM.  KP

## 2021-01-07 ENCOUNTER — Other Ambulatory Visit: Payer: Self-pay | Admitting: Internal Medicine

## 2021-01-07 ENCOUNTER — Telehealth: Payer: Self-pay | Admitting: Internal Medicine

## 2021-01-07 DIAGNOSIS — B9620 Unspecified Escherichia coli [E. coli] as the cause of diseases classified elsewhere: Secondary | ICD-10-CM

## 2021-01-07 LAB — URINE CULTURE

## 2021-01-07 MED ORDER — NITROFURANTOIN MONOHYD MACRO 100 MG PO CAPS: 100.0000 mg | ORAL_CAPSULE | Freq: Two times a day (BID) | ORAL | 0 refills | Status: AC

## 2021-01-07 NOTE — Telephone Encounter (Signed)
Let patient know that I sent in [place] for her UTI with E coli.

## 2021-01-07 NOTE — Telephone Encounter (Signed)
Patient informed the Bactrim that was sent in at appt is not going to treat infection based off Urine Culture. Told her to pick up Macrobid in its place and take this for the next 7 days and this should treat infection. She verbalized understanding.

## 2021-01-17 ENCOUNTER — Other Ambulatory Visit: Payer: Self-pay | Admitting: Internal Medicine

## 2021-01-17 DIAGNOSIS — F331 Major depressive disorder, recurrent, moderate: Secondary | ICD-10-CM

## 2021-01-21 DIAGNOSIS — Z794 Long term (current) use of insulin: Secondary | ICD-10-CM | POA: Diagnosis not present

## 2021-01-21 DIAGNOSIS — Z87891 Personal history of nicotine dependence: Secondary | ICD-10-CM | POA: Diagnosis not present

## 2021-01-21 DIAGNOSIS — Z20822 Contact with and (suspected) exposure to covid-19: Secondary | ICD-10-CM | POA: Diagnosis not present

## 2021-01-21 DIAGNOSIS — E1022 Type 1 diabetes mellitus with diabetic chronic kidney disease: Secondary | ICD-10-CM | POA: Diagnosis not present

## 2021-01-21 DIAGNOSIS — E785 Hyperlipidemia, unspecified: Secondary | ICD-10-CM | POA: Diagnosis not present

## 2021-01-21 DIAGNOSIS — F419 Anxiety disorder, unspecified: Secondary | ICD-10-CM | POA: Diagnosis not present

## 2021-01-21 DIAGNOSIS — E1042 Type 1 diabetes mellitus with diabetic polyneuropathy: Secondary | ICD-10-CM | POA: Diagnosis not present

## 2021-01-21 DIAGNOSIS — R197 Diarrhea, unspecified: Secondary | ICD-10-CM | POA: Diagnosis not present

## 2021-01-21 DIAGNOSIS — E7800 Pure hypercholesterolemia, unspecified: Secondary | ICD-10-CM | POA: Diagnosis not present

## 2021-01-21 DIAGNOSIS — R112 Nausea with vomiting, unspecified: Secondary | ICD-10-CM | POA: Diagnosis not present

## 2021-01-21 DIAGNOSIS — Z880 Allergy status to penicillin: Secondary | ICD-10-CM | POA: Diagnosis not present

## 2021-01-21 DIAGNOSIS — N184 Chronic kidney disease, stage 4 (severe): Secondary | ICD-10-CM | POA: Diagnosis not present

## 2021-01-21 DIAGNOSIS — I129 Hypertensive chronic kidney disease with stage 1 through stage 4 chronic kidney disease, or unspecified chronic kidney disease: Secondary | ICD-10-CM | POA: Diagnosis not present

## 2021-01-21 DIAGNOSIS — N390 Urinary tract infection, site not specified: Secondary | ICD-10-CM | POA: Diagnosis not present

## 2021-01-21 DIAGNOSIS — R1084 Generalized abdominal pain: Secondary | ICD-10-CM | POA: Diagnosis not present

## 2021-01-21 DIAGNOSIS — R8281 Pyuria: Secondary | ICD-10-CM | POA: Diagnosis not present

## 2021-01-21 DIAGNOSIS — E1065 Type 1 diabetes mellitus with hyperglycemia: Secondary | ICD-10-CM | POA: Diagnosis not present

## 2021-01-21 DIAGNOSIS — N179 Acute kidney failure, unspecified: Secondary | ICD-10-CM | POA: Diagnosis not present

## 2021-01-21 DIAGNOSIS — R059 Cough, unspecified: Secondary | ICD-10-CM | POA: Diagnosis not present

## 2021-01-22 DIAGNOSIS — R112 Nausea with vomiting, unspecified: Secondary | ICD-10-CM | POA: Diagnosis not present

## 2021-01-22 DIAGNOSIS — N179 Acute kidney failure, unspecified: Secondary | ICD-10-CM | POA: Diagnosis not present

## 2021-01-22 DIAGNOSIS — I129 Hypertensive chronic kidney disease with stage 1 through stage 4 chronic kidney disease, or unspecified chronic kidney disease: Secondary | ICD-10-CM | POA: Diagnosis not present

## 2021-01-22 DIAGNOSIS — R8281 Pyuria: Secondary | ICD-10-CM | POA: Diagnosis not present

## 2021-01-24 ENCOUNTER — Telehealth: Payer: Self-pay

## 2021-01-24 NOTE — Telephone Encounter (Signed)
Patient needs to schedule an appointment to follow up for diabetes after 03/09/2021.  Thank you!

## 2021-01-25 ENCOUNTER — Other Ambulatory Visit
Admission: RE | Admit: 2021-01-25 | Discharge: 2021-01-25 | Disposition: A | Discharge status: Home / Self Care | Payer: BC Managed Care – PPO

## 2021-01-25 ENCOUNTER — Other Ambulatory Visit: Payer: Self-pay

## 2021-01-25 DIAGNOSIS — N184 Chronic kidney disease, stage 4 (severe): Secondary | ICD-10-CM | POA: Diagnosis not present

## 2021-01-25 LAB — BASIC METABOLIC PANEL
Anion gap: 9 (ref 5–15)
BUN: 42 mg/dL — ABNORMAL HIGH (ref 8–23)
CO2: 28 mmol/L (ref 22–32)
Calcium: 8.3 mg/dL — ABNORMAL LOW (ref 8.9–10.3)
Chloride: 94 mmol/L — ABNORMAL LOW (ref 98–111)
Creatinine, Ser: 3.37 mg/dL — ABNORMAL HIGH (ref 0.44–1.00)
GFR, Estimated: 15 mL/min — ABNORMAL LOW (ref >60)
Glucose, Bld: 326 mg/dL — ABNORMAL HIGH (ref 70–99)
Potassium: 2.9 mmol/L — ABNORMAL LOW (ref 3.5–5.1)
Sodium: 131 mmol/L — ABNORMAL LOW (ref 135–145)

## 2021-01-28 ENCOUNTER — Encounter: Payer: Self-pay | Admitting: Family Medicine

## 2021-01-28 ENCOUNTER — Other Ambulatory Visit: Payer: Self-pay

## 2021-01-28 ENCOUNTER — Ambulatory Visit (INDEPENDENT_AMBULATORY_CARE_PROVIDER_SITE_OTHER): Payer: BC Managed Care – PPO | Admitting: Family Medicine

## 2021-01-28 ENCOUNTER — Ambulatory Visit: Payer: Self-pay

## 2021-01-28 VITALS — BP 124/68 | HR 75 | Ht 67.0 in | Wt 172.8 lb

## 2021-01-28 DIAGNOSIS — R300 Dysuria: Secondary | ICD-10-CM

## 2021-01-28 LAB — POCT URINALYSIS DIPSTICK
Bilirubin, UA: NEGATIVE
Glucose, UA: POSITIVE — AB
Ketones, UA: NEGATIVE
Leukocytes, UA: NEGATIVE
Nitrite, UA: NEGATIVE
Protein, UA: POSITIVE — AB
Spec Grav, UA: 1.015 (ref 1.010–1.025)
Urobilinogen, UA: 0.2 E.U./dL
pH, UA: 5 (ref 5.0–8.0)

## 2021-01-28 MED ORDER — NITROFURANTOIN MONOHYD MACRO 100 MG PO CAPS: 100.0000 mg | ORAL_CAPSULE | Freq: Two times a day (BID) | ORAL | 0 refills | Status: DC

## 2021-01-28 NOTE — Telephone Encounter (Signed)
Pt.'s husband reports pt. Seen in ED 01/21/21 with nausea and diagnosed with UTI. Still has vomiting about 3 x daily. "Not keeping a lot down." Husband concerned she still has UTI.Has an appointment with GI "later this month." Husband would like her seen today. Could get her in at 0800 time slot. Please advise. Reason for Disposition . [1] MILD or MODERATE vomiting AND [2] present > 48 hours (2 days) (Exception: mild vomiting with associated diarrhea)  Answer Assessment - Initial Assessment Questions 1. VOMITING SEVERITY: "How many times have you vomited in the past 24 hours?"     - MILD:  1 - 2 times/day    - MODERATE: 3 - 5 times/day, decreased oral intake without significant weight loss or symptoms of dehydration    - SEVERE: 6 or more times/day, vomits everything or nearly everything, with significant weight loss, symptoms of dehydration      3 2. ONSET: "When did the vomiting begin?"      A couple months 3. FLUIDS: "What fluids or food have you vomited up today?" "Have you been able to keep any fluids down?"     No 4. ABDOMINAL PAIN: "Are your having any abdominal pain?" If yes : "How bad is it and what does it feel like?" (e.g., crampy, dull, intermittent, constant)      No 5. DIARRHEA: "Is there any diarrhea?" If Yes, ask: "How many times today?"      No 6. CONTACTS: "Is there anyone else in the family with the same symptoms?"      No 7. CAUSE: "What do you think is causing your vomiting?"     Unsure 8. HYDRATION STATUS: "Any signs of dehydration?" (e.g., dry mouth [not only dry lips], too weak to stand) "When did you last urinate?"     No 9. OTHER SYMPTOMS: "Do you have any other symptoms?" (e.g., fever, headache, vertigo, vomiting blood or coffee grounds, recent head injury)     Weak 10. PREGNANCY: "Is there any chance you are pregnant?" "When was your last menstrual period?"       no  Protocols used: [employer]

## 2021-01-28 NOTE — Telephone Encounter (Signed)
Patient coming in today for acute visit to see Dr Zigmund Daniel for UTI check.

## 2021-01-28 NOTE — Assessment & Plan Note (Signed)
Type 2 diabetes mellitus patient with dysuria, nausea, emesis times several days presenting for further evaluation.  Of note, she was recently discharged from [facility] where she stayed from 01/21/2021-01/22/2021 as they were treating intractable emesis.  There she was treated for intractable nausea and vomiting, pyuria, acute kidney injury in the setting of CKD 4, hypertension, DM 1.5.   Physical exam reveals benign cardiopulmonary exam, mild left-sided CVA tenderness, mild suprapubic tenderness, normoactive bowel sounds without tenderness or rebound, oropharynx is moist.  She is in mild distress due to nausea and did have 1 episode of scant emesis with food contents during the visit.  She has self discontinued Reglan over the past 3 days.  A urinalysis was performed.  Given her clinical symptoms we will send this UA for culture and initiate Macrobid, this can be discontinued or modified pending culture results.  I have advised her to restart Reglan and placed at an emphasis on a bland diet, diabetic diet restrictions.  I have also advised her to maintain follow-up with her primary care provider Dr. Army Melia.  Patient and her husband are amenable to this and plan to schedule this today.

## 2021-01-28 NOTE — Patient Instructions (Addendum)
-   Dose macrobid (antibiotic) x 7 days for full course - Drink plenty of water - Continue Reglan for nausea - Follow-up with Dr. Army Melia on/after 7/16

## 2021-01-29 DIAGNOSIS — E113513 Type 2 diabetes mellitus with proliferative diabetic retinopathy with macular edema, bilateral: Secondary | ICD-10-CM | POA: Diagnosis not present

## 2021-01-29 DIAGNOSIS — E113511 Type 2 diabetes mellitus with proliferative diabetic retinopathy with macular edema, right eye: Secondary | ICD-10-CM | POA: Diagnosis not present

## 2021-01-30 ENCOUNTER — Telehealth: Payer: Self-pay

## 2021-01-30 ENCOUNTER — Ambulatory Visit: Payer: Self-pay | Admitting: *Deleted

## 2021-01-30 LAB — SPECIMEN STATUS REPORT

## 2021-01-30 LAB — URINE CULTURE: Organism ID, Bacteria: NO GROWTH

## 2021-01-30 NOTE — Telephone Encounter (Signed)
Spoke to pt told her she would have to get fluids from the ER. Also told pt that she would need to call GI for nausea and vomiting. Pt verbalized understanding.  KP

## 2021-01-30 NOTE — Telephone Encounter (Signed)
Pt called in and stated to reach out to her personally instead of the husband, husband is at work.  ----- Message from Jodie Echevaria sent at 01/30/2021 10:10 AM EDT -----  Patient husband called in asking to speak to a nurse stated that his wife is still vomiting since being seen on Monday 01/28/21 cant keep nothing down so the antibiotics given is doing nothing for her. Seeking some help please Ph# [phone]   Call to patient- she is reporting she still has vomiting and is still having trouble keeping anything down. Patient is taking Reglan for nausea. Patient thinks she is having GI problems- patient has appointment tomorrow with urologist and GI 6/27. Patient states she believes her BP medication is making her BP go low at times- has been as low as 110/60- patient is only taking 2 BP medications now- she did not start the new one yet due to illness.  BP now- 140/67. Just took medication. Patient wants to know if PCP feels she needs fluids- can they be ordered- or does she have to go to ED and have the whole work up done- or can UC do that there? Told patient I was not sure- but would send message and office would call her back. Patient reports her glucose this morning was 108 fasting. Patient states she feels she does not need to be seen today- husband is worried about her-will send update to office for PCP review and response.  Reason for Disposition . [1] MILD or MODERATE vomiting AND [2] present > 48 hours (2 days) (Exception: mild vomiting with associated diarrhea)  Answer Assessment - Initial Assessment Questions 1. VOMITING SEVERITY: "How many times have you vomited in the past 24 hours?"     - MILD:  1 - 2 times/day    - MODERATE: 3 - 5 times/day, decreased oral intake without significant weight loss or symptoms of dehydration    - SEVERE: 6 or more times/day, vomits everything or nearly everything, with significant weight loss, symptoms of dehydration      3- mostly liquid 2. ONSET:  "When did the vomiting begin?"      2 weeks 3. FLUIDS: "What fluids or food have you vomited up today?" "Have you been able to keep any fluids down?"     Patient just ate cereal this morning- was able to keep potatoe down yesterday 4. ABDOMINAL PAIN: "Are your having any abdominal pain?" If yes : "How bad is it and what does it feel like?" (e.g., crampy, dull, intermittent, constant)      Lower abdominal pain this am 5. DIARRHEA: "Is there any diarrhea?" If Yes, ask: "How many times today?"      No-BM for 1 week 6. CONTACTS: "Is there anyone else in the family with the same symptoms?"      no 7. CAUSE: "What do you think is causing your vomiting?"     Unknown- was in hospital 8. HYDRATION STATUS: "Any signs of dehydration?" (e.g., dry mouth [not only dry lips], too weak to stand) "When did you last urinate?"     Dizzy yesterday- last urinate this morning, patient reports she is feeling better today. Patient is doing sips- patient feels things were passing right through her mostly- if food is not chewed throughly- she feels she throws up 9. OTHER SYMPTOMS: "Do you have any other symptoms?" (e.g., fever, headache, vertigo, vomiting blood or coffee grounds, recent head injury)     During night gets real hot with cold  chills- 'like a bad hot flash"  10. PREGNANCY: "Is there any chance you are pregnant?" "When was your last menstrual period?"       n/a  Protocols used: [facility]

## 2021-01-30 NOTE — Telephone Encounter (Signed)
I just called patient and she didn't answer. I left her a voicemail to stop the anti-biotics since urine culture was negative.  Patient has appointment with Dr. Army Melia on Friday.  Please advise.

## 2021-01-30 NOTE — Telephone Encounter (Signed)
Duplicate message  KP 

## 2021-01-30 NOTE — Progress Notes (Signed)
Please let patient know that urine culture came back negative, she can stop the antibiotics as there is no organism to treat. Her symptoms expressed during our office visit can be attributed to high blood sugar, gastroparesis, or additional causes that are best addressed during her visit with Dr. Army Melia.

## 2021-01-30 NOTE — Telephone Encounter (Signed)
Copied from [place] [id number]. Topic: General - Other >> Jan 30, 2021 10:03 AM Jodie Echevaria wrote: Reason for CRM: Patient husband called in asking to speak to Dr Army Melia stated that his wife is still vomiting cant keep nothing down so the antibiotics given is doing nothing for her. Seeking some help please  Ph# [phone]

## 2021-01-31 ENCOUNTER — Ambulatory Visit
Admission: RE | Admit: 2021-01-31 | Discharge: 2021-01-31 | Disposition: A | Discharge status: Home / Self Care | Payer: BC Managed Care – PPO | Source: Ambulatory Visit | Attending: Nephrology | Admitting: Nephrology

## 2021-01-31 ENCOUNTER — Other Ambulatory Visit: Payer: Self-pay

## 2021-01-31 DIAGNOSIS — N185 Chronic kidney disease, stage 5: Secondary | ICD-10-CM | POA: Diagnosis not present

## 2021-01-31 DIAGNOSIS — E860 Dehydration: Secondary | ICD-10-CM | POA: Insufficient documentation

## 2021-01-31 DIAGNOSIS — I10 Essential (primary) hypertension: Secondary | ICD-10-CM | POA: Diagnosis not present

## 2021-01-31 DIAGNOSIS — H4042X3 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, left eye, severe stage: Secondary | ICD-10-CM | POA: Diagnosis not present

## 2021-01-31 DIAGNOSIS — E1022 Type 1 diabetes mellitus with diabetic chronic kidney disease: Secondary | ICD-10-CM | POA: Diagnosis not present

## 2021-01-31 DIAGNOSIS — N2581 Secondary hyperparathyroidism of renal origin: Secondary | ICD-10-CM | POA: Diagnosis not present

## 2021-01-31 MED ORDER — POTASSIUM CHLORIDE IN NACL 20-0.9 MEQ/L-% IV SOLN
INTRAVENOUS | Status: DC
  Filled 2021-01-31: qty 1000

## 2021-01-31 MED ORDER — SODIUM CHLORIDE 0.9 % IV SOLN
Freq: Once | INTRAVENOUS | Status: AC
  Filled 2021-01-31 (×2): qty 125

## 2021-02-01 ENCOUNTER — Ambulatory Visit: Payer: BC Managed Care – PPO | Admitting: Internal Medicine

## 2021-02-01 ENCOUNTER — Ambulatory Visit
Admission: RE | Admit: 2021-02-01 | Discharge: 2021-02-01 | Disposition: A | Discharge status: Home / Self Care | Payer: BC Managed Care – PPO | Source: Ambulatory Visit | Attending: Nephrology | Admitting: Nephrology

## 2021-02-01 MED ORDER — SODIUM CHLORIDE 0.9 % IV SOLN
INTRAVENOUS | Status: DC
  Filled 2021-02-01 (×10): qty 150

## 2021-02-01 MED ORDER — POTASSIUM CHLORIDE IN NACL 20-0.9 MEQ/L-% IV SOLN: INTRAVENOUS | Status: DC

## 2021-02-15 ENCOUNTER — Ambulatory Visit (INDEPENDENT_AMBULATORY_CARE_PROVIDER_SITE_OTHER): Payer: BC Managed Care – PPO | Admitting: Internal Medicine

## 2021-02-15 ENCOUNTER — Other Ambulatory Visit: Payer: Self-pay

## 2021-02-15 ENCOUNTER — Encounter: Payer: Self-pay | Admitting: Internal Medicine

## 2021-02-15 VITALS — BP 136/98 | HR 73 | Temp 98.5°F | Ht 67.0 in | Wt 177.0 lb

## 2021-02-15 DIAGNOSIS — I10 Essential (primary) hypertension: Secondary | ICD-10-CM

## 2021-02-15 DIAGNOSIS — E1042 Type 1 diabetes mellitus with diabetic polyneuropathy: Secondary | ICD-10-CM

## 2021-02-15 DIAGNOSIS — N3000 Acute cystitis without hematuria: Secondary | ICD-10-CM | POA: Diagnosis not present

## 2021-02-15 DIAGNOSIS — R112 Nausea with vomiting, unspecified: Secondary | ICD-10-CM

## 2021-02-15 LAB — POCT URINALYSIS DIPSTICK
Bilirubin, UA: NEGATIVE
Glucose, UA: POSITIVE — AB
Ketones, UA: NEGATIVE
Leukocytes, UA: NEGATIVE
Nitrite, UA: NEGATIVE
Protein, UA: POSITIVE — AB
Spec Grav, UA: 1.015 (ref 1.010–1.025)
Urobilinogen, UA: 0.2 E.U./dL
pH, UA: 6 (ref 5.0–8.0)

## 2021-02-15 NOTE — Progress Notes (Signed)
Date:  02/15/2021   Name:  Kelli [name]   DOB:  1960-11-19   MRN:  [id number]   Chief Complaint: Diabetes  Diabetes She presents for her follow-up diabetic visit. She has type 2 diabetes mellitus. Her disease course has been stable. Pertinent negatives for hypoglycemia include no headaches or tremors. Pertinent negatives for diabetes include no chest pain, no fatigue, no polydipsia and no polyuria.  Urinary Tract Infection  This is a recurrent problem. The problem has been resolved. The patient is experiencing no pain. There has been no fever. Pertinent negatives include no hematuria. She has tried antibiotics for the symptoms. The treatment provided significant relief.  Hypertension This is a chronic problem. The problem is controlled (fairly well controlled). Pertinent negatives include no chest pain, headaches, palpitations or shortness of breath. Past treatments include angiotensin blockers and diuretics (has amlodipine that was prescribed at hospital discharge in April but she has never taken it.). The current treatment provides moderate improvement. Hypertensive end-organ damage includes kidney disease.  Nausea/vomiting - much better controlled on Reglan qid.  She did vomit this am after taking her medication.  She feels like the vomiting it triggered by irritation in her throat.   Lab Results  Component Value Date   CREATININE 3.37 (H) 01/25/2021   BUN 42 (H) 01/25/2021   NA 131 (L) 01/25/2021   K 2.9 (L) 01/25/2021   CL 94 (L) 01/25/2021   CO2 28 01/25/2021   Lab Results  Component Value Date   CHOL 226 (H) 12/08/2019   HDL 45 12/08/2019   LDLCALC 132 (H) 12/08/2019   TRIG 245 (H) 12/08/2019   CHOLHDL 5.0 12/08/2019   Lab Results  Component Value Date   TSH 1.794 09/02/2018   Lab Results  Component Value Date   HGBA1C 8.1 12/08/2020   Lab Results  Component Value Date   WBC 7.0 09/02/2018   HGB 11.7 (L) 09/02/2018   HCT 36.3 09/02/2018   MCV 87.1 09/02/2018    PLT 347 09/02/2018   Lab Results  Component Value Date   ALT 15 05/30/2020   AST 19 05/30/2020   ALKPHOS 67 05/30/2020   BILITOT 0.5 05/30/2020     Review of Systems  Constitutional:  Negative for appetite change, fatigue, fever and unexpected weight change.  HENT:  Positive for trouble swallowing. Negative for tinnitus.   Eyes:  Negative for visual disturbance.  Respiratory:  Negative for cough, chest tightness and shortness of breath.   Cardiovascular:  Negative for chest pain, palpitations and leg swelling.  Gastrointestinal:  Negative for abdominal pain.  Endocrine: Negative for polydipsia and polyuria.  Genitourinary:  Negative for dysuria and hematuria.  Musculoskeletal:  Positive for arthralgias and myalgias.  Neurological:  Negative for tremors, numbness and headaches.  Psychiatric/Behavioral:  Negative for dysphoric mood.    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   Charcot foot due to diabetes mellitus ([place]) 02/09/2017   Venous insufficiency of both lower extremities  02/09/2017   Chronic idiopathic constipation 01/26/2017   Intractable vomiting with nausea 01/26/2017   Abnormal thyroid blood test 09/30/2016   Essential hypertension 08/21/2016   Adrenal mass ([place]) 08/18/2016   Temporomandibular joint pain 11/02/2014   Moderate episode of recurrent major depressive disorder ([place]) 11/02/2014   DM type 1 with diabetic peripheral  neuropathy ([place]) 06/28/2014   Eustachian tube dysfunction 05/10/2014   Hyperlipidemia 10/03/2013   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   Vitamin D deficiency 12/30/2012   Corns and callosity 07/09/2012   Hammer toe, acquired 07/09/2012    Allergies  Allergen Reactions   Statins Nausea And Vomiting   Gabapentin Swelling   Hydrochlorothiazide Nausea And Vomiting   Lisinopril Other (See Comments)    fatigue   Lyrica [Pregabalin] Swelling   Metformin And Related Nausea And Vomiting   Penicillins Nausea And Vomiting    Past Surgical History:  Procedure Laterality Date   ABDOMINAL HYSTERECTOMY     COLONOSCOPY W/ POLYPECTOMY     ESOPHAGOGASTRODUODENOSCOPY  03/02/2017   Normal   HEMORRHOID SURGERY      Social History   Tobacco Use   Smoking status: Former    Pack years: 0.00   Smokeless tobacco: Never  Vaping Use   Vaping Use: Never used  Substance Use Topics   Alcohol use: No   Drug use: No     Medication list has been reviewed and updated.  Current Meds  Medication Sig   BD PEN NEEDLE NANO U/F 32G X 4 MM MISC Inject 1 each into the skin 3 (three) times daily.   carvedilol (COREG) 6.25 MG tablet Take 6.25 mg by mouth 2 (two) times daily.   chlorthalidone (HYGROTON) 25 MG tablet Take 12.5 mg by mouth daily.   Continuous Blood Gluc Receiver (FREESTYLE LIBRE 14 DAY READER) DEVI See admin instructions.   Continuous Blood Gluc Sensor (FREESTYLE LIBRE 14 DAY SENSOR) MISC USE AS DIRECTED EVERY 14 DAYS   dorzolamide-timolol (COSOPT) 22.3-6.8 MG/ML ophthalmic solution Place 1 drop into the left eye 2 (two) times daily.   escitalopram (LEXAPRO) 10 MG tablet Take 5 mg by mouth daily.   glucose blood (FREESTYLE LITE) test strip 1 each by Other route five (5) times a day.   insulin glargine (LANTUS) 100 UNIT/ML Solostar Pen Inject 16 Units into the skin daily.   insulin lispro (HUMALOG) 100 UNIT/ML injection Inject into the skin 3 (three) times daily before meals.  SSI   latanoprost (XALATAN) 0.005 % ophthalmic solution Administer 1 drop into the left eye nightly.   metoCLOPramide (REGLAN) 5 MG tablet Take 5 mg by mouth every 8 (eight) hours as needed.   mirtazapine (REMERON) 7.5 MG tablet    mupirocin ointment (BACTROBAN) 2 % Apply 1 application topically 2 (two) times daily.   pantoprazole (PROTONIX) 40 MG tablet Take 40 mg by mouth daily.   senna (SENOKOT) 8.6 MG tablet Take 1 tablet by mouth daily.   [DISCONTINUED] calcitRIOL (ROCALTROL) 0.25 MCG capsule Take by mouth.   [DISCONTINUED] nitrofurantoin, macrocrystal-monohydrate, (MACROBID) 100 MG capsule Take 1 capsule (100 mg total) by mouth 2 (two) times daily.   [DISCONTINUED] promethazine (PHENERGAN) 12.5 MG tablet TAKE 1 TABLET (12.5 MG TOTAL) BY MOUTH EVERY SIX (6) HOURS AS NEEDED.    PHQ 2/9 Scores 02/15/2021 01/28/2021 01/01/2021 11/09/2020  PHQ - 2 Score 0 6 0 0  PHQ- 9 Score 0 23 0 0    GAD 7 : Generalized Anxiety Score 02/15/2021 01/28/2021 01/01/2021 11/09/2020  Nervous,  Anxious, on Edge 0 3 0 0  Control/stop worrying 0 1 0 0  Worry too much - different things 0 0 0 0  Trouble relaxing 0 3 0 0  Restless 0 0 0 0  Easily annoyed or irritable 0 0 0 0  Afraid - awful might happen 0 0 0 0  Total GAD 7 Score 0 7 0 0  Anxiety Difficulty - Very difficult - Not difficult at all    BP Readings from Last 3 Encounters:  02/15/21 (!) 136/98  01/28/21 124/68  01/01/21 114/64    Physical Exam Vitals and nursing note reviewed.  Constitutional:      General: She is not in acute distress.    Appearance: She is well-developed.  HENT:     Head: Normocephalic and atraumatic.  Cardiovascular:     Rate and Rhythm: Normal rate and regular rhythm.     Pulses: Normal pulses.  Pulmonary:     Effort: Pulmonary effort is normal. No respiratory distress.  Abdominal:     Palpations: Abdomen is soft.     Tenderness: There is no abdominal tenderness.  Musculoskeletal:     Cervical back: Normal range of  motion.     Right lower leg: No edema.     Left lower leg: No edema.  Lymphadenopathy:     Cervical: No cervical adenopathy.  Skin:    General: Skin is warm and dry.     Findings: No rash.  Neurological:     Mental Status: She is alert and oriented to person, place, and time.  Psychiatric:        Mood and Affect: Mood normal.        Behavior: Behavior normal.    Wt Readings from Last 3 Encounters:  02/15/21 177 lb (80.3 kg)  01/28/21 172 lb 12.8 oz (78.4 kg)  01/01/21 182 lb (82.6 kg)    BP (!) 136/98   Pulse 73   Temp 98.5 F (36.9 C) (Oral)   Ht 5\' 7"  (1.702 m)   Wt 177 lb (80.3 kg)   SpO2 98%   BMI 27.72 kg/m   Assessment and Plan: 1. Acute cystitis without hematuria She requests a repeat UA to document clearing She denies symptoms Culture was negative UA today is negative.  2. 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HCC) Stable on current regimen On Reglan for recurrent n/v  3. Essential hypertension BP borderline elevated on losartan and HCTZ. Nephrology thinks that she is also taking Amlodipine but she not. She will discuss this with Dr. Holley Raring at her visit in a few weeks  4. Intractable vomiting with nausea Doing better on Reglan Seeing GI next week for follow up.   Partially dictated using Editor, commissioning. Any errors are unintentional.  Halina Maidens, MD [facility]  02/15/2021

## 2021-02-18 DIAGNOSIS — N184 Chronic kidney disease, stage 4 (severe): Secondary | ICD-10-CM | POA: Diagnosis not present

## 2021-02-18 DIAGNOSIS — Z6827 Body mass index (BMI) 27.0-27.9, adult: Secondary | ICD-10-CM | POA: Diagnosis not present

## 2021-02-18 DIAGNOSIS — R112 Nausea with vomiting, unspecified: Secondary | ICD-10-CM | POA: Diagnosis not present

## 2021-02-18 DIAGNOSIS — E1042 Type 1 diabetes mellitus with diabetic polyneuropathy: Secondary | ICD-10-CM | POA: Diagnosis not present

## 2021-02-26 DIAGNOSIS — Z79899 Other long term (current) drug therapy: Secondary | ICD-10-CM | POA: Diagnosis not present

## 2021-02-26 DIAGNOSIS — Z01818 Encounter for other preprocedural examination: Secondary | ICD-10-CM | POA: Diagnosis not present

## 2021-02-26 DIAGNOSIS — Z87891 Personal history of nicotine dependence: Secondary | ICD-10-CM | POA: Diagnosis not present

## 2021-02-26 DIAGNOSIS — Z789 Other specified health status: Secondary | ICD-10-CM | POA: Diagnosis not present

## 2021-02-26 DIAGNOSIS — Z531 Procedure and treatment not carried out because of patient's decision for reasons of belief and group pressure: Secondary | ICD-10-CM | POA: Diagnosis not present

## 2021-02-26 DIAGNOSIS — D631 Anemia in chronic kidney disease: Secondary | ICD-10-CM | POA: Diagnosis not present

## 2021-02-26 DIAGNOSIS — F419 Anxiety disorder, unspecified: Secondary | ICD-10-CM | POA: Diagnosis not present

## 2021-02-26 DIAGNOSIS — I120 Hypertensive chronic kidney disease with stage 5 chronic kidney disease or end stage renal disease: Secondary | ICD-10-CM | POA: Diagnosis not present

## 2021-02-26 DIAGNOSIS — I10 Essential (primary) hypertension: Secondary | ICD-10-CM | POA: Diagnosis not present

## 2021-02-26 DIAGNOSIS — N185 Chronic kidney disease, stage 5: Secondary | ICD-10-CM | POA: Diagnosis not present

## 2021-02-26 DIAGNOSIS — E1121 Type 2 diabetes mellitus with diabetic nephropathy: Secondary | ICD-10-CM | POA: Diagnosis not present

## 2021-02-26 DIAGNOSIS — Z7682 Awaiting organ transplant status: Secondary | ICD-10-CM | POA: Diagnosis not present

## 2021-02-26 DIAGNOSIS — N186 End stage renal disease: Secondary | ICD-10-CM | POA: Diagnosis not present

## 2021-02-26 DIAGNOSIS — Z1159 Encounter for screening for other viral diseases: Secondary | ICD-10-CM | POA: Diagnosis not present

## 2021-02-26 DIAGNOSIS — Z114 Encounter for screening for human immunodeficiency virus [HIV]: Secondary | ICD-10-CM | POA: Diagnosis not present

## 2021-02-26 DIAGNOSIS — F418 Other specified anxiety disorders: Secondary | ICD-10-CM | POA: Diagnosis not present

## 2021-02-26 LAB — HEMOGLOBIN A1C: Hemoglobin A1C: 7.8

## 2021-02-27 ENCOUNTER — Other Ambulatory Visit: Payer: Self-pay | Admitting: Internal Medicine

## 2021-02-27 DIAGNOSIS — F331 Major depressive disorder, recurrent, moderate: Secondary | ICD-10-CM

## 2021-02-27 DIAGNOSIS — IMO0001 Reserved for inherently not codable concepts without codable children: Secondary | ICD-10-CM | POA: Insufficient documentation

## 2021-02-27 DIAGNOSIS — Z531 Procedure and treatment not carried out because of patient's decision for reasons of belief and group pressure: Secondary | ICD-10-CM | POA: Insufficient documentation

## 2021-02-27 NOTE — Telephone Encounter (Signed)
   Notes to clinic:   The original prescription was discontinued on 11/09/2020 by Glean Hess, MD. Renewing this prescription may not be appropriate  Requested Prescriptions  Pending Prescriptions Disp Refills   escitalopram (LEXAPRO) 5 MG tablet [Pharmacy Med Name: ESCITALOPRAM 5 MG TABLET] 90 tablet 1    Sig: Take 1 tablet (5 mg total) by mouth daily.      Psychiatry:  Antidepressants - SSRI Passed - 02/27/2021 10:25 AM      Passed - Completed PHQ-2 or PHQ-9 in the last 360 days      Passed - Valid encounter within last 6 months    Recent Outpatient Visits           1 week ago Acute cystitis without hematuria   [facility] Glean Hess, MD   1 month ago [facility] Montel Culver, MD   1 month ago Acute cystitis without hematuria   [facility] Glean Hess, MD   3 months ago Essential hypertension   [facility] Glean Hess, MD   4 months ago Intractable vomiting with nausea   [facility] Glean Hess, MD                  ondansetron (ZOFRAN-ODT) 4 MG disintegrating tablet [Pharmacy Med Name: ONDANSETRON ODT 4 MG TABLET] 18 tablet 1    Sig: TAKE 1 TABLET (4 MG TOTAL) BY MOUTH AS NEEDED.      Not Delegated - Gastroenterology: Antiemetics Failed - 02/27/2021 10:25 AM      Failed - This refill cannot be delegated      Passed - Valid encounter within last 6 months    Recent Outpatient Visits           1 week ago Acute cystitis without hematuria   [facility] Glean Hess, MD   1 month ago [facility] Montel Culver, MD   1 month ago Acute cystitis without hematuria   [facility] Glean Hess, MD   3 months ago Essential hypertension   [place], [place]   4 months ago Intractable vomiting with nausea   [facility] Glean Hess, MD

## 2021-03-01 NOTE — Addendum Note (Signed)
Encounter addended by: Kathyrn Drown, RN on: 03/01/2021 4:21 PM  Actions taken: Charge Capture section accepted

## 2021-03-04 ENCOUNTER — Telehealth: Payer: Self-pay

## 2021-03-04 NOTE — Telephone Encounter (Signed)
Copied from [place] [id number]. Topic: Appointment Scheduling - Scheduling Inquiry for Clinic >> Mar 01, 2021  1:40 PM Wynetta Emery, [place] C wrote: Reason for CRM: pt called in to be advised. Pt says that she has to have surgery and was told that she has to have a pap smear. Pt would like to know if PCP does paps? Unsure, called office to be advised no answer. Please assist pt further.   CB: [phone]

## 2021-03-04 NOTE — Telephone Encounter (Signed)
Called patient and informed per her chart she has had a hysterectomy so she does not need a pap smear unless she still has a cervix. Told her to call if she has any further questions.

## 2021-03-04 NOTE — Addendum Note (Signed)
Encounter addended by: Kathyrn Drown, RN on: 03/04/2021 10:37 AM  Actions taken: MAR administration accepted

## 2021-03-11 ENCOUNTER — Other Ambulatory Visit: Payer: Self-pay | Admitting: Internal Medicine

## 2021-03-11 NOTE — Telephone Encounter (Signed)
Patient is calling to check on the status of the refill. Pt states that the original prescriber was a hospitalist.  Pt is request 25mg . Pt states that she halfs the pill per her instructions.  Please advise [phone]

## 2021-03-11 NOTE — Telephone Encounter (Signed)
   Notes to clinic:  [place] for refill  Requested Prescriptions  Pending Prescriptions Disp Refills   losartan (COZAAR) 50 MG tablet [Pharmacy Med Name: LOSARTAN POTASSIUM 50 MG TAB] 60 tablet     Sig: TAKE 2 TABLETS BY MOUTH EVERY DAY      Cardiovascular:  Angiotensin Receptor Blockers Failed - 03/11/2021 11:09 AM      Failed - Cr in normal range and within 180 days    Creatinine, Ser  Date Value Ref Range Status  01/25/2021 3.37 (H) 0.44 - 1.00 mg/dL Final          Failed - K in normal range and within 180 days    Potassium  Date Value Ref Range Status  01/25/2021 2.9 (L) 3.5 - 5.1 mmol/L Final          Failed - Last BP in normal range    BP Readings from Last 1 Encounters:  02/15/21 (!) 136/98          Passed - Patient is not pregnant      Passed - Valid encounter within last 6 months    Recent Outpatient Visits           3 weeks ago Acute cystitis without hematuria   [facility] Glean Hess, MD   1 month ago [facility] Montel Culver, MD   2 months ago Acute cystitis without hematuria   [facility] Glean Hess, MD   4 months ago Essential hypertension   [facility] Glean Hess, MD   5 months ago Intractable vomiting with nausea   [facility] Glean Hess, MD

## 2021-03-29 ENCOUNTER — Other Ambulatory Visit: Payer: Self-pay

## 2021-03-29 ENCOUNTER — Ambulatory Visit
Admission: RE | Admit: 2021-03-29 | Discharge: 2021-03-29 | Disposition: A | Discharge status: Home / Self Care | Payer: BC Managed Care – PPO | Source: Ambulatory Visit | Attending: Nephrology | Admitting: Nephrology

## 2021-03-29 DIAGNOSIS — E860 Dehydration: Secondary | ICD-10-CM | POA: Diagnosis not present

## 2021-03-29 DIAGNOSIS — N184 Chronic kidney disease, stage 4 (severe): Secondary | ICD-10-CM | POA: Diagnosis not present

## 2021-03-29 MED ORDER — SODIUM CHLORIDE 0.9 % IV SOLN: Freq: Once | INTRAVENOUS | Status: DC

## 2021-03-29 MED ORDER — SODIUM CHLORIDE 0.9 % IV SOLN
Freq: Once | INTRAVENOUS | Status: AC
  Administered 2021-03-29: 625 mL via INTRAVENOUS

## 2021-03-30 DIAGNOSIS — F419 Anxiety disorder, unspecified: Secondary | ICD-10-CM | POA: Diagnosis not present

## 2021-03-30 DIAGNOSIS — E785 Hyperlipidemia, unspecified: Secondary | ICD-10-CM | POA: Diagnosis not present

## 2021-03-30 DIAGNOSIS — F329 Major depressive disorder, single episode, unspecified: Secondary | ICD-10-CM | POA: Diagnosis not present

## 2021-03-30 DIAGNOSIS — K625 Hemorrhage of anus and rectum: Secondary | ICD-10-CM | POA: Diagnosis not present

## 2021-03-30 DIAGNOSIS — I129 Hypertensive chronic kidney disease with stage 1 through stage 4 chronic kidney disease, or unspecified chronic kidney disease: Secondary | ICD-10-CM | POA: Diagnosis not present

## 2021-03-30 DIAGNOSIS — N179 Acute kidney failure, unspecified: Secondary | ICD-10-CM | POA: Diagnosis not present

## 2021-03-30 DIAGNOSIS — R197 Diarrhea, unspecified: Secondary | ICD-10-CM | POA: Diagnosis not present

## 2021-03-30 DIAGNOSIS — R112 Nausea with vomiting, unspecified: Secondary | ICD-10-CM | POA: Diagnosis not present

## 2021-03-30 DIAGNOSIS — N189 Chronic kidney disease, unspecified: Secondary | ICD-10-CM | POA: Diagnosis not present

## 2021-03-30 DIAGNOSIS — R059 Cough, unspecified: Secondary | ICD-10-CM | POA: Diagnosis not present

## 2021-03-30 DIAGNOSIS — R1030 Lower abdominal pain, unspecified: Secondary | ICD-10-CM | POA: Diagnosis not present

## 2021-03-30 DIAGNOSIS — E1022 Type 1 diabetes mellitus with diabetic chronic kidney disease: Secondary | ICD-10-CM | POA: Diagnosis not present

## 2021-03-31 DIAGNOSIS — E7800 Pure hypercholesterolemia, unspecified: Secondary | ICD-10-CM | POA: Diagnosis not present

## 2021-03-31 DIAGNOSIS — E1122 Type 2 diabetes mellitus with diabetic chronic kidney disease: Secondary | ICD-10-CM | POA: Diagnosis not present

## 2021-03-31 DIAGNOSIS — E1042 Type 1 diabetes mellitus with diabetic polyneuropathy: Secondary | ICD-10-CM | POA: Diagnosis not present

## 2021-03-31 DIAGNOSIS — E1143 Type 2 diabetes mellitus with diabetic autonomic (poly)neuropathy: Secondary | ICD-10-CM | POA: Diagnosis not present

## 2021-03-31 DIAGNOSIS — Z20822 Contact with and (suspected) exposure to covid-19: Secondary | ICD-10-CM | POA: Diagnosis not present

## 2021-03-31 DIAGNOSIS — N179 Acute kidney failure, unspecified: Secondary | ICD-10-CM | POA: Diagnosis not present

## 2021-03-31 DIAGNOSIS — R1115 Cyclical vomiting syndrome unrelated to migraine: Secondary | ICD-10-CM | POA: Diagnosis not present

## 2021-03-31 DIAGNOSIS — E1022 Type 1 diabetes mellitus with diabetic chronic kidney disease: Secondary | ICD-10-CM | POA: Diagnosis not present

## 2021-03-31 DIAGNOSIS — Z880 Allergy status to penicillin: Secondary | ICD-10-CM | POA: Diagnosis not present

## 2021-03-31 DIAGNOSIS — E1142 Type 2 diabetes mellitus with diabetic polyneuropathy: Secondary | ICD-10-CM | POA: Diagnosis not present

## 2021-03-31 DIAGNOSIS — F418 Other specified anxiety disorders: Secondary | ICD-10-CM | POA: Diagnosis not present

## 2021-03-31 DIAGNOSIS — N184 Chronic kidney disease, stage 4 (severe): Secondary | ICD-10-CM | POA: Diagnosis not present

## 2021-03-31 DIAGNOSIS — Z794 Long term (current) use of insulin: Secondary | ICD-10-CM | POA: Diagnosis not present

## 2021-03-31 DIAGNOSIS — E876 Hypokalemia: Secondary | ICD-10-CM | POA: Diagnosis not present

## 2021-03-31 DIAGNOSIS — I129 Hypertensive chronic kidney disease with stage 1 through stage 4 chronic kidney disease, or unspecified chronic kidney disease: Secondary | ICD-10-CM | POA: Diagnosis not present

## 2021-03-31 DIAGNOSIS — Z87891 Personal history of nicotine dependence: Secondary | ICD-10-CM | POA: Diagnosis not present

## 2021-03-31 DIAGNOSIS — R112 Nausea with vomiting, unspecified: Secondary | ICD-10-CM | POA: Diagnosis not present

## 2021-03-31 DIAGNOSIS — E119 Type 2 diabetes mellitus without complications: Secondary | ICD-10-CM | POA: Diagnosis not present

## 2021-03-31 DIAGNOSIS — K3184 Gastroparesis: Secondary | ICD-10-CM | POA: Diagnosis not present

## 2021-03-31 DIAGNOSIS — Z6826 Body mass index (BMI) 26.0-26.9, adult: Secondary | ICD-10-CM | POA: Diagnosis not present

## 2021-03-31 DIAGNOSIS — Z8619 Personal history of other infectious and parasitic diseases: Secondary | ICD-10-CM | POA: Diagnosis not present

## 2021-03-31 DIAGNOSIS — R197 Diarrhea, unspecified: Secondary | ICD-10-CM | POA: Diagnosis not present

## 2021-04-02 ENCOUNTER — Telehealth: Payer: Self-pay

## 2021-04-02 NOTE — Telephone Encounter (Signed)
Copied from [place] [phone]. Topic: General - Other >> Apr 02, 2021 11:54 AM Oneta Rack wrote: Patient spouse is requesting PCP fill out a LOA for his employer due to patient medical condition and him being her sole care giver. Patient spouse states his job works off a  point system and when his wife doesn't feel well he has to leave work and points are applied.  Caller will be faxing over form to [phone], please note when received. Caller would like a follow up call [phone] >> Apr 02, 2021  2:01 PM Oneta Rack wrote: Left a voicemail advising Mr. Sans of the status of form.  >> Apr 02, 2021  2:00 PM Oneta Rack wrote: Practice confirmed LOA form was received and will forward to PCP. PCP is out of the office until Monday 04/08/2021, PCP nurse will follow up when PCP returns.

## 2021-04-04 ENCOUNTER — Telehealth: Payer: Self-pay | Admitting: Internal Medicine

## 2021-04-04 ENCOUNTER — Other Ambulatory Visit: Payer: Self-pay | Admitting: Internal Medicine

## 2021-04-04 ENCOUNTER — Telehealth: Payer: Self-pay

## 2021-04-04 NOTE — Telephone Encounter (Signed)
Called pt left a VM that we could fit her in for a CPE but the things that she needs we may not be able to refer her out to get the other things she may need done by 8/26/ Told pt to call the kidney coordinator and see if there is anything they can do to help move the process along faster for her to be able to get some of the required things done as well.  PEC nurse may give results to patient if they return call to clinic, a CRM has been created.   KP

## 2021-04-04 NOTE — Telephone Encounter (Signed)
Copied from [place] [id number]. Topic: Appointment Scheduling - Scheduling Inquiry for Clinic >> Apr 04, 2021  9:52 AM Yvette Rack wrote: Reason for CRM: Pt requests hospital fu appt but the next available appt is 05/01/21 at 2 pm. Pt requests call back for sooner appointment.   Called patient to set up hospital follow up but also mention that she may not need a follow up if she's not feeling better. Patient stated that her GI doctor said that Dr Army Melia can push her appointment sooner with her GI doctor to be seen. I spoke with chassidy and Jacqulyn Bath said that we can not push her appointments with her GI doctor.

## 2021-04-04 NOTE — Telephone Encounter (Signed)
Pt is calling to see if she can get a CPE for a kidney transplant. Pt will be seeing her Kidney Coordinator on April 19, 2021 and is hoping to have test completed prior to this. CB- [phone]

## 2021-04-04 NOTE — Telephone Encounter (Signed)
Requested medication (s) are due for refill today: see note  Requested medication (s) are on the active medication list: yes  Last refill:  03/11/21 #90 0 refills  Future visit scheduled: no  Notes to clinic:  order for #90, patient taking 4 tabs of 25 mg daily . Do you want to order #120 0 refills?     Requested Prescriptions  Pending Prescriptions Disp Refills   losartan (COZAAR) 25 MG tablet [Pharmacy Med Name: LOSARTAN POTASSIUM 25 MG TAB] 90 tablet 0    Sig: TAKE 4 TABLETS BY MOUTH DAILY.     Cardiovascular:  Angiotensin Receptor Blockers Failed - 04/04/2021  2:37 PM      Failed - Cr in normal range and within 180 days    Creatinine, Ser  Date Value Ref Range Status  01/25/2021 3.37 (H) 0.44 - 1.00 mg/dL Final          Failed - K in normal range and within 180 days    Potassium  Date Value Ref Range Status  01/25/2021 2.9 (L) 3.5 - 5.1 mmol/L Final          Failed - Last BP in normal range    BP Readings from Last 1 Encounters:  02/15/21 (!) 136/98          Passed - Patient is not pregnant      Passed - Valid encounter within last 6 months    Recent Outpatient Visits           1 month ago Acute cystitis without hematuria   [facility] Glean Hess, MD   2 months ago [facility] Montel Culver, MD   3 months ago Acute cystitis without hematuria   [facility] Glean Hess, MD   4 months ago Essential hypertension   [facility] Glean Hess, MD   5 months ago Intractable vomiting with nausea   [facility] Glean Hess, MD

## 2021-04-05 ENCOUNTER — Other Ambulatory Visit: Payer: Self-pay | Admitting: Internal Medicine

## 2021-04-05 MED ORDER — LOSARTAN POTASSIUM 100 MG PO TABS: 100.0000 mg | ORAL_TABLET | Freq: Every day | ORAL | 0 refills | Status: DC

## 2021-04-05 NOTE — Telephone Encounter (Signed)
Please review [facility] note from 02/26/2021.Spoke to pt told her that she is scheduled for 8/17 @ 3:40 PM. Pt verbalized understanding.  KP

## 2021-04-05 NOTE — Telephone Encounter (Signed)
Pt called stating that she received a call. Attempted to get pt to nurse to get her info, and she refused to be placed on hold. Pt is requesting to have a call back with info. Please advise.

## 2021-04-05 NOTE — Telephone Encounter (Signed)
done

## 2021-04-05 NOTE — Telephone Encounter (Signed)
Called pt let her know 100 MG was sent in. Only take 1 tablet daily. Pt verbalized understanding.  KP

## 2021-04-05 NOTE — Telephone Encounter (Signed)
Please review.  KP

## 2021-04-08 ENCOUNTER — Other Ambulatory Visit: Payer: Self-pay | Admitting: Internal Medicine

## 2021-04-08 NOTE — Telephone Encounter (Signed)
  Notes to clinic:  Per patient new script needs to be sent for pharmacy to fill    Requested Prescriptions  Pending Prescriptions Disp Refills   carvedilol (COREG) 12.5 MG tablet      Sig: Take 1 tablet (12.5 mg total) by mouth 2 (two) times daily.     Cardiovascular:  Beta Blockers Failed - 04/08/2021  1:21 PM      Failed - Last BP in normal range    BP Readings from Last 1 Encounters:  02/15/21 (!) 136/98          Passed - Last Heart Rate in normal range    Pulse Readings from Last 1 Encounters:  02/15/21 73          Passed - Valid encounter within last 6 months    Recent Outpatient Visits           1 month ago Acute cystitis without hematuria   [facility] Glean Hess, MD   2 months ago [facility] Montel Culver, MD   3 months ago Acute cystitis without hematuria   [facility] Glean Hess, MD   5 months ago Essential hypertension   [place], [place]   5 months ago Intractable vomiting with nausea   [facility] Glean Hess, MD       Future Appointments             In 2 days Glean Hess, MD [facility]

## 2021-04-08 NOTE — Telephone Encounter (Signed)
Patient states pharmacy will not refill carvedilol (COREG) 12.5 MG tablet unless PCP sends a new rx. Patient states she is out.   [id number] - [address], [place] Phone:  [phone]  Fax:  [phone]

## 2021-04-09 ENCOUNTER — Other Ambulatory Visit: Payer: Self-pay

## 2021-04-09 MED ORDER — CARVEDILOL 12.5 MG PO TABS: 12.5000 mg | ORAL_TABLET | Freq: Two times a day (BID) | ORAL | 5 refills | Status: DC

## 2021-04-09 NOTE — Telephone Encounter (Signed)
Spoke with Adonis Huguenin at [facility] who stated carvedilol 12.5 twice daily was last prescribed by Albina Billet 01/22/21.

## 2021-04-09 NOTE — Telephone Encounter (Signed)
Refill if appropriate.  Please advise. See below.

## 2021-04-09 NOTE — Telephone Encounter (Signed)
Please review.  KP

## 2021-04-10 ENCOUNTER — Other Ambulatory Visit: Payer: Self-pay

## 2021-04-10 ENCOUNTER — Encounter: Payer: Self-pay | Admitting: Internal Medicine

## 2021-04-10 ENCOUNTER — Ambulatory Visit (INDEPENDENT_AMBULATORY_CARE_PROVIDER_SITE_OTHER): Payer: BC Managed Care – PPO | Admitting: Internal Medicine

## 2021-04-10 VITALS — BP 140/78 | HR 74 | Temp 98.2°F | Ht 67.0 in | Wt 176.0 lb

## 2021-04-10 DIAGNOSIS — I10 Essential (primary) hypertension: Secondary | ICD-10-CM

## 2021-04-10 DIAGNOSIS — E1042 Type 1 diabetes mellitus with diabetic polyneuropathy: Secondary | ICD-10-CM

## 2021-04-10 DIAGNOSIS — Z0000 Encounter for general adult medical examination without abnormal findings: Secondary | ICD-10-CM

## 2021-04-10 DIAGNOSIS — R112 Nausea with vomiting, unspecified: Secondary | ICD-10-CM

## 2021-04-10 DIAGNOSIS — Z1231 Encounter for screening mammogram for malignant neoplasm of breast: Secondary | ICD-10-CM | POA: Diagnosis not present

## 2021-04-10 DIAGNOSIS — E782 Mixed hyperlipidemia: Secondary | ICD-10-CM | POA: Diagnosis not present

## 2021-04-10 DIAGNOSIS — Z1211 Encounter for screening for malignant neoplasm of colon: Secondary | ICD-10-CM

## 2021-04-10 NOTE — Progress Notes (Signed)
Date:  04/10/2021   Name:  Kelli [name]   DOB:  May 30, 1961   MRN:  [id number]   Chief Complaint: Annual Exam (Breast exam no pap) Kelli [name] is a 60 y.o. female who presents today for her Complete Annual Exam. She feels fairly well. She reports exercising none. She reports she is sleeping fairly well. Breast complaints none.  Mammogram: 07/2020 DEXA: none Pap smear: discontinued Colonoscopy: 12/2015 scheduled for for October with EGD  Immunization History  Administered Date(s) Administered   Influenza Whole 10/09/2010   Influenza-Unspecified 06/03/2017, 07/05/2018, 04/29/2020   Moderna Sars-Covid-2 Vaccination 10/24/2019, 11/24/2019   Pneumococcal Polysaccharide-23 06/28/2014   Tdap 12/28/2014   Zoster Recombinat (Shingrix) 04/19/2020, 07/13/2020    HPI  Lab Results  Component Value Date   CREATININE 3.37 (H) 01/25/2021   BUN 42 (H) 01/25/2021   NA 131 (L) 01/25/2021   K 2.9 (L) 01/25/2021   CL 94 (L) 01/25/2021   CO2 28 01/25/2021   Lab Results  Component Value Date   CHOL 226 (H) 12/08/2019   HDL 45 12/08/2019   LDLCALC 132 (H) 12/08/2019   TRIG 245 (H) 12/08/2019   CHOLHDL 5.0 12/08/2019   Lab Results  Component Value Date   TSH 1.794 09/02/2018   Lab Results  Component Value Date   HGBA1C 7.8 02/26/2021   Lab Results  Component Value Date   WBC 7.0 09/02/2018   HGB 11.7 (L) 09/02/2018   HCT 36.3 09/02/2018   MCV 87.1 09/02/2018   PLT 347 09/02/2018   Lab Results  Component Value Date   ALT 15 05/30/2020   AST 19 05/30/2020   ALKPHOS 67 05/30/2020   BILITOT 0.5 05/30/2020     Review of Systems  Constitutional:  Negative for chills, fatigue and fever.  HENT:  Negative for congestion, hearing loss, tinnitus, trouble swallowing and voice change.   Eyes:  Positive for visual disturbance.  Respiratory:  Negative for cough, chest tightness, shortness of breath and wheezing.   Cardiovascular:  Negative for chest pain, palpitations and leg  swelling.  Gastrointestinal:  Positive for nausea and vomiting. Negative for abdominal pain, constipation and diarrhea.  Endocrine: Negative for polydipsia and polyuria.  Genitourinary:  Negative for dysuria, frequency, genital sores, vaginal bleeding and vaginal discharge.  Musculoskeletal:  Negative for arthralgias, gait problem and joint swelling.  Skin:  Negative for color change and rash.  Neurological:  Negative for dizziness, tremors, light-headedness and headaches.  Hematological:  Negative for adenopathy. Does not bruise/bleed easily.  Psychiatric/Behavioral:  Negative for dysphoric mood and sleep disturbance. The patient is not nervous/anxious.    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, left eye, 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   Neovascular glaucoma of left eye, 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   Mixed hyperlipidemia 06/30/2019   Hypomagnesemia 06/30/2019   Microalbuminuria 09/24/2018   Neuropathy of left foot 03/18/2017   Arthrosis of midfoot, left 03/18/2017   Charcot foot due to diabetes mellitus ([place]) 02/09/2017   Venous insufficiency of both lower extremities 02/09/2017   Chronic idiopathic constipation 01/26/2017   Intractable vomiting with nausea 01/26/2017  Abnormal thyroid blood test 09/30/2016   Essential hypertension 08/21/2016   Adrenal mass ([place]) 08/18/2016   Temporomandibular joint pain 11/02/2014   Moderate episode of recurrent major depressive disorder ([place]) 11/02/2014   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[place]) 06/28/2014   Eustachian tube dysfunction 05/10/2014    Hyperlipidemia 10/03/2013   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   Vitamin D deficiency 12/30/2012   Corns and callosity 07/09/2012   Hammer toe, acquired 07/09/2012    Allergies  Allergen Reactions   Statins Nausea And Vomiting   Gabapentin Swelling   Hydrochlorothiazide Nausea And Vomiting   Lisinopril Other (See Comments)    fatigue   Lyrica [Pregabalin] Swelling   Metformin And Related Nausea And Vomiting   Penicillins Nausea And Vomiting    Past Surgical History:  Procedure Laterality Date   ABDOMINAL HYSTERECTOMY     COLONOSCOPY W/ POLYPECTOMY     ESOPHAGOGASTRODUODENOSCOPY  03/02/2017   Normal   HEMORRHOID SURGERY      Social History   Tobacco Use   Smoking status: Former   Smokeless tobacco: Never  Vaping Use   Vaping Use: Never used  Substance Use Topics   Alcohol use: No   Drug use: No     Medication list has been reviewed and updated.  Current Meds  Medication Sig   amLODipine (NORVASC) 5 MG tablet Take 5 mg by mouth daily.   BD PEN NEEDLE NANO U/F 32G X 4 MM MISC Inject 1 each into the skin 3 (three) times daily.   carvedilol (COREG) 12.5 MG tablet Take 1 tablet (12.5 mg total) by mouth 2 (two) times daily.   chlorthalidone (HYGROTON) 25 MG tablet Take 12.5 mg by mouth daily.   Continuous Blood Gluc Receiver (FREESTYLE LIBRE 14 DAY READER) DEVI See admin instructions.   Continuous Blood Gluc Sensor (FREESTYLE LIBRE 14 DAY SENSOR) MISC USE AS DIRECTED EVERY 14 DAYS   escitalopram (LEXAPRO) 10 MG tablet Take 5 mg by mouth daily.   glucose blood (FREESTYLE LITE) test strip 1 each by Other route five (5) times a day.   insulin glargine (LANTUS) 100 UNIT/ML Solostar Pen Inject into the skin.   insulin lispro (HUMALOG) 100 UNIT/ML injection Inject into the skin 3 (three) times daily before meals. SSI   LORazepam (ATIVAN) 0.5 MG tablet Take by mouth.   losartan (COZAAR) 100 MG tablet Take 1 tablet (100 mg total) by mouth daily.    metoCLOPramide (REGLAN) 10 MG tablet Take by mouth.   mirtazapine (REMERON) 7.5 MG tablet    mupirocin ointment (BACTROBAN) 2 % Apply 1 application topically 2 (two) times daily.   ondansetron (ZOFRAN-ODT) 4 MG disintegrating tablet TAKE 1 TABLET (4 MG TOTAL) BY MOUTH AS NEEDED.   pantoprazole (PROTONIX) 40 MG tablet Take 40 mg by mouth daily.   senna (SENOKOT) 8.6 MG tablet Take 1 tablet by mouth daily.   [DISCONTINUED] dorzolamide-timolol (COSOPT) 22.3-6.8 MG/ML ophthalmic solution Place 1 drop into the left eye 2 (two) times daily.   [DISCONTINUED] escitalopram (LEXAPRO) 5 MG tablet TAKE 1 TABLET (5 MG TOTAL) BY MOUTH DAILY.    PHQ 2/9 Scores 04/10/2021 02/15/2021 01/28/2021 01/01/2021  PHQ - 2 Score 1 0 6 0  PHQ- 9 Score 4 0 23 0    GAD 7 : Generalized Anxiety Score 04/10/2021 02/15/2021 01/28/2021 01/01/2021  Nervous, Anxious, on Edge 0 0 3 0  Control/stop worrying 0 0 1 0  Worry too much - different things 0 0 0 0  Trouble relaxing 0 0 3 0  Restless 0 0 0 0  Easily annoyed or irritable 0 0 0 0  Afraid - awful might happen 0 0 0 0  Total GAD 7 Score 0 0 7 0  Anxiety Difficulty Not difficult at all - Very difficult -    BP Readings from Last 3 Encounters:  04/10/21 140/78  02/15/21 (!) 136/98  01/28/21 124/68    Physical Exam Vitals and nursing note reviewed.  Constitutional:      General: She is not in acute distress.    Appearance: Normal appearance. She is well-developed.     Comments: Actively vomiting  HENT:     Head: Normocephalic and atraumatic.     Right Ear: Tympanic membrane and ear canal normal.     Left Ear: Tympanic membrane and ear canal normal.     Nose:     Right Sinus: No maxillary sinus tenderness.     Left Sinus: No maxillary sinus tenderness.  Eyes:     General: No scleral icterus.       Right eye: No discharge.        Left eye: No discharge.     Conjunctiva/sclera: Conjunctivae normal.  Neck:     Thyroid: No thyromegaly.     Vascular: No carotid  bruit.  Cardiovascular:     Rate and Rhythm: Normal rate and regular rhythm.     Pulses: Normal pulses.     Heart sounds: Normal heart sounds.  Pulmonary:     Effort: Pulmonary effort is normal. No respiratory distress.     Breath sounds: No wheezing.  Chest:  Breasts:    Right: No mass, nipple discharge, skin change or tenderness.     Left: No mass, nipple discharge, skin change or tenderness.  Abdominal:     General: Bowel sounds are normal.     Palpations: Abdomen is soft.     Tenderness: There is no abdominal tenderness.  Musculoskeletal:     Cervical back: Normal range of motion. No erythema.     Right lower leg: No edema.     Left lower leg: No edema.  Lymphadenopathy:     Cervical: No cervical adenopathy.  Skin:    General: Skin is warm and dry.     Capillary Refill: Capillary refill takes less than 2 seconds.     Findings: No rash.  Neurological:     Mental Status: She is alert and oriented to person, place, and time.     Cranial Nerves: No cranial nerve deficit.     Sensory: Sensory deficit (in both feet) present.     Deep Tendon Reflexes: Reflexes are normal and symmetric.  Psychiatric:        Attention and Perception: Attention normal.        Mood and Affect: Mood normal.    Wt Readings from Last 3 Encounters:  04/10/21 176 lb (79.8 kg)  02/15/21 177 lb (80.3 kg)  01/28/21 172 lb 12.8 oz (78.4 kg)    BP 140/78   Pulse 74   Temp 98.2 F (36.8 C) (Oral)   Ht 5\' 7"  (1.702 m)   Wt 176 lb (79.8 kg)   SpO2 98%   BMI 27.57 kg/m   Assessment and Plan: 1. Annual physical exam Exam done in anticipation of renal transplant. Immunizations are up to date for age  49. Encounter for screening mammogram for breast cancer Mammogram is up to date; will repeat in 07/2021  3. Colon cancer screening  EGD and Colonoscopy is scheduled for October.  4. Essential hypertension Clinically stable exam with well controlled BP. Tolerating medications without side effects  at this time. Pt to continue current regimen and low sodium diet; benefits of regular exercise as able discussed.  5. 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HCC) Stable BS with improved A1C - down to 7.8 Being [facility] by [facility] with basal-bolus insulin. She monitors her BS regularly using Freestyle Libre - Basic metabolic panel  6. Mixed hyperlipidemia Unable to tolerated statins due to myalgia and n/v - Lipid panel  7. Intractable vomiting with nausea, unspecified vomiting type Continues to have episodic vomiting with chronic nausea No cause found GI involved; she is taking Reglan and initially had good results.  Her Husband Zanyla Klebba is requesting FMLA forms to help care for her - she needs transportation to most appointments due to vision impairment and frequent ED visits.  Partially dictated using Editor, commissioning. Any errors are unintentional.  Halina Maidens, MD [facility]  04/10/2021

## 2021-04-11 LAB — LIPID PANEL
Chol/HDL Ratio: 4.2 ratio (ref 0.0–4.4)
Cholesterol, Total: 259 mg/dL — ABNORMAL HIGH (ref 100–199)
HDL: 62 mg/dL (ref >39)
LDL Chol Calc (NIH): 168 mg/dL — ABNORMAL HIGH (ref 0–99)
Triglycerides: 162 mg/dL — ABNORMAL HIGH (ref 0–149)
VLDL Cholesterol Cal: 29 mg/dL (ref 5–40)

## 2021-04-11 LAB — BASIC METABOLIC PANEL
BUN/Creatinine Ratio: 10 — ABNORMAL LOW (ref 12–28)
BUN: 40 mg/dL — ABNORMAL HIGH (ref 8–27)
CO2: 24 mmol/L (ref 20–29)
Calcium: 8.3 mg/dL — ABNORMAL LOW (ref 8.7–10.3)
Chloride: 103 mmol/L (ref 96–106)
Creatinine, Ser: 4.13 mg/dL — ABNORMAL HIGH (ref 0.57–1.00)
Glucose: 168 mg/dL — ABNORMAL HIGH (ref 65–99)
Potassium: 3.8 mmol/L (ref 3.5–5.2)
Sodium: 140 mmol/L (ref 134–144)
eGFR: 12 mL/min/{1.73_m2} — ABNORMAL LOW (ref >59)

## 2021-04-24 ENCOUNTER — Telehealth: Payer: Self-pay

## 2021-04-24 DIAGNOSIS — R112 Nausea with vomiting, unspecified: Secondary | ICD-10-CM | POA: Diagnosis not present

## 2021-04-24 DIAGNOSIS — Z79899 Other long term (current) drug therapy: Secondary | ICD-10-CM | POA: Diagnosis not present

## 2021-04-24 DIAGNOSIS — I120 Hypertensive chronic kidney disease with stage 5 chronic kidney disease or end stage renal disease: Secondary | ICD-10-CM | POA: Diagnosis not present

## 2021-04-24 DIAGNOSIS — K5900 Constipation, unspecified: Secondary | ICD-10-CM | POA: Diagnosis not present

## 2021-04-24 DIAGNOSIS — R531 Weakness: Secondary | ICD-10-CM | POA: Diagnosis not present

## 2021-04-24 DIAGNOSIS — Z20822 Contact with and (suspected) exposure to covid-19: Secondary | ICD-10-CM | POA: Diagnosis not present

## 2021-04-24 DIAGNOSIS — E1165 Type 2 diabetes mellitus with hyperglycemia: Secondary | ICD-10-CM | POA: Diagnosis not present

## 2021-04-24 DIAGNOSIS — Z5181 Encounter for therapeutic drug level monitoring: Secondary | ICD-10-CM | POA: Diagnosis not present

## 2021-04-24 DIAGNOSIS — Z87891 Personal history of nicotine dependence: Secondary | ICD-10-CM | POA: Diagnosis not present

## 2021-04-24 DIAGNOSIS — D3501 Benign neoplasm of right adrenal gland: Secondary | ICD-10-CM | POA: Diagnosis not present

## 2021-04-24 DIAGNOSIS — N185 Chronic kidney disease, stage 5: Secondary | ICD-10-CM | POA: Diagnosis not present

## 2021-04-24 DIAGNOSIS — Z794 Long term (current) use of insulin: Secondary | ICD-10-CM | POA: Diagnosis not present

## 2021-04-24 DIAGNOSIS — K449 Diaphragmatic hernia without obstruction or gangrene: Secondary | ICD-10-CM | POA: Diagnosis not present

## 2021-04-24 DIAGNOSIS — E1122 Type 2 diabetes mellitus with diabetic chronic kidney disease: Secondary | ICD-10-CM | POA: Diagnosis not present

## 2021-04-24 DIAGNOSIS — R1013 Epigastric pain: Secondary | ICD-10-CM | POA: Diagnosis not present

## 2021-04-24 NOTE — Telephone Encounter (Signed)
Copied from [place] [id number]. Topic: Referral - Request for Referral >> Apr 24, 2021  1:30 PM Yvette Rack wrote: Has patient seen PCP for this complaint? Yes.   *If NO, is insurance requiring patient see PCP for this issue before PCP can refer them? Referral for which specialty: GI Preferred provider/office: Pt requests referral to a location in [place] or [place] Reason for referral: still having issues with vomiting

## 2021-04-24 NOTE — Telephone Encounter (Signed)
Reached out to [place] to see If she could get a sooner appt for pt.  KP

## 2021-04-25 DIAGNOSIS — R531 Weakness: Secondary | ICD-10-CM | POA: Diagnosis not present

## 2021-04-25 DIAGNOSIS — R112 Nausea with vomiting, unspecified: Secondary | ICD-10-CM | POA: Diagnosis not present

## 2021-04-26 NOTE — Telephone Encounter (Signed)
Informed pt that there was not a sooner appt. Pt verbalized understanding.  KP

## 2021-04-26 NOTE — Telephone Encounter (Signed)
Referrals team tried to get a sooner appt. Pt is not able to be seen any sooner.  KP

## 2021-05-01 ENCOUNTER — Ambulatory Visit: Payer: Self-pay | Admitting: *Deleted

## 2021-05-01 NOTE — Telephone Encounter (Signed)
Patient's husband is calling to report patient has elevated BP- patient had vomiting earlier. He wants to know if patient can take another BP pill- he feels she may have lost her medication when she vomited. Advised UC - due to late hour of day and per protocol.

## 2021-05-01 NOTE — Telephone Encounter (Signed)
Reason for Disposition  [9] Systolic BP  >= 558 OR Diastolic >= 316 AND [7] having NO cardiac or neurologic symptoms  Answer Assessment - Initial Assessment Questions 1. BLOOD PRESSURE: "What is the blood pressure?" "Did you take at least two measurements 5 minutes apart?"     217/109, 212/106 P 92 2. ONSET: "When did you take your blood pressure?"     4:00, 4:30 3. HOW: "How did you obtain the blood pressure?" (e.g., visiting nurse, automatic home BP monitor)     Automatic cuff 4. HISTORY: "Do you have a history of high blood pressure?"     yes 5. MEDICATIONS: "Are you taking any medications for blood pressure?" "Have you missed any doses recently?"     Yes- no missed doses- can patient take another Losartan  6. OTHER SYMPTOMS: "Do you have any symptoms?" (e.g., headache, chest pain, blurred vision, difficulty breathing, weakness)     Nausea- vomiting 7. PREGNANCY: "Is there any chance you are pregnant?" "When was your last menstrual period?"     N/a  Protocols used: Blood Pressure - High-A-AH

## 2021-05-02 NOTE — Telephone Encounter (Signed)
Called to check on pt to see if she went to UC. Left VM to call back.   KP

## 2021-05-03 ENCOUNTER — Ambulatory Visit: Payer: Self-pay | Admitting: *Deleted

## 2021-05-03 ENCOUNTER — Telehealth: Payer: Self-pay | Admitting: Internal Medicine

## 2021-05-03 DIAGNOSIS — E1122 Type 2 diabetes mellitus with diabetic chronic kidney disease: Secondary | ICD-10-CM | POA: Diagnosis not present

## 2021-05-03 DIAGNOSIS — R112 Nausea with vomiting, unspecified: Secondary | ICD-10-CM | POA: Diagnosis not present

## 2021-05-03 DIAGNOSIS — I120 Hypertensive chronic kidney disease with stage 5 chronic kidney disease or end stage renal disease: Secondary | ICD-10-CM | POA: Diagnosis not present

## 2021-05-03 DIAGNOSIS — Z880 Allergy status to penicillin: Secondary | ICD-10-CM | POA: Diagnosis not present

## 2021-05-03 DIAGNOSIS — I499 Cardiac arrhythmia, unspecified: Secondary | ICD-10-CM | POA: Diagnosis not present

## 2021-05-03 DIAGNOSIS — Z6827 Body mass index (BMI) 27.0-27.9, adult: Secondary | ICD-10-CM | POA: Diagnosis not present

## 2021-05-03 DIAGNOSIS — R109 Unspecified abdominal pain: Secondary | ICD-10-CM | POA: Diagnosis not present

## 2021-05-03 DIAGNOSIS — E785 Hyperlipidemia, unspecified: Secondary | ICD-10-CM | POA: Diagnosis not present

## 2021-05-03 DIAGNOSIS — Z87891 Personal history of nicotine dependence: Secondary | ICD-10-CM | POA: Diagnosis not present

## 2021-05-03 DIAGNOSIS — Z794 Long term (current) use of insulin: Secondary | ICD-10-CM | POA: Diagnosis not present

## 2021-05-03 DIAGNOSIS — N185 Chronic kidney disease, stage 5: Secondary | ICD-10-CM | POA: Diagnosis not present

## 2021-05-03 DIAGNOSIS — E7800 Pure hypercholesterolemia, unspecified: Secondary | ICD-10-CM | POA: Diagnosis not present

## 2021-05-03 DIAGNOSIS — E1165 Type 2 diabetes mellitus with hyperglycemia: Secondary | ICD-10-CM | POA: Diagnosis not present

## 2021-05-03 DIAGNOSIS — Z20822 Contact with and (suspected) exposure to covid-19: Secondary | ICD-10-CM | POA: Diagnosis not present

## 2021-05-03 DIAGNOSIS — E1140 Type 2 diabetes mellitus with diabetic neuropathy, unspecified: Secondary | ICD-10-CM | POA: Diagnosis not present

## 2021-05-03 DIAGNOSIS — Z79899 Other long term (current) drug therapy: Secondary | ICD-10-CM | POA: Diagnosis not present

## 2021-05-03 NOTE — Telephone Encounter (Signed)
Noted  KP 

## 2021-05-03 NOTE — Telephone Encounter (Signed)
C/o elevated B/P . Reports just leaving hospital due to sick and vomiting and required IV fluids. Reports her B/P has been elevated and wants to know which B/P medications she is supposed to take. Patient reports she stopped taking B/P meds because she did not feel she needed that many to manage B/P . Denies dizziness, headaches, blurred vision, weakness on either side, no chest pain . B/P on call was 151/70  HR 93 at 5:09 pm. Patient reports she stopped taking amlodipine , coreg and losartan approx. 1 month ago. Reviewed with patient in depth medications on med list and how PCP prescribed for her to take. Patient verbalized understanding but NT requesting more review of administration times patient should take her medications. Appt scheduled for 05/13/21.. care advise given. Patient verbalized understanding of care advise and to call back or go to [facility] or ED if symptoms worsen. Please advise.

## 2021-05-03 NOTE — Telephone Encounter (Unsigned)
Pt called to see what Dr. Army Melia would advise her to take to bring her BP down /last reading it was 215 / pt is in the hospital now in Triage and has been there since 6am this morning and still hasnt been seen/ she stated that the medication  she has been taking is not helping and she also asked for an RX for the nausea she has been experiencing as well/ please advise asap   Pt doesn't want to go home and have nothing to take for her BP issue

## 2021-05-03 NOTE — Telephone Encounter (Signed)
Reason for Disposition  [2] Systolic BP  >= 446 OR Diastolic >= 80 AND [9] not taking BP medications  Answer Assessment - Initial Assessment Questions 1. BLOOD PRESSURE: "What is the blood pressure?" "Did you take at least two measurements 5 minutes apart?"     5:09pm B/P 151/70 P 93  2. ONSET: "When did you take your blood pressure?"     Now  3. HOW: "How did you obtain the blood pressure?" (e.g., visiting nurse, automatic home BP monitor)     Automatic B/P monitor  4. HISTORY: "Do you have a history of high blood pressure?"     Yes 5. MEDICATIONS: "Are you taking any medications for blood pressure?" "Have you missed any doses recently?"     Stopped taking medications x 1 month ago  6. OTHER SYMPTOMS: "Do you have any symptoms?" (e.g., headache, chest pain, blurred vision, difficulty breathing, weakness)     Denies  7. PREGNANCY: "Is there any chance you are pregnant?" "When was your last menstrual period?"     na  Protocols used: Blood Pressure - High-A-AH

## 2021-05-06 DIAGNOSIS — D3500 Benign neoplasm of unspecified adrenal gland: Secondary | ICD-10-CM | POA: Diagnosis not present

## 2021-05-06 DIAGNOSIS — Z6828 Body mass index (BMI) 28.0-28.9, adult: Secondary | ICD-10-CM | POA: Diagnosis not present

## 2021-05-06 DIAGNOSIS — E139 Other specified diabetes mellitus without complications: Secondary | ICD-10-CM | POA: Diagnosis not present

## 2021-05-06 NOTE — Telephone Encounter (Signed)
Noted  KP 

## 2021-05-07 ENCOUNTER — Encounter (INDEPENDENT_AMBULATORY_CARE_PROVIDER_SITE_OTHER): Payer: Self-pay

## 2021-05-07 ENCOUNTER — Other Ambulatory Visit: Payer: Self-pay | Admitting: Internal Medicine

## 2021-05-07 DIAGNOSIS — I10 Essential (primary) hypertension: Secondary | ICD-10-CM | POA: Diagnosis not present

## 2021-05-07 DIAGNOSIS — N185 Chronic kidney disease, stage 5: Secondary | ICD-10-CM | POA: Diagnosis not present

## 2021-05-07 DIAGNOSIS — Z1389 Encounter for screening for other disorder: Secondary | ICD-10-CM | POA: Diagnosis not present

## 2021-05-07 DIAGNOSIS — E1022 Type 1 diabetes mellitus with diabetic chronic kidney disease: Secondary | ICD-10-CM | POA: Diagnosis not present

## 2021-05-07 DIAGNOSIS — N2581 Secondary hyperparathyroidism of renal origin: Secondary | ICD-10-CM | POA: Diagnosis not present

## 2021-05-07 DIAGNOSIS — R5383 Other fatigue: Secondary | ICD-10-CM | POA: Diagnosis not present

## 2021-05-07 DIAGNOSIS — R531 Weakness: Secondary | ICD-10-CM | POA: Diagnosis not present

## 2021-05-08 DIAGNOSIS — B0051 Herpesviral iridocyclitis: Secondary | ICD-10-CM | POA: Diagnosis not present

## 2021-05-08 DIAGNOSIS — H4052X4 Glaucoma secondary to other eye disorders, left eye, indeterminate stage: Secondary | ICD-10-CM | POA: Diagnosis not present

## 2021-05-08 DIAGNOSIS — Z961 Presence of intraocular lens: Secondary | ICD-10-CM | POA: Diagnosis not present

## 2021-05-08 DIAGNOSIS — E113593 Type 2 diabetes mellitus with proliferative diabetic retinopathy without macular edema, bilateral: Secondary | ICD-10-CM | POA: Diagnosis not present

## 2021-05-08 DIAGNOSIS — H16102 Unspecified superficial keratitis, left eye: Secondary | ICD-10-CM | POA: Diagnosis not present

## 2021-05-13 ENCOUNTER — Ambulatory Visit: Payer: BC Managed Care – PPO | Admitting: Internal Medicine

## 2021-05-13 ENCOUNTER — Other Ambulatory Visit (INDEPENDENT_AMBULATORY_CARE_PROVIDER_SITE_OTHER): Payer: Self-pay | Admitting: Nurse Practitioner

## 2021-05-13 ENCOUNTER — Other Ambulatory Visit: Payer: Self-pay

## 2021-05-13 ENCOUNTER — Encounter
Admission: RE | Disposition: A | Discharge status: Home / Self Care | Payer: Self-pay | Source: Home / Self Care | Attending: Vascular Surgery

## 2021-05-13 ENCOUNTER — Encounter: Payer: Self-pay | Admitting: Vascular Surgery

## 2021-05-13 ENCOUNTER — Ambulatory Visit
Admission: RE | Admit: 2021-05-13 | Discharge: 2021-05-13 | Disposition: A | Discharge status: Home / Self Care | Payer: BC Managed Care – PPO | Attending: Vascular Surgery | Admitting: Vascular Surgery

## 2021-05-13 DIAGNOSIS — Z880 Allergy status to penicillin: Secondary | ICD-10-CM | POA: Insufficient documentation

## 2021-05-13 DIAGNOSIS — Z888 Allergy status to other drugs, medicaments and biological substances status: Secondary | ICD-10-CM | POA: Insufficient documentation

## 2021-05-13 DIAGNOSIS — E1122 Type 2 diabetes mellitus with diabetic chronic kidney disease: Secondary | ICD-10-CM | POA: Diagnosis not present

## 2021-05-13 DIAGNOSIS — Z833 Family history of diabetes mellitus: Secondary | ICD-10-CM | POA: Diagnosis not present

## 2021-05-13 DIAGNOSIS — N186 End stage renal disease: Secondary | ICD-10-CM

## 2021-05-13 HISTORY — PX: DIALYSIS/PERMA CATHETER INSERTION: CATH118288

## 2021-05-13 HISTORY — DX: Chronic kidney disease, unspecified: N18.9

## 2021-05-13 SURGERY — DIALYSIS/PERMA CATHETER INSERTION
Anesthesia: Moderate Sedation

## 2021-05-13 MED ORDER — FENTANYL CITRATE (PF) 100 MCG/2ML IJ SOLN
INTRAMUSCULAR | Status: DC | PRN
  Administered 2021-05-13: 50 ug via INTRAVENOUS

## 2021-05-13 MED ORDER — CLINDAMYCIN PHOSPHATE 300 MG/50ML IV SOLN
INTRAVENOUS | Status: AC
  Administered 2021-05-13: 300 mg via INTRAVENOUS
  Filled 2021-05-13: qty 50

## 2021-05-13 MED ORDER — CHLORHEXIDINE GLUCONATE CLOTH 2 % EX PADS
6.0000 | MEDICATED_PAD | Freq: Every day | CUTANEOUS | Status: DC
  Administered 2021-05-13: 6 via TOPICAL

## 2021-05-13 MED ORDER — MIDAZOLAM HCL 2 MG/2ML IJ SOLN
INTRAMUSCULAR | Status: AC
  Filled 2021-05-13: qty 2

## 2021-05-13 MED ORDER — HYDROMORPHONE HCL 1 MG/ML IJ SOLN: 1.0000 mg | Freq: Once | INTRAMUSCULAR | Status: DC | PRN

## 2021-05-13 MED ORDER — METOCLOPRAMIDE HCL 5 MG/ML IJ SOLN
INTRAMUSCULAR | Status: AC
  Administered 2021-05-13: 10 mg via INTRAVENOUS
  Filled 2021-05-13: qty 2

## 2021-05-13 MED ORDER — ONDANSETRON HCL 4 MG/2ML IJ SOLN: 4.0000 mg | Freq: Four times a day (QID) | INTRAMUSCULAR | Status: DC | PRN

## 2021-05-13 MED ORDER — SODIUM CHLORIDE 0.9 % IV SOLN: INTRAVENOUS | Status: DC

## 2021-05-13 MED ORDER — FENTANYL CITRATE PF 50 MCG/ML IJ SOSY
PREFILLED_SYRINGE | INTRAMUSCULAR | Status: AC
  Filled 2021-05-13: qty 1

## 2021-05-13 MED ORDER — METHYLPREDNISOLONE SODIUM SUCC 125 MG IJ SOLR: 125.0000 mg | Freq: Once | INTRAMUSCULAR | Status: DC | PRN

## 2021-05-13 MED ORDER — DIPHENHYDRAMINE HCL 50 MG/ML IJ SOLN: 50.0000 mg | Freq: Once | INTRAMUSCULAR | Status: DC | PRN

## 2021-05-13 MED ORDER — METOCLOPRAMIDE HCL 5 MG/ML IJ SOLN: 10.0000 mg | Freq: Once | INTRAMUSCULAR | Status: AC

## 2021-05-13 MED ORDER — CLINDAMYCIN PHOSPHATE 300 MG/50ML IV SOLN: 300.0000 mg | Freq: Once | INTRAVENOUS | Status: AC

## 2021-05-13 MED ORDER — MIDAZOLAM HCL 2 MG/2ML IJ SOLN
INTRAMUSCULAR | Status: DC | PRN
  Administered 2021-05-13: 2 mg via INTRAVENOUS

## 2021-05-13 MED ORDER — MIDAZOLAM HCL 2 MG/ML PO SYRP: 8.0000 mg | ORAL_SOLUTION | Freq: Once | ORAL | Status: DC | PRN

## 2021-05-13 MED ORDER — FAMOTIDINE 20 MG PO TABS: 40.0000 mg | ORAL_TABLET | Freq: Once | ORAL | Status: DC | PRN

## 2021-05-13 SURGICAL SUPPLY — 9 items
ADH SKN CLS APL DERMABOND .7 (GAUZE/BANDAGES/DRESSINGS) ×1
BIOPATCH RED 1 DISK 7.0 (GAUZE/BANDAGES/DRESSINGS) ×2 IMPLANT
CATH CANNON HEMO 15FR 19 (HEMODIALYSIS SUPPLIES) ×2 IMPLANT
COVER PROBE U/S 5X48 (MISCELLANEOUS) ×2 IMPLANT
DERMABOND ADVANCED (GAUZE/BANDAGES/DRESSINGS) ×1
DERMABOND ADVANCED .7 DNX12 (GAUZE/BANDAGES/DRESSINGS) ×1 IMPLANT
PACK ANGIOGRAPHY (CUSTOM PROCEDURE TRAY) ×2 IMPLANT
SUT MNCRL AB 4-0 PS2 18 (SUTURE) ×2 IMPLANT
SUT PROLENE 0 CT 1 30 (SUTURE) ×2 IMPLANT

## 2021-05-13 NOTE — H&P (Signed)
[facility] Admission History & Physical  MRN : [id number]  Kelli [name] is a 60 y.o. ([birth year]) female who presents with chief complaint of No chief complaint on file. Marland Kitchen  History of Present Illness: Patient was sent over by nephrology and the dialysis center for the placement of a PermCath to initiate hemodialysis.  No current hemodialysis access.  No fevers or chills.  No other complaints today.  Current Facility-Administered Medications  Medication Dose Route Frequency Provider Last Rate Last Admin   clindamycin (CLEOCIN) 300 MG/50ML IVPB            0.9 %  sodium chloride infusion   Intravenous Continuous Kris Hartmann, NP       Chlorhexidine Gluconate Cloth 2 % PADS 6 each  6 each Topical Q0600 Kris Hartmann, NP       clindamycin (CLEOCIN) IVPB 300 mg  300 mg Intravenous Once Kris Hartmann, NP       diphenhydrAMINE (BENADRYL) injection 50 mg  50 mg Intravenous Once PRN Kris Hartmann, NP       famotidine (PEPCID) tablet 40 mg  40 mg Oral Once PRN Kris Hartmann, NP       HYDROmorphone (DILAUDID) injection 1 mg  1 mg Intravenous Once PRN Kris Hartmann, NP       methylPREDNISolone sodium succinate (SOLU-MEDROL) 125 mg/2 mL injection 125 mg  125 mg Intravenous Once PRN Kris Hartmann, NP       midazolam (VERSED) 2 MG/ML syrup 8 mg  8 mg Oral Once PRN Kris Hartmann, NP       ondansetron [facility]) injection 4 mg  4 mg Intravenous Q6H PRN Kris Hartmann, NP        Past Medical History:  Diagnosis Date   Diabetes mellitus without complication ([place])    Migraine    Staph infection     Past Surgical History:  Procedure Laterality Date   ABDOMINAL HYSTERECTOMY     COLONOSCOPY W/ POLYPECTOMY     ESOPHAGOGASTRODUODENOSCOPY  03/02/2017   Normal   HEMORRHOID SURGERY       Social History   Tobacco Use   Smoking status: Former   Smokeless tobacco: Never  Vaping Use   Vaping Use: Never used  Substance Use Topics   Alcohol use: No   Drug  use: No     Family History  Problem Relation Age of Onset   Heart failure Mother    Diabetes Mother    Breast cancer Neg Hx     Allergies  Allergen Reactions   Statins Nausea And Vomiting   Gabapentin Swelling   Hydrochlorothiazide Nausea And Vomiting   Lisinopril Other (See Comments)    fatigue   Lyrica [Pregabalin] Swelling   Metformin And Related Nausea And Vomiting   Penicillins Nausea And Vomiting     REVIEW OF SYSTEMS (Negative unless checked)  Constitutional: [] Weight loss  [] Fever  [] Chills Cardiac: [] Chest pain   [] Chest pressure   [] Palpitations   [] Shortness of breath when laying flat   [] Shortness of breath at rest   [] Shortness of breath with exertion. Vascular:  [] Pain in legs with walking   [] Pain in legs at rest   [] Pain in legs when laying flat   [] Claudication   [] Pain in feet when walking  [] Pain in feet at rest  [] Pain in feet when laying flat   [] History of DVT   [] Phlebitis   [] Swelling in legs   [] Varicose  veins   [] Non-healing ulcers Pulmonary:   [] Uses home oxygen   [] Productive cough   [] Hemoptysis   [] Wheeze  [] COPD   [] Asthma Neurologic:  [] Dizziness  [] Blackouts   [] Seizures   [] History of stroke   [] History of TIA  [] Aphasia   [] Temporary blindness   [] Dysphagia   [] Weakness or numbness in arms   [] Weakness or numbness in legs Musculoskeletal:  [] Arthritis   [] Joint swelling   [] Joint pain   [] Low back pain Hematologic:  [] Easy bruising  [] Easy bleeding   [] Hypercoagulable state   [x] Anemic  [] Hepatitis Gastrointestinal:  [] Blood in stool   [] Vomiting blood  [] Gastroesophageal reflux/heartburn   [] Difficulty swallowing. Genitourinary:  [x] Chronic kidney disease   [] Difficult urination  [] Frequent urination  [] Burning with urination   [] Blood in urine Skin:  [] Rashes   [] Ulcers   [] Wounds Psychological:  [] History of anxiety   []  History of major depression.  Physical Examination  There were no vitals filed for this visit. There is no height or  weight on file to calculate BMI. Gen: WD/WN, NAD Head: [place]/AT, No temporalis wasting.  Ear/Nose/Throat: Hearing grossly intact, nares w/o erythema or drainage, oropharynx w/o Erythema/Exudate,  Eyes: Conjunctiva clear, sclera non-icteric Neck: Trachea midline.  No JVD.  Pulmonary:  Good air movement, respirations not labored, no use of accessory muscles.  Cardiac: RRR, normal S1, S2. Vascular:  Vessel Right Left  Radial Palpable Palpable               Musculoskeletal: M/S 5/5 throughout.  Extremities without ischemic changes.  No deformity or atrophy.  Neurologic: Sensation grossly intact in extremities.  Symmetrical.  Speech is fluent. Motor exam as listed above. Psychiatric: Judgment intact, Mood & affect appropriate for pt's clinical situation. Dermatologic: No rashes or ulcers noted.  No cellulitis or open wounds.      CBC Lab Results  Component Value Date   WBC 7.0 09/02/2018   HGB 11.7 (L) 09/02/2018   HCT 36.3 09/02/2018   MCV 87.1 09/02/2018   PLT 347 09/02/2018    BMET    Component Value Date/Time   NA 140 04/10/2021 1613   K 3.8 04/10/2021 1613   CL 103 04/10/2021 1613   CO2 24 04/10/2021 1613   GLUCOSE 168 (H) 04/10/2021 1613   GLUCOSE 326 (H) 01/25/2021 0943   BUN 40 (H) 04/10/2021 1613   CREATININE 4.13 (H) 04/10/2021 1613   CALCIUM 8.3 (L) 04/10/2021 1613   GFRNONAA 15 (L) 01/25/2021 0943   GFRAA 38 (L) 07/18/2019 1427   CrCl cannot be calculated (Patient's most recent lab result is older than the maximum 21 days allowed.).  COAG No results found for: INR, PROTIME  Radiology No results found.   Assessment/Plan 1.  End-stage renal disease.  PermCath placement today.  Risks and benefits discussed. 2.  Diabetes.  Likely an underlying cause of renal failure and blood glucose control important in reducing the progression of atherosclerotic disease. Also, involved in wound healing. On appropriate medications.    Leotis Pain, MD  05/13/2021 11:59  AM

## 2021-05-13 NOTE — Op Note (Signed)
OPERATIVE NOTE    PRE-OPERATIVE DIAGNOSIS: 1. ESRD   POST-OPERATIVE DIAGNOSIS: same as above  PROCEDURE: Ultrasound guidance for vascular access to the right internal jugular vein Fluoroscopic guidance for placement of catheter Placement of a 19 cm tip to cuff tunneled hemodialysis catheter via the right internal jugular vein  SURGEON: Leotis Pain, MD  ANESTHESIA:  Local with Moderate conscious sedation for approximately 17 minutes using 2 mg of Versed and 50 mcg of Fentanyl  ESTIMATED BLOOD LOSS: 3 cc  FLUORO TIME: less than one minute  CONTRAST: none  FINDING(S): 1.  Patent right internal jugular vein  SPECIMEN(S):  None  INDICATIONS:   Kelli [name] is a 60 y.o.female who presents with renal failure.  The patient needs long term dialysis access for their ESRD, and a Permcath is necessary.  Risks and benefits are discussed and informed consent is obtained.    DESCRIPTION: After obtaining full informed written consent, the patient was brought back to the vascular suited. The patient's right neck and chest were sterilely prepped and draped in a sterile surgical field was created. Moderate conscious sedation was administered during a face to face encounter with the patient throughout the procedure with my supervision of the RN administering medicines and monitoring the patient's vital signs, pulse oximetry, telemetry and mental status throughout from the start of the procedure until the patient was taken to the recovery room.  The right internal jugular vein was visualized with ultrasound and found to be patent. It was then accessed under direct ultrasound guidance and a permanent image was recorded. A wire was placed. After skin nick and dilatation, the peel-away sheath was placed over the wire. I then turned my attention to an area under the clavicle. Approximately 1-2 fingerbreadths below the clavicle a small counterincision was created and tunneled from the subclavicular incision  to the access site. Using fluoroscopic guidance, a 19 centimeter tip to cuff tunneled hemodialysis catheter was selected, and tunneled from the subclavicular incision to the access site. It was then placed through the peel-away sheath and the peel-away sheath was removed. Using fluoroscopic guidance the catheter tips were parked in the right atrium. The appropriate distal connectors were placed. It withdrew blood well and flushed easily with heparinized saline and a concentrated heparin solution was then placed. It was secured to the chest wall with 2 Prolene sutures. The access incision was closed single 4-0 Monocryl. A 4-0 Monocryl pursestring suture was placed around the exit site. Sterile dressings were placed. The patient tolerated the procedure well and was taken to the recovery room in stable condition.  COMPLICATIONS: None  CONDITION: Stable  Leotis Pain, MD 05/13/2021 1:06 PM   This note was created with Dragon Medical transcription system. Any errors in dictation are purely unintentional.

## 2021-05-22 DIAGNOSIS — I10 Essential (primary) hypertension: Secondary | ICD-10-CM | POA: Diagnosis not present

## 2021-05-22 DIAGNOSIS — E1140 Type 2 diabetes mellitus with diabetic neuropathy, unspecified: Secondary | ICD-10-CM | POA: Diagnosis not present

## 2021-05-22 DIAGNOSIS — R1310 Dysphagia, unspecified: Secondary | ICD-10-CM | POA: Diagnosis not present

## 2021-05-22 DIAGNOSIS — E7800 Pure hypercholesterolemia, unspecified: Secondary | ICD-10-CM | POA: Diagnosis not present

## 2021-05-22 DIAGNOSIS — K219 Gastro-esophageal reflux disease without esophagitis: Secondary | ICD-10-CM | POA: Diagnosis not present

## 2021-05-22 DIAGNOSIS — R1110 Vomiting, unspecified: Secondary | ICD-10-CM | POA: Diagnosis not present

## 2021-05-22 DIAGNOSIS — Z880 Allergy status to penicillin: Secondary | ICD-10-CM | POA: Diagnosis not present

## 2021-05-22 DIAGNOSIS — K2100 Gastro-esophageal reflux disease with esophagitis, without bleeding: Secondary | ICD-10-CM | POA: Diagnosis not present

## 2021-05-26 ENCOUNTER — Emergency Department: Payer: BC Managed Care – PPO

## 2021-05-26 ENCOUNTER — Inpatient Hospital Stay
Admission: EM | Admit: 2021-05-26 | Discharge: 2021-05-31 | DRG: 981 | Disposition: A | Discharge status: Home / Self Care | Payer: BC Managed Care – PPO | Attending: Internal Medicine | Admitting: Internal Medicine

## 2021-05-26 ENCOUNTER — Other Ambulatory Visit: Payer: Self-pay

## 2021-05-26 DIAGNOSIS — F331 Major depressive disorder, recurrent, moderate: Secondary | ICD-10-CM | POA: Diagnosis present

## 2021-05-26 DIAGNOSIS — R1110 Vomiting, unspecified: Secondary | ICD-10-CM | POA: Diagnosis not present

## 2021-05-26 DIAGNOSIS — Z794 Long term (current) use of insulin: Secondary | ICD-10-CM | POA: Diagnosis not present

## 2021-05-26 DIAGNOSIS — H409 Unspecified glaucoma: Secondary | ICD-10-CM | POA: Diagnosis present

## 2021-05-26 DIAGNOSIS — D179 Benign lipomatous neoplasm, unspecified: Secondary | ICD-10-CM | POA: Diagnosis not present

## 2021-05-26 DIAGNOSIS — K5900 Constipation, unspecified: Secondary | ICD-10-CM | POA: Diagnosis not present

## 2021-05-26 DIAGNOSIS — N179 Acute kidney failure, unspecified: Secondary | ICD-10-CM | POA: Diagnosis present

## 2021-05-26 DIAGNOSIS — D1779 Benign lipomatous neoplasm of other sites: Secondary | ICD-10-CM | POA: Diagnosis not present

## 2021-05-26 DIAGNOSIS — R778 Other specified abnormalities of plasma proteins: Secondary | ICD-10-CM | POA: Diagnosis present

## 2021-05-26 DIAGNOSIS — E860 Dehydration: Secondary | ICD-10-CM | POA: Diagnosis not present

## 2021-05-26 DIAGNOSIS — D631 Anemia in chronic kidney disease: Secondary | ICD-10-CM | POA: Diagnosis not present

## 2021-05-26 DIAGNOSIS — N2581 Secondary hyperparathyroidism of renal origin: Secondary | ICD-10-CM | POA: Diagnosis present

## 2021-05-26 DIAGNOSIS — E1022 Type 1 diabetes mellitus with diabetic chronic kidney disease: Secondary | ICD-10-CM | POA: Diagnosis not present

## 2021-05-26 DIAGNOSIS — K2100 Gastro-esophageal reflux disease with esophagitis, without bleeding: Secondary | ICD-10-CM | POA: Diagnosis present

## 2021-05-26 DIAGNOSIS — Z90710 Acquired absence of both cervix and uterus: Secondary | ICD-10-CM

## 2021-05-26 DIAGNOSIS — E1143 Type 2 diabetes mellitus with diabetic autonomic (poly)neuropathy: Secondary | ICD-10-CM | POA: Diagnosis not present

## 2021-05-26 DIAGNOSIS — E1043 Type 1 diabetes mellitus with diabetic autonomic (poly)neuropathy: Principal | ICD-10-CM | POA: Diagnosis present

## 2021-05-26 DIAGNOSIS — I120 Hypertensive chronic kidney disease with stage 5 chronic kidney disease or end stage renal disease: Secondary | ICD-10-CM | POA: Diagnosis not present

## 2021-05-26 DIAGNOSIS — N185 Chronic kidney disease, stage 5: Secondary | ICD-10-CM | POA: Diagnosis not present

## 2021-05-26 DIAGNOSIS — R1013 Epigastric pain: Secondary | ICD-10-CM | POA: Diagnosis not present

## 2021-05-26 DIAGNOSIS — N186 End stage renal disease: Secondary | ICD-10-CM | POA: Diagnosis not present

## 2021-05-26 DIAGNOSIS — R079 Chest pain, unspecified: Secondary | ICD-10-CM | POA: Diagnosis not present

## 2021-05-26 DIAGNOSIS — Z8249 Family history of ischemic heart disease and other diseases of the circulatory system: Secondary | ICD-10-CM

## 2021-05-26 DIAGNOSIS — E1142 Type 2 diabetes mellitus with diabetic polyneuropathy: Secondary | ICD-10-CM | POA: Diagnosis present

## 2021-05-26 DIAGNOSIS — K30 Functional dyspepsia: Secondary | ICD-10-CM | POA: Diagnosis not present

## 2021-05-26 DIAGNOSIS — Z992 Dependence on renal dialysis: Secondary | ICD-10-CM | POA: Diagnosis not present

## 2021-05-26 DIAGNOSIS — E1122 Type 2 diabetes mellitus with diabetic chronic kidney disease: Secondary | ICD-10-CM | POA: Diagnosis not present

## 2021-05-26 DIAGNOSIS — Z888 Allergy status to other drugs, medicaments and biological substances status: Secondary | ICD-10-CM

## 2021-05-26 DIAGNOSIS — E876 Hypokalemia: Secondary | ICD-10-CM | POA: Diagnosis present

## 2021-05-26 DIAGNOSIS — R0789 Other chest pain: Secondary | ICD-10-CM | POA: Diagnosis not present

## 2021-05-26 DIAGNOSIS — Z20822 Contact with and (suspected) exposure to covid-19: Secondary | ICD-10-CM | POA: Diagnosis present

## 2021-05-26 DIAGNOSIS — I10 Essential (primary) hypertension: Secondary | ICD-10-CM | POA: Diagnosis not present

## 2021-05-26 DIAGNOSIS — A5216 Charcot's arthropathy (tabetic): Secondary | ICD-10-CM | POA: Diagnosis not present

## 2021-05-26 DIAGNOSIS — Z833 Family history of diabetes mellitus: Secondary | ICD-10-CM

## 2021-05-26 DIAGNOSIS — R1111 Vomiting without nausea: Secondary | ICD-10-CM | POA: Diagnosis not present

## 2021-05-26 DIAGNOSIS — R112 Nausea with vomiting, unspecified: Secondary | ICD-10-CM | POA: Diagnosis not present

## 2021-05-26 DIAGNOSIS — K3184 Gastroparesis: Secondary | ICD-10-CM | POA: Diagnosis present

## 2021-05-26 DIAGNOSIS — Z87891 Personal history of nicotine dependence: Secondary | ICD-10-CM

## 2021-05-26 DIAGNOSIS — E1165 Type 2 diabetes mellitus with hyperglycemia: Secondary | ICD-10-CM | POA: Diagnosis not present

## 2021-05-26 DIAGNOSIS — Z880 Allergy status to penicillin: Secondary | ICD-10-CM

## 2021-05-26 DIAGNOSIS — E785 Hyperlipidemia, unspecified: Secondary | ICD-10-CM | POA: Diagnosis present

## 2021-05-26 DIAGNOSIS — E1140 Type 2 diabetes mellitus with diabetic neuropathy, unspecified: Secondary | ICD-10-CM | POA: Diagnosis not present

## 2021-05-26 DIAGNOSIS — R0602 Shortness of breath: Secondary | ICD-10-CM | POA: Diagnosis not present

## 2021-05-26 DIAGNOSIS — E1065 Type 1 diabetes mellitus with hyperglycemia: Secondary | ICD-10-CM | POA: Diagnosis present

## 2021-05-26 DIAGNOSIS — K449 Diaphragmatic hernia without obstruction or gangrene: Secondary | ICD-10-CM | POA: Diagnosis not present

## 2021-05-26 DIAGNOSIS — R739 Hyperglycemia, unspecified: Secondary | ICD-10-CM | POA: Diagnosis not present

## 2021-05-26 DIAGNOSIS — R109 Unspecified abdominal pain: Secondary | ICD-10-CM | POA: Diagnosis not present

## 2021-05-26 DIAGNOSIS — E1136 Type 2 diabetes mellitus with diabetic cataract: Secondary | ICD-10-CM | POA: Diagnosis not present

## 2021-05-26 DIAGNOSIS — E1042 Type 1 diabetes mellitus with diabetic polyneuropathy: Secondary | ICD-10-CM | POA: Diagnosis not present

## 2021-05-26 DIAGNOSIS — Z79899 Other long term (current) drug therapy: Secondary | ICD-10-CM

## 2021-05-26 LAB — RESP PANEL BY RT-PCR (FLU A&B, COVID) ARPGX2
Influenza A by PCR: NEGATIVE
Influenza B by PCR: NEGATIVE
SARS Coronavirus 2 by RT PCR: NEGATIVE

## 2021-05-26 LAB — URINALYSIS, COMPLETE (UACMP) WITH MICROSCOPIC
Bacteria, UA: NONE SEEN
Bilirubin Urine: NEGATIVE
Glucose, UA: >=500 mg/dL — AB
Hgb urine dipstick: NEGATIVE
Ketones, ur: 5 mg/dL — AB
Leukocytes,Ua: NEGATIVE
Nitrite: NEGATIVE
Protein, ur: >=300 mg/dL — AB
Specific Gravity, Urine: 1.017 (ref 1.005–1.030)
pH: 6 (ref 5.0–8.0)

## 2021-05-26 LAB — CBC
HCT: 28.4 % — ABNORMAL LOW (ref 36.0–46.0)
Hemoglobin: 9.8 g/dL — ABNORMAL LOW (ref 12.0–15.0)
MCH: 29.7 pg (ref 26.0–34.0)
MCHC: 34.5 g/dL (ref 30.0–36.0)
MCV: 86.1 fL (ref 80.0–100.0)
Platelets: 304 10*3/uL (ref 150–400)
RBC: 3.3 MIL/uL — ABNORMAL LOW (ref 3.87–5.11)
RDW: 12.9 % (ref 11.5–15.5)
WBC: 9.8 10*3/uL (ref 4.0–10.5)
nRBC: 0 % (ref 0.0–0.2)

## 2021-05-26 LAB — HEPATIC FUNCTION PANEL
ALT: 11 U/L (ref 0–44)
AST: 19 U/L (ref 15–41)
Albumin: 3.4 g/dL — ABNORMAL LOW (ref 3.5–5.0)
Alkaline Phosphatase: 55 U/L (ref 38–126)
Bilirubin, Direct: <0.1 mg/dL (ref 0.0–0.2)
Total Bilirubin: 0.9 mg/dL (ref 0.3–1.2)
Total Protein: 6 g/dL — ABNORMAL LOW (ref 6.5–8.1)

## 2021-05-26 LAB — BASIC METABOLIC PANEL
Anion gap: 14 (ref 5–15)
BUN: 36 mg/dL — ABNORMAL HIGH (ref 8–23)
CO2: 29 mmol/L (ref 22–32)
Calcium: 9.2 mg/dL (ref 8.9–10.3)
Chloride: 96 mmol/L — ABNORMAL LOW (ref 98–111)
Creatinine, Ser: 4.93 mg/dL — ABNORMAL HIGH (ref 0.44–1.00)
GFR, Estimated: 9 mL/min — ABNORMAL LOW (ref >60)
Glucose, Bld: 260 mg/dL — ABNORMAL HIGH (ref 70–99)
Potassium: 3.6 mmol/L (ref 3.5–5.1)
Sodium: 139 mmol/L (ref 135–145)

## 2021-05-26 LAB — CBG MONITORING, ED
Glucose-Capillary: 191 mg/dL — ABNORMAL HIGH (ref 70–99)
Glucose-Capillary: 211 mg/dL — ABNORMAL HIGH (ref 70–99)

## 2021-05-26 LAB — TROPONIN I (HIGH SENSITIVITY)
Troponin I (High Sensitivity): 76 ng/L — ABNORMAL HIGH (ref <18)
Troponin I (High Sensitivity): 89 ng/L — ABNORMAL HIGH (ref <18)

## 2021-05-26 LAB — LIPASE, BLOOD: Lipase: 22 U/L (ref 11–51)

## 2021-05-26 LAB — TSH: TSH: 0.619 u[IU]/mL (ref 0.350–4.500)

## 2021-05-26 MED ORDER — ENOXAPARIN SODIUM 40 MG/0.4ML IJ SOSY: 40.0000 mg | PREFILLED_SYRINGE | INTRAMUSCULAR | Status: DC

## 2021-05-26 MED ORDER — METOCLOPRAMIDE HCL 5 MG/ML IJ SOLN
10.0000 mg | Freq: Once | INTRAMUSCULAR | Status: AC
  Administered 2021-05-26: 10 mg via INTRAVENOUS
  Filled 2021-05-26: qty 2

## 2021-05-26 MED ORDER — SODIUM CHLORIDE 0.9 % IV SOLN
6.2500 mg | Freq: Four times a day (QID) | INTRAVENOUS | Status: AC
  Administered 2021-05-26 – 2021-05-28 (×5): 6.25 mg via INTRAVENOUS
  Filled 2021-05-26 (×8): qty 0.25

## 2021-05-26 MED ORDER — PANTOPRAZOLE SODIUM 40 MG IV SOLR
40.0000 mg | Freq: Two times a day (BID) | INTRAVENOUS | Status: DC
  Administered 2021-05-26 – 2021-05-31 (×10): 40 mg via INTRAVENOUS
  Filled 2021-05-26 (×11): qty 40

## 2021-05-26 MED ORDER — INSULIN GLARGINE-YFGN 100 UNIT/ML ~~LOC~~ SOLN
16.0000 [IU] | Freq: Every day | SUBCUTANEOUS | Status: DC
  Filled 2021-05-26 (×2): qty 0.16

## 2021-05-26 MED ORDER — ONDANSETRON 4 MG PO TBDP
4.0000 mg | ORAL_TABLET | ORAL | Status: DC | PRN
  Filled 2021-05-26: qty 1

## 2021-05-26 MED ORDER — HEPARIN SODIUM (PORCINE) 5000 UNIT/ML IJ SOLN
5000.0000 [IU] | Freq: Three times a day (TID) | INTRAMUSCULAR | Status: DC
  Administered 2021-05-26 – 2021-05-31 (×11): 5000 [IU] via SUBCUTANEOUS
  Filled 2021-05-26 (×12): qty 1

## 2021-05-26 MED ORDER — TRAZODONE HCL 50 MG PO TABS: 25.0000 mg | ORAL_TABLET | Freq: Every evening | ORAL | Status: DC | PRN

## 2021-05-26 MED ORDER — METOCLOPRAMIDE HCL 5 MG/ML IJ SOLN
10.0000 mg | Freq: Four times a day (QID) | INTRAMUSCULAR | Status: DC
  Administered 2021-05-26 – 2021-05-27 (×2): 10 mg via INTRAVENOUS
  Filled 2021-05-26 (×2): qty 2

## 2021-05-26 MED ORDER — CARVEDILOL 12.5 MG PO TABS
12.5000 mg | ORAL_TABLET | Freq: Two times a day (BID) | ORAL | Status: DC
  Administered 2021-05-27 – 2021-05-31 (×8): 12.5 mg via ORAL
  Filled 2021-05-26 (×4): qty 1
  Filled 2021-05-26: qty 2
  Filled 2021-05-26 (×4): qty 1

## 2021-05-26 MED ORDER — LABETALOL HCL 5 MG/ML IV SOLN
10.0000 mg | Freq: Once | INTRAVENOUS | Status: AC
  Administered 2021-05-26: 10 mg via INTRAVENOUS
  Filled 2021-05-26: qty 4

## 2021-05-26 MED ORDER — ACETAMINOPHEN 325 MG PO TABS
650.0000 mg | ORAL_TABLET | Freq: Four times a day (QID) | ORAL | Status: DC | PRN
  Administered 2021-05-27 – 2021-05-31 (×3): 650 mg via ORAL
  Filled 2021-05-26 (×4): qty 2

## 2021-05-26 MED ORDER — LABETALOL HCL 5 MG/ML IV SOLN
10.0000 mg | INTRAVENOUS | Status: DC | PRN
  Administered 2021-05-26 – 2021-05-30 (×5): 10 mg via INTRAVENOUS
  Filled 2021-05-26 (×3): qty 4

## 2021-05-26 MED ORDER — SENNA 8.6 MG PO TABS: 1.0000 | ORAL_TABLET | Freq: Every day | ORAL | Status: DC

## 2021-05-26 MED ORDER — INSULIN ASPART 100 UNIT/ML IJ SOLN
0.0000 [IU] | INTRAMUSCULAR | Status: DC
  Administered 2021-05-26: 3 [IU] via SUBCUTANEOUS
  Administered 2021-05-26: 8 [IU] via SUBCUTANEOUS
  Administered 2021-05-27: 3 [IU] via SUBCUTANEOUS
  Administered 2021-05-27: 5 [IU] via SUBCUTANEOUS
  Filled 2021-05-26 (×3): qty 1

## 2021-05-26 MED ORDER — METOCLOPRAMIDE HCL 10 MG PO TABS
10.0000 mg | ORAL_TABLET | Freq: Three times a day (TID) | ORAL | Status: DC | PRN
  Filled 2021-05-26: qty 1

## 2021-05-26 MED ORDER — LACTATED RINGERS IV BOLUS
1000.0000 mL | Freq: Once | INTRAVENOUS | Status: AC
  Administered 2021-05-26: 1000 mL via INTRAVENOUS

## 2021-05-26 MED ORDER — ASPIRIN 81 MG PO CHEW
324.0000 mg | CHEWABLE_TABLET | Freq: Once | ORAL | Status: AC
  Administered 2021-05-26: 324 mg via ORAL
  Filled 2021-05-26: qty 4

## 2021-05-26 MED ORDER — NITROGLYCERIN 0.4 MG SL SUBL: 0.4000 mg | SUBLINGUAL_TABLET | SUBLINGUAL | Status: DC | PRN

## 2021-05-26 MED ORDER — SODIUM CHLORIDE 0.45 % IV SOLN: INTRAVENOUS | Status: DC

## 2021-05-26 MED ORDER — AMLODIPINE BESYLATE 5 MG PO TABS
5.0000 mg | ORAL_TABLET | Freq: Every day | ORAL | Status: DC
  Administered 2021-05-27 – 2021-05-31 (×5): 5 mg via ORAL
  Filled 2021-05-26 (×6): qty 1

## 2021-05-26 MED ORDER — MUPIROCIN 2 % EX OINT
1.0000 "application " | TOPICAL_OINTMENT | Freq: Two times a day (BID) | CUTANEOUS | Status: DC
  Administered 2021-05-26 – 2021-05-31 (×7): 1 via TOPICAL
  Filled 2021-05-26 (×2): qty 22

## 2021-05-26 MED ORDER — PANTOPRAZOLE SODIUM 40 MG IV SOLR
40.0000 mg | Freq: Once | INTRAVENOUS | Status: AC
  Administered 2021-05-26: 40 mg via INTRAVENOUS
  Filled 2021-05-26: qty 40

## 2021-05-26 MED ORDER — ESCITALOPRAM OXALATE 10 MG PO TABS
5.0000 mg | ORAL_TABLET | Freq: Every day | ORAL | Status: DC
  Administered 2021-05-27 – 2021-05-29 (×3): 5 mg via ORAL
  Filled 2021-05-26: qty 1
  Filled 2021-05-26 (×3): qty 0.5

## 2021-05-26 MED ORDER — ACETAMINOPHEN 650 MG RE SUPP
650.0000 mg | Freq: Four times a day (QID) | RECTAL | Status: DC | PRN
  Filled 2021-05-26: qty 1

## 2021-05-26 MED ORDER — INSULIN ASPART 100 UNIT/ML IJ SOLN: 3.0000 [IU] | Freq: Three times a day (TID) | INTRAMUSCULAR | Status: DC

## 2021-05-26 NOTE — ED Notes (Signed)
RN to bedside to introduce self to pt. Pt in no acute distress. Pt husband at bedside.

## 2021-05-26 NOTE — ED Provider Notes (Signed)
Emergency Medicine Provider Triage Evaluation Note  Kelli [name] , a 60 y.o. female  was evaluated in triage.  Pt complains of headache, nausea, vomiting, chest pain, shortness of breath and epigastric pain.  Recent upper endoscopy last Monday.  Hickman catheter placed 2 weeks ago.  Review of Systems  Positive: Headache, nausea, vomiting, chest pain, shortness of breath, epigastric pain and constipation Negative: Dizziness, weakness, syncope visual changes, blood in her vomit, or blood in her stool  Physical Exam  BP (!) 190/100   Pulse 87   Temp 98 F (36.7 C)   Resp 18   SpO2 100%  Gen:   Awake, appears uncomfortable but in no distress   Resp:   Normal effort CV:  Normal rate and rhythm Abd:  Guarding, tender in the epigastric area.  Active bowel sounds  Medical Decision Making  Medically screening exam initiated at 10:28 AM.  Appropriate orders placed.  Kelli [name] was informed that the remainder of the evaluation will be completed by another provider, this initial triage assessment does not replace that evaluation, and the importance of remaining in the ED until their evaluation is complete.     Jearld Fenton, NP 05/26/21 1031    Lucrezia Starch, MD 05/26/21 [phone]

## 2021-05-26 NOTE — ED Notes (Signed)
Pt complains of nausea, given reglan, unable to take home  medications since yesterday

## 2021-05-26 NOTE — H&P (Signed)
History and Physical    Kelli [name]:[id number] DOB: 1960-11-19 DOA: 05/26/2021  PCP: Glean Hess, MD (Confirm with patient/family/NH records and if not entered, this has to be entered at [facility] point of entry) Patient coming from: [facility]  I have personally briefly reviewed patient's old medical records in [place]  Chief Complaint: chest/epigastric pain  HPI: Kelli [name] is a 60 y.o. female with medical history significant of DM, HDL, CKD, peripheral neuropathy, depression, and recent endoscopy on 9/28 with findings concerning for reflux esophagitis who presents for assessment of chest pain, upper abdominal pain and some intractable nausea and vomiting stating she cannot keep down the Reglan she was prescribed for this.  Patient states she had some issues with this going back several months, 12 admissions in the last year,  before the endoscopy but feels it is particular worse over the last week and has not really keep any fluids down whatsoever.  States she feels a little constipated and still make some urine but has not had any burning with urination or diarrhea.  She endorses nonproductive cough that is also going on for several weeks and is not sure if it is different today.  No new fevers, earache, sore throat, rash or extremity pain.  No recent falls or injuries.  States he also has not been able to keep her blood pressure medicines down.  No other acute concerns at this time.  She has not yet been started on dialysis but recently had a tunnel cath placed with anticipation she will need dialysis.   ED Course: T 98  176/75  HR 87  RR 16. CT abd/pelvis - no acute process. CXR- NAD. Lab: glucose 260, Cr 4.93 (4.13 baseline) and preparing for HD. WBC 9.8, Hgb 9.8. Patient given 1 L  IVF, given IV protonix, IV reglan. TRH called to admit for continued evaluation and tx.  Review of Systems: As per HPI otherwise 10 point review of systems negative.    Past Medical History:   Diagnosis Date   Chronic kidney disease    Diabetes mellitus without complication ([place])    Migraine    Staph infection     Past Surgical History:  Procedure Laterality Date   ABDOMINAL HYSTERECTOMY     COLONOSCOPY W/ POLYPECTOMY     DIALYSIS/PERMA CATHETER INSERTION N/A 05/13/2021   Procedure: DIALYSIS/PERMA CATHETER INSERTION;  Surgeon: Algernon Huxley, MD;  Location: [facility];  Service: Cardiovascular;  Laterality: N/A;   ESOPHAGOGASTRODUODENOSCOPY  03/02/2017   Normal   HEMORRHOID SURGERY      Soc Hx - married 36 years. Two grown children. Homemaker, unable to work due to [employer], charcot foot. Spouse supportive   reports that she has quit smoking. She has never used smokeless tobacco. She reports that she does not drink alcohol and does not use drugs.  Allergies  Allergen Reactions   Statins Nausea And Vomiting   Albumin Human Other (See Comments)    Refuses all blood products as one of [employer] Refuses all blood products as one of [employer]    Gabapentin Swelling   Hydrochlorothiazide Nausea And Vomiting   Lisinopril Other (See Comments)    fatigue   Lyrica [Pregabalin] Swelling   Metformin And Related Nausea And Vomiting   Penicillins Nausea And Vomiting    Family History  Problem Relation Age of Onset   Heart failure Mother    Diabetes Mother    Breast cancer Neg Hx  Prior to Admission medications   Medication Sig Start Date End Date Taking? Authorizing Provider  dorzolamide-timolol (COSOPT) 22.3-6.8 MG/ML ophthalmic solution Place 1 drop into the left eye 2 (two) times daily. 04/30/21  Yes [provider]  insulin glargine (LANTUS SOLOSTAR) 100 UNIT/ML Solostar Pen Inject 16 Units into the skin daily.   Yes [provider]  insulin lispro (HUMALOG) 100 UNIT/ML injection Inject 0-8 Units into the skin 3 (three) times daily before meals. SSI   Yes [provider]  latanoprost (XALATAN) 0.005 % ophthalmic  solution Place 1 drop into the left eye at bedtime. 05/01/21  Yes [provider]  metoCLOPramide (REGLAN) 10 MG tablet Take 10 mg by mouth every 8 (eight) hours as needed for vomiting or nausea. 04/03/21  Yes [provider]  amLODipine (NORVASC) 5 MG tablet Take 5 mg by mouth daily. 03/07/21   [provider]  BD PEN NEEDLE NANO U/F 32G X 4 MM MISC Inject 1 each into the skin 3 (three) times daily. 09/27/20   [provider]  calcitRIOL (ROCALTROL) 0.25 MCG capsule Take 0.25 mcg by mouth daily. Patient not taking: No sig reported 04/30/21   [provider]  carvedilol (COREG) 12.5 MG tablet Take 1 tablet (12.5 mg total) by mouth 2 (two) times daily. 04/09/21   Glean Hess, MD  Continuous Blood Gluc Receiver (FREESTYLE LIBRE 14 DAY READER) DEVI See admin instructions. 04/06/20   [provider]  Continuous Blood Gluc Sensor (FREESTYLE LIBRE 2 SENSOR) MISC USE AS DIRECTED EVERY 14 DAYS 05/07/21   Glean Hess, MD  escitalopram (LEXAPRO) 5 MG tablet Take 5 mg by mouth daily. 10/12/20   [provider]  glucose blood (FREESTYLE LITE) test strip 1 each by Other route five (5) times a day.    [provider]  losartan (COZAAR) 100 MG tablet Take 1 tablet (100 mg total) by mouth daily. Patient not taking: No sig reported 04/05/21   Glean Hess, MD  mupirocin ointment (BACTROBAN) 2 % Apply 1 application topically 2 (two) times daily. Patient not taking: No sig reported 07/30/20   Glean Hess, MD  ondansetron (ZOFRAN-ODT) 4 MG disintegrating tablet TAKE 1 TABLET (4 MG TOTAL) BY MOUTH AS NEEDED. Patient not taking: No sig reported 02/27/21   Glean Hess, MD  pantoprazole (PROTONIX) 40 MG tablet Take 40 mg by mouth daily. Patient not taking: No sig reported 11/02/20   [provider]  senna (SENOKOT) 8.6 MG tablet Take 1 tablet by mouth daily. 10/25/20   [provider]    Physical Exam: Vitals:    05/26/21 1200 05/26/21 1300 05/26/21 1520 05/26/21 1623  BP: (!) 180/103 (!) 185/167 (!) 190/76 (!) 178/75  Pulse: 89 90 87 84  Resp: 16 16 16 16   Temp:      SpO2: 98% 98% 99% 97%     Vitals:   05/26/21 1200 05/26/21 1300 05/26/21 1520 05/26/21 1623  BP: (!) 180/103 (!) 185/167 (!) 190/76 (!) 178/75  Pulse: 89 90 87 84  Resp: 16 16 16 16   Temp:      SpO2: 98% 98% 99% 97%   General: overweight woman who feels sick and has been vomiting. "Moon facies" noted Eyes: PERRL, lids and conjunctivae normal ENMT: Mucous membranes are moist. Posterior pharynx clear of any exudate or lesions. Neck: normal, supple, no masses, no thyromegaly Respiratory: clear to auscultation bilaterally, no wheezing, no crackles. Normal respiratory effort. No accessory muscle use.  Cardiovascular: Regular rate and rhythm, no murmurs / rubs / gallops. No extremity edema. 2+ pedal pulses. No carotid bruits.  Abdomen: obese, diffuse tenderness worst at epigastrum, no masses palpated. No hepatosplenomegaly. Bowel sounds positive.  Musculoskeletal: no clubbing / cyanosis.  joint deformity  lower extremities with fallen arches. Good ROM, no contractures. Normal muscle tone.  Skin: no rashes, lesions, ulcers. No induration Neurologic: CN 2-12 grossly intact. Sensation intact Strength 5/5 in all 4.  Psychiatric: Normal judgment and insight. Alert and oriented x 3. Depressed mood.     Labs on Admission: I have personally reviewed following labs and imaging studies  CBC: Recent Labs  Lab 05/26/21 1029  WBC 9.8  HGB 9.8*  HCT 28.4*  MCV 86.1  PLT 850   Basic Metabolic Panel: Recent Labs  Lab 05/26/21 1029  NA 139  K 3.6  CL 96*  CO2 29  GLUCOSE 260*  BUN 36*  CREATININE 4.93*  CALCIUM 9.2   GFR: CrCl cannot be calculated (Unknown ideal weight.). Liver Function Tests: Recent Labs  Lab 05/26/21 1029  AST 19  ALT 11  ALKPHOS 55  BILITOT 0.9  PROT 6.0*  ALBUMIN 3.4*   Recent Labs  Lab  05/26/21 1029  LIPASE 22   No results for input(s): AMMONIA in the last 168 hours. Coagulation Profile: No results for input(s): INR, PROTIME in the last 168 hours. Cardiac Enzymes: No results for input(s): CKTOTAL, CKMB, CKMBINDEX, TROPONINI in the last 168 hours. BNP (last 3 results) No results for input(s): PROBNP in the last 8760 hours. HbA1C: No results for input(s): HGBA1C in the last 72 hours. CBG: Recent Labs  Lab 05/26/21 1621  GLUCAP 191*   Lipid Profile: No results for input(s): CHOL, HDL, LDLCALC, TRIG, CHOLHDL, LDLDIRECT in the last 72 hours. Thyroid Function Tests: No results for input(s): TSH, T4TOTAL, FREET4, T3FREE, THYROIDAB in the last 72 hours. Anemia Panel: No results for input(s): VITAMINB12, FOLATE, FERRITIN, TIBC, IRON, RETICCTPCT in the last 72 hours. Urine analysis:    Component Value Date/Time   COLORURINE YELLOW (A) 05/26/2021 1622   APPEARANCEUR CLEAR (A) 05/26/2021 1622   LABSPEC 1.017 05/26/2021 1622   PHURINE 6.0 05/26/2021 1622   GLUCOSEU >=500 (A) 05/26/2021 1622   HGBUR NEGATIVE 05/26/2021 1622   BILIRUBINUR NEGATIVE 05/26/2021 1622   BILIRUBINUR neg 02/15/2021 1501   KETONESUR 5 (A) 05/26/2021 1622   PROTEINUR >=300 (A) 05/26/2021 1622   UROBILINOGEN 0.2 02/15/2021 1501   NITRITE NEGATIVE 05/26/2021 1622   LEUKOCYTESUR NEGATIVE 05/26/2021 1622    Radiological Exams on Admission: CT ABDOMEN PELVIS WO CONTRAST  Result Date: 05/26/2021 CLINICAL DATA:  Abdominal pain and vomiting. EXAM: CT ABDOMEN AND PELVIS WITHOUT CONTRAST TECHNIQUE: Multidetector CT imaging of the abdomen and pelvis was performed following the standard protocol without IV contrast. COMPARISON:  None. FINDINGS: Lower chest: No acute abnormality. Hepatobiliary: No focal liver abnormality is seen. No gallstones, gallbladder wall thickening, or biliary dilatation. Pancreas: Unremarkable. No pancreatic ductal dilatation or surrounding inflammatory changes. Spleen: Normal in  size without focal abnormality. Adrenals/Urinary Tract: The left adrenal gland is unremarkable. 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 No renal mass, calculi, or hydronephrosis. The bladder is unremarkable for the degree of distention. Stomach/Bowel: Small hiatal hernia. The stomach is otherwise within normal limits. No bowel wall thickening, distention, or surrounding inflammatory changes. Normal appendix. Vascular/Lymphatic: Aortic atherosclerosis. No enlarged abdominal or pelvic lymph nodes. Reproductive: Status post hysterectomy. No adnexal masses. Other: No free fluid or pneumoperitoneum. Musculoskeletal: No acute or  significant osseous findings. IMPRESSION: 1. No acute intra-abdominal process. 2. 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 3. Aortic Atherosclerosis (ICD10-I70.0). Electronically Signed   By: Titus Dubin M.D.   On: 05/26/2021 13:05   DG Chest 2 View  Result Date: 05/26/2021 CLINICAL DATA:  Vomiting and abdominal pain. Chest pain. Shortness of breath. EXAM: CHEST - 2 VIEW COMPARISON:  None. FINDINGS: The right Vas-Cath terminates near the caval atrial junction. No pneumothorax. The heart, hila, and mediastinum are normal. No pulmonary nodules or masses. No focal infiltrates. No overt edema. IMPRESSION: No active cardiopulmonary disease. Electronically Signed   By: Dorise Bullion III M.D.   On: 05/26/2021 10:53    EKG: Independently reviewed. NSR, non-specific t wave changes. 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 This has been a long standing problem. Reglan helps but she will not be able to take it when having N/V. Claims she has not had GI consultation, only  procedures. Plan Med-surg obs  IV Protonix q 12  IV reglan q6  IV phenergan 6.25 prn  Gastric emptying study.  Trial of carafate suspension qid  May need GI consult if not improved.   2. DM - last A1C 7.8% 02/26/21 Plan Continue lantus  Ss  3. HTN- BP spikes. She has not been able to take her medications. Stopped ARB due to intolerance.  Plan Labetolol 10 mg IV q4 prn SBP > 170  Continue home meds  4. CKD - patients Cr is stable. She has a subclavian catheter for HD but hasn't started HD. She is unaware of long-term access creation. She states she hope for transplant - husband is being screen as living donor. Plan IV hydration 1/2NS at 100 cc/hr  5. Depression - continue lexapro  DVT prophylaxis: heparin SQ  Code Status: full code  Family Communication: husband present during exam  Disposition Plan: Home when stable - 24-48 hrs  Consults called: none Admission status: obs    Adella Hare MD [facility] Pager [phone]  If 7PM-7AM, please contact night-coverage [email or web address] Password [email or web address]  05/26/2021, 5:40 PM

## 2021-05-26 NOTE — ED Notes (Signed)
Provider at bedside

## 2021-05-26 NOTE — ED Triage Notes (Signed)
Pt comes with c/o vomiting, abdominal pain and CP. Pt also states SOb.  Pt states recent endoscopy last Monday and has since been vomiting.

## 2021-05-26 NOTE — ED Provider Notes (Signed)
[facility] Emergency Department Provider Note  ____________________________________________   Event Date/Time   First MD Initiated Contact with Patient 05/26/21 1152     (approximate)  I have reviewed the triage vital signs and the nursing notes.   HISTORY  Chief Complaint Abdominal Pain   HPI Kelli [name] is a 60 y.o. female with a past medical history of DM, HDL, CKD, peripheral neuropathy, depression, and recent endoscopy on 9/28 with findings concerning for reflux esophagitis who presents for assessment of chest pain, upper abdominal pain and some intractable nausea and vomiting stating she cannot keep down the Reglan she was prescribed for this.  Patient states she had some issues with this going back several months before the endoscopy but feels it is particular worse over the last week and has not really keep any fluids down whatsoever.  States she feels a little constipated and still make some urine but has not had any burning with urination or diarrhea.  She endorses nonproductive cough that is also going on for several weeks and is not sure if it is different today.  No new fevers, earache, sore throat, rash or extremity pain.  No recent falls or injuries.  States he also has not been able to keep her blood pressure medicines down.  No other acute concerns at this time.  She has not yet been started on dialysis but recently had a tunnel cath placed with anticipation she will need dialysis.         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, left eye, 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   Neovascular glaucoma of left eye, 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   Mixed hyperlipidemia 06/30/2019   Hypomagnesemia 06/30/2019   Microalbuminuria 09/24/2018   Neuropathy of left foot 03/18/2017   Arthrosis of midfoot, left 03/18/2017   Charcot foot due to diabetes mellitus ([place]) 02/09/2017   Venous insufficiency of both lower extremities 02/09/2017   Chronic idiopathic constipation 01/26/2017   Intractable vomiting with nausea 01/26/2017   Abnormal thyroid blood test 09/30/2016   Essential hypertension 08/21/2016   Adrenal mass ([place]) 08/18/2016   Temporomandibular joint pain 11/02/2014   Moderate episode of recurrent major depressive disorder ([place]) 11/02/2014   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[place]) 06/28/2014   Eustachian tube dysfunction 05/10/2014   Hyperlipidemia 10/03/2013   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 Authorizing Provider  amLODipine (NORVASC) 5 MG tablet Take 5 mg by mouth daily. 03/07/21   [provider]  BD PEN NEEDLE NANO U/F 32G X 4 MM MISC Inject 1 each into the skin 3 (three) times daily. 09/27/20   [provider]  carvedilol (COREG) 12.5 MG tablet Take 1 tablet (12.5 mg total) by  mouth 2 (two) times daily. 04/09/21   Glean Hess, MD  Continuous Blood Gluc Receiver (FREESTYLE LIBRE 14 DAY READER) DEVI See admin instructions. 04/06/20   [provider]  Continuous Blood Gluc Sensor (FREESTYLE LIBRE 2 SENSOR) MISC USE AS DIRECTED EVERY 14 DAYS 05/07/21   Glean Hess, MD  escitalopram (LEXAPRO) 5 MG tablet Take 5 mg by mouth daily. 10/12/20   [provider]  glucose blood (FREESTYLE LITE) test strip 1 each by Other route five (5) times a day.    [provider]  insulin glargine (LANTUS SOLOSTAR) 100 UNIT/ML Solostar Pen Inject 16 Units into the skin daily.    [provider]  insulin lispro (HUMALOG) 100 UNIT/ML injection Inject 5 Units into the skin 3 (three) times daily before meals. SSI    [provider]  losartan (COZAAR) 100 MG tablet Take 1 tablet (100 mg total) by mouth daily. Patient not taking: No sig reported 04/05/21   Glean Hess, MD  metoCLOPramide (REGLAN) 10 MG tablet Take 10 mg by mouth every 8 (eight) hours as needed for vomiting or nausea. 04/03/21   [provider]  mupirocin ointment (BACTROBAN) 2 % Apply 1 application topically 2 (two) times daily. Patient taking differently: Apply 1 application topically 2 (two) times daily as needed (feet). 07/30/20   Glean Hess, MD  ondansetron (ZOFRAN-ODT) 4 MG disintegrating tablet TAKE 1 TABLET (4 MG TOTAL) BY MOUTH AS NEEDED. Patient not taking: No sig reported 02/27/21   Glean Hess, MD  pantoprazole (PROTONIX) 40 MG tablet Take 40 mg by mouth daily. Patient not taking: No sig reported 11/02/20   [provider]  senna (SENOKOT) 8.6 MG tablet Take 1 tablet by mouth daily. 10/25/20   [provider]    Allergies Statins, Gabapentin, Hydrochlorothiazide, Lisinopril, Lyrica [pregabalin], Metformin and related, and Penicillins  Family History  Problem Relation Age of Onset   Heart failure Mother    Diabetes Mother     Breast cancer Neg Hx     Social History Social History   Tobacco Use   Smoking status: Former   Smokeless tobacco: Never  Scientific laboratory technician Use: Never used  Substance Use Topics   Alcohol use: No   Drug use: No    Review of Systems  Review of Systems  Constitutional:  Negative for chills and fever.  HENT:  Negative for sore throat.   Eyes:  Negative for pain.  Respiratory:  Negative for cough and stridor.   Cardiovascular:  Positive for chest pain.  Gastrointestinal:  Positive for abdominal pain, constipation, nausea and vomiting.  Genitourinary:  Negative for dysuria.  Musculoskeletal:  Negative for myalgias.  Skin:  Negative for rash.  Neurological:  Negative for seizures, loss of consciousness and headaches.  Psychiatric/Behavioral:  Negative for suicidal ideas.   All other systems reviewed and are negative.    ____________________________________________   PHYSICAL EXAM:  VITAL SIGNS: ED Triage Vitals [05/26/21 1027]  Enc Vitals Group     BP (!) 190/100     Pulse Rate 87  Resp 18     Temp 98 F (36.7 C)     Temp src      SpO2 100 %     Weight      Height      Head Circumference      Peak Flow      Pain Score 8     Pain Loc      Pain Edu?      Excl. in [place]?    Vitals:   05/26/21 1200 05/26/21 1300  BP: (!) 180/103 (!) 185/167  Pulse: 89 90  Resp: 16 16  Temp:    SpO2: 98% 98%   Physical Exam Vitals and nursing note reviewed.  Constitutional:      General: She is not in acute distress.    Appearance: She is well-developed. She is ill-appearing.  HENT:     Head: Normocephalic and atraumatic.     Right Ear: External ear normal.     Left Ear: External ear normal.     Nose: Nose normal.     Mouth/Throat:     Mouth: Mucous membranes are dry.  Eyes:     Conjunctiva/sclera: Conjunctivae normal.  Cardiovascular:     Rate and Rhythm: Normal rate and regular rhythm.     Heart sounds: No murmur heard. Pulmonary:     Effort: Pulmonary  effort is normal. No respiratory distress.     Breath sounds: Normal breath sounds.  Abdominal:     Palpations: Abdomen is soft.     Tenderness: There is abdominal tenderness in the epigastric area.  Musculoskeletal:     Cervical back: Neck supple.  Skin:    General: Skin is warm and dry.     Capillary Refill: Capillary refill takes more than 3 seconds.  Neurological:     Mental Status: She is alert and oriented to person, place, and time.  Psychiatric:        Mood and Affect: Mood normal.     ____________________________________________   LABS (all labs ordered are listed, but only abnormal results are displayed)  Labs Reviewed  BASIC METABOLIC PANEL - Abnormal; Notable for the following components:      Result Value   Chloride 96 (*)    Glucose, Bld 260 (*)    BUN 36 (*)    Creatinine, Ser 4.93 (*)    GFR, Estimated 9 (*)    All other components within normal limits  CBC - Abnormal; Notable for the following components:   RBC 3.30 (*)    Hemoglobin 9.8 (*)    HCT 28.4 (*)    All other components within normal limits  HEPATIC FUNCTION PANEL - Abnormal; Notable for the following components:   Total Protein 6.0 (*)    Albumin 3.4 (*)    All other components within normal limits  TROPONIN I (HIGH SENSITIVITY) - Abnormal; Notable for the following components:   Troponin I (High Sensitivity) 76 (*)    All other components within normal limits  TROPONIN I (HIGH SENSITIVITY) - Abnormal; Notable for the following components:   Troponin I (High Sensitivity) 89 (*)    All other components within normal limits  RESP PANEL BY RT-PCR (FLU A&B, COVID) ARPGX2  LIPASE, BLOOD  HEMOGLOBIN A1C  URINALYSIS, COMPLETE (UACMP) WITH MICROSCOPIC   ____________________________________________  EKG  Sinus rhythm with a ventricular rate of 86, normal axis, nonspecific ST change versus artifact in inferior and lateral leads without other clearance of acute  ischemia. ____________________________________________  RADIOLOGY  ED MD interpretation: Chest x-ray shows no focal consolidation, effusion, pneumomediastinum, edema, pneumothorax or other clear acute thoracic process.  CT abdomen pelvis shows no evidence of SBO, pancreatitis, cholecystitis, diverticulitis or other acute abdominopelvic process.  There is a 2.7 cm right adrenal myelo lipoma and some aortic atherosclerosis.  No aneurysm.  No kidney stone.  No perinephric stranding.  Official radiology report(s): CT ABDOMEN PELVIS WO CONTRAST  Result Date: 05/26/2021 CLINICAL DATA:  Abdominal pain and vomiting. EXAM: CT ABDOMEN AND PELVIS WITHOUT CONTRAST TECHNIQUE: Multidetector CT imaging of the abdomen and pelvis was performed following the standard protocol without IV contrast. COMPARISON:  None. FINDINGS: Lower chest: No acute abnormality. Hepatobiliary: No focal liver abnormality is seen. No gallstones, gallbladder wall thickening, or biliary dilatation. Pancreas: Unremarkable. No pancreatic ductal dilatation or surrounding inflammatory changes. Spleen: Normal in size without focal abnormality. Adrenals/Urinary Tract: The left adrenal gland is unremarkable. 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 No renal mass, calculi, or hydronephrosis. The bladder is unremarkable for the degree of distention. Stomach/Bowel: Small hiatal hernia. The stomach is otherwise within normal limits. No bowel wall thickening, distention, or surrounding inflammatory changes. Normal appendix. Vascular/Lymphatic: Aortic atherosclerosis. No enlarged abdominal or pelvic lymph nodes. Reproductive: Status post hysterectomy. No adnexal masses. Other: No free fluid or pneumoperitoneum. Musculoskeletal: No acute or significant osseous findings. IMPRESSION: 1. No acute intra-abdominal process. 2. 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 3. Aortic Atherosclerosis (ICD10-I70.0). Electronically Signed   By: Titus Dubin M.D.   On: 05/26/2021 13:05    DG Chest 2 View  Result Date: 05/26/2021 CLINICAL DATA:  Vomiting and abdominal pain. Chest pain. Shortness of breath. EXAM: CHEST - 2 VIEW COMPARISON:  None. FINDINGS: The right Vas-Cath terminates near the caval atrial junction. No pneumothorax. The heart, hila, and mediastinum are normal. No pulmonary nodules or masses. No focal infiltrates. No overt edema. IMPRESSION: No active cardiopulmonary disease. Electronically Signed   By: Dorise Bullion III M.D.   On: 05/26/2021 10:53    ____________________________________________   PROCEDURES  Procedure(s) performed (including Critical Care):  .1-3 Lead EKG Interpretation Performed by: Lucrezia Starch, MD Authorized by: Lucrezia Starch, MD     Interpretation: non-specific     ECG rate assessment: normal     Ectopy: none     Conduction: normal     ____________________________________________   INITIAL IMPRESSION / ASSESSMENT AND PLAN / ED COURSE      Patient presents with above-stated history exam for assessment of some intractable nausea vomiting chest pain abdominal pain in the setting of recent endoscopy and some chronic nausea and vomiting.  Patient has CKD but has not yet started on dialysis.  On arrival he is hypertensive with a BP of 190/100 otherwise stable vital signs on room air.  She is actively vomiting and has mild tenderness in epigastrium.  She also appears very dehydrated.  Differential occludes ACS, pneumonia, pneumomediastinum for recent endoscopy, gastritis, peptic ulcer disease, gastroparesis, metabolic derangements, cholecystitis, pancreatitis, SBO, kidney stone and cystitis.  Chest X-ray has no evidence of pneumonia, no thorax or pneumomediastinum.   BMP shows creatinine of 4.93 compared to 4.31-month ago.  Glucose is elevated to 60.  No evidence of acidosis or other significant electrolyte or metabolic derangements.  CBC shows no leukocytosis and hemoglobin of 9.8 compared to 11.7 hepatic function panel  has no evidence of cholestasis or hepatitis.  Lipase not consistent with pancreatitis.  EKG with multiple nonspecific changes and troponin is elevated at 76.  Repeat troponin is 89.  Patient given ASA and some sublingual nitroglycerin as well as some labetalol for her blood pressure.  She was given some Reglan for Zofran on reassessment stated she was feeling much better and was able tolerate some p.o.  CT abdomen pelvis shows no evidence of SBO, pancreatitis, cholecystitis, diverticulitis or other acute abdominopelvic process.  There is a 2.7 cm right adrenal myelo lipoma and some aortic atherosclerosis.  No aneurysm.  No kidney stone.  No perinephric stranding.  And elevated troponin and evidence of AKI on CKD I think it is reasonable for her to have patient admitted for observation for continued hydration and to ensure troponins are trending down.  I will plan to admit to medicine service for further evaluation and management.  Of note patient unfortunately had her sterile dressing fell off her recently placed tunneled cath.  Site appears relatively clean dry without surrounding induration.  We will clean this with chlorhexidine and place a Tegaderm over it at this time.     ____________________________________________   FINAL CLINICAL IMPRESSION(S) / ED DIAGNOSES  Final diagnoses:  None    Medications  insulin aspart (novoLOG) injection 0-15 Units (8 Units Subcutaneous Given 05/26/21 1256)  nitroGLYCERIN (NITROSTAT) SL tablet 0.4 mg (has no administration in time range)  lactated ringers bolus 1,000 mL (1,000 mLs Intravenous New Bag/Given 05/26/21 1252)  metoCLOPramide (REGLAN) injection 10 mg (10 mg Intravenous Given 05/26/21 1253)  aspirin chewable tablet 324 mg (324 mg Oral Given 05/26/21 1253)  labetalol (NORMODYNE) injection 10 mg (10 mg Intravenous Given 05/26/21 1319)     ED Discharge Orders     None        Note:  This document was prepared using Dragon voice recognition  software and may include unintentional dictation errors.    Lucrezia Starch, MD 05/26/21 [phone]

## 2021-05-27 ENCOUNTER — Encounter: Payer: Self-pay | Admitting: Internal Medicine

## 2021-05-27 DIAGNOSIS — R1013 Epigastric pain: Secondary | ICD-10-CM | POA: Diagnosis present

## 2021-05-27 DIAGNOSIS — R1110 Vomiting, unspecified: Secondary | ICD-10-CM | POA: Diagnosis not present

## 2021-05-27 DIAGNOSIS — E1043 Type 1 diabetes mellitus with diabetic autonomic (poly)neuropathy: Secondary | ICD-10-CM | POA: Diagnosis present

## 2021-05-27 DIAGNOSIS — N179 Acute kidney failure, unspecified: Secondary | ICD-10-CM | POA: Diagnosis present

## 2021-05-27 DIAGNOSIS — Z992 Dependence on renal dialysis: Secondary | ICD-10-CM | POA: Diagnosis not present

## 2021-05-27 DIAGNOSIS — E1065 Type 1 diabetes mellitus with hyperglycemia: Secondary | ICD-10-CM | POA: Diagnosis present

## 2021-05-27 DIAGNOSIS — Z8249 Family history of ischemic heart disease and other diseases of the circulatory system: Secondary | ICD-10-CM | POA: Diagnosis not present

## 2021-05-27 DIAGNOSIS — K5900 Constipation, unspecified: Secondary | ICD-10-CM | POA: Diagnosis not present

## 2021-05-27 DIAGNOSIS — R079 Chest pain, unspecified: Secondary | ICD-10-CM | POA: Diagnosis not present

## 2021-05-27 DIAGNOSIS — E876 Hypokalemia: Secondary | ICD-10-CM | POA: Diagnosis present

## 2021-05-27 DIAGNOSIS — E1143 Type 2 diabetes mellitus with diabetic autonomic (poly)neuropathy: Secondary | ICD-10-CM | POA: Diagnosis present

## 2021-05-27 DIAGNOSIS — I120 Hypertensive chronic kidney disease with stage 5 chronic kidney disease or end stage renal disease: Secondary | ICD-10-CM | POA: Diagnosis present

## 2021-05-27 DIAGNOSIS — E785 Hyperlipidemia, unspecified: Secondary | ICD-10-CM | POA: Diagnosis present

## 2021-05-27 DIAGNOSIS — E1022 Type 1 diabetes mellitus with diabetic chronic kidney disease: Secondary | ICD-10-CM | POA: Diagnosis not present

## 2021-05-27 DIAGNOSIS — A5216 Charcot's arthropathy (tabetic): Secondary | ICD-10-CM | POA: Diagnosis present

## 2021-05-27 DIAGNOSIS — F331 Major depressive disorder, recurrent, moderate: Secondary | ICD-10-CM | POA: Diagnosis present

## 2021-05-27 DIAGNOSIS — K2100 Gastro-esophageal reflux disease with esophagitis, without bleeding: Secondary | ICD-10-CM | POA: Diagnosis present

## 2021-05-27 DIAGNOSIS — R739 Hyperglycemia, unspecified: Secondary | ICD-10-CM

## 2021-05-27 DIAGNOSIS — R778 Other specified abnormalities of plasma proteins: Secondary | ICD-10-CM | POA: Diagnosis present

## 2021-05-27 DIAGNOSIS — R112 Nausea with vomiting, unspecified: Secondary | ICD-10-CM

## 2021-05-27 DIAGNOSIS — D631 Anemia in chronic kidney disease: Secondary | ICD-10-CM | POA: Diagnosis present

## 2021-05-27 DIAGNOSIS — E1042 Type 1 diabetes mellitus with diabetic polyneuropathy: Secondary | ICD-10-CM | POA: Diagnosis present

## 2021-05-27 DIAGNOSIS — D1779 Benign lipomatous neoplasm of other sites: Secondary | ICD-10-CM | POA: Diagnosis present

## 2021-05-27 DIAGNOSIS — N186 End stage renal disease: Secondary | ICD-10-CM | POA: Diagnosis present

## 2021-05-27 DIAGNOSIS — K3184 Gastroparesis: Secondary | ICD-10-CM | POA: Diagnosis present

## 2021-05-27 DIAGNOSIS — E1122 Type 2 diabetes mellitus with diabetic chronic kidney disease: Secondary | ICD-10-CM | POA: Diagnosis present

## 2021-05-27 DIAGNOSIS — H409 Unspecified glaucoma: Secondary | ICD-10-CM | POA: Diagnosis present

## 2021-05-27 DIAGNOSIS — Z20822 Contact with and (suspected) exposure to covid-19: Secondary | ICD-10-CM | POA: Diagnosis present

## 2021-05-27 DIAGNOSIS — N2581 Secondary hyperparathyroidism of renal origin: Secondary | ICD-10-CM | POA: Diagnosis present

## 2021-05-27 DIAGNOSIS — E1142 Type 2 diabetes mellitus with diabetic polyneuropathy: Secondary | ICD-10-CM | POA: Diagnosis present

## 2021-05-27 DIAGNOSIS — E860 Dehydration: Secondary | ICD-10-CM | POA: Diagnosis present

## 2021-05-27 DIAGNOSIS — E1165 Type 2 diabetes mellitus with hyperglycemia: Secondary | ICD-10-CM | POA: Diagnosis present

## 2021-05-27 DIAGNOSIS — Z794 Long term (current) use of insulin: Secondary | ICD-10-CM | POA: Diagnosis not present

## 2021-05-27 LAB — GLUCOSE, CAPILLARY
Glucose-Capillary: 179 mg/dL — ABNORMAL HIGH (ref 70–99)
Glucose-Capillary: 225 mg/dL — ABNORMAL HIGH (ref 70–99)

## 2021-05-27 LAB — HEMOGLOBIN A1C
Hgb A1c MFr Bld: 7.5 % — ABNORMAL HIGH (ref 4.8–5.6)
Mean Plasma Glucose: 169 mg/dL

## 2021-05-27 LAB — CBG MONITORING, ED
Glucose-Capillary: 179 mg/dL — ABNORMAL HIGH (ref 70–99)
Glucose-Capillary: 211 mg/dL — ABNORMAL HIGH (ref 70–99)
Glucose-Capillary: 229 mg/dL — ABNORMAL HIGH (ref 70–99)
Glucose-Capillary: 244 mg/dL — ABNORMAL HIGH (ref 70–99)

## 2021-05-27 LAB — PHOSPHORUS: Phosphorus: 3.1 mg/dL (ref 2.5–4.6)

## 2021-05-27 LAB — MAGNESIUM: Magnesium: 2.2 mg/dL (ref 1.7–2.4)

## 2021-05-27 LAB — BASIC METABOLIC PANEL
Anion gap: 8 (ref 5–15)
BUN: 32 mg/dL — ABNORMAL HIGH (ref 8–23)
CO2: 29 mmol/L (ref 22–32)
Calcium: 8.4 mg/dL — ABNORMAL LOW (ref 8.9–10.3)
Chloride: 98 mmol/L (ref 98–111)
Creatinine, Ser: 4.51 mg/dL — ABNORMAL HIGH (ref 0.44–1.00)
GFR, Estimated: 11 mL/min — ABNORMAL LOW (ref >60)
Glucose, Bld: 236 mg/dL — ABNORMAL HIGH (ref 70–99)
Potassium: 3.3 mmol/L — ABNORMAL LOW (ref 3.5–5.1)
Sodium: 135 mmol/L (ref 135–145)

## 2021-05-27 LAB — IRON AND TIBC
Iron: 40 ug/dL (ref 28–170)
Saturation Ratios: 15 % (ref 10.4–31.8)
TIBC: 270 ug/dL (ref 250–450)
UIBC: 230 ug/dL

## 2021-05-27 LAB — HIV ANTIBODY (ROUTINE TESTING W REFLEX): HIV Screen 4th Generation wRfx: NONREACTIVE

## 2021-05-27 LAB — HEPATITIS B SURFACE ANTIGEN: Hepatitis B Surface Ag: NONREACTIVE

## 2021-05-27 LAB — FERRITIN: Ferritin: 85 ng/mL (ref 11–307)

## 2021-05-27 MED ORDER — INSULIN GLARGINE-YFGN 100 UNIT/ML ~~LOC~~ SOLN
12.0000 [IU] | Freq: Every day | SUBCUTANEOUS | Status: DC
  Filled 2021-05-27 (×3): qty 0.12

## 2021-05-27 MED ORDER — INSULIN ASPART 100 UNIT/ML IJ SOLN
0.0000 [IU] | Freq: Three times a day (TID) | INTRAMUSCULAR | Status: DC
  Administered 2021-05-27: 5 [IU] via SUBCUTANEOUS
  Administered 2021-05-27 – 2021-05-28 (×2): 3 [IU] via SUBCUTANEOUS
  Administered 2021-05-28: 11 [IU] via SUBCUTANEOUS
  Administered 2021-05-29: 8 [IU] via SUBCUTANEOUS
  Filled 2021-05-27 (×6): qty 1

## 2021-05-27 MED ORDER — HEPARIN SODIUM (PORCINE) 1000 UNIT/ML IJ SOLN
INTRAMUSCULAR | Status: AC
  Filled 2021-05-27: qty 1

## 2021-05-27 MED ORDER — INSULIN ASPART 100 UNIT/ML IJ SOLN: 0.0000 [IU] | Freq: Three times a day (TID) | INTRAMUSCULAR | Status: DC

## 2021-05-27 MED ORDER — CHLORHEXIDINE GLUCONATE CLOTH 2 % EX PADS
6.0000 | MEDICATED_PAD | Freq: Every day | CUTANEOUS | Status: DC
  Administered 2021-05-27 – 2021-05-28 (×2): 6 via TOPICAL
  Filled 2021-05-27 (×2): qty 6

## 2021-05-27 NOTE — Progress Notes (Signed)
PROGRESS NOTE    Kelli [name]   ZHY:[id number]  DOB: 07-03-1961  DOA: 05/26/2021 PCP: Glean Hess, MD   Brief Narrative:  Kelli [name] is a 60 year old female with diabetes mellitus, peripheral neuropathy, chronic kidney disease, hyperlipidemia, depression who presents to the ED for epigastric and chest pain associated with nausea and vomiting despite using Reglan at home.  She also describes upper abdominal pain. She further admits to constipation and a dry cough.  In the ED creatinine noted to be 4.93 which is not far from her baseline.  She was given 1 L of IV fluids IV Protonix and IV Reglan and admitted to the hospital.  Of note BP was quite elevated with readings of 190/76, 185/103, 178/75. Gastric emptying study was ordered.   Subjective: The patient states that her nausea has improved.  She has not vomited today.  She was able to have grits for breakfast and states that this is the best that she has eaten in 2 weeks.  She is agreeable to undergo dialysis today but states that she is afraid.    Assessment & Plan:   Principal Problem:   Intractable vomiting with abdominal pain - Gastroparesis secondary to diabetes mellitus -Is feeling better already with IV Reglan-I do not feel that we need to pursue a gastric emptying study as this is most likely secondary to gastroparesis-of note I have been told by the radiology department that gastric emptying studies are only done as outpatient and in the hospital on Fridays -Patient underwent an EGD on 05/22/2021 which revealed LA grade C reflux esophagitis - PPI and pain control -I will DC IV fluids as she is fluid overloaded and will be undergoing her first dialysis treatment today  Active Problems: 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    Essential hypertension -Receives  losartan, amlodipine and carvedilol at home - BP quite elevated in the hospital, possibly due to acute distress in the setting of severe chronic kidney disease and fluid overload -Foley will improved with repeated dialysis treatments    CKD (chronic kidney disease) stage 5, GFR less than 15 ml/min (HCC) -Follows with nephrology as outpatient and has been getting prepared for dialysis -Nephrology has decided that the patient will undergo her first dialysis treatment today - On exam she is clearly fluid overloaded  Depression - Continue Ecitalopram  Time spent in minutes: 35 DVT prophylaxis: heparin injection 5,000 Units Start: 05/26/21 2200  Code Status: Full code Family Communication:  Level of Care: Level of care: Med-Surg Disposition Plan:  Status is: Observation  The patient will require care spanning > 2 midnights and should be moved to inpatient because: IV treatments appropriate due to intensity of illness or inability to take PO  Dispo: The patient is from: Home              Anticipated d/c is to: Home              Patient currently is not medically stable to d/c.   Difficult to place patient No      Consultants:  Nephrology Procedures:   Antimicrobials:  Anti-infectives (From admission, onward)    None        Objective: Vitals:   05/27/21 0434 05/27/21 0530 05/27/21 0600 05/27/21 0630  BP: (!) 182/75 (!) 143/73 (!) 152/99 (!) 135/56  Pulse: 86  86 82   Resp: 17 18    Temp:      TempSrc:      SpO2: 100% 94% 94%     Intake/Output Summary (Last 24 hours) at 05/27/2021 0744 Last data filed at 05/26/2021 1521 Gross per 24 hour  Intake 1000 ml  Output --  Net 1000 ml   There were no vitals filed for this visit.  Examination: General exam: Middle-aged female sitting up in bed in no acute distress-appears comfortable  HEENT: PERRLA, oral mucosa moist, no sclera icterus or thrush-face, specifically cheeks are quite swollen (anasarca) Respiratory system:  Clear to auscultation. Respiratory effort normal. Cardiovascular system: S1 & S2 heard, RRR.   Gastrointestinal system: Abdomen soft, non-tender, nondistended. Normal bowel sounds. Central nervous system: Alert and oriented. No focal neurological deficits. Extremities: No cyanosis, clubbing -subcutaneous edema noted on exam Skin: No rashes or ulcers Psychiatry:  Mood & affect appropriate.     Data Reviewed: I have personally reviewed following labs and imaging studies  CBC: Recent Labs  Lab 05/26/21 1029  WBC 9.8  HGB 9.8*  HCT 28.4*  MCV 86.1  PLT 676   Basic Metabolic Panel: Recent Labs  Lab 05/26/21 1029 05/27/21 0435  NA 139 135  K 3.6 3.3*  CL 96* 98  CO2 29 29  GLUCOSE 260* 236*  BUN 36* 32*  CREATININE 4.93* 4.51*  CALCIUM 9.2 8.4*   GFR: CrCl cannot be calculated (Unknown ideal weight.). Liver Function Tests: Recent Labs  Lab 05/26/21 1029  AST 19  ALT 11  ALKPHOS 55  BILITOT 0.9  PROT 6.0*  ALBUMIN 3.4*   Recent Labs  Lab 05/26/21 1029  LIPASE 22   No results for input(s): AMMONIA in the last 168 hours. Coagulation Profile: No results for input(s): INR, PROTIME in the last 168 hours. Cardiac Enzymes: No results for input(s): CKTOTAL, CKMB, CKMBINDEX, TROPONINI in the last 168 hours. BNP (last 3 results) No results for input(s): PROBNP in the last 8760 hours. HbA1C: Recent Labs    05/26/21 1029  HGBA1C 7.5*   CBG: Recent Labs  Lab 05/26/21 1621 05/26/21 2039 05/26/21 2358 05/27/21 0359  GLUCAP 191* 211* 211* 229*   Lipid Profile: No results for input(s): CHOL, HDL, LDLCALC, TRIG, CHOLHDL, LDLDIRECT in the last 72 hours. Thyroid Function Tests: Recent Labs    05/26/21 1259  TSH 0.619   Anemia Panel: No results for input(s): VITAMINB12, FOLATE, FERRITIN, TIBC, IRON, RETICCTPCT in the last 72 hours. Urine analysis:    Component Value Date/Time   COLORURINE YELLOW (A) 05/26/2021 1622   APPEARANCEUR CLEAR (A) 05/26/2021 1622    LABSPEC 1.017 05/26/2021 1622   PHURINE 6.0 05/26/2021 1622   GLUCOSEU >=500 (A) 05/26/2021 1622   HGBUR NEGATIVE 05/26/2021 1622   BILIRUBINUR NEGATIVE 05/26/2021 1622   BILIRUBINUR neg 02/15/2021 1501   KETONESUR 5 (A) 05/26/2021 1622   PROTEINUR >=300 (A) 05/26/2021 1622   UROBILINOGEN 0.2 02/15/2021 1501   NITRITE NEGATIVE 05/26/2021 1622   LEUKOCYTESUR NEGATIVE 05/26/2021 1622   Sepsis Labs: @LABRCNTIP (procalcitonin:4,lacticidven:4) ) Recent Results (from the past 240 hour(s))  Resp Panel by RT-PCR (Flu A&B, Covid) Nasopharyngeal Swab     Status: None   Collection Time: 05/26/21 12:59 PM   Specimen: Nasopharyngeal Swab; Nasopharyngeal(NP) swabs in vial transport medium  Result Value Ref Range Status   SARS Coronavirus 2 by RT PCR NEGATIVE NEGATIVE Final    Comment: (NOTE) SARS-CoV-2 target nucleic acids are NOT DETECTED.  The SARS-CoV-2 RNA is generally  detectable in upper respiratory specimens during the acute phase of infection. The lowest concentration of SARS-CoV-2 viral copies this assay can detect is 138 copies/mL. A negative result does not preclude SARS-Cov-2 infection and should not be used as the sole basis for treatment or other patient management decisions. A negative result may occur with  improper specimen collection/handling, submission of specimen other than nasopharyngeal swab, presence of viral mutation(s) within the areas targeted by this assay, and inadequate number of viral copies(<138 copies/mL). A negative result must be combined with clinical observations, patient history, and epidemiological information. The expected result is Negative.  Fact Sheet for Patients:  EntrepreneurPulse.com.au  Fact Sheet for Healthcare Providers:  IncredibleEmployment.be  This test is no t yet approved or cleared by the [place] FDA and  has been authorized for detection and/or diagnosis of SARS-CoV-2 by FDA under an  Emergency Use Authorization (EUA). This EUA will remain  in effect (meaning this test can be used) for the duration of the COVID-19 declaration under Section 564(b)(1) of the Act, 21 U.S.C.section 360bbb-3(b)(1), unless the authorization is terminated  or revoked sooner.       Influenza A by PCR NEGATIVE NEGATIVE Final   Influenza B by PCR NEGATIVE NEGATIVE Final    Comment: (NOTE) The Xpert Xpress SARS-CoV-2/FLU/RSV plus assay is intended as an aid in the diagnosis of influenza from Nasopharyngeal swab specimens and should not be used as a sole basis for treatment. Nasal washings and aspirates are unacceptable for Xpert Xpress SARS-CoV-2/FLU/RSV testing.  Fact Sheet for Patients: EntrepreneurPulse.com.au  Fact Sheet for Healthcare Providers: IncredibleEmployment.be  This test is not yet approved or cleared by the [place] FDA and has been authorized for detection and/or diagnosis of SARS-CoV-2 by FDA under an Emergency Use Authorization (EUA). This EUA will remain in effect (meaning this test can be used) for the duration of the COVID-19 declaration under Section 564(b)(1) of the Act, 21 U.S.C. section 360bbb-3(b)(1), unless the authorization is terminated or revoked.  Performed at [facility], [address], [place], [place] [address]          Radiology Studies: CT ABDOMEN PELVIS WO CONTRAST  Result Date: 05/26/2021 CLINICAL DATA:  Abdominal pain and vomiting. EXAM: CT ABDOMEN AND PELVIS WITHOUT CONTRAST TECHNIQUE: Multidetector CT imaging of the abdomen and pelvis was performed following the standard protocol without IV contrast. COMPARISON:  None. FINDINGS: Lower chest: No acute abnormality. Hepatobiliary: No focal liver abnormality is seen. No gallstones, gallbladder wall thickening, or biliary dilatation. Pancreas: Unremarkable. No pancreatic ductal dilatation or surrounding inflammatory changes. Spleen: Normal in  size without focal abnormality. Adrenals/Urinary Tract: The left adrenal gland is unremarkable. 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 No renal mass, calculi, or hydronephrosis. The bladder is unremarkable for the degree of distention. Stomach/Bowel: Small hiatal hernia. The stomach is otherwise within normal limits. No bowel wall thickening, distention, or surrounding inflammatory changes. Normal appendix. Vascular/Lymphatic: Aortic atherosclerosis. No enlarged abdominal or pelvic lymph nodes. Reproductive: Status post hysterectomy. No adnexal masses. Other: No free fluid or pneumoperitoneum. Musculoskeletal: No acute or significant osseous findings. IMPRESSION: 1. No acute intra-abdominal process. 2. 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 3. Aortic Atherosclerosis (ICD10-I70.0). Electronically Signed   By: Titus Dubin M.D.   On: 05/26/2021 13:05   DG Chest 2 View  Result Date: 05/26/2021 CLINICAL DATA:  Vomiting and abdominal pain. Chest pain. Shortness of breath. EXAM: CHEST - 2 VIEW COMPARISON:  None. FINDINGS: The right Vas-Cath terminates near the caval atrial junction. No pneumothorax.  The heart, hila, and mediastinum are normal. No pulmonary nodules or masses. No focal infiltrates. No overt edema. IMPRESSION: No active cardiopulmonary disease. Electronically Signed   By: Dorise Bullion III M.D.   On: 05/26/2021 10:53      Scheduled Meds:  amLODipine  5 mg Oral Daily   carvedilol  12.5 mg Oral BID   escitalopram  5 mg Oral Daily   heparin injection (subcutaneous)  5,000 Units Subcutaneous Q8H   insulin aspart  0-15 Units Subcutaneous Q4H   insulin glargine-yfgn  16 Units Subcutaneous Daily   mupirocin ointment  1 application Topical BID   pantoprazole (PROTONIX) IV  40 mg Intravenous Q12H   Continuous Infusions:  sodium chloride 100 mL/hr at 05/26/21 1941   promethazine (PHENERGAN) injection (IM or IVPB) Stopped (05/27/21 0130)     LOS: 0 days      Debbe Odea, MD [facility] Pager: [email or web address] 05/27/2021, 7:44 AM

## 2021-05-27 NOTE — Progress Notes (Signed)
Tx completed at this time, patient tolerated treatment well. Patient did not remove any fluid. RN Posey Pronto is aware the patient requires a dressing change at this time.

## 2021-05-27 NOTE — Progress Notes (Signed)
[facility]  ROUNDING NOTE   Subjective:   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 However she presents today with continued nausea and vomiting. Seems patient has been undergoing GI work up with [facility]. EGD on 9/28.   Objective:  Vital signs in last 24 hours:  Temp:  [98 F (36.7 C)] 98 F (36.7 C) (10/02 1842) Pulse Rate:  [73-90] 88 (10/03 0800) Resp:  [16-18] 18 (10/03 0800) BP: (135-203)/(56-167) 178/78 (10/03 0800) SpO2:  [91 %-100 %] 91 % (10/03 0800)  Weight change:  There were no vitals filed for this visit.  Intake/Output: I/O last 3 completed shifts: In: 1000 [IV Piggyback:1000] Out: -    Intake/Output this shift:  Total I/O In: 1191.3 [I.V.:1191.3] Out: -   Physical Exam: General: NAD,   Head: Normocephalic, atraumatic. Moist oral mucosal membranes  Eyes: Anicteric, PERRL  Neck: Supple, trachea midline  Lungs:  Clear to auscultation  Heart: Regular rate and rhythm  Abdomen:  Soft, nontender,   Extremities:  + peripheral edema.  Neurologic: Nonfocal, moving all four extremities  Skin: No lesions  Access: None    Basic Metabolic Panel: Recent Labs  Lab 05/26/21 1029 05/27/21 0435  NA 139 135  K 3.6 3.3*  CL 96* 98  CO2 29 29  GLUCOSE 260* 236*  BUN 36* 32*  CREATININE 4.93* 4.51*  CALCIUM 9.2 8.4*    Liver Function Tests: Recent Labs  Lab 05/26/21 1029  AST 19  ALT 11  ALKPHOS 55  BILITOT 0.9  PROT 6.0*  ALBUMIN 3.4*   Recent Labs  Lab 05/26/21 1029  LIPASE 22   No results for input(s): AMMONIA in the last 168 hours.  CBC: Recent Labs  Lab 05/26/21 1029  WBC 9.8  HGB 9.8*  HCT 28.4*  MCV 86.1  PLT 304    Cardiac Enzymes: No results for input(s): CKTOTAL, CKMB, CKMBINDEX, TROPONINI in the last 168 hours.  BNP: Invalid input(s):  POCBNP  CBG: Recent Labs  Lab 05/26/21 1621 05/26/21 2039 05/26/21 2358 05/27/21 0359 05/27/21 0804  GLUCAP 191* 211* 211* 229* 179*    Microbiology: Results for orders placed or performed during the hospital encounter of 05/26/21  Resp Panel by RT-PCR (Flu A&B, Covid) Nasopharyngeal Swab     Status: None   Collection Time: 05/26/21 12:59 PM   Specimen: Nasopharyngeal Swab; Nasopharyngeal(NP) swabs in vial transport medium  Result Value Ref Range Status   SARS Coronavirus 2 by RT PCR NEGATIVE NEGATIVE Final    Comment: (NOTE) SARS-CoV-2 target nucleic acids are NOT DETECTED.  The SARS-CoV-2 RNA is generally detectable in upper respiratory specimens during the acute phase of infection. The lowest concentration of SARS-CoV-2 viral copies this assay can detect is 138 copies/mL. A negative result does not preclude SARS-Cov-2 infection and should not be used as the sole basis for treatment or other patient management decisions. A negative result may occur with  improper specimen collection/handling, submission of specimen other than nasopharyngeal swab, presence of viral mutation(s) within the areas targeted by this assay, and inadequate number of viral copies(<138 copies/mL). A negative result must be combined with clinical observations, patient history, and epidemiological information. The expected result is Negative.  Fact Sheet for Patients:  EntrepreneurPulse.com.au  Fact Sheet for Healthcare Providers:  IncredibleEmployment.be  This test is no t yet approved or cleared  by the [place] and  has been authorized for detection and/or diagnosis of SARS-CoV-2 by FDA under an Emergency Use Authorization (EUA). This EUA will remain  in effect (meaning this test can be used) for the duration of the COVID-19 declaration under Section 564(b)(1) of the Act, 21 U.S.C.section 360bbb-3(b)(1), unless the authorization is terminated  or  revoked sooner.       Influenza A by PCR NEGATIVE NEGATIVE Final   Influenza B by PCR NEGATIVE NEGATIVE Final    Comment: (NOTE) The Xpert Xpress SARS-CoV-2/FLU/RSV plus assay is intended as an aid in the diagnosis of influenza from Nasopharyngeal swab specimens and should not be used as a sole basis for treatment. Nasal washings and aspirates are unacceptable for Xpert Xpress SARS-CoV-2/FLU/RSV testing.  Fact Sheet for Patients: EntrepreneurPulse.com.au  Fact Sheet for Healthcare Providers: IncredibleEmployment.be  This test is not yet approved or cleared by the [place] FDA and has been authorized for detection and/or diagnosis of SARS-CoV-2 by FDA under an Emergency Use Authorization (EUA). This EUA will remain in effect (meaning this test can be used) for the duration of the COVID-19 declaration under Section 564(b)(1) of the Act, 21 U.S.C. section 360bbb-3(b)(1), unless the authorization is terminated or revoked.  Performed at [facility], [address] [place], [place] [address]     Coagulation Studies: No results for input(s): LABPROT, INR in the last 72 hours.  Urinalysis: Recent Labs    05/26/21 1622  COLORURINE YELLOW*  LABSPEC 1.017  PHURINE 6.0  GLUCOSEU >=500*  HGBUR NEGATIVE  BILIRUBINUR NEGATIVE  KETONESUR 5*  PROTEINUR >=300*  NITRITE NEGATIVE  LEUKOCYTESUR NEGATIVE      Imaging: CT ABDOMEN PELVIS WO CONTRAST  Result Date: 05/26/2021 CLINICAL DATA:  Abdominal pain and vomiting. EXAM: CT ABDOMEN AND PELVIS WITHOUT CONTRAST TECHNIQUE: Multidetector CT imaging of the abdomen and pelvis was performed following the standard protocol without IV contrast. COMPARISON:  None. FINDINGS: Lower chest: No acute abnormality. Hepatobiliary: No focal liver abnormality is seen. No gallstones, gallbladder wall thickening, or biliary dilatation. Pancreas: Unremarkable. No pancreatic ductal dilatation or  surrounding inflammatory changes. Spleen: Normal in size without focal abnormality. Adrenals/Urinary Tract: The left adrenal gland is unremarkable. 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 No renal mass, calculi, or hydronephrosis. The bladder is unremarkable for the degree of distention. Stomach/Bowel: Small hiatal hernia. The stomach is otherwise within normal limits. No bowel wall thickening, distention, or surrounding inflammatory changes. Normal appendix. Vascular/Lymphatic: Aortic atherosclerosis. No enlarged abdominal or pelvic lymph nodes. Reproductive: Status post hysterectomy. No adnexal masses. Other: No free fluid or pneumoperitoneum. Musculoskeletal: No acute or significant osseous findings. IMPRESSION: 1. No acute intra-abdominal process. 2. 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 3. Aortic Atherosclerosis (ICD10-I70.0). Electronically Signed   By: Titus Dubin M.D.   On: 05/26/2021 13:05   DG Chest 2 View  Result Date: 05/26/2021 CLINICAL DATA:  Vomiting and abdominal pain. Chest pain. Shortness of breath. EXAM: CHEST - 2 VIEW COMPARISON:  None. FINDINGS: The right Vas-Cath terminates near the caval atrial junction. No pneumothorax. The heart, hila, and mediastinum are normal. No pulmonary nodules or masses. No focal infiltrates. No overt edema. IMPRESSION: No active cardiopulmonary disease. Electronically Signed   By: Dorise Bullion III M.D.   On: 05/26/2021 10:53     Medications:    promethazine (PHENERGAN) injection (IM or IVPB) Stopped (05/27/21 0828)    amLODipine  5 mg Oral Daily   carvedilol  12.5 mg Oral BID   Chlorhexidine Gluconate Cloth  6 each Topical Q0600   escitalopram  5 mg Oral Daily   heparin injection (subcutaneous)  5,000 Units Subcutaneous Q8H   insulin aspart  0-15 Units Subcutaneous TID WC   insulin glargine-yfgn  12 Units Subcutaneous Daily   mupirocin ointment  1 application Topical BID   pantoprazole (PROTONIX) IV  40 mg Intravenous Q12H   acetaminophen **OR**  acetaminophen, labetalol, nitroGLYCERIN, traZODone  Assessment/ Plan:  [name] is a 60 y.o. 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 Unclear if her current symptoms are due to uremia. However starting dialysis may be of benefit. Will start first treatment of hemodialysis today.   Hypertension with chronic kidney disease: 178/78. Driven by nausea and vomiting. COntinue amlodipine and carvedilol. Holding losartan.   Diabetes mellitus type II with chronic kidney disease: insulin dependent hemoglobin A1c of 7.5%. Not well controlled. Complicated by diabetic gastroparesis.   Anemia of chronic kidney disease: hemoglobin 9.8. normocytic.   Secondary Hyperparathyroidism: Check PTH and phos.    LOS: 0 Akim Watkinson 10/3/202211:51 AM

## 2021-05-28 LAB — HEPATITIS B SURFACE ANTIBODY, QUANTITATIVE: Hep B S AB Quant (Post): <3.1 m[IU]/mL — ABNORMAL LOW (ref >9.9)

## 2021-05-28 LAB — HEPATITIS B CORE ANTIBODY, TOTAL: Hep B Core Total Ab: NONREACTIVE

## 2021-05-28 LAB — GLUCOSE, CAPILLARY
Glucose-Capillary: 332 mg/dL — ABNORMAL HIGH (ref 70–99)
Glucose-Capillary: 178 mg/dL — ABNORMAL HIGH (ref 70–99)
Glucose-Capillary: 69 mg/dL — ABNORMAL LOW (ref 70–99)
Glucose-Capillary: 150 mg/dL — ABNORMAL HIGH (ref 70–99)
Glucose-Capillary: 261 mg/dL — ABNORMAL HIGH (ref 70–99)

## 2021-05-28 LAB — HEPATITIS B SURFACE ANTIBODY,QUALITATIVE: Hep B S Ab: NONREACTIVE

## 2021-05-28 LAB — PARATHYROID HORMONE, INTACT (NO CA): PTH: 89 pg/mL — ABNORMAL HIGH (ref 15–65)

## 2021-05-28 MED ORDER — LABETALOL HCL 5 MG/ML IV SOLN
5.0000 mg | Freq: Once | INTRAVENOUS | Status: AC | PRN
  Administered 2021-05-28: 5 mg via INTRAVENOUS
  Filled 2021-05-28: qty 4

## 2021-05-28 MED ORDER — METOCLOPRAMIDE HCL 5 MG/ML IJ SOLN
5.0000 mg | Freq: Three times a day (TID) | INTRAMUSCULAR | Status: DC
  Administered 2021-05-28 – 2021-05-30 (×6): 5 mg via INTRAVENOUS
  Filled 2021-05-28 (×6): qty 2

## 2021-05-28 MED ORDER — INSULIN GLARGINE-YFGN 100 UNIT/ML ~~LOC~~ SOLN
16.0000 [IU] | Freq: Every day | SUBCUTANEOUS | Status: DC
  Administered 2021-05-28 – 2021-05-31 (×4): 16 [IU] via SUBCUTANEOUS
  Filled 2021-05-28 (×5): qty 0.16

## 2021-05-28 NOTE — Progress Notes (Signed)
[facility]  ROUNDING NOTE   Subjective:   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 However she presents today with continued nausea and vomiting. Seems patient has been undergoing GI work up with [facility]. EGD on 9/28.   Patient seen sitting up in bed Alert and oriented Tolerating meals Reports nausea overnight Denies shortness of breath Reports feeling slightly better since dialysis yesterday  Objective:  Vital signs in last 24 hours:  Temp:  [98.1 F (36.7 C)-98.8 F (37.1 C)] 98.8 F (37.1 C) (10/04 1239) Pulse Rate:  [76-90] 77 (10/04 1330) Resp:  [10-33] 17 (10/04 1330) BP: (136-197)/(61-112) 171/73 (10/04 1330) SpO2:  [96 %-100 %] 97 % (10/04 1245) Weight:  [79 kg-79.3 kg] 79.3 kg (10/04 1234)  Weight change:  Filed Weights   05/27/21 1345 05/27/21 1819 05/28/21 1234  Weight: 79 kg 79 kg 79.3 kg    Intake/Output: I/O last 3 completed shifts: In: 1391.3 [I.V.:1191.3; IV Piggyback:200] Out: 0    Intake/Output this shift:  No intake/output data recorded.  Physical Exam: General: NAD, sitting in bed  Head: Normocephalic, atraumatic. Moist oral mucosal membranes  Eyes: Anicteric,  Lungs:  Clear to auscultation, normal breathing  Heart: Regular rate and rhythm  Abdomen:  Soft, nontender  Extremities:  + peripheral edema.  Neurologic: Nonfocal, moving all four extremities  Skin: No lesions  Access: Rt IJ Permcath    Basic Metabolic Panel: Recent Labs  Lab 05/26/21 1029 05/27/21 0435 05/27/21 1222  NA 139  135  --   K 3.6 3.3*  --   CL 96* 98  --   CO2 29 29  --   GLUCOSE 260* 236*  --   BUN 36* 32*  --   CREATININE 4.93* 4.51*  --   CALCIUM 9.2 8.4*  --   MG  --   --  2.2  PHOS  --   --  3.1     Liver Function Tests: Recent Labs  Lab 05/26/21 1029  AST 19  ALT 11  ALKPHOS 55  BILITOT 0.9  PROT 6.0*  ALBUMIN 3.4*    Recent Labs  Lab 05/26/21 1029  LIPASE 22    No results for input(s): AMMONIA in the last 168 hours.  CBC: Recent Labs  Lab 05/26/21 1029  WBC 9.8  HGB 9.8*  HCT 28.4*  MCV 86.1  PLT 304     Cardiac Enzymes: No results for input(s): CKTOTAL, CKMB, CKMBINDEX, TROPONINI in the last 168 hours.  BNP: Invalid input(s): POCBNP  CBG: Recent Labs  Lab 05/27/21 1159 05/27/21 1840 05/27/21 2111 05/28/21 0754 05/28/21 1125  GLUCAP 244* 179* 225* 332* 178*     Microbiology: Results for orders placed or performed during the hospital encounter of 05/26/21  Resp Panel by RT-PCR (Flu A&B, Covid) Nasopharyngeal Swab     Status: None   Collection Time: 05/26/21 12:59 PM   Specimen: Nasopharyngeal Swab; Nasopharyngeal(NP) swabs in vial transport medium  Result Value Ref Range Status   SARS Coronavirus 2 by RT PCR NEGATIVE NEGATIVE Final    Comment: (NOTE) SARS-CoV-2 target nucleic acids are NOT DETECTED.  The SARS-CoV-2 RNA is  generally detectable in upper respiratory specimens during the acute phase of infection. The lowest concentration of SARS-CoV-2 viral copies this assay can detect is 138 copies/mL. A negative result does not preclude SARS-Cov-2 infection and should not be used as the sole basis for treatment or other patient management decisions. A negative result may occur with  improper specimen collection/handling, submission of specimen other than nasopharyngeal swab, presence of viral mutation(s) within the areas targeted by this assay, and inadequate number of viral copies(<138 copies/mL). A negative result must be combined  with clinical observations, patient history, and epidemiological information. The expected result is Negative.  Fact Sheet for Patients:  EntrepreneurPulse.com.au  Fact Sheet for Healthcare Providers:  IncredibleEmployment.be  This test is no t yet approved or cleared by the [place] FDA and  has been authorized for detection and/or diagnosis of SARS-CoV-2 by FDA under an Emergency Use Authorization (EUA). This EUA will remain  in effect (meaning this test can be used) for the duration of the COVID-19 declaration under Section 564(b)(1) of the Act, 21 U.S.C.section 360bbb-3(b)(1), unless the authorization is terminated  or revoked sooner.       Influenza A by PCR NEGATIVE NEGATIVE Final   Influenza B by PCR NEGATIVE NEGATIVE Final    Comment: (NOTE) The Xpert Xpress SARS-CoV-2/FLU/RSV plus assay is intended as an aid in the diagnosis of influenza from Nasopharyngeal swab specimens and should not be used as a sole basis for treatment. Nasal washings and aspirates are unacceptable for Xpert Xpress SARS-CoV-2/FLU/RSV testing.  Fact Sheet for Patients: EntrepreneurPulse.com.au  Fact Sheet for Healthcare Providers: IncredibleEmployment.be  This test is not yet approved or cleared by the [place] FDA and has been authorized for detection and/or diagnosis of SARS-CoV-2 by FDA under an Emergency Use Authorization (EUA). This EUA will remain in effect (meaning this test can be used) for the duration of the COVID-19 declaration under Section 564(b)(1) of the Act, 21 U.S.C. section 360bbb-3(b)(1), unless the authorization is terminated or revoked.  Performed at [facility], [address] [place], [place] [address]     Coagulation Studies: No results for input(s): LABPROT, INR in the last 72 hours.  Urinalysis: Recent Labs    05/26/21 1622  COLORURINE YELLOW*  LABSPEC 1.017   PHURINE 6.0  GLUCOSEU >=500*  HGBUR NEGATIVE  BILIRUBINUR NEGATIVE  KETONESUR 5*  PROTEINUR >=300*  NITRITE NEGATIVE  LEUKOCYTESUR NEGATIVE       Imaging: No results found.   Medications:      amLODipine  5 mg Oral Daily   carvedilol  12.5 mg Oral BID   Chlorhexidine Gluconate Cloth  6 each Topical Q0600   escitalopram  5 mg Oral Daily   heparin injection (subcutaneous)  5,000 Units Subcutaneous Q8H   insulin aspart  0-15 Units Subcutaneous TID WC   insulin glargine-yfgn  16 Units Subcutaneous Daily   metoCLOPramide (REGLAN) injection  5 mg Intravenous TID AC   mupirocin ointment  1 application Topical BID   pantoprazole (PROTONIX) IV  40 mg Intravenous Q12H   acetaminophen **OR** acetaminophen, labetalol, nitroGLYCERIN, traZODone  Assessment/ Plan:  Ms. KAORU BENDA is a 60 y.o. 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 Unclear if her current symptoms are due to uremia. Dialysis initiated yesterday. Tolerated well. Receiving dialysis today, UF goal 500 mls, as tolerated. Plan for dialysis tomorrow.   Hypertension with chronic kidney disease: 178/78. Driven by nausea and vomiting. Continue amlodipine and carvedilol. Holding losartan.Nausea overnight, but denies vomiting.  Diabetes mellitus type II with chronic kidney disease: insulin dependent hemoglobin A1c of 7.5%. Not well controlled.  Complicated by diabetic gastroparesis. Remains elevated at times during this admission  Anemia of chronic kidney disease: hemoglobin 9.8. normocytic.   Secondary Hyperparathyroidism: PTH-89 Phos 3.1   LOS: 1 Kelli [name] 10/4/20221:38 PM

## 2021-05-28 NOTE — Progress Notes (Signed)
   05/27/21 1440 05/27/21 1445 05/27/21 1500  Vitals  BP (!) 177/82 (!) 164/68 (!) 183/80  MAP (mmHg) 110 97 110  BP Location Right Arm  --   --   BP Method Automatic  --   --   Patient Position (if appropriate) Lying  --   --   Pulse Rate 83 82 82  ECG Heart Rate 83 82 82  Resp 18 17 19   During Hemodialysis Assessment  Blood Flow Rate (mL/min) 200 mL/min 200 mL/min 200 mL/min  Arterial Pressure (mmHg) -50 mmHg -60 mmHg -60 mmHg  Venous Pressure (mmHg) 60 mmHg 60 mmHg 60 mmHg  Transmembrane Pressure (mmHg) 80 mmHg 80 mmHg 80 mmHg  Ultrafiltration Rate (mL/min) 230 mL/min 230 mL/min 230 mL/min  Dialysate Flow Rate (mL/min) 500 ml/min 500 ml/min 500 ml/min  Conductivity: Machine  13.8 13.8 13.8  HD Safety Checks Performed Yes Yes Yes  Intra-Hemodialysis Comments Tx initiated Progressing as prescribed;Tolerated well Progressing as prescribed;Tolerated well    05/27/21 1515 05/27/21 1530 05/27/21 1545  Vitals  BP (!) 143/112 (!) 182/80 (!) 173/81  MAP (mmHg) 108 107 108  BP Location  --   --   --   BP Method  --   --   --   Patient Position (if appropriate)  --   --   --   Pulse Rate 82 83 81  ECG Heart Rate 82 83 81  Resp 14 14 12   During Hemodialysis Assessment  Blood Flow Rate (mL/min) 200 mL/min 200 mL/min 200 mL/min  Arterial Pressure (mmHg) -60 mmHg -60 mmHg -60 mmHg  Venous Pressure (mmHg) 60 mmHg 60 mmHg 60 mmHg  Transmembrane Pressure (mmHg) 80 mmHg 80 mmHg 80 mmHg  Ultrafiltration Rate (mL/min) 230 mL/min 230 mL/min 230 mL/min  Dialysate Flow Rate (mL/min) 500 ml/min 500 ml/min 500 ml/min  Conductivity: Machine  13.8 13.8 13.8  HD Safety Checks Performed Yes Yes Yes  Intra-Hemodialysis Comments Progressing as prescribed Progressing as prescribed Progressing as prescribed

## 2021-05-28 NOTE — Progress Notes (Signed)
   05/27/21 1600 05/27/21 1615 05/27/21 1630  Vitals  Temp  --   --   --   Temp Source  --   --   --   BP (!) 178/78 (!) 174/80 (!) 141/111  MAP (mmHg) 108 107 122  Pulse Rate 81 82 84  ECG Heart Rate 81 82 84  Resp 14 16 16   During Hemodialysis Assessment  Blood Flow Rate (mL/min) 200 mL/min 200 mL/min 200 mL/min  Arterial Pressure (mmHg) -60 mmHg -60 mmHg -60 mmHg  Venous Pressure (mmHg) 60 mmHg 60 mmHg 60 mmHg  Transmembrane Pressure (mmHg) 80 mmHg 80 mmHg 80 mmHg  Ultrafiltration Rate (mL/min) 230 mL/min 230 mL/min 230 mL/min  Dialysate Flow Rate (mL/min) 500 ml/min 500 ml/min 500 ml/min  Conductivity: Machine  13.8 13.8 13.8  HD Safety Checks Performed Yes Yes Yes  Intra-Hemodialysis Comments Progressing as prescribed Progressing as prescribed Progressing as prescribed    05/27/21 1640  Vitals  Temp 98.6 F (37 C)  Temp Source Oral  BP (!) 156/61  MAP (mmHg) 85  Pulse Rate 83  ECG Heart Rate 83  Resp 18  During Hemodialysis Assessment  Blood Flow Rate (mL/min) 200 mL/min  Arterial Pressure (mmHg) -60 mmHg  Venous Pressure (mmHg) 60 mmHg  Transmembrane Pressure (mmHg) 80 mmHg  Ultrafiltration Rate (mL/min) 70 mL/min  Dialysate Flow Rate (mL/min) 50 ml/min  Conductivity: Machine  13.8  HD Safety Checks Performed Yes  Intra-Hemodialysis Comments Tx completed

## 2021-05-28 NOTE — Progress Notes (Signed)
PROGRESS NOTE    Kelli [name]   NOB:[id number]  DOB: 09/29/1960  DOA: 05/26/2021 PCP: Glean Hess, MD   Brief Narrative:  Kelli [name] is a 60 year old female with diabetes mellitus, peripheral neuropathy, chronic kidney disease, hyperlipidemia, depression who presents to the ED for epigastric and chest pain associated with nausea and vomiting despite using Reglan at home.  She also describes upper abdominal pain. She further admits to constipation and a dry cough.  In the ED creatinine noted to be 4.93 which is not far from her baseline.  She was given 1 L of IV fluids IV Protonix and IV Reglan and admitted to the hospital.  Of note BP was quite elevated with readings of 190/76, 185/103, 178/75. Gastric emptying study was ordered.   Subjective: She is intermittently nauseated but is no longer vomiting. She has no other complaints today. She tolerated dialysis well yesterday.   Per RN, she is not wanting to keep on the telemetry. I have advised her that with starting dialysis, her electrolytes might go up and down and we should monitor her heart rhythm for a couple of days. She is in agreement.   Assessment & Plan:   Principal Problem:   Intractable vomiting with abdominal pain - Gastroparesis secondary to diabetes mellitus -Is feeling better already with IV Reglan-I do not feel that we need to pursue a gastric emptying study as this is most likely secondary to gastroparesis-of note I have been told by the radiology department that gastric emptying studies are only done as outpatient and in the hospital on Fridays -Patient underwent an EGD on 05/22/2021 which revealed LA grade C reflux esophagitis - PPI and pain control - she appears to be eating and drinking now but is still having some nausea- this could also be a uremic symptom and may improve with dialysis - cont Reglan TID AC/HS  Active Problems: 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 - A1c noted to be  to be reasonably controlled at 7.5 Currently receiving Semglee and TID Novolog with meals- sugars elevated this AM- will increase insulin    Essential hypertension -Receives losartan, amlodipine and carvedilol at home - BP quite elevated in the hospital, possibly due to acute distress in the setting of severe chronic kidney disease and fluid overload -Foley will improved with repeated dialysis treatments    CKD (chronic kidney disease) stage 5, GFR less than 15 ml/min ([place]) -Follows with nephrology as outpatient and has been getting prepared for dialysis -Nephrology has decided that the patient will undergo her first dialysis treatment today - On exam she is clearly fluid overloaded  Depression - Continue Ecitalopram  Time spent in minutes: 35 DVT prophylaxis: heparin injection 5,000 Units Start: 05/26/21 2200  Code Status: Full code Family Communication:  Level of Care: Level of care: Med-Surg Disposition Plan:  Status is: Observation  The patient will require care spanning > 2 midnights and should be moved to inpatient because: IV treatments appropriate due to intensity of illness or inability to take PO  Dispo: The patient is from: Home              Anticipated d/c is to: Home              Patient currently is not medically stable to d/c.   Difficult to place patient No      Consultants:  Nephrology Procedures:   Antimicrobials:  Anti-infectives (From admission, onward)    None  Objective: Vitals:   05/27/21 1825 05/27/21 2014 05/28/21 0403 05/28/21 0755  BP: (!) 197/94 (!) 160/77 (!) 187/78 (!) 178/74  Pulse: 86 83 90 89  Resp: 18 16 16 16   Temp: 98.4 F (36.9 C) 98.7 F (37.1 C) 98.6 F (37 C) 98.1 F (36.7 C)  TempSrc: Oral Oral Oral Oral  SpO2: 100% 98% 96% 96%  Weight:      Height:        Intake/Output Summary (Last 24 hours) at 05/28/2021 0949 Last data filed at 05/28/2021 0305 Gross per 24 hour  Intake 200 ml  Output 0 ml  Net 200 ml     Filed Weights   05/27/21 1345 05/27/21 1819  Weight: 79 kg 79 kg    Examination: General exam: Appears comfortable  HEENT: PERRLA, oral mucosa moist, no sclera icterus or thrush- fae is still edematous but less than yesterday Respiratory system: Clear to auscultation. Respiratory effort normal. Cardiovascular system: S1 & S2 heard, regular rate and rhythm Gastrointestinal system: Abdomen soft, non-tender, nondistended. Normal bowel sounds   Central nervous system: Alert and oriented. No focal neurological deficits. Extremities: No cyanosis, clubbing or edema Skin: No rashes or ulcers Psychiatry:  Mood & affect appropriate.      Data Reviewed: I have personally reviewed following labs and imaging studies  CBC: Recent Labs  Lab 05/26/21 1029  WBC 9.8  HGB 9.8*  HCT 28.4*  MCV 86.1  PLT 485    Basic Metabolic Panel: Recent Labs  Lab 05/26/21 1029 05/27/21 0435 05/27/21 1222  NA 139 135  --   K 3.6 3.3*  --   CL 96* 98  --   CO2 29 29  --   GLUCOSE 260* 236*  --   BUN 36* 32*  --   CREATININE 4.93* 4.51*  --   CALCIUM 9.2 8.4*  --   MG  --   --  2.2  PHOS  --   --  3.1    GFR: Estimated Creatinine Clearance: 14.2 mL/min (A) (by C-G formula based on SCr of 4.51 mg/dL (H)). Liver Function Tests: Recent Labs  Lab 05/26/21 1029  AST 19  ALT 11  ALKPHOS 55  BILITOT 0.9  PROT 6.0*  ALBUMIN 3.4*    Recent Labs  Lab 05/26/21 1029  LIPASE 22    No results for input(s): AMMONIA in the last 168 hours. Coagulation Profile: No results for input(s): INR, PROTIME in the last 168 hours. Cardiac Enzymes: No results for input(s): CKTOTAL, CKMB, CKMBINDEX, TROPONINI in the last 168 hours. BNP (last 3 results) No results for input(s): PROBNP in the last 8760 hours. HbA1C: Recent Labs    05/26/21 1029  HGBA1C 7.5*    CBG: Recent Labs  Lab 05/27/21 0804 05/27/21 1159 05/27/21 1840 05/27/21 2111 05/28/21 0754  GLUCAP 179* 244* 179* 225* 332*     Lipid Profile: No results for input(s): CHOL, HDL, LDLCALC, TRIG, CHOLHDL, LDLDIRECT in the last 72 hours. Thyroid Function Tests: Recent Labs    05/26/21 1259  TSH 0.619    Anemia Panel: Recent Labs    05/27/21 1222  FERRITIN 85  TIBC 270  IRON 40   Urine analysis:    Component Value Date/Time   COLORURINE YELLOW (A) 05/26/2021 1622   APPEARANCEUR CLEAR (A) 05/26/2021 1622   LABSPEC 1.017 05/26/2021 1622   PHURINE 6.0 05/26/2021 1622   GLUCOSEU >=500 (A) 05/26/2021 1622   HGBUR NEGATIVE 05/26/2021 1622   BILIRUBINUR NEGATIVE 05/26/2021 1622  BILIRUBINUR neg 02/15/2021 1501   KETONESUR 5 (A) 05/26/2021 1622   PROTEINUR >=300 (A) 05/26/2021 1622   UROBILINOGEN 0.2 02/15/2021 1501   NITRITE NEGATIVE 05/26/2021 1622   LEUKOCYTESUR NEGATIVE 05/26/2021 1622   Sepsis Labs: @LABRCNTIP (procalcitonin:4,lacticidven:4) ) Recent Results (from the past 240 hour(s))  Resp Panel by RT-PCR (Flu A&B, Covid) Nasopharyngeal Swab     Status: None   Collection Time: 05/26/21 12:59 PM   Specimen: Nasopharyngeal Swab; Nasopharyngeal(NP) swabs in vial transport medium  Result Value Ref Range Status   SARS Coronavirus 2 by RT PCR NEGATIVE NEGATIVE Final    Comment: (NOTE) SARS-CoV-2 target nucleic acids are NOT DETECTED.  The SARS-CoV-2 RNA is generally detectable in upper respiratory specimens during the acute phase of infection. The lowest concentration of SARS-CoV-2 viral copies this assay can detect is 138 copies/mL. A negative result does not preclude SARS-Cov-2 infection and should not be used as the sole basis for treatment or other patient management decisions. A negative result may occur with  improper specimen collection/handling, submission of specimen other than nasopharyngeal swab, presence of viral mutation(s) within the areas targeted by this assay, and inadequate number of viral copies(<138 copies/mL). A negative result must be combined with clinical  observations, patient history, and epidemiological information. The expected result is Negative.  Fact Sheet for Patients:  EntrepreneurPulse.com.au  Fact Sheet for Healthcare Providers:  IncredibleEmployment.be  This test is no t yet approved or cleared by the [place] FDA and  has been authorized for detection and/or diagnosis of SARS-CoV-2 by FDA under an Emergency Use Authorization (EUA). This EUA will remain  in effect (meaning this test can be used) for the duration of the COVID-19 declaration under Section 564(b)(1) of the Act, 21 U.S.C.section 360bbb-3(b)(1), unless the authorization is terminated  or revoked sooner.       Influenza A by PCR NEGATIVE NEGATIVE Final   Influenza B by PCR NEGATIVE NEGATIVE Final    Comment: (NOTE) The Xpert Xpress SARS-CoV-2/FLU/RSV plus assay is intended as an aid in the diagnosis of influenza from Nasopharyngeal swab specimens and should not be used as a sole basis for treatment. Nasal washings and aspirates are unacceptable for Xpert Xpress SARS-CoV-2/FLU/RSV testing.  Fact Sheet for Patients: EntrepreneurPulse.com.au  Fact Sheet for Healthcare Providers: IncredibleEmployment.be  This test is not yet approved or cleared by the [place] FDA and has been authorized for detection and/or diagnosis of SARS-CoV-2 by FDA under an Emergency Use Authorization (EUA). This EUA will remain in effect (meaning this test can be used) for the duration of the COVID-19 declaration under Section 564(b)(1) of the Act, 21 U.S.C. section 360bbb-3(b)(1), unless the authorization is terminated or revoked.  Performed at [facility], [address] [place],  [address]          Radiology Studies: CT ABDOMEN PELVIS WO CONTRAST  Result Date: 05/26/2021 CLINICAL DATA:  Abdominal pain and vomiting. EXAM: CT ABDOMEN AND PELVIS WITHOUT CONTRAST TECHNIQUE:  Multidetector CT imaging of the abdomen and pelvis was performed following the standard protocol without IV contrast. COMPARISON:  None. FINDINGS: Lower chest: No acute abnormality. Hepatobiliary: No focal liver abnormality is seen. No gallstones, gallbladder wall thickening, or biliary dilatation. Pancreas: Unremarkable. No pancreatic ductal dilatation or surrounding inflammatory changes. Spleen: Normal in size without focal abnormality. Adrenals/Urinary Tract: The left adrenal gland is unremarkable. 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 No renal mass, calculi, or hydronephrosis. The bladder is unremarkable for the degree of distention. Stomach/Bowel: Small hiatal hernia. The stomach  is otherwise within normal limits. No bowel wall thickening, distention, or surrounding inflammatory changes. Normal appendix. Vascular/Lymphatic: Aortic atherosclerosis. No enlarged abdominal or pelvic lymph nodes. Reproductive: Status post hysterectomy. No adnexal masses. Other: No free fluid or pneumoperitoneum. Musculoskeletal: No acute or significant osseous findings. IMPRESSION: 1. No acute intra-abdominal process. 2. 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 3. Aortic Atherosclerosis (ICD10-I70.0). Electronically Signed   By: Titus Dubin M.D.   On: 05/26/2021 13:05   DG Chest 2 View  Result Date: 05/26/2021 CLINICAL DATA:  Vomiting and abdominal pain. Chest pain. Shortness of breath. EXAM: CHEST - 2 VIEW COMPARISON:  None. FINDINGS: The right Vas-Cath terminates near the caval atrial junction. No pneumothorax. The heart, hila, and mediastinum are normal. No pulmonary nodules or masses. No focal infiltrates. No overt edema. IMPRESSION: No active cardiopulmonary disease. Electronically Signed   By: Dorise Bullion III M.D.   On: 05/26/2021 10:53      Scheduled Meds:  amLODipine  5 mg Oral Daily   carvedilol  12.5 mg Oral BID   Chlorhexidine Gluconate Cloth  6 each Topical Q0600   escitalopram  5 mg Oral Daily   heparin  injection (subcutaneous)  5,000 Units Subcutaneous Q8H   insulin aspart  0-15 Units Subcutaneous TID WC   insulin glargine-yfgn  12 Units Subcutaneous Daily   mupirocin ointment  1 application Topical BID   pantoprazole (PROTONIX) IV  40 mg Intravenous Q12H   Continuous Infusions:     LOS: 1 day      Debbe Odea, MD [facility] Pager: [email or web address] 05/28/2021, 9:49 AM

## 2021-05-29 DIAGNOSIS — E1022 Type 1 diabetes mellitus with diabetic chronic kidney disease: Secondary | ICD-10-CM

## 2021-05-29 DIAGNOSIS — Z992 Dependence on renal dialysis: Secondary | ICD-10-CM

## 2021-05-29 DIAGNOSIS — Z794 Long term (current) use of insulin: Secondary | ICD-10-CM

## 2021-05-29 DIAGNOSIS — N186 End stage renal disease: Secondary | ICD-10-CM

## 2021-05-29 LAB — GLUCOSE, CAPILLARY
Glucose-Capillary: 295 mg/dL — ABNORMAL HIGH (ref 70–99)
Glucose-Capillary: 155 mg/dL — ABNORMAL HIGH (ref 70–99)
Glucose-Capillary: 194 mg/dL — ABNORMAL HIGH (ref 70–99)
Glucose-Capillary: 184 mg/dL — ABNORMAL HIGH (ref 70–99)

## 2021-05-29 LAB — HEPATITIS C ANTIBODY: HCV Ab: <0.1 s/co ratio — AB (ref 0.0–0.9)

## 2021-05-29 MED ORDER — INSULIN ASPART 100 UNIT/ML IJ SOLN
0.0000 [IU] | Freq: Every day | INTRAMUSCULAR | Status: DC
Start: 2021-05-29 — End: 2021-05-31
  Administered 2021-05-30: 4 [IU] via SUBCUTANEOUS
  Filled 2021-05-29: qty 1
  Filled 2021-05-29: qty 0.05

## 2021-05-29 MED ORDER — CEFAZOLIN SODIUM-DEXTROSE 1-4 GM/50ML-% IV SOLN
1.0000 g | INTRAVENOUS | Status: AC
  Administered 2021-05-30: 1 g via INTRAVENOUS
  Filled 2021-05-29: qty 50

## 2021-05-29 MED ORDER — CEFAZOLIN (ANCEF) 1 G IV SOLR: 1.0000 g | INTRAVENOUS | Status: DC

## 2021-05-29 MED ORDER — INSULIN ASPART 100 UNIT/ML IJ SOLN
2.0000 [IU] | Freq: Three times a day (TID) | INTRAMUSCULAR | Status: DC
  Administered 2021-05-29 – 2021-05-31 (×3): 2 [IU] via SUBCUTANEOUS
  Filled 2021-05-29: qty 0.02
  Filled 2021-05-29: qty 1
  Filled 2021-05-29 (×2): qty 0.02
  Filled 2021-05-29: qty 1
  Filled 2021-05-29: qty 0.02

## 2021-05-29 MED ORDER — HEPARIN SODIUM (PORCINE) 1000 UNIT/ML IJ SOLN
INTRAMUSCULAR | Status: AC
  Filled 2021-05-29: qty 1

## 2021-05-29 MED ORDER — INSULIN ASPART 100 UNIT/ML IJ SOLN
0.0000 [IU] | Freq: Three times a day (TID) | INTRAMUSCULAR | Status: DC
  Administered 2021-05-29 (×2): 2 [IU] via SUBCUTANEOUS
  Administered 2021-05-30: 5 [IU] via SUBCUTANEOUS
  Administered 2021-05-30: 2 [IU] via SUBCUTANEOUS
  Administered 2021-05-31: 3 [IU] via SUBCUTANEOUS
  Filled 2021-05-29 (×4): qty 1
  Filled 2021-05-29 (×3): qty 0.09
  Filled 2021-05-29: qty 1
  Filled 2021-05-29: qty 0.09

## 2021-05-29 MED ORDER — SENNA 8.6 MG PO TABS
1.0000 | ORAL_TABLET | Freq: Every day | ORAL | Status: DC
  Administered 2021-05-29 – 2021-05-31 (×3): 8.6 mg via ORAL
  Filled 2021-05-29 (×3): qty 1

## 2021-05-29 MED ORDER — CHLORHEXIDINE GLUCONATE CLOTH 2 % EX PADS
6.0000 | MEDICATED_PAD | Freq: Every day | CUTANEOUS | Status: DC
  Administered 2021-05-30 – 2021-05-31 (×2): 6 via TOPICAL

## 2021-05-29 NOTE — Progress Notes (Signed)
Patient is alarming frequently alarming for sleep apnea, patient is stable and alert and holding conversation RN Lucielle is  aware and patient is being monitored closely for further changes.

## 2021-05-29 NOTE — Progress Notes (Signed)
Tx initiated without incident, will attempt to  removed 1kg during treatment. Patient was weighed prior to tx on scale, and is dialyzing in recliner.

## 2021-05-29 NOTE — Progress Notes (Signed)
Noted increase in pt blood pressure, patient is sitting up playing on her cell phone. Will readjust and recheck her blood pressure. RN Rolan Lipa has been notified.

## 2021-05-29 NOTE — Progress Notes (Signed)
Pt is alert and oriented x4 and refuses to wear her tele monitor. Patient states it keeps getting in my way. Monitor has been reapplied several times during the shift. Patient tele status will be on standby at this time.No acute distress is noted at the bedside by this nurse.

## 2021-05-29 NOTE — Progress Notes (Signed)
Noted improvement in patient blood pressure, with cuff adjustment. Will continue to monitor the patient closely.

## 2021-05-29 NOTE — Consult Note (Signed)
Patient ID: Kelli [name], female   DOB: 18-May-1961, 60 y.o.   MRN: [id number]  HPI Kelli [name] is a 60 y.o. female seen in consultation at the request of Dr. Juleen China.  She does have end-stage renal disease and had already been started on hemodialysis.  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  She has been seen by gastroenterology at [facility] and initially she was scheduled for an EGD on September 28. It showed mild esophagitis. She reports that ever since she was admitted to the hospital her GI symptoms have subsided.  She endorses some upper abdominal pain that is intermittent without specific alleviating or aggravating factors and she does have some nausea and some occasional vomiting. She did have a recent CT of the abdomen pelvis that have personally reviewed showing evidence of a right adrenal myelolipoma and no other acute intra-abdominal pathology. 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 Prior hx of abd hysterectomy. CBC shows anemia hemoglobin of 9.8 rest of the CBC is normal, creatinine of 4.93 potassium of 3.6.  HPI  Past Medical History:  Diagnosis Date   Chronic kidney disease    Diabetes mellitus without complication ([place])    Migraine    Staph infection     Past Surgical History:  Procedure Laterality Date   ABDOMINAL HYSTERECTOMY     COLONOSCOPY W/ POLYPECTOMY     DIALYSIS/PERMA CATHETER INSERTION N/A 05/13/2021   Procedure: DIALYSIS/PERMA CATHETER INSERTION;  Surgeon: Algernon Huxley, MD;  Location: [facility];  Service: Cardiovascular;  Laterality: N/A;   ESOPHAGOGASTRODUODENOSCOPY  03/02/2017   Normal   HEMORRHOID SURGERY      Family History  Problem Relation Age of Onset   Heart failure Mother    Diabetes Mother    Breast cancer Neg Hx     Social History Social History   Tobacco Use   Smoking status: Former   Smokeless tobacco: Never  Brewing technologist Use: Never used  Substance Use Topics   Alcohol use: No   Drug use: No    Allergies  Allergen Reactions   Statins Nausea And Vomiting   Albumin Human Other (See Comments)    Refuses all blood products as one of Jehovah's Witnesses Refuses all blood products as one of Jehovah's Witnesses    Gabapentin Swelling   Hydrochlorothiazide Nausea And Vomiting   Lisinopril Other (See Comments)    fatigue   Lyrica [Pregabalin] Swelling   Metformin And Related Nausea And Vomiting   Penicillins Nausea And Vomiting    Current Facility-Administered Medications  Medication Dose Route Frequency Provider Last Rate Last Admin   acetaminophen (TYLENOL) tablet 650 mg  650 mg Oral Q6H PRN Norins, Heinz Knuckles, MD   650 mg at 05/27/21 0813   Or   acetaminophen (TYLENOL) suppository 650 mg  650 mg Rectal Q6H PRN Norins, Heinz Knuckles, MD       amLODipine (NORVASC) tablet 5 mg  5 mg Oral Daily Norins, Heinz Knuckles, MD   5 mg at 05/29/21 1304   carvedilol (COREG) tablet 12.5 mg  12.5 mg Oral BID Norins, Heinz Knuckles, MD   12.5 mg at 05/29/21 1304   Chlorhexidine Gluconate Cloth 2 % PADS 6 each  6 each Topical Q0600 Lavonia Dana, MD   6 each at 05/28/21 0522   [START ON 05/30/2021] Chlorhexidine Gluconate Cloth 2 % PADS 6 each  6  each Topical Q0600 Jazon Jipson F, MD       escitalopram (LEXAPRO) tablet 5 mg  5 mg Oral Daily Norins, Heinz Knuckles, MD   5 mg at 05/29/21 1305   heparin injection 5,000 Units  5,000 Units Subcutaneous Q8H Norins, Heinz Knuckles, MD   5,000 Units at 05/29/21 1310   heparin sodium (porcine) 1000 UNIT/ML injection            insulin aspart (novoLOG) injection 0-5 Units  0-5 Units Subcutaneous QHS Swayze, Ava, DO       insulin aspart (novoLOG) injection 0-9 Units  0-9 Units Subcutaneous TID WC Swayze, Ava, DO   2 Units at 05/29/21 1346   insulin aspart (novoLOG) injection 2 Units  2 Units Subcutaneous TID WC Swayze, Ava, DO   2 Units at 05/29/21 1346   insulin glargine-yfgn (SEMGLEE)  injection 16 Units  16 Units Subcutaneous Daily Debbe Odea, MD   16 Units at 05/29/21 1306   labetalol (NORMODYNE) injection 10 mg  10 mg Intravenous Q2H PRN Norins, Heinz Knuckles, MD   10 mg at 05/27/21 0440   metoCLOPramide (REGLAN) injection 5 mg  5 mg Intravenous TID AC Debbe Odea, MD   5 mg at 05/29/21 1307   mupirocin ointment (BACTROBAN) 2 % 1 application  1 application Topical BID Norins, Heinz Knuckles, MD   1 application at 45/62/56 1347   nitroGLYCERIN (NITROSTAT) SL tablet 0.4 mg  0.4 mg Sublingual Q5 min PRN Lucrezia Starch, MD       pantoprazole (PROTONIX) injection 40 mg  40 mg Intravenous Q12H Norins, Heinz Knuckles, MD   40 mg at 05/29/21 1308   senna (SENOKOT) tablet 8.6 mg  1 tablet Oral Daily Swayze, Ava, DO       traZODone (DESYREL) tablet 25 mg  25 mg Oral QHS PRN Norins, Heinz Knuckles, MD         Review of Systems Full ROS  was asked and was negative except for the information on the HPI  Physical Exam Blood pressure (!) 186/84, pulse 82, temperature 98.5 F (36.9 C), temperature source Oral, resp. rate (!) 23, height 5\' 7"  (1.702 m), weight 78.1 kg, SpO2 97 %. CONSTITUTIONAL:NAD EYES: Pupils are equal, round,  Sclera are non-icteric. EARS, NOSE, MOUTH AND THROAT: She is wearing a mask Hearing is intact to voice. LYMPH NODES:  Lymph nodes in the neck are normal. RESPIRATORY:  Lungs are clear. There is normal respiratory effort, with equal breath sounds bilaterally, and without pathologic use of accessory muscles. CARDIOVASCULAR: Heart is regular without murmurs, gallops, or rubs. GI: The abdomen is  soft, nontender, and nondistended. There are no palpable masses. There is no hepatosplenomegaly. There are normal bowel sounds in all quadrants. GU: Rectal deferred.   MUSCULOSKELETAL: Normal muscle strength and tone. No cyanosis or edema.   SKIN: Turgor is good and there are no pathologic skin lesions or ulcers. NEUROLOGIC: Motor and sensation is grossly normal. Cranial nerves are  grossly intact. PSYCH:  Oriented to person, place and time. Affect is normal.  Data Reviewed  I have personally reviewed the patient's imaging, laboratory findings and medical records.    Assessment/Plan 60 year old female with end-stage renal disease already on dialysis.  She does have diabetes and some abdominal symptoms consistent with gastroparesis or some functional GI disorder.  I had an extensive discussion with the patient regarding options and she is very interested in peritoneal dialysis.  She has already started on hemodialysis.  I discussed with her  the 2 options and different modalities.  Also discussed with her about placement of peritoneal dialysis.  Procedure discussed with the patient in detail.  Risks, benefits and possible complications including but not limited to: Bleeding, infection catheter malfunction, bowel injuries.  She understands and wishes to proceed.  We will make sure that she is n.p.o. after midnight and provide appropriate perioperative antibiotics and schedule her for tomorrow afternoon.    Caroleen Hamman, MD FACS General Surgeon 05/29/2021, 3:22 PM

## 2021-05-29 NOTE — Progress Notes (Signed)
[facility]  ROUNDING NOTE   Subjective:   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 However she presents today with continued nausea and vomiting. Seems patient has been undergoing GI work up with [facility]. EGD on 9/28.   Patient seen and evaluated during dialysis   HEMODIALYSIS FLOWSHEET:  Blood Flow Rate (mL/min): 300 mL/min Arterial Pressure (mmHg): -110 mmHg Venous Pressure (mmHg): 150 mmHg Transmembrane Pressure (mmHg): 70 mmHg Ultrafiltration Rate (mL/min): 500 mL/min Dialysate Flow Rate (mL/min): 500 ml/min Conductivity: Machine : 13.8 Conductivity: Machine : 13.8 Dialysis Fluid Bolus: Normal Saline Bolus Amount (mL): 200 mL  Patient placed in chair for dialysis  Tolerating well   Objective:  Vital signs in last 24 hours:  Temp:  [98.2 F (36.8 C)-98.9 F (37.2 C)] 98.5 F (36.9 C) (10/05 1243) Pulse Rate:  [72-85] 82 (10/05 1247) Resp:  [9-24] 23 (10/05 1247) BP: (121-203)/(67-135) 186/84 (10/05 1230) SpO2:  [93 %-99 %] 97 % (10/05 1247) Weight:  [78.1 kg-79.2 kg] 78.1 kg (10/05 1247)  Weight change: 0.3 kg Filed Weights   05/28/21 1234 05/29/21 0900 05/29/21 1247  Weight: 79.3 kg 79.2 kg 78.1 kg    Intake/Output: I/O last 3 completed shifts: In: 200 [IV Piggyback:200] Out: 500 [Other:500]   Intake/Output this shift:  Total I/O In: -  Out: 1000 [Other:1000]  Physical Exam: General: NAD, sitting in chair  Head: Normocephalic, atraumatic. Moist oral mucosal membranes, facial edema  Eyes: Anicteric   Lungs:  Clear to auscultation, normal breathing  Heart: Regular rate and rhythm  Abdomen:  Soft, nontender  Extremities:  + peripheral edema.  Neurologic: Nonfocal, moving all four extremities  Skin: No lesions  Access: Rt IJ Permcath    Basic Metabolic Panel: Recent Labs  Lab 05/26/21 1029 05/27/21 0435 05/27/21 1222  NA 139 135  --   K 3.6 3.3*  --   CL 96* 98  --   CO2 29 29  --   GLUCOSE 260* 236*  --   BUN 36* 32*  --   CREATININE 4.93* 4.51*  --   CALCIUM 9.2 8.4*  --   MG  --   --  2.2  PHOS  --   --  3.1     Liver Function Tests: Recent Labs  Lab 05/26/21 1029  AST 19  ALT 11  ALKPHOS 55  BILITOT 0.9  PROT 6.0*  ALBUMIN 3.4*    Recent Labs  Lab 05/26/21 1029  LIPASE 22    No results for input(s): AMMONIA in the last 168 hours.  CBC: Recent Labs  Lab 05/26/21 1029  WBC 9.8  HGB 9.8*  HCT 28.4*  MCV 86.1  PLT 304     Cardiac Enzymes: No results for input(s): CKTOTAL, CKMB, CKMBINDEX, TROPONINI in the last 168 hours.  BNP: Invalid input(s): POCBNP  CBG: Recent Labs  Lab 05/28/21 1125 05/28/21 1758 05/28/21 1847 05/28/21 2149 05/29/21 0752  GLUCAP 178* 69* 150* 261* 295*     Microbiology: Results for orders placed or performed during the hospital encounter of 05/26/21  Resp Panel by RT-PCR (Flu A&B, Covid) Nasopharyngeal Swab     Status:  None   Collection Time: 05/26/21 12:59 PM   Specimen: Nasopharyngeal Swab; Nasopharyngeal(NP) swabs in vial transport medium  Result Value Ref Range Status   SARS Coronavirus 2 by RT PCR NEGATIVE NEGATIVE Final    Comment: (NOTE) SARS-CoV-2 target nucleic acids are NOT DETECTED.  The SARS-CoV-2 RNA is generally detectable in upper respiratory specimens during the acute phase of infection. The lowest concentration of SARS-CoV-2 viral copies this assay can detect is 138 copies/mL. A negative result does not preclude SARS-Cov-2 infection and should not be used as the sole basis for  treatment or other patient management decisions. A negative result may occur with  improper specimen collection/handling, submission of specimen other than nasopharyngeal swab, presence of viral mutation(s) within the areas targeted by this assay, and inadequate number of viral copies(<138 copies/mL). A negative result must be combined with clinical observations, patient history, and epidemiological information. The expected result is Negative.  Fact Sheet for Patients:  EntrepreneurPulse.com.au  Fact Sheet for Healthcare Providers:  IncredibleEmployment.be  This test is no t yet approved or cleared by the [place] FDA and  has been authorized for detection and/or diagnosis of SARS-CoV-2 by FDA under an Emergency Use Authorization (EUA). This EUA will remain  in effect (meaning this test can be used) for the duration of the COVID-19 declaration under Section 564(b)(1) of the Act, 21 U.S.C.section 360bbb-3(b)(1), unless the authorization is terminated  or revoked sooner.       Influenza A by PCR NEGATIVE NEGATIVE Final   Influenza B by PCR NEGATIVE NEGATIVE Final    Comment: (NOTE) The Xpert Xpress SARS-CoV-2/FLU/RSV plus assay is intended as an aid in the diagnosis of influenza from Nasopharyngeal swab specimens and should not be used as a sole basis for treatment. Nasal washings and aspirates are unacceptable for Xpert Xpress SARS-CoV-2/FLU/RSV testing.  Fact Sheet for Patients: EntrepreneurPulse.com.au  Fact Sheet for Healthcare Providers: IncredibleEmployment.be  This test is not yet approved or cleared by the [place] FDA and has been authorized for detection and/or diagnosis of SARS-CoV-2 by FDA under an Emergency Use Authorization (EUA). This EUA will remain in effect (meaning this test can be used) for the duration of the COVID-19 declaration under Section 564(b)(1) of the Act, 21  U.S.C. section 360bbb-3(b)(1), unless the authorization is terminated or revoked.  Performed at [facility], [address] [place], [place] [address]     Coagulation Studies: No results for input(s): LABPROT, INR in the last 72 hours.  Urinalysis: Recent Labs    05/26/21 1622  COLORURINE YELLOW*  LABSPEC 1.017  PHURINE 6.0  GLUCOSEU >=500*  HGBUR NEGATIVE  BILIRUBINUR NEGATIVE  KETONESUR 5*  PROTEINUR >=300*  NITRITE NEGATIVE  LEUKOCYTESUR NEGATIVE       Imaging: No results found.   Medications:      amLODipine  5 mg Oral Daily   carvedilol  12.5 mg Oral BID   Chlorhexidine Gluconate Cloth  6 each Topical Q0600   [START ON 05/30/2021] Chlorhexidine Gluconate Cloth  6 each Topical Q0600   escitalopram  5 mg Oral Daily   heparin injection (subcutaneous)  5,000 Units Subcutaneous Q8H   heparin sodium (porcine)       insulin aspart  0-5 Units Subcutaneous QHS   insulin aspart  0-9 Units Subcutaneous TID WC   insulin aspart  2 Units Subcutaneous TID WC   insulin glargine-yfgn  16 Units Subcutaneous Daily   metoCLOPramide (REGLAN) injection  5 mg Intravenous TID AC   mupirocin ointment  1 application Topical BID   pantoprazole (PROTONIX) IV  40 mg Intravenous Q12H   acetaminophen **OR** acetaminophen, labetalol, nitroGLYCERIN, traZODone  Assessment/ Plan:  [name] is a 60 y.o. 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 Unclear if her current symptoms are due to uremia. Dialysis initiated 05/27/21. Received third treatment today, seated in chair. Tolerated well. UF 1L achieved. Discussed plan for discharge with patient. She understands she must have a dialysis chair arranged before discharge. Patient desires peritoneal dialysis. Surgery able to place PD catheter tomorrow. Patient will be discharged on hemodialysis and transition to peritoneal dialysis outpatient. Next dialysis treatment scheduled for Friday  Hypertension with chronic kidney disease: 186/84. Initially believed driven by nausea and vomiting. No recent complaints of nausea and vomiting. Continue amlodipine and carvedilol. Holding losartan.   Diabetes mellitus type II with chronic kidney disease: insulin dependent hemoglobin A1c of 7.5%. Not well controlled. Complicated by diabetic gastroparesis. Remains elevated   Anemia of chronic kidney disease: hemoglobin 9.8. normocytic.   Secondary Hyperparathyroidism: PTH-89 Phos 3.1   LOS: 2 Kyri Shader 10/5/20221:16 PM

## 2021-05-29 NOTE — Progress Notes (Signed)
Inpatient Diabetes Program Recommendations  AACE/ADA: New Consensus Statement on Inpatient Glycemic Control  Target Ranges:  Prepandial:   less than 140 mg/dL      Peak postprandial:   less than 180 mg/dL (1-2 hours)      Critically ill patients:  140 - 180 mg/dL  Results for Kelli [name], Kelli [name] (MRN [id number]) as of 05/29/2021 07:17  Ref. Range 05/28/2021 07:54 05/28/2021 10:43 05/28/2021 11:25 05/28/2021 17:58 05/28/2021 18:47 05/28/2021 21:49  Glucose-Capillary Latest Ref Range: 70 - 99 mg/dL 332 (H)  Novolog 11 units      Semglee 16 units 178 (H)  Novolog 3 units 69 (L) 150 (H) 261 (H)    Review of Glycemic Control  Diabetes history: DM (low c-peptide; treated as DM1) Outpatient Diabetes medications: Lantus 16 units daily, Humalog 0-8 units TID with meals Current orders for Inpatient glycemic control: Semglee 16 units daily, Novolog 0-15 units TID with meals  Inpatient Diabetes Program Recommendations:    Insulin: Please consider decreasing Novolog correction to 0-9 units TID with meals, adding Novolog 0-5 units QHS, and ordering Novolog 2 units TID with meals for meal coverage if patient eats at least 50% of meals.  NOTE: Per Care Everywhere, patient sees [facility] for DM management and seen Dr. Ronnald Ramp on 05/06/21. Per office note on 05/06/21 medication recommendations were: "Please increase Lantus (Glargine) 18 units every morning. Please increase Humalog (Lispro) to 6 units with meals. If your blood sugar is greater than 200, add an additional 1 unit for a total of 7 units with meals.If you are eating a very small meal, take 3 units instead of 6 units."  Thanks, Barnie Alderman, RN, MSN, CDE Diabetes Coordinator Inpatient Diabetes Program [phone] (Team Pager from 8am to 5pm)

## 2021-05-29 NOTE — Plan of Care (Signed)

## 2021-05-29 NOTE — Progress Notes (Signed)
PROGRESS NOTE  PRESLIE Kelli [name]:[id number] DOB: 03-Mar-1961 DOA: 05/26/2021 PCP: Glean Hess, MD  Brief History   Kelli [name] is a 60 year old female with diabetes mellitus, peripheral neuropathy, chronic kidney disease, hyperlipidemia, depression who presents to the ED for epigastric and chest pain associated with nausea and vomiting despite using Reglan at home.  She also describes upper abdominal pain. She further admits to constipation and a dry cough.  In the ED creatinine noted to be 4.93 which is not far from her baseline.  She was given 1 L of IV fluids IV Protonix and IV Reglan and admitted to the hospital.   Of note BP was quite elevated with readings of 190/76, 185/103, 178/75.  The patient was admitted to a telemetry bed. Nephrology was consulted. She was given IV reglan. Nausea and vomiting has resolved. The patient underwent HD on 05/27/2021 for the first time. The patient has decided to go forward with PD. General surgery will place PD catheter tomorrow.   Consultants  Nephrology General Surgery  Procedures  None  Antibiotics   Anti-infectives (From admission, onward)    Start     Dose/Rate Route Frequency Ordered Stop   05/30/21 1100  ceFAZolin (ANCEF) powder 1 g  Status:  Discontinued        1 g Other To Surgery 05/29/21 1537 05/29/21 1548   05/30/21 0600  ceFAZolin (ANCEF) IVPB 1 g/50 mL premix        1 g 100 mL/hr over 30 Minutes Intravenous To Surgery 05/29/21 1549 05/31/21 0600        Subjective  The patient is resting comfortably. No new complaints.   Objective   Vitals:  Vitals:   05/29/21 1247 05/29/21 1543  BP:  (!) 151/77  Pulse: 82 75  Resp: (!) 23 18  Temp:  98.2 F (36.8 C)  SpO2: 97% 99%    Exam:  Constitutional:  The patient is awake, alert, and oriented x 3. No acute distress. Respiratory:  CTA bilaterally, no w/r/r.  Respiratory effort normal. No retractions or accessory muscle use Cardiovascular:  RRR, no  m/r/g No LE extremity edema   Normal pedal pulses Abdomen:  Abdomen appears normal; no tenderness or masses No hernias No HSM Musculoskeletal:  Digits/nails BUE: no clubbing, cyanosis, petechiae, infection exam of joints, bones, muscles of at least one of following: head/neck, RUE, LUE, RLE, LLE   Skin:  No rashes, lesions, ulcers palpation of skin: no induration or nodules Neurologic:  CN 2-12 intact Sensation all 4 extremities intact Moving all extremities Psychiatric:  Mental status: Baseline Mood, affect congruent Orientation to person, place, time  judgment and insight appear intact    I have personally reviewed the following:   Today's Data  Vitals  Lab Data  None today  Imaging  CT abdomen and pelvis CXR  Cardiology Data  EKG  Scheduled Meds:  amLODipine  5 mg Oral Daily   carvedilol  12.5 mg Oral BID   Chlorhexidine Gluconate Cloth  6 each Topical Q0600   [START ON 05/30/2021] Chlorhexidine Gluconate Cloth  6 each Topical Q0600   escitalopram  5 mg Oral Daily   heparin injection (subcutaneous)  5,000 Units Subcutaneous Q8H   heparin sodium (porcine)       insulin aspart  0-5 Units Subcutaneous QHS   insulin aspart  0-9 Units Subcutaneous TID WC   insulin aspart  2 Units Subcutaneous TID WC   insulin glargine-yfgn  16 Units Subcutaneous Daily   metoCLOPramide (  REGLAN) injection  5 mg Intravenous TID AC   mupirocin ointment  1 application Topical BID   pantoprazole (PROTONIX) IV  40 mg Intravenous Q12H   senna  1 tablet Oral Daily   Continuous Infusions:  [START ON 05/30/2021]  ceFAZolin (ANCEF) IV      Principal Problem:   Intractable vomiting Active Problems:   Essential hypertension   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HCC)   Moderate episode of recurrent major depressive disorder (HCC)   Intractable vomiting with nausea   CKD (chronic kidney disease) stage 5, GFR less than 15 ml/min (HCC)   LOS: 2 days   A & P   Intractable  vomiting with abdominal pain - 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  - cont Reglan TID AC/HS. Plan to change to PO for discharge.   Active Problems: 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 - A1c noted to be to be reasonably controlled at 7.5 Currently receiving Semglee and TID Novolog with meals- sugars elevated this AM- will increase insulin     Essential hypertension -Receives losartan, amlodipine and carvedilol at home - BP quite elevated in the hospital, possibly due to acute distress in the setting of severe chronic kidney disease and fluid overload -BP improved with HD.     CKD (chronic kidney disease) stage 5, GFR less than 15 ml/min (HCC) -Follows with nephrology as outpatient and has been getting prepared for dialysis -Nephrology has decided that the patient will undergo her first dialysis treatment today  - The patient has decided to convert to PD. She will go for placement of PD catheter tomorrow. Will need education and to be set up for PD as outpatient.   Depression - Continue Ecitalopram  I have seen and examined this patient myself. I have spent 35 minutes with her evaluation and care.   Time spent in minutes: 35 DVT prophylaxis: heparin injection 5,000 Units Start: 05/26/21 2200  Code Status: Full code Family Communication:  Level of Care: Level of care: Med-Surg Disposition Plan:  Status is: Observation   The patient will require care spanning > 2 midnights and should be moved to inpatient because: IV treatments  appropriate due to intensity of illness or inability to take PO   Dispo: The patient is from: Home              Anticipated d/c is to: Home              Patient currently is not medically stable to d/c.              Difficult to place patient No   Kelli Martinezgarcia, DO [facility] Direct contact: see [email or web address]  7PM-7AM contact night coverage as above 05/29/2021, 4:45 PM  LOS: 2 days

## 2021-05-30 ENCOUNTER — Encounter
Admission: EM | Disposition: A | Discharge status: Home / Self Care | Payer: Self-pay | Source: Home / Self Care | Attending: Internal Medicine

## 2021-05-30 ENCOUNTER — Inpatient Hospital Stay: Payer: BC Managed Care – PPO | Admitting: Anesthesiology

## 2021-05-30 ENCOUNTER — Encounter: Payer: Self-pay | Admitting: Internal Medicine

## 2021-05-30 HISTORY — PX: CAPD INSERTION: SHX5233

## 2021-05-30 LAB — POCT I-STAT, CHEM 8
BUN: 17 mg/dL (ref 8–23)
Calcium, Ion: 1.14 mmol/L — ABNORMAL LOW (ref 1.15–1.40)
Chloride: 99 mmol/L (ref 98–111)
Creatinine, Ser: 3.2 mg/dL — ABNORMAL HIGH (ref 0.44–1.00)
Glucose, Bld: 168 mg/dL — ABNORMAL HIGH (ref 70–99)
HCT: 25 % — ABNORMAL LOW (ref 36.0–46.0)
Hemoglobin: 8.5 g/dL — ABNORMAL LOW (ref 12.0–15.0)
Potassium: 3 mmol/L — ABNORMAL LOW (ref 3.5–5.1)
Sodium: 137 mmol/L (ref 135–145)
TCO2: 26 mmol/L (ref 22–32)

## 2021-05-30 LAB — GLUCOSE, CAPILLARY
Glucose-Capillary: 273 mg/dL — ABNORMAL HIGH (ref 70–99)
Glucose-Capillary: 189 mg/dL — ABNORMAL HIGH (ref 70–99)
Glucose-Capillary: 145 mg/dL — ABNORMAL HIGH (ref 70–99)
Glucose-Capillary: 360 mg/dL — ABNORMAL HIGH (ref 70–99)

## 2021-05-30 LAB — BASIC METABOLIC PANEL
Anion gap: 5 (ref 5–15)
BUN: 18 mg/dL (ref 8–23)
CO2: 30 mmol/L (ref 22–32)
Calcium: 7.6 mg/dL — ABNORMAL LOW (ref 8.9–10.3)
Chloride: 100 mmol/L (ref 98–111)
Creatinine, Ser: 2.67 mg/dL — ABNORMAL HIGH (ref 0.44–1.00)
GFR, Estimated: 20 mL/min — ABNORMAL LOW (ref >60)
Glucose, Bld: 206 mg/dL — ABNORMAL HIGH (ref 70–99)
Potassium: 3.2 mmol/L — ABNORMAL LOW (ref 3.5–5.1)
Sodium: 135 mmol/L (ref 135–145)

## 2021-05-30 SURGERY — INSERTION, CATHETER, CAPD
Anesthesia: General

## 2021-05-30 MED ORDER — PROMETHAZINE HCL 25 MG/ML IJ SOLN: 12.5000 mg | Freq: Once | INTRAMUSCULAR | Status: DC

## 2021-05-30 MED ORDER — BUPIVACAINE LIPOSOME 1.3 % IJ SUSP
INTRAMUSCULAR | Status: AC
  Filled 2021-05-30: qty 20

## 2021-05-30 MED ORDER — LIDOCAINE HCL (CARDIAC) PF 100 MG/5ML IV SOSY
PREFILLED_SYRINGE | INTRAVENOUS | Status: DC | PRN
  Administered 2021-05-30: 100 mg via INTRAVENOUS

## 2021-05-30 MED ORDER — PROMETHAZINE HCL 25 MG/ML IJ SOLN
6.2500 mg | INTRAMUSCULAR | Status: DC | PRN
Start: 2021-05-30 — End: 2021-05-30
  Administered 2021-05-30: 6.25 mg via INTRAVENOUS

## 2021-05-30 MED ORDER — ESCITALOPRAM OXALATE 10 MG PO TABS
5.0000 mg | ORAL_TABLET | Freq: Every day | ORAL | Status: DC
  Administered 2021-05-30 – 2021-05-31 (×2): 5 mg via ORAL
  Filled 2021-05-30 (×2): qty 0.5

## 2021-05-30 MED ORDER — HEPARIN SODIUM (PORCINE) 10000 UNIT/ML IJ SOLN
INTRAMUSCULAR | Status: AC
  Filled 2021-05-30: qty 1

## 2021-05-30 MED ORDER — PROPOFOL 10 MG/ML IV BOLUS
INTRAVENOUS | Status: DC | PRN
Start: 2021-05-30 — End: 2021-05-30
  Administered 2021-05-30: 150 mg via INTRAVENOUS

## 2021-05-30 MED ORDER — ACETAMINOPHEN 10 MG/ML IV SOLN: 1000.0000 mg | Freq: Once | INTRAVENOUS | Status: DC | PRN

## 2021-05-30 MED ORDER — POTASSIUM CHLORIDE CRYS ER 20 MEQ PO TBCR
20.0000 meq | EXTENDED_RELEASE_TABLET | Freq: Once | ORAL | Status: AC
  Administered 2021-05-30: 20 meq via ORAL
  Filled 2021-05-30: qty 1

## 2021-05-30 MED ORDER — FENTANYL CITRATE (PF) 100 MCG/2ML IJ SOLN
INTRAMUSCULAR | Status: AC
  Filled 2021-05-30: qty 2

## 2021-05-30 MED ORDER — FENTANYL CITRATE (PF) 100 MCG/2ML IJ SOLN
INTRAMUSCULAR | Status: DC | PRN
  Administered 2021-05-30: 50 ug via INTRAVENOUS

## 2021-05-30 MED ORDER — FENTANYL CITRATE (PF) 100 MCG/2ML IJ SOLN
25.0000 ug | INTRAMUSCULAR | Status: DC | PRN
  Administered 2021-05-30 (×3): 25 ug via INTRAVENOUS

## 2021-05-30 MED ORDER — LIDOCAINE HCL (PF) 2 % IJ SOLN
INTRAMUSCULAR | Status: AC
  Filled 2021-05-30: qty 5

## 2021-05-30 MED ORDER — METOCLOPRAMIDE HCL 5 MG/ML IJ SOLN
10.0000 mg | Freq: Three times a day (TID) | INTRAMUSCULAR | Status: DC
  Administered 2021-05-30 – 2021-05-31 (×3): 10 mg via INTRAVENOUS
  Filled 2021-05-30 (×3): qty 2

## 2021-05-30 MED ORDER — CEFAZOLIN SODIUM-DEXTROSE 1-4 GM/50ML-% IV SOLN
INTRAVENOUS | Status: AC
  Filled 2021-05-30: qty 50

## 2021-05-30 MED ORDER — ONDANSETRON HCL 4 MG/2ML IJ SOLN
INTRAMUSCULAR | Status: AC
  Filled 2021-05-30: qty 2

## 2021-05-30 MED ORDER — BUPIVACAINE-EPINEPHRINE (PF) 0.25% -1:200000 IJ SOLN
INTRAMUSCULAR | Status: DC | PRN
  Administered 2021-05-30: 50 mL

## 2021-05-30 MED ORDER — SODIUM CHLORIDE 0.9 % IV SOLN
INTRAVENOUS | Status: AC | PRN
  Administered 2021-05-30 (×2): 1000 mL

## 2021-05-30 MED ORDER — OXYCODONE HCL 5 MG PO TABS: 5.0000 mg | ORAL_TABLET | ORAL | Status: DC | PRN

## 2021-05-30 MED ORDER — ROCURONIUM BROMIDE 100 MG/10ML IV SOLN
INTRAVENOUS | Status: DC | PRN
  Administered 2021-05-30: 20 mg via INTRAVENOUS

## 2021-05-30 MED ORDER — OXYCODONE HCL 5 MG PO TABS: 5.0000 mg | ORAL_TABLET | Freq: Once | ORAL | Status: DC | PRN

## 2021-05-30 MED ORDER — OXYCODONE HCL 5 MG/5ML PO SOLN: 5.0000 mg | Freq: Once | ORAL | Status: DC | PRN

## 2021-05-30 MED ORDER — DEXAMETHASONE SODIUM PHOSPHATE 10 MG/ML IJ SOLN
INTRAMUSCULAR | Status: DC | PRN
Start: 2021-05-30 — End: 2021-05-30
  Administered 2021-05-30: 4 mg via INTRAVENOUS

## 2021-05-30 MED ORDER — ONDANSETRON HCL 4 MG/2ML IJ SOLN
INTRAMUSCULAR | Status: DC | PRN
  Administered 2021-05-30: 4 mg via INTRAVENOUS

## 2021-05-30 MED ORDER — SODIUM CHLORIDE 0.9 % IV SOLN
INTRAVENOUS | Status: DC | PRN
Start: 2021-05-30 — End: 2021-05-30

## 2021-05-30 MED ORDER — FENTANYL CITRATE (PF) 100 MCG/2ML IJ SOLN
INTRAMUSCULAR | Status: AC
  Administered 2021-05-30: 25 ug via INTRAVENOUS
  Filled 2021-05-30: qty 2

## 2021-05-30 MED ORDER — PROMETHAZINE HCL 25 MG/ML IJ SOLN
INTRAMUSCULAR | Status: AC
  Filled 2021-05-30: qty 1

## 2021-05-30 MED ORDER — PROPOFOL 10 MG/ML IV BOLUS
INTRAVENOUS | Status: AC
  Filled 2021-05-30: qty 20

## 2021-05-30 MED ORDER — DROPERIDOL 2.5 MG/ML IJ SOLN
0.6250 mg | Freq: Once | INTRAMUSCULAR | Status: DC | PRN
  Filled 2021-05-30 (×2): qty 2

## 2021-05-30 MED ORDER — GLYCOPYRROLATE 0.2 MG/ML IJ SOLN
INTRAMUSCULAR | Status: DC | PRN
  Administered 2021-05-30: .4 mg via INTRAVENOUS

## 2021-05-30 MED ORDER — GLYCOPYRROLATE 0.2 MG/ML IJ SOLN
INTRAMUSCULAR | Status: AC
  Filled 2021-05-30: qty 1

## 2021-05-30 MED ORDER — LABETALOL HCL 5 MG/ML IV SOLN
10.0000 mg | INTRAVENOUS | Status: AC | PRN
  Administered 2021-05-30: 10 mg via INTRAVENOUS

## 2021-05-30 MED ORDER — LABETALOL HCL 5 MG/ML IV SOLN
INTRAVENOUS | Status: AC
  Administered 2021-05-30: 10 mg via INTRAVENOUS
  Filled 2021-05-30: qty 4

## 2021-05-30 MED ORDER — NEOSTIGMINE METHYLSULFATE 10 MG/10ML IV SOLN
INTRAVENOUS | Status: AC
  Filled 2021-05-30: qty 1

## 2021-05-30 MED ORDER — SUCCINYLCHOLINE CHLORIDE 200 MG/10ML IV SOSY
PREFILLED_SYRINGE | INTRAVENOUS | Status: DC | PRN
  Administered 2021-05-30: 100 mg via INTRAVENOUS

## 2021-05-30 MED ORDER — NEOSTIGMINE METHYLSULFATE 10 MG/10ML IV SOLN
INTRAVENOUS | Status: DC | PRN
  Administered 2021-05-30: 3 mg via INTRAVENOUS

## 2021-05-30 MED ORDER — BUPIVACAINE-EPINEPHRINE (PF) 0.25% -1:200000 IJ SOLN
INTRAMUSCULAR | Status: AC
  Filled 2021-05-30: qty 30

## 2021-05-30 MED ORDER — HEPARIN SODIUM (PORCINE) 10000 UNIT/ML IJ SOLN
INTRAMUSCULAR | Status: DC | PRN
  Administered 2021-05-30 (×2): 10000 [IU]

## 2021-05-30 MED ORDER — DEXAMETHASONE SODIUM PHOSPHATE 10 MG/ML IJ SOLN
INTRAMUSCULAR | Status: AC
  Filled 2021-05-30: qty 1

## 2021-05-30 SURGICAL SUPPLY — 53 items
ADAPTER CATH DIALYSIS 4X8 IT L (MISCELLANEOUS) ×2 IMPLANT
ADH SKN CLS APL DERMABOND .7 (GAUZE/BANDAGES/DRESSINGS) ×1
APL PRP STRL LF DISP 70% ISPRP (MISCELLANEOUS) ×1
BIOPATCH WHT 1IN DISK W/4.0 H (GAUZE/BANDAGES/DRESSINGS) ×2 IMPLANT
BLADE CLIPPER SURG (BLADE) IMPLANT
CATH EXTENDED DIALYSIS (CATHETERS) ×2 IMPLANT
CHLORAPREP W/TINT 26 (MISCELLANEOUS) ×2 IMPLANT
DEFOGGER ANTIFOG KIT (MISCELLANEOUS) ×2 IMPLANT
DEFOGGER SCOPE WARMER CLEARIFY (MISCELLANEOUS) ×2 IMPLANT
DERMABOND ADVANCED (GAUZE/BANDAGES/DRESSINGS) ×1
DERMABOND ADVANCED .7 DNX12 (GAUZE/BANDAGES/DRESSINGS) ×1 IMPLANT
ELECT CAUTERY BLADE 6.4 (BLADE) ×2 IMPLANT
ELECT REM PT RETURN 9FT ADLT (ELECTROSURGICAL) ×2
ELECTRODE REM PT RTRN 9FT ADLT (ELECTROSURGICAL) ×1 IMPLANT
GAUZE 4X4 16PLY ~~LOC~~+RFID DBL (SPONGE) ×2 IMPLANT
GLOVE SURG ENC MOIS LTX SZ7 (GLOVE) ×2 IMPLANT
GOWN STRL REUS W/ TWL LRG LVL3 (GOWN DISPOSABLE) ×2 IMPLANT
GOWN STRL REUS W/TWL LRG LVL3 (GOWN DISPOSABLE) ×4
GRASPER SUT TROCAR 14GX15 (MISCELLANEOUS) ×2 IMPLANT
IV NS 1000ML (IV SOLUTION) ×2
IV NS 1000ML BAXH (IV SOLUTION) ×1 IMPLANT
KIT TURNOVER KIT A (KITS) ×2 IMPLANT
MANIFOLD NEPTUNE II (INSTRUMENTS) ×2 IMPLANT
MINICAP W/POVIDONE IODINE SOL (MISCELLANEOUS) ×2 IMPLANT
NDL SAFETY ECLIPSE 18X1.5 (NEEDLE) ×1 IMPLANT
NEEDLE HYPO 18GX1.5 SHARP (NEEDLE) ×2
NEEDLE HYPO 22GX1.5 SAFETY (NEEDLE) ×2 IMPLANT
NEEDLE INSUFFLATION 14GA 120MM (NEEDLE) ×2 IMPLANT
PACK LAP CHOLECYSTECTOMY (MISCELLANEOUS) ×2 IMPLANT
PENCIL ELECTRO HAND CTR (MISCELLANEOUS) ×2 IMPLANT
SET CYSTO W/LG BORE CLAMP LF (SET/KITS/TRAYS/PACK) ×2 IMPLANT
SET TRANSFER 6 W/TWIST CLAMP 5 (SET/KITS/TRAYS/PACK) ×2 IMPLANT
SET TUBE SMOKE EVAC HIGH FLOW (TUBING) ×2 IMPLANT
SLEEVE ADV FIXATION 5X100MM (TROCAR) ×2 IMPLANT
SPONGE DRAIN TRACH 4X4 STRL 2S (GAUZE/BANDAGES/DRESSINGS) ×2 IMPLANT
SPONGE T-LAP 18X18 ~~LOC~~+RFID (SPONGE) ×2 IMPLANT
STYLET FALLER (MISCELLANEOUS) ×2 IMPLANT
SUT ETHILON 3-0 FS-10 30 BLK (SUTURE) ×2
SUT MNCRL 4-0 (SUTURE) ×2
SUT MNCRL 4-0 27XMFL (SUTURE) ×1
SUT VIC AB 3-0 SH 27 (SUTURE) ×2
SUT VIC AB 3-0 SH 27X BRD (SUTURE) ×1 IMPLANT
SUT VICRYL 0 AB UR-6 (SUTURE) ×4 IMPLANT
SUTURE EHLN 3-0 FS-10 30 BLK (SUTURE) ×1 IMPLANT
SUTURE MNCRL 4-0 27XMF (SUTURE) ×1 IMPLANT
SYR 20ML LL LF (SYRINGE) ×2 IMPLANT
SYR 3ML LL SCALE MARK (SYRINGE) ×2 IMPLANT
SYR TOOMEY IRRIG 70ML (MISCELLANEOUS) ×2
SYRINGE TOOMEY IRRIG 70ML (MISCELLANEOUS) ×1 IMPLANT
SYS KII FIOS ACCESS ABD 5X100 (TROCAR) ×2
SYSTEM KII FIOS ACES ABD 5X100 (TROCAR) ×1 IMPLANT
TROCAR XCEL NON-BLD 5MMX100MML (ENDOMECHANICALS) ×2 IMPLANT
WATER STERILE IRR 500ML POUR (IV SOLUTION) IMPLANT

## 2021-05-30 NOTE — Progress Notes (Signed)
Patient awake/alert x4.  All medications administered in pacu effective. Patient's bp has been elevated sbp170-190's. Reviewed procedure with accepting RN. Discussed plan of care, made aware of labs.  Patient verbalizes understanding of procedure done today and plan of care for home PD.  Afebrile  sbp 180-190's upon transfer to room.

## 2021-05-30 NOTE — OR Nursing (Signed)
Patient with one episode of scant amt. Of emesis upon arrival to [place]. B/P is 207/84. Dr. Rosey Bath notified. Istat poct potassium performed with result of 3.0. Dr. Rosey Bath notified.

## 2021-05-30 NOTE — Progress Notes (Addendum)
Inpatient Diabetes Program Recommendations  AACE/ADA: New Consensus Statement on Inpatient Glycemic Control   Target Ranges:  Prepandial:   less than 140 mg/dL      Peak postprandial:   less than 180 mg/dL (1-2 hours)      Critically ill patients:  140 - 180 mg/dL   Results for XARIAH, SILVERNAIL (MRN [id number]) as of 05/30/2021 09:08  Ref. Range 05/29/2021 07:52 05/29/2021 13:32 05/29/2021 16:37 05/29/2021 21:50 05/30/2021 07:35  Glucose-Capillary Latest Ref Range: 70 - 99 mg/dL 295 (H) 155 (H) 194 (H) 184 (H) 273 (H)    Review of Glycemic Control  Diabetes history: DM (low c-peptide; treated as DM1) Outpatient Diabetes medications: Lantus 16 units daily, Humalog 0-8 units TID with meals Current orders for Inpatient glycemic control: Semglee 16 units daily, Novolog 0-9 units TID with meals, Novolog 0-5 units QHS, Novolog 2 units TID with meals   Inpatient Diabetes Program Recommendations:     Insulin: Please consider increasing Semglee to 18 units daily.  Thanks, Barnie Alderman, RN, MSN, CDE Diabetes Coordinator Inpatient Diabetes Program [phone] (Team Pager from 8am to 5pm)

## 2021-05-30 NOTE — Progress Notes (Addendum)
[facility]  ROUNDING NOTE   Subjective:   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 However she presents today with continued nausea and vomiting. Seems patient has been undergoing GI work up with [facility]. EGD on 9/28.   Patient seen sitting up in bed Currently NPO for procedure States she feels better and swelling is improving  Objective:  Vital signs in last 24 hours:  Temp:  [98.1 F (36.7 C)-98.7 F (37.1 C)] 98.2 F (36.8 C) (10/06 1113) Pulse Rate:  [75-89] 85 (10/06 1113) Resp:  [14-23] 17 (10/06 1113) BP: (151-199)/(68-88) 174/88 (10/06 1113) SpO2:  [93 %-99 %] 98 % (10/06 1113) Weight:  [78.1 kg] 78.1 kg (10/05 1247)  Weight change: -0.1 kg Filed Weights   05/28/21 1234 05/29/21 0900 05/29/21 1247  Weight: 79.3 kg 79.2 kg 78.1 kg    Intake/Output: I/O last 3 completed shifts: In: 240 [P.O.:240] Out: 1000 [Other:1000]   Intake/Output this shift:  No intake/output data recorded.  Physical Exam: General: NAD, sitting up in bed  Head: Normocephalic, atraumatic. Moist oral mucosal membranes, facial edema  Eyes: Anicteric  Lungs:  Clear to auscultation, normal breathing  Heart: Regular rate and rhythm  Abdomen:  Soft, nontender  Extremities:  + peripheral edema.  Neurologic: Nonfocal, moving all four extremities  Skin: No lesions  Access: Rt IJ Permcath    Basic Metabolic Panel: Recent Labs  Lab 05/26/21 1029 05/27/21 0435 05/27/21 1222 05/30/21 0245  NA 139 135  --  135  K 3.6 3.3*  --  3.2*  CL 96*  98  --  100  CO2 29 29  --  30  GLUCOSE 260* 236*  --  206*  BUN 36* 32*  --  18  CREATININE 4.93* 4.51*  --  2.67*  CALCIUM 9.2 8.4*  --  7.6*  MG  --   --  2.2  --   PHOS  --   --  3.1  --      Liver Function Tests: Recent Labs  Lab 05/26/21 1029  AST 19  ALT 11  ALKPHOS 55  BILITOT 0.9  PROT 6.0*  ALBUMIN 3.4*    Recent Labs  Lab 05/26/21 1029  LIPASE 22    No results for input(s): AMMONIA in the last 168 hours.  CBC: Recent Labs  Lab 05/26/21 1029  WBC 9.8  HGB 9.8*  HCT 28.4*  MCV 86.1  PLT 304     Cardiac Enzymes: No results for input(s): CKTOTAL, CKMB, CKMBINDEX, TROPONINI in the last 168 hours.  BNP: Invalid input(s): POCBNP  CBG: Recent Labs  Lab 05/29/21 1332 05/29/21 1637 05/29/21 2150 05/30/21 0735 05/30/21 1115  GLUCAP 155* 194* 184* 273* 189*     Microbiology: Results for orders placed or performed during the hospital encounter of 05/26/21  Resp Panel by RT-PCR (Flu A&B, Covid) Nasopharyngeal Swab     Status: None   Collection Time: 05/26/21 12:59 PM   Specimen: Nasopharyngeal Swab; Nasopharyngeal(NP) swabs in vial transport medium  Result Value Ref Range Status   SARS Coronavirus 2 by RT PCR NEGATIVE NEGATIVE Final    Comment: (NOTE) SARS-CoV-2 target  nucleic acids are NOT DETECTED.  The SARS-CoV-2 RNA is generally detectable in upper respiratory specimens during the acute phase of infection. The lowest concentration of SARS-CoV-2 viral copies this assay can detect is 138 copies/mL. A negative result does not preclude SARS-Cov-2 infection and should not be used as the sole basis for treatment or other patient management decisions. A negative result may occur with  improper specimen collection/handling, submission of specimen other than nasopharyngeal swab, presence of viral mutation(s) within the areas targeted by this assay, and inadequate number of viral copies(<138 copies/mL). A negative result must be combined  with clinical observations, patient history, and epidemiological information. The expected result is Negative.  Fact Sheet for Patients:  EntrepreneurPulse.com.au  Fact Sheet for Healthcare Providers:  IncredibleEmployment.be  This test is no t yet approved or cleared by the [place] FDA and  has been authorized for detection and/or diagnosis of SARS-CoV-2 by FDA under an Emergency Use Authorization (EUA). This EUA will remain  in effect (meaning this test can be used) for the duration of the COVID-19 declaration under Section 564(b)(1) of the Act, 21 U.S.C.section 360bbb-3(b)(1), unless the authorization is terminated  or revoked sooner.       Influenza A by PCR NEGATIVE NEGATIVE Final   Influenza B by PCR NEGATIVE NEGATIVE Final    Comment: (NOTE) The Xpert Xpress SARS-CoV-2/FLU/RSV plus assay is intended as an aid in the diagnosis of influenza from Nasopharyngeal swab specimens and should not be used as a sole basis for treatment. Nasal washings and aspirates are unacceptable for Xpert Xpress SARS-CoV-2/FLU/RSV testing.  Fact Sheet for Patients: EntrepreneurPulse.com.au  Fact Sheet for Healthcare Providers: IncredibleEmployment.be  This test is not yet approved or cleared by the [place] FDA and has been authorized for detection and/or diagnosis of SARS-CoV-2 by FDA under an Emergency Use Authorization (EUA). This EUA will remain in effect (meaning this test can be used) for the duration of the COVID-19 declaration under Section 564(b)(1) of the Act, 21 U.S.C. section 360bbb-3(b)(1), unless the authorization is terminated or revoked.  Performed at [facility], [address] [place],  [address]     Coagulation Studies: No results for input(s): LABPROT, INR in the last 72 hours.  Urinalysis: No results for input(s): COLORURINE, LABSPEC, PHURINE, GLUCOSEU, HGBUR,  BILIRUBINUR, KETONESUR, PROTEINUR, UROBILINOGEN, NITRITE, LEUKOCYTESUR in the last 72 hours.  Invalid input(s): APPERANCEUR     Imaging: No results found.   Medications:     ceFAZolin (ANCEF) IV       amLODipine  5 mg Oral Daily   carvedilol  12.5 mg Oral BID   Chlorhexidine Gluconate Cloth  6 each Topical Q0600   escitalopram  5 mg Oral Daily   heparin injection (subcutaneous)  5,000 Units Subcutaneous Q8H   insulin aspart  0-5 Units Subcutaneous QHS   insulin aspart  0-9 Units Subcutaneous TID WC   insulin aspart  2 Units Subcutaneous TID WC   insulin glargine-yfgn  16 Units Subcutaneous Daily   metoCLOPramide (REGLAN) injection  10 mg Intravenous TID AC   mupirocin ointment  1 application Topical BID   pantoprazole (PROTONIX) IV  40 mg Intravenous Q12H   potassium chloride  20 mEq Oral Once   senna  1 tablet Oral Daily   acetaminophen **OR** acetaminophen, labetalol, nitroGLYCERIN, traZODone  Assessment/ Plan:  [name] is a 60 y.o. 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 Unclear if her current symptoms are due to uremia. Dialysis initiated 05/27/21. Received third treatment yesterday, in chair. Tolerated well. UF 1L achieved.  Patient desires peritoneal dialysis. PD placement surgery planned for today. Patient will be discharged on hemodialysis and transition to peritoneal  dialysis outpatient. Dialysis coordinator aware of patient and assisting with outpatient needs. Next dialysis treatment scheduled for Friday  Hypertension with chronic kidney disease: 174/88. Initially believed driven by nausea and vomiting. No recent complaints of nausea and vomiting. Continue amlodipine and carvedilol. Holding losartan.Has received two PRN doses of IV Labetalol this am.    Diabetes mellitus type II with chronic kidney disease: insulin dependent hemoglobin A1c of 7.5%. Not well controlled. Complicated by diabetic gastroparesis. Remains elevated   Anemia of chronic kidney disease: hemoglobin 9.8. normocytic.   Secondary Hyperparathyroidism: PTH-89 Phos 3.1  Hypokalemia at 3.2. oral supplementation ordered   LOS: 3 Paitynn Mikus 10/6/202212:26 PM

## 2021-05-30 NOTE — Anesthesia Preprocedure Evaluation (Addendum)
Anesthesia Evaluation  Patient identified by MRN, date of birth, ID band Patient awake    Reviewed: Allergy & Precautions, NPO status , Patient's Chart, lab work & pertinent test results  History of Anesthesia Complications Negative for: history of anesthetic complications  Airway Mallampati: III  TM Distance: >3 FB Neck ROM: full    Dental  (+) Chipped, Dental Advidsory Given, Caps   Pulmonary neg pulmonary ROS, former smoker,    Pulmonary exam normal        Cardiovascular hypertension, Pt. on medications and Pt. on home beta blockers (-) angina(-) Past MI and (-) Cardiac Stents Normal cardiovascular exam(-) dysrhythmias (-) Valvular Problems/Murmurs     Neuro/Psych  Headaches, neg Seizures PSYCHIATRIC DISORDERS Depression    GI/Hepatic Neg liver ROS, GERD  ,Intractable vomiting with abdominal pain Gastroparesis   Endo/Other  diabetes (with peripheral nueropathycontrolled at 7.5), Well Controlled, Type 1, Insulin Dependent  Renal/GU Dialysis and CRFRenal disease (CKD stage 5, The patient underwent HD on 05/27/2021 for the first time.)  negative genitourinary   Musculoskeletal   Abdominal   Peds  Hematology negative hematology ROS (+)   Anesthesia Other Findings Pt presented to the ED for epigastric and chest pain associated with nausea and vomiting  Past Medical History: No date: Chronic kidney disease No date: Diabetes mellitus without complication ([place]) No date: Migraine  Past Surgical History: No date: ABDOMINAL HYSTERECTOMY No date: COLONOSCOPY W/ POLYPECTOMY 05/13/2021: DIALYSIS/PERMA CATHETER INSERTION; N/A     Comment:  Procedure: DIALYSIS/PERMA CATHETER INSERTION;  Surgeon:               Algernon Huxley, MD;  Location: [facility];                Service: Cardiovascular;  Laterality: N/A; 03/02/2017: ESOPHAGOGASTRODUODENOSCOPY     Comment:  Normal No date: HEMORRHOID SURGERY  BMI    Body Mass  Index: 26.97 kg/m    Patient vomited on her way down, but states this is normal for her.  Reproductive/Obstetrics negative OB ROS                           Anesthesia Physical Anesthesia Plan  ASA: 3  Anesthesia Plan: General   Post-op Pain Management:    Induction: Intravenous, Rapid sequence and Cricoid pressure planned  PONV Risk Score and Plan: Ondansetron, Dexamethasone and Treatment may vary due to age or medical condition  Airway Management Planned: Oral ETT  Additional Equipment:   Intra-op Plan:   Post-operative Plan: Extubation in OR  Informed Consent:   Plan Discussed with: Anesthesiologist, CRNA and Surgeon  Anesthesia Plan Comments: (Refusal of blood transfusions as patient is Jehovah's Witness)       Anesthesia Quick Evaluation

## 2021-05-30 NOTE — Transfer of Care (Signed)
Immediate Anesthesia Transfer of Care Note  Patient: Kelli [name]  Procedure(s) Performed: CONTINUOUS AMBULATORY PERITONEAL DIALYSIS  (CAPD) CATHETER INSERTION  Patient Location: PACU  Anesthesia Type:General  Level of Consciousness: awake, alert  and oriented  Airway & Oxygen Therapy: Patient Spontanous Breathing and Patient connected to face mask oxygen  Post-op Assessment: Report given to RN and Post -op Vital signs reviewed and stable  Post vital signs: Reviewed and stable  Last Vitals:  Vitals Value Taken Time  BP 205/87 05/30/21 1601  Temp    Pulse 77 05/30/21 1602  Resp 31 05/30/21 1602  SpO2 100 % 05/30/21 1602  Vitals shown include unvalidated device data.  Last Pain:  Vitals:   05/30/21 1411  TempSrc: Tympanic  PainSc: 0-No pain      Patients Stated Pain Goal: 0 (32/76/14 7092)  Complications: No notable events documented.

## 2021-05-30 NOTE — Anesthesia Procedure Notes (Signed)
Procedure Name: Intubation Date/Time: 05/30/2021 2:47 PM Performed by: Johnna Acosta, CRNA Pre-anesthesia Checklist: Patient identified, Emergency Drugs available, Suction available, Patient being monitored and Timeout performed Patient Re-evaluated:Patient Re-evaluated prior to induction Oxygen Delivery Method: Circle system utilized Preoxygenation: Pre-oxygenation with 100% oxygen Induction Type: IV induction, Rapid sequence and Cricoid Pressure applied Laryngoscope Size: McGraph and 3 Grade View: Grade I Tube type: Oral Number of attempts: 1 Airway Equipment and Method: Stylet, Video-laryngoscopy and Oral airway Placement Confirmation: ETT inserted through vocal cords under direct vision, positive ETCO2 and breath sounds checked- equal and bilateral Secured at: 21 cm Tube secured with: Tape Dental Injury: Teeth and Oropharynx as per pre-operative assessment

## 2021-05-30 NOTE — Op Note (Signed)
Laparoscopic placement of peritoneal Dialysis catheter ( Total three cuffs) Laparoscopic Omentopexy   Pre-operative Diagnosis: ESRD   Post-operative Diagnosis: same     Surgeon: Caroleen Hamman, MD FACS   Anesthesia: Gen. with endotracheal tube      Findings: Catheter within pelvis Good return after infusing Peritoneal cavity   Estimated Blood Loss: 5cc              Complications: none     Procedure Details  The patient was seen again in the Holding Room. The benefits, complications, treatment options, and expected outcomes were discussed with the patient. The risks of bleeding, infection, recurrence of symptoms, failure to resolve symptoms, catheter malfunction bowel injury, any of which could require further surgery were reviewed with the patient. The likelihood of improving the patient's symptoms with return to their baseline status is good.  The patient and/or family concurred with the proposed plan, giving informed consent.  The patient was taken to Operating Room, identified and the procedure verified. A Time Out was held and the above information confirmed.   Prior to the induction of general anesthesia, antibiotic prophylaxis was administered. VTE prophylaxis was in place. General endotracheal anesthesia was then administered and tolerated well. After the induction, the abdomen was prepped with Chloraprep and draped in the sterile fashion. The patient was positioned in the supine position.   Periumbilical incision created to the left of the midline.   The anterior rectus fascia identified and incised, rectus muscle identified and retracted laterally, using a port We were able to tunnel into the retro rectus space for about 6 cms. Peritoneum was pierced off the midline. Pneumoperitoneum was obtained w/o hemodynamic changes. We placed two additional laparoscopic ports under direct visualization.   We were able to thread the catheter via the laparoscopic port within the retrorectus  space in the standard fashion.  Under direct visualization we made sure that the coil portion of the catheter layed within the pelvis without any kinks.   I was able to also performed a counterincision in the left subcostal area and Tailored an additional extension of the catheter . The  Extension had 2 cuffs and I was able to connect it to parse together with the titanium connector in the standard fashion.  There was no evidence of any kinks.  The exit site was to the left upper quadrant. We instilled heparinized saline a liter into the pelvis and we had very good return. He did have generous omentum, attention was then turned to the omentum and using a PMI device we were able to perform an omentopexy and tacked the omentum to the abdominal wall using 2 interrupted 2-0 Vicryl sutures in the standard fashion. All skin incisions  were infiltrated with a liposomal Marcaine. 4-0 subcuticular Monocryl was used to close the skin. Dermabond was  applied. Sterile dressing applied to the catheter. The patient was then extubated and brought to the recovery room in stable condition. Sponge, lap, and needle counts were correct at closure and at the conclusion of the case.               Caroleen Hamman, MD, FACS

## 2021-05-30 NOTE — TOC CM/SW Note (Signed)
CSW acknowledges consult, "Pt is converting to PD. Catheter to be placed today. Will need PD set up for home and education for patient." Dialysis coordinator is already following this patient.  Kelli [name], [place]

## 2021-05-30 NOTE — Progress Notes (Addendum)
PROGRESS NOTE  SAMIA KUKLA QQV:[id number] DOB: May 28, 1961 DOA: 05/26/2021 PCP: Glean Hess, MD  Brief History   Kelli [name] is a 60 year old female with diabetes mellitus, peripheral neuropathy, chronic kidney disease, hyperlipidemia, depression who presents to the ED for epigastric and chest pain associated with nausea and vomiting despite using Reglan at home.  She also describes upper abdominal pain. She further admits to constipation and a dry cough.  In the ED creatinine noted to be 4.93 which is not far from her baseline.  She was given 1 L of IV fluids IV Protonix and IV Reglan and admitted to the hospital.   Of note BP was quite elevated with readings of 190/76, 185/103, 178/75.  The patient was admitted to a telemetry bed. Nephrology was consulted. She was given IV reglan. Nausea and vomiting has resolved. The patient underwent HD on 05/27/2021 for the first time. The patient has decided to go forward with PD. General surgery will place PD catheter tomorrow.   The patient is being followed by the dialysis coordinator who will make arrangements for PD at home.  Consultants  Nephrology General Surgery  Procedures  Placement of PD catheter.  Antibiotics   Anti-infectives (From admission, onward)    Start     Dose/Rate Route Frequency Ordered Stop   05/30/21 1409  ceFAZolin (ANCEF) 1-4 GM/50ML-% IVPB       Note to Pharmacy: Norton Blizzard  : cabinet override      05/30/21 1409 05/30/21 1456   05/30/21 1100  ceFAZolin (ANCEF) powder 1 g  Status:  Discontinued        1 g Other To Surgery 05/29/21 1537 05/29/21 1548   05/30/21 0600  [MAR Hold]  ceFAZolin (ANCEF) IVPB 1 g/50 mL premix        (MAR Hold since Thu 05/30/2021 at 1353.Hold Reason: Transfer to a Procedural area)   1 g 100 mL/hr over 30 Minutes Intravenous To Surgery 05/29/21 1549 05/30/21 1508        Subjective  The patient is resting comfortably. No new complaints.   Objective   Vitals:   Vitals:   05/30/21 1630 05/30/21 1645  BP: (!) 192/78 (!) 204/80  Pulse: 76 77  Resp: 18 16  Temp: 98.4 F (36.9 C)   SpO2: 100% 95%    Exam:  Constitutional:  The patient is awake, alert, and oriented x 3. No acute distress. Respiratory:  CTA bilaterally, no w/r/r.  Respiratory effort normal. No retractions or accessory muscle use Cardiovascular:  RRR, no m/r/g No LE extremity edema   Normal pedal pulses Abdomen:  Abdomen appears normal; no tenderness or masses No hernias No HSM Musculoskeletal:  Digits/nails BUE: no clubbing, cyanosis, petechiae, infection exam of joints, bones, muscles of at least one of following: head/neck, RUE, LUE, RLE, LLE   Skin:  No rashes, lesions, ulcers palpation of skin: no induration or nodules Neurologic:  CN 2-12 intact Sensation all 4 extremities intact Moving all extremities Psychiatric:  Mental status: Baseline Mood, affect congruent Orientation to person, place, time  judgment and insight appear intact    I have personally reviewed the following:   Today's Data  Vitals  Lab Data  BMP  Imaging  CT abdomen and pelvis CXR  Cardiology Data  EKG  Scheduled Meds:  [MAR Hold] amLODipine  5 mg Oral Daily   [MAR Hold] carvedilol  12.5 mg Oral BID   [MAR Hold] Chlorhexidine Gluconate Cloth  6 each Topical Q0600   [facility]  Hold] escitalopram  5 mg Oral Daily   [MAR Hold] heparin injection (subcutaneous)  5,000 Units Subcutaneous Q8H   [MAR Hold] insulin aspart  0-5 Units Subcutaneous 62   [MAR Hold] insulin aspart  0-9 Units Subcutaneous TID WC   [MAR Hold] insulin aspart  2 Units Subcutaneous TID WC   [MAR Hold] insulin glargine-yfgn  16 Units Subcutaneous Daily   [MAR Hold] metoCLOPramide (REGLAN) injection  10 mg Intravenous TID AC   [MAR Hold] mupirocin ointment  1 application Topical BID   [MAR Hold] pantoprazole (PROTONIX) IV  40 mg Intravenous Q12H   [MAR Hold] potassium chloride  20 mEq Oral Once   promethazine        promethazine  12.5 mg Intravenous Once   [MAR Hold] senna  1 tablet Oral Daily   Continuous Infusions:  acetaminophen      Principal Problem:   Intractable vomiting Active Problems:   Essential hypertension   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HCC)   Moderate episode of recurrent major depressive disorder (HCC)   Intractable vomiting with nausea   CKD (chronic kidney disease) stage 5, GFR 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 I have increased the dose of reglan.   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 - A1c noted to be to be reasonably controlled at 7.5 Currently receiving Semglee and TID Novolog with meals- sugars elevated this AM- will increase insulin   Hypokalemia: K of 3.2. Supplemented with 20 meq KCL PO.   Essential hypertension -Receives losartan, amlodipine and carvedilol at home - BP quite elevated in the hospital, possibly due to acute distress in the setting of severe chronic kidney disease and fluid overload -BP improved with HD.     CKD (chronic kidney disease) stage 5, GFR less than 15 ml/min (HCC) -Follows with nephrology as outpatient and has been getting prepared for dialysis -Nephrology has decided that the patient will undergo her first dialysis treatment today  - The patient has decided to convert to PD. She will go for placement of PD catheter tomorrow. Will need education and to be set up for PD as outpatient. Dialysis coordinator is following.   Depression - Continue Ecitalopram  I have seen and  examined this patient myself. I have spent 32 minutes with her evaluation and care.   Time spent in minutes: 62 DVT prophylaxis: heparin injection 5,000 Units Start: 05/26/21 2200  Code Status: Full code Family Communication:  Level of Care: Level of care: Med-Surg Disposition Plan:  Status is: Observation   The patient will require care spanning > 2 midnights and should be moved to inpatient because: IV treatments appropriate due to intensity of illness or inability to take PO   Dispo: The patient is from: Home              Anticipated d/c is to: Home              Patient currently is not medically stable to d/c.  Difficult to place patient No   Isys Tietje, DO [facility] Direct contact: see [email or web address]  7PM-7AM contact night coverage as above 05/30/2021, 5:09 PM  LOS: 2 days   ADDENDUM: Will hold off on discharge today due to patient's ongoing nausea.

## 2021-05-31 ENCOUNTER — Encounter: Payer: Self-pay | Admitting: Surgery

## 2021-05-31 LAB — CBC WITH DIFFERENTIAL/PLATELET
Abs Immature Granulocytes: 0.06 10*3/uL (ref 0.00–0.07)
Basophils Absolute: 0 10*3/uL (ref 0.0–0.1)
Basophils Relative: 0 %
Eosinophils Absolute: 0 10*3/uL (ref 0.0–0.5)
Eosinophils Relative: 0 %
HCT: 22.5 % — ABNORMAL LOW (ref 36.0–46.0)
Hemoglobin: 7.6 g/dL — ABNORMAL LOW (ref 12.0–15.0)
Immature Granulocytes: 1 %
Lymphocytes Relative: 11 %
Lymphs Abs: 1.2 10*3/uL (ref 0.7–4.0)
MCH: 29.6 pg (ref 26.0–34.0)
MCHC: 33.8 g/dL (ref 30.0–36.0)
MCV: 87.5 fL (ref 80.0–100.0)
Monocytes Absolute: 0.9 10*3/uL (ref 0.1–1.0)
Monocytes Relative: 9 %
Neutro Abs: 8.5 10*3/uL — ABNORMAL HIGH (ref 1.7–7.7)
Neutrophils Relative %: 79 %
Platelets: 213 10*3/uL (ref 150–400)
RBC: 2.57 MIL/uL — ABNORMAL LOW (ref 3.87–5.11)
RDW: 13.2 % (ref 11.5–15.5)
WBC: 10.7 10*3/uL — ABNORMAL HIGH (ref 4.0–10.5)
nRBC: 0 % (ref 0.0–0.2)

## 2021-05-31 LAB — BASIC METABOLIC PANEL
Anion gap: 7 (ref 5–15)
BUN: 23 mg/dL (ref 8–23)
CO2: 27 mmol/L (ref 22–32)
Calcium: 7.9 mg/dL — ABNORMAL LOW (ref 8.9–10.3)
Chloride: 99 mmol/L (ref 98–111)
Creatinine, Ser: 3.63 mg/dL — ABNORMAL HIGH (ref 0.44–1.00)
GFR, Estimated: 14 mL/min — ABNORMAL LOW (ref >60)
Glucose, Bld: 211 mg/dL — ABNORMAL HIGH (ref 70–99)
Potassium: 3.6 mmol/L (ref 3.5–5.1)
Sodium: 133 mmol/L — ABNORMAL LOW (ref 135–145)

## 2021-05-31 LAB — GLUCOSE, CAPILLARY
Glucose-Capillary: 180 mg/dL — ABNORMAL HIGH (ref 70–99)
Glucose-Capillary: 204 mg/dL — ABNORMAL HIGH (ref 70–99)
Glucose-Capillary: 219 mg/dL — ABNORMAL HIGH (ref 70–99)

## 2021-05-31 MED ORDER — HEPARIN SODIUM (PORCINE) 1000 UNIT/ML IJ SOLN
INTRAMUSCULAR | Status: AC
  Filled 2021-05-31: qty 1

## 2021-05-31 MED ORDER — LOSARTAN POTASSIUM 50 MG PO TABS
100.0000 mg | ORAL_TABLET | Freq: Every day | ORAL | Status: DC
  Administered 2021-05-31: 100 mg via ORAL
  Filled 2021-05-31: qty 2

## 2021-05-31 NOTE — Discharge Summary (Signed)
Physician Discharge Summary  Kelli [name] MVE:[id number] DOB: 05-21-1961 DOA: 05/26/2021  PCP: Kelli Hess, MD  Admit date: 05/26/2021 Discharge date: 05/31/2021  Recommendations for Outpatient Follow-up:  Discharge to home. Resume hemodialysis schedule as prior to admission Transition to PD as per instructions of dialysis navigator. Follow up with PCP in 7-10 days. Follow up with nephrology as directed. Follow up with general surgery as directed.   Follow-up Information     Pabon, [place] F, MD Follow up in 4 week(s).   Specialty: General Surgery Contact information: [address] [place] [address] [phone]                  Discharge Diagnoses: Principal diagnosis is #1 Diabetic gastroparesis Nausea/vomiting Abdominal pain.   Discharge Condition: Fair Disposition: Home  Diet recommendation: Carbohydrate modified renal diet.  Filed Weights   05/29/21 1247 05/30/21 1411 05/31/21 0845  Weight: 78.1 kg 78.1 kg 78.8 kg    History of present illness:   Kelli [name] is a 60 y.o. female with medical history significant of DM, HDL, CKD, peripheral neuropathy, depression, and recent endoscopy on 9/28 with findings concerning for reflux esophagitis who presents for assessment of chest pain, upper abdominal pain and some intractable nausea and vomiting stating she cannot keep down the Reglan she was prescribed for this.  Patient states she had some issues with this going back several months, 12 admissions in the last year,  before the endoscopy but feels it is particular worse over the last week and has not really keep any fluids down whatsoever.  States she feels a little constipated and still make some urine but has not had any burning with urination or diarrhea.  She endorses nonproductive cough that is also going on for several weeks and is not sure if it is different today.  No new fevers, earache, sore throat, rash or extremity pain.  No  recent falls or injuries.  States he also has not been able to keep her blood pressure medicines down.  No other acute concerns at this time.  She has not yet been started on dialysis but recently had a tunnel cath placed with anticipation she will need dialysis.     ED Course: T 98  176/75  HR 87  RR 16. CT abd/pelvis - no acute process. CXR- NAD. Lab: glucose 260, Cr 4.93 (4.13 baseline) and preparing for HD. WBC 9.8, Hgb 9.8. Patient given 1 L  IVF, given IV protonix, IV reglan. TRH called to admit for continued evaluation and tx.    Hospital Course:  Kelli [name] is a 60 year old female with diabetes mellitus, peripheral neuropathy, chronic kidney disease, hyperlipidemia, depression who presents to the ED for epigastric and chest pain associated with nausea and vomiting despite using Reglan at home.  She also describes upper abdominal pain. She further admits to constipation and a dry cough.  In the ED creatinine noted to be 4.93 which is not far from her baseline.  She was given 1 L of IV fluids IV Protonix and IV Reglan and admitted to the hospital.   Of note BP was quite elevated with readings of 190/76, 185/103, 178/75.   The patient was admitted to a telemetry bed. Nephrology was consulted. She was given IV reglan. Nausea and vomiting has resolved. The patient underwent HD on 05/27/2021 for the first time. The patient has decided to go forward with PD. General surgery placed PD catheter on 05/30/2021.   The patient  had a recurrence of nausea and vomiting on the morning of 05/29/2021. Her dose of reglan was increased to 10 mg TID.   The patient is being followed by the dialysis coordinator who will make arrangements for PD at home. She will resume her usual HD schedule on Monday 06/03/2021.  Today's assessment: S: The patient is awake, alert, and oriented x 3.  O: Vitals:  Vitals:   05/31/21 1215 05/31/21 1511  BP: (!) 173/71 133/78  Pulse:  78  Resp: (!) 25 18  Temp:  98.6 F (37  C)  SpO2:  98%    Constitutional:  Appears calm and comfortable. No acute distress. Respiratory:  CTA bilaterally, no w/r/r.  Respiratory effort normal. No retractions or accessory muscle use Cardiovascular:  RRR, no m/r/g No LE extremity edema   Normal pedal pulses Abdomen:  Abdomen appears normal; no tenderness or masses No hernias No HSM Musculoskeletal:  Digits/nails: no clubbing, cyanosis, petechiae, infection exam of joints, bones, muscles of at least one of following: head/neck, RUE, LUE, RLE, LLE   strength and tone normal, no atrophy, no abnormal movements No tenderness, masses Normal ROM, no contractures  gait and station Skin:  No rashes, lesions, ulcers palpation of skin: no induration or nodules Neurologic:  CN 2-12 intact Sensation all 4 extremities intact Psychiatric:  judgement and insight appear normal Mental status Mood, affect appropriate Orientation to person, place, time   Discharge Instructions  Discharge Instructions     Activity as tolerated - No restrictions   Complete by: As directed    Call MD for:  difficulty breathing, headache or visual disturbances   Complete by: As directed    Call MD for:  persistant nausea and vomiting   Complete by: As directed    Call MD for:  temperature >100.4   Complete by: As directed    Diet - low sodium heart healthy   Complete by: As directed    Diet Carb Modified   Complete by: As directed    Renal/carb modified.   Discharge instructions   Complete by: As directed    Discharge to home. Resume hemodialysis schedule as prior to admission Transition to PD as per instructions of dialysis navigator. Follow up with PCP in 7-10 days. Follow up with nephrology as directed.   Increase activity slowly   Complete by: As directed    No wound care   Complete by: As directed       Allergies as of 05/31/2021       Reactions   Statins Nausea And Vomiting   Albumin Human Other (See Comments)   Refuses  all blood products as one of [employer]es Refuses all blood products as one of [employer]es   Gabapentin Swelling   Hydrochlorothiazide Nausea And Vomiting   Lisinopril Other (See Comments)   fatigue   Lyrica [pregabalin] Swelling   Metformin And Related Nausea And Vomiting   Penicillins Nausea And Vomiting        Medication List     STOP taking these medications    calcitRIOL 0.25 MCG capsule Commonly known as: ROCALTROL       TAKE these medications    amLODipine 5 MG tablet Commonly known as: NORVASC Take 5 mg by mouth daily.   BD Pen Needle Nano U/F 32G X 4 MM Misc Generic drug: Insulin Pen Needle Inject 1 each into the skin 3 (three) times daily.   carvedilol 12.5 MG tablet Commonly known as: COREG Take 1 tablet (12.5  mg total) by mouth 2 (two) times daily.   dorzolamide-timolol 22.3-6.8 MG/ML ophthalmic solution Commonly known as: COSOPT Place 1 drop into the left eye 2 (two) times daily.   escitalopram 5 MG tablet Commonly known as: LEXAPRO Take 5 mg by mouth daily.   FreeStyle Libre 14 Day Reader Kerrin Mo See admin instructions.   FreeStyle Libre 2 Sensor Misc USE AS DIRECTED EVERY 14 DAYS   FREESTYLE LITE test strip Generic drug: glucose blood 1 each by Other route five (5) times a day.   insulin lispro 100 UNIT/ML injection Commonly known as: HUMALOG Inject 0-8 Units into the skin 3 (three) times daily before meals. SSI   Lantus SoloStar 100 UNIT/ML Solostar Pen Generic drug: insulin glargine Inject 16 Units into the skin daily.   latanoprost 0.005 % ophthalmic solution Commonly known as: XALATAN Place 1 drop into the left eye at bedtime.   losartan 100 MG tablet Commonly known as: COZAAR Take 1 tablet (100 mg total) by mouth daily.   metoCLOPramide 10 MG tablet Commonly known as: REGLAN Take 10 mg by mouth every 8 (eight) hours as needed for vomiting or nausea.   mupirocin ointment 2 % Commonly known as: BACTROBAN Apply  1 application topically 2 (two) times daily.   ondansetron 4 MG disintegrating tablet Commonly known as: ZOFRAN-ODT TAKE 1 TABLET (4 MG TOTAL) BY MOUTH AS NEEDED.   pantoprazole 40 MG tablet Commonly known as: PROTONIX Take 40 mg by mouth daily.   senna 8.6 MG tablet Commonly known as: SENOKOT Take 1 tablet by mouth daily.       Allergies  Allergen Reactions   Statins Nausea And Vomiting   Albumin Human Other (See Comments)    Refuses all blood products as one of [employer]es Refuses all blood products as one of [employer]    Gabapentin Swelling   Hydrochlorothiazide Nausea And Vomiting   Lisinopril Other (See Comments)    fatigue   Lyrica [Pregabalin] Swelling   Metformin And Related Nausea And Vomiting   Penicillins Nausea And Vomiting    The results of significant diagnostics from this hospitalization (including imaging, microbiology, ancillary and laboratory) are listed below for reference.    Significant Diagnostic Studies: CT ABDOMEN PELVIS WO CONTRAST  Result Date: 05/26/2021 CLINICAL DATA:  Abdominal pain and vomiting. EXAM: CT ABDOMEN AND PELVIS WITHOUT CONTRAST TECHNIQUE: Multidetector CT imaging of the abdomen and pelvis was performed following the standard protocol without IV contrast. COMPARISON:  None. FINDINGS: Lower chest: No acute abnormality. Hepatobiliary: No focal liver abnormality is seen. No gallstones, gallbladder wall thickening, or biliary dilatation. Pancreas: Unremarkable. No pancreatic ductal dilatation or surrounding inflammatory changes. Spleen: Normal in size without focal abnormality. Adrenals/Urinary Tract: The left adrenal gland is unremarkable. 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 No renal mass, calculi, or hydronephrosis. The bladder is unremarkable for the degree of distention. Stomach/Bowel: Small hiatal hernia. The stomach is otherwise within normal limits. No bowel wall thickening, distention, or surrounding inflammatory  changes. Normal appendix. Vascular/Lymphatic: Aortic atherosclerosis. No enlarged abdominal or pelvic lymph nodes. Reproductive: Status post hysterectomy. No adnexal masses. Other: No free fluid or pneumoperitoneum. Musculoskeletal: No acute or significant osseous findings. IMPRESSION: 1. No acute intra-abdominal process. 2. 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 3. Aortic Atherosclerosis (ICD10-I70.0). Electronically Signed   By: Titus Dubin M.D.   On: 05/26/2021 13:05   DG Chest 2 View  Result Date: 05/26/2021 CLINICAL DATA:  Vomiting and abdominal pain. Chest pain. Shortness of breath. EXAM: CHEST - 2  VIEW COMPARISON:  None. FINDINGS: The right Vas-Cath terminates near the caval atrial junction. No pneumothorax. The heart, hila, and mediastinum are normal. No pulmonary nodules or masses. No focal infiltrates. No overt edema. IMPRESSION: No active cardiopulmonary disease. Electronically Signed   By: Dorise Bullion III M.D.   On: 05/26/2021 10:53   PERIPHERAL VASCULAR CATHETERIZATION  Result Date: 05/13/2021 See surgical note for result.   Microbiology: Recent Results (from the past 240 hour(s))  Resp Panel by RT-PCR (Flu A&B, Covid) Nasopharyngeal Swab     Status: None   Collection Time: 05/26/21 12:59 PM   Specimen: Nasopharyngeal Swab; Nasopharyngeal(NP) swabs in vial transport medium  Result Value Ref Range Status   SARS Coronavirus 2 by RT PCR NEGATIVE NEGATIVE Final    Comment: (NOTE) SARS-CoV-2 target nucleic acids are NOT DETECTED.  The SARS-CoV-2 RNA is generally detectable in upper respiratory specimens during the acute phase of infection. The lowest concentration of SARS-CoV-2 viral copies this assay can detect is 138 copies/mL. A negative result does not preclude SARS-Cov-2 infection and should not be used as the sole basis for treatment or other patient management decisions. A negative result may occur with  improper specimen collection/handling, submission of specimen  other than nasopharyngeal swab, presence of viral mutation(s) within the areas targeted by this assay, and inadequate number of viral copies(<138 copies/mL). A negative result must be combined with clinical observations, patient history, and epidemiological information. The expected result is Negative.  Fact Sheet for Patients:  EntrepreneurPulse.com.au  Fact Sheet for Healthcare Providers:  IncredibleEmployment.be  This test is no t yet approved or cleared by the [place] FDA and  has been authorized for detection and/or diagnosis of SARS-CoV-2 by FDA under an Emergency Use Authorization (EUA). This EUA will remain  in effect (meaning this test can be used) for the duration of the COVID-19 declaration under Section 564(b)(1) of the Act, 21 U.S.C.section 360bbb-3(b)(1), unless the authorization is terminated  or revoked sooner.       Influenza A by PCR NEGATIVE NEGATIVE Final   Influenza B by PCR NEGATIVE NEGATIVE Final    Comment: (NOTE) The Xpert Xpress SARS-CoV-2/FLU/RSV plus assay is intended as an aid in the diagnosis of influenza from Nasopharyngeal swab specimens and should not be used as a sole basis for treatment. Nasal washings and aspirates are unacceptable for Xpert Xpress SARS-CoV-2/FLU/RSV testing.  Fact Sheet for Patients: EntrepreneurPulse.com.au  Fact Sheet for Healthcare Providers: IncredibleEmployment.be  This test is not yet approved or cleared by the [place] FDA and has been authorized for detection and/or diagnosis of SARS-CoV-2 by FDA under an Emergency Use Authorization (EUA). This EUA will remain in effect (meaning this test can be used) for the duration of the COVID-19 declaration under Section 564(b)(1) of the Act, 21 U.S.C. section 360bbb-3(b)(1), unless the authorization is terminated or revoked.  Performed at [facility], [address]  [place], [place] [address]      Labs: Basic Metabolic Panel: Recent Labs  Lab 05/26/21 1029 05/27/21 0435 05/27/21 1222 05/30/21 0245 05/30/21 1410 05/31/21 0417  NA 139 135  --  135 137 133*  K 3.6 3.3*  --  3.2* 3.0* 3.6  CL 96* 98  --  100 99 99  CO2 29 29  --  30  --  27  GLUCOSE 260* 236*  --  206* 168* 211*  BUN 36* 32*  --  18 17 23   CREATININE 4.93* 4.51*  --  2.67* 3.20* 3.63*  CALCIUM 9.2 8.4*  --  7.6*  --  7.9*  MG  --   --  2.2  --   --   --   PHOS  --   --  3.1  --   --   --    Liver Function Tests: Recent Labs  Lab 05/26/21 1029  AST 19  ALT 11  ALKPHOS 55  BILITOT 0.9  PROT 6.0*  ALBUMIN 3.4*   Recent Labs  Lab 05/26/21 1029  LIPASE 22   No results for input(s): AMMONIA in the last 168 hours. CBC: Recent Labs  Lab 05/26/21 1029 05/30/21 1410 05/31/21 0417  WBC 9.8  --  10.7*  NEUTROABS  --   --  8.5*  HGB 9.8* 8.5* 7.6*  HCT 28.4* 25.0* 22.5*  MCV 86.1  --  87.5  PLT 304  --  213   Cardiac Enzymes: No results for input(s): CKTOTAL, CKMB, CKMBINDEX, TROPONINI in the last 168 hours. BNP: BNP (last 3 results) No results for input(s): BNP in the last 8760 hours.  ProBNP (last 3 results) No results for input(s): PROBNP in the last 8760 hours.  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  Signed:        Reinette Cuneo, DO [facility]  05/31/2021, 4:58 PM

## 2021-05-31 NOTE — Progress Notes (Signed)
Patient is alert and oriented with no complaints and completed a net UF goal of 1500 

## 2021-05-31 NOTE — Anesthesia Postprocedure Evaluation (Signed)
Anesthesia Post Note  Patient: Kelli [name]  Procedure(s) Performed: CONTINUOUS AMBULATORY PERITONEAL DIALYSIS  (CAPD) CATHETER INSERTION  Patient location during evaluation: PACU Anesthesia Type: General Level of consciousness: awake and alert Pain management: pain level controlled Vital Signs Assessment: post-procedure vital signs reviewed and stable Respiratory status: spontaneous breathing, nonlabored ventilation, respiratory function stable and patient connected to nasal cannula oxygen Cardiovascular status: blood pressure returned to baseline and stable Postop Assessment: no apparent nausea or vomiting Anesthetic complications: no   No notable events documented.   Last Vitals:  Vitals:   05/31/21 0608 05/31/21 0717  BP: (!) 153/65 (!) 150/63  Pulse: 82 85  Resp:  18  Temp: 37 C 36.9 C  SpO2: 94% 94%    Last Pain:  Vitals:   05/31/21 0717  TempSrc: Oral  PainSc:                  Martha Clan

## 2021-05-31 NOTE — Plan of Care (Signed)
Discharge teaching completed with patient who is in stable condition. 

## 2021-05-31 NOTE — Plan of Care (Signed)
Continuing with plan of care. 

## 2021-05-31 NOTE — TOC CM/SW Note (Signed)
Patient has orders to discharge home today. Chart reviewed. PCP is Halina Maidens, MD. On room air. Has incision on her abdomen (ABD pad). Per HD coordinator, she can start outpatient HD on Monday. No TOC needs identified. CSW signing off.  Dayton Scrape, [place]

## 2021-05-31 NOTE — Progress Notes (Signed)
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 However she presents today with continued nausea and vomiting. Seems patient has been undergoing GI work up with [facility]. EGD on 9/28.   Patient seen and evaluated during dialysis   HEMODIALYSIS FLOWSHEET:  Blood Flow Rate (mL/min): 300 mL/min Arterial Pressure (mmHg): -110 mmHg Venous Pressure (mmHg): 210 mmHg Transmembrane Pressure (mmHg): 70 mmHg Ultrafiltration Rate (mL/min): 670 mL/min Dialysate Flow Rate (mL/min): 500 ml/min Conductivity: Machine : 13.9 Conductivity: Machine : 13.9 Dialysis Fluid Bolus: Normal Saline Bolus Amount (mL): 250 mL  Seated in chair Abdominal soreness from PD catheter placement  Objective:  Vital signs in last 24 hours:  Temp:  [97.6 F (36.4 C)-98.9 F (37.2 C)] 98.6 F (37 C) (10/07 0845) Pulse Rate:  [75-87] 85 (10/07 0717) Resp:  [14-27] 18 (10/07 1145) BP: (147-207)/(63-131) 179/73 (10/07 1145) SpO2:  [90 %-100 %] 94 % (10/07 0717) Weight:  [78.1 kg-78.8 kg] 78.8 kg (10/07 0845)  Weight change: -1.1 kg Filed Weights   05/29/21 1247 05/30/21 1411 05/31/21 0845  Weight: 78.1 kg 78.1 kg 78.8 kg    Intake/Output: I/O last 3 completed shifts: In: 425 [P.O.:125; I.V.:250; IV Piggyback:50] Out: 5 [Blood:5]   Intake/Output this shift:  No intake/output data recorded.  Physical Exam: General: NAD, sitting in chair  Head: Normocephalic, atraumatic. Moist oral mucosal membranes, facial edema  (improving)  Eyes: Anicteric  Lungs:  Clear to auscultation, normal breathing  Heart: Regular rate and rhythm  Abdomen:  Soft, tender, PD catheter  Extremities:  + peripheral edema.  Neurologic: Nonfocal, moving all four extremities  Skin: No lesions  Access: Rt IJ Permcath, PD catheter placed on 62/94/76    Basic Metabolic Panel: Recent Labs  Lab 05/26/21 1029 05/27/21 0435 05/27/21 1222 05/30/21 0245 05/30/21 1410 05/31/21 0417  NA 139 135  --  135 137 133*  K 3.6 3.3*  --  3.2* 3.0* 3.6  CL 96* 98  --  100 99 99  CO2 29 29  --  30  --  27  GLUCOSE 260* 236*  --  206* 168* 211*  BUN 36* 32*  --  18 17 23   CREATININE 4.93* 4.51*  --  2.67* 3.20* 3.63*  CALCIUM 9.2 8.4*  --  7.6*  --  7.9*  MG  --   --  2.2  --   --   --   PHOS  --   --  3.1  --   --   --      Liver Function Tests: Recent Labs  Lab 05/26/21 1029  AST 19  ALT 11  ALKPHOS 55  BILITOT 0.9  PROT 6.0*  ALBUMIN 3.4*    Recent Labs  Lab 05/26/21 1029  LIPASE 22    No results for input(s): AMMONIA in the last 168 hours.  CBC: Recent Labs  Lab 05/26/21 1029 05/30/21 1410 05/31/21 0417  WBC 9.8  --  10.7*  NEUTROABS  --   --  8.5*  HGB 9.8* 8.5* 7.6*  HCT 28.4* 25.0* 22.5*  MCV 86.1  --  87.5  PLT 304  --  213     Cardiac Enzymes: No results for input(s): CKTOTAL, CKMB, CKMBINDEX, TROPONINI in the last 168 hours.  BNP: Invalid input(s): POCBNP  CBG: Recent Labs  Lab 05/30/21 0735 05/30/21 1115 05/30/21 1603 05/30/21 2041 05/31/21 0717  GLUCAP 273* 189* 145* 360* 180*     Microbiology: Results for orders placed or performed during the hospital encounter of 05/26/21  Resp Panel by RT-PCR (Flu A&B, Covid) Nasopharyngeal Swab     Status: None   Collection Time: 05/26/21 12:59 PM   Specimen: Nasopharyngeal Swab; Nasopharyngeal(NP) swabs in vial transport medium  Result Value Ref Range Status   SARS Coronavirus 2 by RT PCR NEGATIVE NEGATIVE Final    Comment:  (NOTE) SARS-CoV-2 target nucleic acids are NOT DETECTED.  The SARS-CoV-2 RNA is generally detectable in upper respiratory specimens during the acute phase of infection. The lowest concentration of SARS-CoV-2 viral copies this assay can detect is 138 copies/mL. A negative result does not preclude SARS-Cov-2 infection and should not be used as the sole basis for treatment or other patient management decisions. A negative result may occur with  improper specimen collection/handling, submission of specimen other than nasopharyngeal swab, presence of viral mutation(s) within the areas targeted by this assay, and inadequate number of viral copies(<138 copies/mL). A negative result must be combined with clinical observations, patient history, and epidemiological information. The expected result is Negative.  Fact Sheet for Patients:  EntrepreneurPulse.com.au  Fact Sheet for Healthcare Providers:  IncredibleEmployment.be  This test is no t yet approved or cleared by the [place] FDA and  has been authorized for detection and/or diagnosis of SARS-CoV-2 by FDA under an Emergency Use Authorization (EUA). This EUA will remain  in effect (meaning this test can be used) for the duration of the COVID-19 declaration under Section 564(b)(1) of the Act, 21 U.S.C.section 360bbb-3(b)(1), unless the authorization is terminated  or revoked sooner.       Influenza A by PCR NEGATIVE NEGATIVE Final   Influenza B by PCR NEGATIVE NEGATIVE Final    Comment: (NOTE) The Xpert Xpress SARS-CoV-2/FLU/RSV plus assay is intended as an aid in the diagnosis of influenza from Nasopharyngeal swab specimens and should not be used as a sole basis for treatment. Nasal washings and aspirates are unacceptable for Xpert Xpress SARS-CoV-2/FLU/RSV testing.  Fact Sheet for Patients: EntrepreneurPulse.com.au  Fact Sheet for Healthcare  Providers: IncredibleEmployment.be  This test is not yet approved or cleared by the [place] FDA and has been authorized for detection and/or diagnosis of SARS-CoV-2 by FDA under an Emergency Use Authorization (EUA). This EUA will remain in effect (meaning this test can be used) for the duration of the COVID-19 declaration under Section 564(b)(1) of the Act, 21 U.S.C. section 360bbb-3(b)(1), unless the authorization is terminated or revoked.  Performed at [facility], [address] [place], [place] [address]     Coagulation Studies: No results for input(s): LABPROT, INR in the last 72 hours.  Urinalysis: No results for input(s): COLORURINE, LABSPEC, PHURINE, GLUCOSEU, HGBUR, BILIRUBINUR, KETONESUR, PROTEINUR, UROBILINOGEN, NITRITE, LEUKOCYTESUR in the last 72 hours.  Invalid input(s): APPERANCEUR     Imaging: No results found.   Medications:       amLODipine  5 mg Oral Daily   carvedilol  12.5 mg Oral BID   Chlorhexidine Gluconate Cloth  6 each Topical Q0600   escitalopram  5 mg Oral Daily   heparin injection (subcutaneous)  5,000 Units Subcutaneous Q8H   heparin sodium (  porcine)       insulin aspart  0-5 Units Subcutaneous QHS   insulin aspart  0-9 Units Subcutaneous TID WC   insulin aspart  2 Units Subcutaneous TID WC   insulin glargine-yfgn  16 Units Subcutaneous Daily   metoCLOPramide (REGLAN) injection  10 mg Intravenous TID AC   mupirocin ointment  1 application Topical BID   pantoprazole (PROTONIX) IV  40 mg Intravenous Q12H   senna  1 tablet Oral Daily   acetaminophen **OR** acetaminophen, labetalol, nitroGLYCERIN, oxyCODONE, traZODone  Assessment/ Plan:  Kelli [name] is a 60 y.o. 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 Unclear if her current symptoms are due to uremia. Dialysis initiated 05/27/21. Receiving dialysis seated in recliner, tolerating well.  Patient desires peritoneal dialysis. Appreciate general surgery placing PD catheter on 05/30/21. Patient will be discharged on hemodialysis and transition to peritoneal dialysis outpatient. Dialysis coordinator aware of patient and arranging outpatient treatment at [facility]. Next treatment scheduled for Monday.  Hypertension with chronic kidney disease: 179/73. Initially believed driven by nausea and vomiting. No recent complaints of nausea and vomiting. Continue amlodipine and carvedilol. Holding losartan. Has not received PRN Labetalol since yesterday  Diabetes mellitus type II with chronic kidney disease: insulin dependent hemoglobin A1c of 7.5%. Not well controlled. Complicated by diabetic gastroparesis. Elevated at times  Anemia of chronic kidney disease: hemoglobin 7.6. normocytic.   Secondary Hyperparathyroidism: PTH-89 Phos 3.1  Hypokalemia at 3.6.    LOS: [place] 10/7/202212:10 PM

## 2021-05-31 NOTE — Discharge Instructions (Signed)
Peritoneal Dialysis Catheter Placement, Care After The following information offers guidance on how to care for yourself after your procedure. Your health care provider may also give you more specific instructions. If you have problems or questions, contact your health care provider. What can I expect after the procedure? After the procedure, it is common to have some pain or discomfort in your abdomen and your incision area. You may need to wait 2 weeks after your procedure before you can start peritoneal dialysis treatment. If you need dialysis before that time, your health care provider may begin peritoneal dialysis treatment early or offer kidney dialysis treatments (hemodialysis) until you heal. Follow these instructions at home: Incision care Follow instructions from your health care provider about how to take care of your incision or incisions. Make sure you: Wash your hands with soap and water for at least 20 seconds before and after you change your bandage (dressing). If soap and water are not available, use hand sanitizer. Change your dressing only as told by your health care provider. Your health care provider may tell you not to touch or change your dressing. Leave stitches (sutures), staples, skin glue, or adhesive strips in place. These skin closures may need to stay in place for 2 weeks or longer. If adhesive strip edges start to loosen and curl up, you may trim the loose edges. Do not remove adhesive strips completely unless your health care provider tells you to do that. Check your incision areas every day for signs of infection. If you were instructed not to touch or change your dressing, look at your dressing for signs of infection. Check for: Redness, swelling, or more pain. Fluid or blood. Warmth. Pus or a bad smell. Medicines Take over-the-counter and prescription medicines only as told by your health care provider. If you were prescribed an antibiotic medicine, use it as told  by your health care provider. Do not stop using the antibiotic even if you start to feel better. Ask your health care provider if the medicine prescribed to you requires you to avoid driving or using machinery. Driving Do not drive or ride in a car until your health care provider approves. Your seat belt could move the catheter out of position or cause irritation by rubbing on your incision. Activity Rest and limit your activity. Do not lift anything that is heavier than 10 lb (4.5 kg), or the limit that you are told, until your health care provider says that it is safe. Return to your normal activities as told by your health care provider. Ask your health care provider what activities are safe for you. Managing constipation Your condition may cause constipation. To prevent or treat constipation, you may need to: Drink enough fluid to keep your urine pale yellow. Take over-the-counter or prescription medicines. Eat foods that are high in fiber, such as beans, whole grains, and fresh fruits and vegetables. Limit foods that are high in fat and processed sugars, such as fried or sweet foods. General instructions Do not use any products that contain nicotine or tobacco. These products include cigarettes, chewing tobacco, and vaping devices, such as e-cigarettes. If you need help quitting, ask your health care provider. Follow instructions from your health care provider about eating or drinking restrictions. Do not take baths, swim, or use a hot tub until your health care provider approves. Ask your health care provider if you may take showers. You may only be allowed to take sponge baths. Wear loose-fitting clothing that keeps the catheter  covered so that it cannot get caught on something. Keep your catheter clean and dry. Keep all follow-up visits. This is important. Contact a health care provider if: You have a fever or chills. You have warmth, redness, swelling, or more pain around an  incision. You have fluid or blood coming from an incision. You have pus or a bad smell coming from an incision. You cannot eat or drink without vomiting. Get help right away if: You have problems breathing. You are confused. You have trouble speaking. You have severe pain in your abdomen that does not get better with treatment. You have bright red blood in your stool (feces), or your stool is dark black and looks like tar. These symptoms may represent a serious problem that is an emergency. Do not wait to see if the symptoms will go away. Get medical help right away. Call your local emergency services (911 in the [place] Do not drive yourself to the hospital. Summary After the procedure, it is common to have some pain or discomfort in your abdomen, your incision area, or both. You may have to wait 2 weeks after your procedure before you can start peritoneal dialysis treatment. Check your incision area every day for signs of infection. Get medical help right away if you have severe pain in your abdomen that does not get better with treatment. This information is not intended to replace advice given to you by your health care provider. Make sure you discuss any questions you have with your health care provider. Document Revised: 03/29/2020 Document Reviewed: 03/29/2020 Elsevier Patient Education  2022 [employer].

## 2021-06-03 DIAGNOSIS — Z992 Dependence on renal dialysis: Secondary | ICD-10-CM | POA: Diagnosis not present

## 2021-06-03 DIAGNOSIS — N186 End stage renal disease: Secondary | ICD-10-CM | POA: Diagnosis not present

## 2021-06-03 DIAGNOSIS — N2581 Secondary hyperparathyroidism of renal origin: Secondary | ICD-10-CM | POA: Diagnosis not present

## 2021-06-04 ENCOUNTER — Ambulatory Visit: Payer: Self-pay | Admitting: *Deleted

## 2021-06-04 DIAGNOSIS — E113513 Type 2 diabetes mellitus with proliferative diabetic retinopathy with macular edema, bilateral: Secondary | ICD-10-CM | POA: Diagnosis not present

## 2021-06-04 DIAGNOSIS — Z992 Dependence on renal dialysis: Secondary | ICD-10-CM | POA: Diagnosis not present

## 2021-06-04 DIAGNOSIS — N2581 Secondary hyperparathyroidism of renal origin: Secondary | ICD-10-CM | POA: Diagnosis not present

## 2021-06-04 DIAGNOSIS — N186 End stage renal disease: Secondary | ICD-10-CM | POA: Diagnosis not present

## 2021-06-04 NOTE — Telephone Encounter (Signed)
Patient called and says she's concerned about drinking the colon prep for the colonoscopy scheduled on Friday 06/07/21 because she's been vomiting x 3 days. She says she thinks it's gastroparesis, but has not been tested. She says she's vomited 4 times today already. She says anything that she drinks she vomits it up. She says she hasn't eaten anything today. She says her abdominal pain is a 4-5/10 and it's a burning pain. Denies any other symptoms. I advised an appointment, she asked for virtual. Appointment scheduled for tomorrow at 1540 with Dr. Army Melia, care advice given, patient verbalized understanding.  Message from Valere Dross sent at 06/04/2021  3:28 PM EDT  Pt called in stating she is having a upset stomach, pt states she is suppose to have a colonoscopy screening coming up and has to drink some liquid and she wants to make sure she'll be able to drink it. Please advise.      Reason for Disposition  [1] MILD or MODERATE vomiting AND [2] present > 48 hours (2 days) (Exception: mild vomiting with associated diarrhea)  Answer Assessment - Initial Assessment Questions 1. VOMITING SEVERITY: "How many times have you vomited in the past 24 hours?"     - MILD:  1 - 2 times/day    - MODERATE: 3 - 5 times/day, decreased oral intake without significant weight loss or symptoms of dehydration    - SEVERE: 6 or more times/day, vomits everything or nearly everything, with significant weight loss, symptoms of dehydration      4 times today 2. ONSET: "When did the vomiting begin?"      3 days ago 3. FLUIDS: "What fluids or food have you vomited up today?" "Have you been able to keep any fluids down?"     No food today, drinking liquids and then vomit it up 4. ABDOMINAL PAIN: "Are your having any abdominal pain?" If yes : "How bad is it and what does it feel like?" (e.g., crampy, dull, intermittent, constant)      Yes, burning acid feeling pain at 5-6/10 5. DIARRHEA: "Is there any diarrhea?" If Yes,  ask: "How many times today?"      No 6. CONTACTS: "Is there anyone else in the family with the same symptoms?"      No 7. CAUSE: "What do you think is causing your vomiting?"     Gastroparesis, not tested for it yet 8. HYDRATION STATUS: "Any signs of dehydration?" (e.g., dry mouth [not only dry lips], too weak to stand) "When did you last urinate?"     Not yet, urinated a couple times today 9. OTHER SYMPTOMS: "Do you have any other symptoms?" (e.g., fever, headache, vertigo, vomiting blood or coffee grounds, recent head injury)     No 10. PREGNANCY: "Is there any chance you are pregnant?" "When was your last menstrual period?"       No  Protocols used: Vomiting-A-AH

## 2021-06-04 NOTE — Telephone Encounter (Signed)
Pt called in stating she is having a upset stomach, pt states she is suppose to have a colonoscopy screening coming up and has to drink some liquid and she wants to make sure she'll be able to drink it. Please advise.   Called patient to review symptoms and answer questions regarding colonoscopy. No answer, left message on voicemail  to call clinic back.

## 2021-06-05 ENCOUNTER — Telehealth: Payer: BC Managed Care – PPO | Admitting: Internal Medicine

## 2021-06-05 DIAGNOSIS — Z992 Dependence on renal dialysis: Secondary | ICD-10-CM | POA: Diagnosis not present

## 2021-06-05 DIAGNOSIS — E8779 Other fluid overload: Secondary | ICD-10-CM | POA: Diagnosis not present

## 2021-06-05 DIAGNOSIS — N2581 Secondary hyperparathyroidism of renal origin: Secondary | ICD-10-CM | POA: Diagnosis not present

## 2021-06-05 DIAGNOSIS — N186 End stage renal disease: Secondary | ICD-10-CM | POA: Diagnosis not present

## 2021-06-06 DIAGNOSIS — N2581 Secondary hyperparathyroidism of renal origin: Secondary | ICD-10-CM | POA: Diagnosis not present

## 2021-06-06 DIAGNOSIS — Z992 Dependence on renal dialysis: Secondary | ICD-10-CM | POA: Diagnosis not present

## 2021-06-06 DIAGNOSIS — N186 End stage renal disease: Secondary | ICD-10-CM | POA: Diagnosis not present

## 2021-06-08 DIAGNOSIS — N186 End stage renal disease: Secondary | ICD-10-CM | POA: Diagnosis not present

## 2021-06-08 DIAGNOSIS — Z992 Dependence on renal dialysis: Secondary | ICD-10-CM | POA: Diagnosis not present

## 2021-06-08 DIAGNOSIS — E8779 Other fluid overload: Secondary | ICD-10-CM | POA: Diagnosis not present

## 2021-06-08 DIAGNOSIS — N2581 Secondary hyperparathyroidism of renal origin: Secondary | ICD-10-CM | POA: Diagnosis not present

## 2021-06-10 ENCOUNTER — Telehealth: Payer: Self-pay

## 2021-06-10 DIAGNOSIS — N2581 Secondary hyperparathyroidism of renal origin: Secondary | ICD-10-CM | POA: Diagnosis not present

## 2021-06-10 DIAGNOSIS — Z992 Dependence on renal dialysis: Secondary | ICD-10-CM | POA: Diagnosis not present

## 2021-06-10 DIAGNOSIS — N186 End stage renal disease: Secondary | ICD-10-CM | POA: Diagnosis not present

## 2021-06-10 DIAGNOSIS — E8779 Other fluid overload: Secondary | ICD-10-CM | POA: Diagnosis not present

## 2021-06-10 NOTE — Telephone Encounter (Signed)
Copied from [place] [id number]. Topic: Referral - Request for Referral >> Jun 10, 2021  1:52 PM Yvette Rack wrote: Has patient seen PCP for this complaint? Yes.   *If NO, is insurance requiring patient see PCP for this issue before PCP can refer them? Referral for which specialty: GI Preferred provider/office: Dr. Lucilla Lame  ph# [phone]  fax# [phone] Reason for referral: pt stated Dr. Army Melia is aware of her GI issues

## 2021-06-10 NOTE — Telephone Encounter (Signed)
Pt called back in stating she really needs this referral sent over, pt states they are waiting and could possibly have her seen asap, pt gave there contact number to fax it over, please advise  Fax:[phone] Ph:[phone]

## 2021-06-11 ENCOUNTER — Other Ambulatory Visit: Payer: Self-pay

## 2021-06-11 DIAGNOSIS — K5904 Chronic idiopathic constipation: Secondary | ICD-10-CM

## 2021-06-11 DIAGNOSIS — R112 Nausea with vomiting, unspecified: Secondary | ICD-10-CM

## 2021-06-11 NOTE — Telephone Encounter (Signed)
Referral placed.

## 2021-06-12 DIAGNOSIS — N2581 Secondary hyperparathyroidism of renal origin: Secondary | ICD-10-CM | POA: Diagnosis not present

## 2021-06-12 DIAGNOSIS — E8779 Other fluid overload: Secondary | ICD-10-CM | POA: Diagnosis not present

## 2021-06-12 DIAGNOSIS — Z992 Dependence on renal dialysis: Secondary | ICD-10-CM | POA: Diagnosis not present

## 2021-06-12 DIAGNOSIS — N186 End stage renal disease: Secondary | ICD-10-CM | POA: Diagnosis not present

## 2021-06-13 ENCOUNTER — Other Ambulatory Visit: Payer: Self-pay

## 2021-06-13 ENCOUNTER — Ambulatory Visit (INDEPENDENT_AMBULATORY_CARE_PROVIDER_SITE_OTHER): Payer: BC Managed Care – PPO | Admitting: Gastroenterology

## 2021-06-13 ENCOUNTER — Encounter: Payer: Self-pay | Admitting: Gastroenterology

## 2021-06-13 VITALS — BP 200/90 | HR 81 | Temp 98.4°F | Ht 67.0 in | Wt 163.8 lb

## 2021-06-13 DIAGNOSIS — Z8619 Personal history of other infectious and parasitic diseases: Secondary | ICD-10-CM

## 2021-06-13 DIAGNOSIS — R112 Nausea with vomiting, unspecified: Secondary | ICD-10-CM

## 2021-06-13 MED ORDER — SUCRALFATE 1 GM/10ML PO SUSP: 1.0000 g | Freq: Four times a day (QID) | ORAL | 0 refills | Status: DC

## 2021-06-13 MED ORDER — PANTOPRAZOLE SODIUM 40 MG PO TBEC: 40.0000 mg | DELAYED_RELEASE_TABLET | Freq: Two times a day (BID) | ORAL | 0 refills | Status: DC

## 2021-06-13 NOTE — Patient Instructions (Signed)
I will call you later this afternoon with Gastric Emptying appointment at [facility]

## 2021-06-14 DIAGNOSIS — N186 End stage renal disease: Secondary | ICD-10-CM | POA: Diagnosis not present

## 2021-06-14 DIAGNOSIS — N2581 Secondary hyperparathyroidism of renal origin: Secondary | ICD-10-CM | POA: Diagnosis not present

## 2021-06-14 DIAGNOSIS — Z992 Dependence on renal dialysis: Secondary | ICD-10-CM | POA: Diagnosis not present

## 2021-06-14 DIAGNOSIS — E8779 Other fluid overload: Secondary | ICD-10-CM | POA: Diagnosis not present

## 2021-06-14 LAB — HCV RNA QUANT: Hepatitis C Quantitation: NOT DETECTED IU/mL

## 2021-06-14 NOTE — Progress Notes (Signed)
Kelli [name] [address]  [place]  [place], [place] [address]  Main: [phone]  Fax: [phone]   Gastroenterology Consultation  Referring Provider:     Glean Hess, MD Primary Care Physician:  Glean Hess, MD Reason for Consultation:     Nausea vomiting        HPI:    Chief Complaint  Patient presents with   Nausea    Pt reports she is not able to keep even water down and has been vomiting over 5 times daily.... Pt reports she does feel as though food feels that it is getting stuck when she does eat.... Pt had been seeing [facility] and had upcoming procedure had to be canceled as pt could not tolerate prep....   Constipation    [place] 1-2 BM a week... but pt is not eating or drinking much food or liquids   New Patient (Initial Visit)    Kelli [name] is a 60 y.o. y/o female referred for consultation & management  by Dr. Army Melia, Jesse Sans, MD. patient previously seen by [facility], note in care everywhere from February 18, 2021 reviewed.  Patient was seen for similar symptoms, and note reports history of recurrent episodes of nausea vomiting, with history of normal gastric emptying study in 2017.  They also state that patient had an endoscopy in 2018 that was essentially normal aside from bilious fluid.  They recommended upper endoscopy, Gaviscon after larger meals before bed to prevent gastric content from being reflux.  She subsequently underwent upper endoscopy on May 22, 2021 that showed LA grade C reflux esophagitis, with biopsies done for EOE.  Normal stomach and duodenum.  Patient was prescribed PPI but she states that she is not taking it.  She was prescribed Prilosec and states this was making her symptoms worse and so she stopped taking it.  Reports symptoms ongoing for at least 2 to 3 years, with frequent nausea and vomiting.  Denies any drug use or marijuana use.  Occurs with or without meals.  Denies any abdominal pain.  Denies postprandial pain.   Has history of CKD and sees transplant clinic at [facility] as well.  Denies any blood in stool.  Colonoscopy was canceled as patient could not tolerate prep.  She had a colonoscopy in 2015.  Procedure report not available.  Past Medical History:  Diagnosis Date   Chronic kidney disease    Diabetes mellitus without complication ([place])    Migraine    Staph infection     Past Surgical History:  Procedure Laterality Date   ABDOMINAL HYSTERECTOMY     CAPD INSERTION N/A 05/30/2021   Procedure: CONTINUOUS AMBULATORY PERITONEAL DIALYSIS  (CAPD) CATHETER INSERTION;  Surgeon: Jules Husbands, MD;  Location: [facility] ORS;  Service: General;  Laterality: N/A;   COLONOSCOPY W/ POLYPECTOMY     DIALYSIS/PERMA CATHETER INSERTION N/A 05/13/2021   Procedure: DIALYSIS/PERMA CATHETER INSERTION;  Surgeon: Algernon Huxley, MD;  Location: [place] [facility];  Service: Cardiovascular;  Laterality: N/A;   ESOPHAGOGASTRODUODENOSCOPY  03/02/2017   Normal   HEMORRHOID SURGERY      Prior to Admission medications   Medication Sig Start Date End Date Taking? Authorizing Provider  amLODipine (NORVASC) 5 MG tablet Take 5 mg by mouth daily. 03/07/21  Yes [provider]  BD PEN NEEDLE NANO U/F 32G X 4 MM MISC Inject 1 each into the skin 3 (three) times daily. 09/27/20  Yes [provider]  carvedilol (COREG)  12.5 MG tablet Take 1 tablet (12.5 mg total) by mouth 2 (two) times daily. 04/09/21  Yes Glean Hess, MD  Continuous Blood Gluc Receiver (FREESTYLE LIBRE 50 DAY READER) DEVI See admin instructions. 04/06/20  Yes [provider]  Continuous Blood Gluc Sensor (FREESTYLE LIBRE 2 SENSOR) MISC USE AS DIRECTED EVERY 14 DAYS 05/07/21  Yes Glean Hess, MD  dorzolamide-timolol (COSOPT) 22.3-6.8 MG/ML ophthalmic solution Place 1 drop into the left eye 2 (two) times daily. 04/30/21  Yes [provider]  escitalopram (LEXAPRO) 5 MG tablet Take 5 mg by mouth daily. 10/12/20  Yes [provider]  glucose blood (FREESTYLE LITE) test strip 1 each by Other route five (5) times a day.   Yes [provider]  insulin glargine (LANTUS SOLOSTAR) 100 UNIT/ML Solostar Pen Inject 16 Units into the skin daily.   Yes [provider]  insulin lispro (HUMALOG) 100 UNIT/ML injection Inject 0-8 Units into the skin 3 (three) times daily before meals. SSI   Yes [provider]  latanoprost (XALATAN) 0.005 % ophthalmic solution Place 1 drop into the left eye at bedtime. 05/01/21  Yes [provider]  metoCLOPramide (REGLAN) 10 MG tablet Take 10 mg by mouth every 8 (eight) hours as needed for vomiting or nausea. 04/03/21  Yes [provider]  mupirocin ointment (BACTROBAN) 2 % Apply 1 application topically 2 (two) times daily. 07/30/20  Yes Glean Hess, MD  pantoprazole (PROTONIX) 40 MG tablet Take 1 tablet (40 mg total) by mouth 2 (two) times daily. 06/13/21  Yes Virgel Manifold, MD  senna (SENOKOT) 8.6 MG tablet Take 1 tablet by mouth daily. 10/25/20  Yes [provider]  sucralfate (CARAFATE) 1 GM/10ML suspension Take 10 mLs (1 g total) by mouth 4 (four) times daily. 06/13/21  Yes Virgel Manifold, MD    Family History  Problem Relation Age of Onset   Heart failure Mother    Diabetes Mother    Breast cancer Neg Hx      Social History   Tobacco Use   Smoking status: Former   Smokeless tobacco: Never  Vaping Use   Vaping Use: Never used  Substance Use Topics   Alcohol use: No   Drug use: No    Allergies as of 06/13/2021 - Review Complete 06/13/2021  Allergen Reaction Noted   Statins Nausea And Vomiting 09/17/2020   Albumin human Other (See Comments) 04/19/2021   Gabapentin Swelling 09/22/2016   Hydrochlorothiazide Nausea And Vomiting 06/13/2019   Lisinopril Other (See Comments) 08/26/2016   Lyrica [pregabalin] Swelling 09/22/2016   Metformin and related Nausea And Vomiting 09/22/2016   Penicillins Nausea And  Vomiting 09/03/2015    Review of Systems:    All systems reviewed and negative except where noted in HPI.   Physical Exam:  Constitutional: General:   Alert,  Well-developed, well-nourished, pleasant and cooperative in NAD BP (!) 200/90   Pulse 81   Temp 98.4 F (36.9 C) (Oral)   Ht 5\' 7"  (1.702 m)   Wt 163 lb 12.8 oz (74.3 kg)   BMI 25.65 kg/m   Eyes:  Sclera clear, no icterus.   Conjunctiva pink. PERRLA  Ears:  No scars, lesions or masses, Normal auditory acuity. Nose:  No deformity, discharge, or lesions. Mouth:  No deformity or lesions, oropharynx pink & moist.  Neck:  Supple; no masses or thyromegaly.  Respiratory: Normal respiratory effort, Normal percussion  Gastrointestinal: Soft, non-tender and non-distended without masses, hepatosplenomegaly or  hernias noted.  No guarding or rebound tenderness.     Cardiac: No clubbing or edema.  No cyanosis. Normal posterior tibial pedal pulses noted.  Lymphatic:  No significant cervical or axillary adenopathy.  Psych:  Alert and cooperative. Normal mood and affect.  Musculoskeletal:  Normal gait. Head normocephalic, atraumatic. Symmetrical without gross deformities. 5/5 Upper and Lower extremity strength bilaterally.  Skin: Warm. Intact without significant lesions or rashes. No jaundice.  Neurologic:  Face symmetrical, tongue midline, Normal sensation to touch;  grossly normal neurologically.  Psych:  Alert and oriented x3, Alert and cooperative. Normal mood and affect.   Labs: CBC    Component Value Date/Time   WBC 10.7 (H) 05/31/2021 0417   RBC 2.57 (L) 05/31/2021 0417   HGB 7.6 (L) 05/31/2021 0417   HCT 22.5 (L) 05/31/2021 0417   PLT 213 05/31/2021 0417   MCV 87.5 05/31/2021 0417   MCH 29.6 05/31/2021 0417   MCHC 33.8 05/31/2021 0417   RDW 13.2 05/31/2021 0417   LYMPHSABS 1.2 05/31/2021 0417   MONOABS 0.9 05/31/2021 0417   EOSABS 0.0 05/31/2021 0417   BASOSABS 0.0 05/31/2021 0417   CMP     Component  Value Date/Time   NA 133 (L) 05/31/2021 0417   NA 140 04/10/2021 1613   K 3.6 05/31/2021 0417   CL 99 05/31/2021 0417   CO2 27 05/31/2021 0417   GLUCOSE 211 (H) 05/31/2021 0417   BUN 23 05/31/2021 0417   BUN 40 (H) 04/10/2021 1613   CREATININE 3.63 (H) 05/31/2021 0417   CALCIUM 7.9 (L) 05/31/2021 0417   PROT 6.0 (L) 05/26/2021 1029   ALBUMIN 3.4 (L) 05/26/2021 1029   AST 19 05/26/2021 1029   ALT 11 05/26/2021 1029   ALKPHOS 55 05/26/2021 1029   BILITOT 0.9 05/26/2021 1029   GFRNONAA 14 (L) 05/31/2021 0417   GFRAA 38 (L) 07/18/2019 1427   06/03/2021: Hemoglobin  10.5 Alkaline phosphatase 73  Hepatitis C antibody previously positive and then subsequently negative, most recently on 06/03/2021  Imaging Studies: CT abdomen pelvis 04/24/2021 Small hiatal hernia with wall thickening, possible esophagitis  Assessment and Plan:   KATALAYA BEEL is a 60 y.o. y/o female has been referred for chronic nausea vomiting, with recent EGD at [facility] showing grade C esophagitis and patient is not taking PPI, with biopsies negative for EOE  Compliance with PPI encouraged given grade C esophagitis seen as this may be contributing to her symptoms as well Since she did not tolerate omeprazole well, we will try another PPI to see if she is able to tolerate it better  We will also prescribe sucralfate since she is frustrated with her symptoms to see if this helps her as well  Her gastric emptying study was previously normal but it has been years and she does have diabetes.  Therefore, reasonable to repeat gastric emptying study at this time as well  Hep C antibody previously positive but was most recently negative.  We will check RNA quant to complete work-up as I do not see that this was done before.  No indication for urgent colonoscopy.  It appears a colonoscopy was attempted recently at [facility] but patient did not tolerate prep.  She is not having any blood in the stool, and her main concern is nausea  vomiting.  Therefore, we will address that first and then when appropriate, consider colonoscopy  Dr Kelli [name]  Speech recognition software was used to dictate the above note.

## 2021-06-17 DIAGNOSIS — N2581 Secondary hyperparathyroidism of renal origin: Secondary | ICD-10-CM | POA: Diagnosis not present

## 2021-06-17 DIAGNOSIS — N186 End stage renal disease: Secondary | ICD-10-CM | POA: Diagnosis not present

## 2021-06-17 DIAGNOSIS — Z23 Encounter for immunization: Secondary | ICD-10-CM | POA: Diagnosis not present

## 2021-06-17 DIAGNOSIS — Z992 Dependence on renal dialysis: Secondary | ICD-10-CM | POA: Diagnosis not present

## 2021-06-19 DIAGNOSIS — Z992 Dependence on renal dialysis: Secondary | ICD-10-CM | POA: Diagnosis not present

## 2021-06-19 DIAGNOSIS — Z008 Encounter for other general examination: Secondary | ICD-10-CM | POA: Diagnosis not present

## 2021-06-19 DIAGNOSIS — N186 End stage renal disease: Secondary | ICD-10-CM | POA: Diagnosis not present

## 2021-06-19 DIAGNOSIS — Z23 Encounter for immunization: Secondary | ICD-10-CM | POA: Diagnosis not present

## 2021-06-19 DIAGNOSIS — N2581 Secondary hyperparathyroidism of renal origin: Secondary | ICD-10-CM | POA: Diagnosis not present

## 2021-06-21 DIAGNOSIS — Z992 Dependence on renal dialysis: Secondary | ICD-10-CM | POA: Diagnosis not present

## 2021-06-21 DIAGNOSIS — Z23 Encounter for immunization: Secondary | ICD-10-CM | POA: Diagnosis not present

## 2021-06-21 DIAGNOSIS — N186 End stage renal disease: Secondary | ICD-10-CM | POA: Diagnosis not present

## 2021-06-21 DIAGNOSIS — N2581 Secondary hyperparathyroidism of renal origin: Secondary | ICD-10-CM | POA: Diagnosis not present

## 2021-06-24 ENCOUNTER — Ambulatory Visit: Payer: BC Managed Care – PPO | Admitting: Gastroenterology

## 2021-06-24 DIAGNOSIS — N2581 Secondary hyperparathyroidism of renal origin: Secondary | ICD-10-CM | POA: Diagnosis not present

## 2021-06-24 DIAGNOSIS — N186 End stage renal disease: Secondary | ICD-10-CM | POA: Diagnosis not present

## 2021-06-24 DIAGNOSIS — Z992 Dependence on renal dialysis: Secondary | ICD-10-CM | POA: Diagnosis not present

## 2021-06-26 DIAGNOSIS — N2581 Secondary hyperparathyroidism of renal origin: Secondary | ICD-10-CM | POA: Diagnosis not present

## 2021-06-26 DIAGNOSIS — N186 End stage renal disease: Secondary | ICD-10-CM | POA: Diagnosis not present

## 2021-06-26 DIAGNOSIS — Z992 Dependence on renal dialysis: Secondary | ICD-10-CM | POA: Diagnosis not present

## 2021-06-29 DIAGNOSIS — N2581 Secondary hyperparathyroidism of renal origin: Secondary | ICD-10-CM | POA: Diagnosis not present

## 2021-06-29 DIAGNOSIS — Z992 Dependence on renal dialysis: Secondary | ICD-10-CM | POA: Diagnosis not present

## 2021-06-29 DIAGNOSIS — N186 End stage renal disease: Secondary | ICD-10-CM | POA: Diagnosis not present

## 2021-07-01 ENCOUNTER — Other Ambulatory Visit: Payer: Self-pay | Admitting: Internal Medicine

## 2021-07-01 DIAGNOSIS — Z992 Dependence on renal dialysis: Secondary | ICD-10-CM | POA: Diagnosis not present

## 2021-07-01 DIAGNOSIS — N2581 Secondary hyperparathyroidism of renal origin: Secondary | ICD-10-CM | POA: Diagnosis not present

## 2021-07-01 DIAGNOSIS — N186 End stage renal disease: Secondary | ICD-10-CM | POA: Diagnosis not present

## 2021-07-01 NOTE — Telephone Encounter (Signed)
Requested medications are due for refill today.  Unsure  Requested medications are on the active medications list.  no  Last refill. 04/05/2021  Future visit scheduled.   No  Notes to clinic.  Medication was discontinued on 06/13/2021 by Lindalou Hose.

## 2021-07-03 DIAGNOSIS — N186 End stage renal disease: Secondary | ICD-10-CM | POA: Diagnosis not present

## 2021-07-03 DIAGNOSIS — Z992 Dependence on renal dialysis: Secondary | ICD-10-CM | POA: Diagnosis not present

## 2021-07-03 DIAGNOSIS — N2581 Secondary hyperparathyroidism of renal origin: Secondary | ICD-10-CM | POA: Diagnosis not present

## 2021-07-05 ENCOUNTER — Other Ambulatory Visit: Payer: Self-pay | Admitting: Gastroenterology

## 2021-07-05 DIAGNOSIS — N2581 Secondary hyperparathyroidism of renal origin: Secondary | ICD-10-CM | POA: Diagnosis not present

## 2021-07-05 DIAGNOSIS — Z992 Dependence on renal dialysis: Secondary | ICD-10-CM | POA: Diagnosis not present

## 2021-07-05 DIAGNOSIS — N186 End stage renal disease: Secondary | ICD-10-CM | POA: Diagnosis not present

## 2021-07-08 DIAGNOSIS — Z992 Dependence on renal dialysis: Secondary | ICD-10-CM | POA: Diagnosis not present

## 2021-07-08 DIAGNOSIS — N2581 Secondary hyperparathyroidism of renal origin: Secondary | ICD-10-CM | POA: Diagnosis not present

## 2021-07-08 DIAGNOSIS — N186 End stage renal disease: Secondary | ICD-10-CM | POA: Diagnosis not present

## 2021-07-10 DIAGNOSIS — N2581 Secondary hyperparathyroidism of renal origin: Secondary | ICD-10-CM | POA: Diagnosis not present

## 2021-07-10 DIAGNOSIS — N186 End stage renal disease: Secondary | ICD-10-CM | POA: Diagnosis not present

## 2021-07-10 DIAGNOSIS — Z992 Dependence on renal dialysis: Secondary | ICD-10-CM | POA: Diagnosis not present

## 2021-07-12 DIAGNOSIS — N2581 Secondary hyperparathyroidism of renal origin: Secondary | ICD-10-CM | POA: Diagnosis not present

## 2021-07-12 DIAGNOSIS — Z992 Dependence on renal dialysis: Secondary | ICD-10-CM | POA: Diagnosis not present

## 2021-07-12 DIAGNOSIS — N186 End stage renal disease: Secondary | ICD-10-CM | POA: Diagnosis not present

## 2021-07-15 DIAGNOSIS — N2581 Secondary hyperparathyroidism of renal origin: Secondary | ICD-10-CM | POA: Diagnosis not present

## 2021-07-15 DIAGNOSIS — N186 End stage renal disease: Secondary | ICD-10-CM | POA: Diagnosis not present

## 2021-07-15 DIAGNOSIS — Z992 Dependence on renal dialysis: Secondary | ICD-10-CM | POA: Diagnosis not present

## 2021-07-17 DIAGNOSIS — N2581 Secondary hyperparathyroidism of renal origin: Secondary | ICD-10-CM | POA: Diagnosis not present

## 2021-07-17 DIAGNOSIS — N186 End stage renal disease: Secondary | ICD-10-CM | POA: Diagnosis not present

## 2021-07-17 DIAGNOSIS — Z992 Dependence on renal dialysis: Secondary | ICD-10-CM | POA: Diagnosis not present

## 2021-07-19 ENCOUNTER — Ambulatory Visit
Admission: RE | Admit: 2021-07-19 | Discharge: 2021-07-19 | Disposition: A | Discharge status: Home / Self Care | Payer: BC Managed Care – PPO | Source: Ambulatory Visit | Attending: Gastroenterology | Admitting: Gastroenterology

## 2021-07-19 ENCOUNTER — Other Ambulatory Visit: Payer: Self-pay

## 2021-07-19 DIAGNOSIS — R112 Nausea with vomiting, unspecified: Secondary | ICD-10-CM | POA: Insufficient documentation

## 2021-07-19 MED ORDER — TECHNETIUM TC 99M SULFUR COLLOID
2.0000 | Freq: Once | INTRAVENOUS | Status: AC
  Administered 2021-07-19: 2.38 via ORAL

## 2021-07-22 DIAGNOSIS — Z992 Dependence on renal dialysis: Secondary | ICD-10-CM | POA: Diagnosis not present

## 2021-07-22 DIAGNOSIS — N2581 Secondary hyperparathyroidism of renal origin: Secondary | ICD-10-CM | POA: Diagnosis not present

## 2021-07-22 DIAGNOSIS — N186 End stage renal disease: Secondary | ICD-10-CM | POA: Diagnosis not present

## 2021-07-24 ENCOUNTER — Ambulatory Visit (INDEPENDENT_AMBULATORY_CARE_PROVIDER_SITE_OTHER): Payer: BC Managed Care – PPO | Admitting: Gastroenterology

## 2021-07-24 ENCOUNTER — Encounter: Payer: Self-pay | Admitting: Gastroenterology

## 2021-07-24 VITALS — BP 190/91 | HR 77 | Temp 98.2°F | Wt 161.0 lb

## 2021-07-24 DIAGNOSIS — N186 End stage renal disease: Secondary | ICD-10-CM | POA: Diagnosis not present

## 2021-07-24 DIAGNOSIS — N2581 Secondary hyperparathyroidism of renal origin: Secondary | ICD-10-CM | POA: Diagnosis not present

## 2021-07-24 DIAGNOSIS — K2100 Gastro-esophageal reflux disease with esophagitis, without bleeding: Secondary | ICD-10-CM

## 2021-07-24 DIAGNOSIS — Z992 Dependence on renal dialysis: Secondary | ICD-10-CM | POA: Diagnosis not present

## 2021-07-24 MED ORDER — PANTOPRAZOLE SODIUM 40 MG PO TBEC: DELAYED_RELEASE_TABLET | ORAL | 0 refills | Status: DC

## 2021-07-24 NOTE — Progress Notes (Signed)
Vonda Antigua, MD [address]  [place]  [place], [place] [address]  Main: [phone]  Fax: [phone]   Primary Care Physician: Glean Hess, MD   Chief complaint: Nausea  HPI: Kelli [name] is a 59 y.o. female here for follow-up.  Protonix was started on last visit and patient reports some improvement in symptoms with this.  However, reports having regurgitation and burning sensation in chest when laying down.  Denies any dysphagia.  No weight loss.  No abdominal pain.  Gastric emptying study was normal.  Patient refused colonoscopy previously  Hep C RNA level checked after last visit was undetectable  Previous history: patient previously seen by [facility], note in care everywhere from February 18, 2021 reviewed.  Patient was seen for similar symptoms, and note reports history of recurrent episodes of nausea vomiting, with history of normal gastric emptying study in 2017.  They also state that patient had an endoscopy in 2018 that was essentially normal aside from bilious fluid.  They recommended upper endoscopy, Gaviscon after larger meals before bed to prevent gastric content from being reflux.  She subsequently underwent upper endoscopy on May 22, 2021 that showed LA grade C reflux esophagitis, with biopsies done for EOE.  Normal stomach and duodenum.  Patient was prescribed PPI but she states that she is not taking it.  She was prescribed Prilosec and states this was making her symptoms worse and so she stopped taking it.   Reports symptoms ongoing for at least 2 to 3 years, with frequent nausea and vomiting.  Denies any drug use or marijuana use.  Occurs with or without meals.   Denies any abdominal pain.  Denies postprandial pain.  Has history of CKD and sees transplant clinic at [facility] as well.   Denies any blood in stool.  Colonoscopy was canceled as patient could not tolerate prep.   She had a colonoscopy in 2015.  Procedure report not available.   ROS:  All ROS reviewed and negative except as per HPI   Past Medical History:  Diagnosis Date   Chronic kidney disease    Diabetes mellitus without complication ([place])    Migraine    Staph infection     Past Surgical History:  Procedure Laterality Date   ABDOMINAL HYSTERECTOMY     CAPD INSERTION N/A 05/30/2021   Procedure: CONTINUOUS AMBULATORY PERITONEAL DIALYSIS  (CAPD) CATHETER INSERTION;  Surgeon: Jules Husbands, MD;  Location: [facility] ORS;  Service: General;  Laterality: N/A;   COLONOSCOPY W/ POLYPECTOMY     DIALYSIS/PERMA CATHETER INSERTION N/A 05/13/2021   Procedure: DIALYSIS/PERMA CATHETER INSERTION;  Surgeon: Algernon Huxley, MD;  Location: [facility];  Service: Cardiovascular;  Laterality: N/A;   ESOPHAGOGASTRODUODENOSCOPY  03/02/2017   Normal   HEMORRHOID SURGERY      Prior to Admission medications   Medication Sig Start Date End Date Taking? Authorizing Provider  amLODipine (NORVASC) 5 MG tablet Take 5 mg by mouth daily. 03/07/21  Yes [provider]  BD PEN NEEDLE NANO U/F 32G X 4 MM MISC Inject 1 each into the skin 3 (three) times daily. 09/27/20  Yes [provider]  carvedilol (COREG) 12.5 MG tablet Take 1 tablet (12.5 mg total) by mouth 2 (two) times daily. 04/09/21  Yes Glean Hess, MD  Continuous Blood Gluc Receiver (FREESTYLE LIBRE 72 DAY READER) DEVI See admin instructions. 04/06/20  Yes [provider]  Continuous Blood Gluc Sensor (FREESTYLE LIBRE 2 SENSOR) MISC  USE AS DIRECTED EVERY 14 DAYS 05/07/21  Yes Glean Hess, MD  dorzolamide-timolol (COSOPT) 22.3-6.8 MG/ML ophthalmic solution Place 1 drop into the left eye 2 (two) times daily. 04/30/21  Yes [provider]  escitalopram (LEXAPRO) 5 MG tablet Take 5 mg by mouth daily. 10/12/20  Yes [provider]  glucose blood (FREESTYLE LITE) test strip 1 each by Other route five (5) times a day.   Yes [provider]  insulin glargine (LANTUS SOLOSTAR) 100 UNIT/ML  Solostar Pen Inject 16 Units into the skin daily.   Yes [provider]  insulin lispro (HUMALOG) 100 UNIT/ML injection Inject 0-8 Units into the skin 3 (three) times daily before meals. SSI   Yes [provider]  latanoprost (XALATAN) 0.005 % ophthalmic solution Place 1 drop into the left eye at bedtime. 05/01/21  Yes [provider]  losartan (COZAAR) 100 MG tablet TAKE 1 TABLET BY MOUTH EVERY DAY 07/02/21  Yes Glean Hess, MD  metoCLOPramide (REGLAN) 10 MG tablet Take 10 mg by mouth every 8 (eight) hours as needed for vomiting or nausea. 04/03/21  Yes [provider]  mupirocin ointment (BACTROBAN) 2 % Apply 1 application topically 2 (two) times daily. 07/30/20  Yes Glean Hess, MD  senna (SENOKOT) 8.6 MG tablet Take 1 tablet by mouth daily. 10/25/20  Yes [provider]  sucralfate (CARAFATE) 1 GM/10ML suspension Take 10 mLs (1 g total) by mouth 4 (four) times daily. 06/13/21  Yes Virgel Manifold, MD  pantoprazole (PROTONIX) 40 MG tablet Take 1 tablet (40 mg total) by mouth 2 (two) times daily for 30 days, THEN 1 tablet (40 mg total) daily. 07/24/21 10/22/21  Virgel Manifold, MD    Family History  Problem Relation Age of Onset   Heart failure Mother    Diabetes Mother    Breast cancer Neg Hx      Social History   Tobacco Use   Smoking status: Former   Smokeless tobacco: Never  Vaping Use   Vaping Use: Never used  Substance Use Topics   Alcohol use: No   Drug use: No    Allergies as of 07/24/2021 - Review Complete 07/24/2021  Allergen Reaction Noted   Statins Nausea And Vomiting 09/17/2020   Albumin human Other (See Comments) 04/19/2021   Gabapentin Swelling 09/22/2016   Hydrochlorothiazide Nausea And Vomiting 06/13/2019   Lisinopril Other (See Comments) 08/26/2016   Lyrica [pregabalin] Swelling 09/22/2016   Metformin and related Nausea And Vomiting 09/22/2016   Penicillins Nausea And Vomiting 09/03/2015     Physical Examination:  Constitutional: General:   Alert,  Well-developed, well-nourished, pleasant and cooperative in NAD BP (!) 190/91   Pulse 77   Temp 98.2 F (36.8 C) (Oral)   Wt 161 lb (73 kg)   BMI 25.22 kg/m   Respiratory: Normal respiratory effort  Gastrointestinal:  Soft, non-tender and non-distended without masses, hepatosplenomegaly or hernias noted.  No guarding or rebound tenderness.     Cardiac: No clubbing or edema.  No cyanosis. Normal posterior tibial pedal pulses noted.  Psych:  Alert and cooperative. Normal mood and affect.  Musculoskeletal:  Normal gait. Head normocephalic, atraumatic. Symmetrical without gross deformities. 5/5 Lower extremity strength bilaterally.  Skin: Warm. Intact without significant lesions or rashes. No jaundice.  Neck: Supple, trachea midline  Lymph: No cervical lymphadenopathy  Psych:  Alert and oriented x3, Alert and cooperative. Normal mood and affect.  Labs: CMP     Component Value Date/Time  NA 133 (L) 05/31/2021 0417   NA 140 04/10/2021 1613   K 3.6 05/31/2021 0417   CL 99 05/31/2021 0417   CO2 27 05/31/2021 0417   GLUCOSE 211 (H) 05/31/2021 0417   BUN 23 05/31/2021 0417   BUN 40 (H) 04/10/2021 1613   CREATININE 3.63 (H) 05/31/2021 0417   CALCIUM 7.9 (L) 05/31/2021 0417   PROT 6.0 (L) 05/26/2021 1029   ALBUMIN 3.4 (L) 05/26/2021 1029   AST 19 05/26/2021 1029   ALT 11 05/26/2021 1029   ALKPHOS 55 05/26/2021 1029   BILITOT 0.9 05/26/2021 1029   GFRNONAA 14 (L) 05/31/2021 0417   GFRAA 38 (L) 07/18/2019 1427   Lab Results  Component Value Date   WBC 10.7 (H) 05/31/2021   HGB 7.6 (L) 05/31/2021   HCT 22.5 (L) 05/31/2021   MCV 87.5 05/31/2021   PLT 213 05/31/2021    Imaging Studies:   Assessment and Plan:   Kelli [name] is a 60 y.o. y/o female here for follow-up and reports improvement in symptoms of nausea and reflux with starting Protonix after last visit but continues to have reflux symptoms  specially when laying down at night  Given nighttime symptoms, will increase PPI to twice daily for 30 days and then decrease it again to once a day  (Risks of PPI use were discussed with patient including bone loss, C. Diff diarrhea, pneumonia, infections, CKD, electrolyte abnormalities.  Pt. Verbalizes understanding and chooses to continue the medication.)  Gastric emptying study was normal  Hep C RNA quant undetectable  Patient refuses colonoscopy  Follow-up clinic visit offered to reassess symptoms, but patient does not want to make a clinic visit at this time.  Therefore, recall was set in the chart instead  Repeat EGD given Grade C esophagitis in Sep 2022  Patient educated extensively on acid reflux lifestyle modification, including using a bed wedge, not eating 3 hrs before bedtime, diet modifications, and handout given for the same.    Dr Vonda Antigua

## 2021-07-24 NOTE — Patient Instructions (Signed)
We will fax referral to   [facility] Referral [address], [place], [place] [address] PHONE [phone]

## 2021-07-25 ENCOUNTER — Telehealth: Payer: Self-pay

## 2021-07-25 NOTE — Telephone Encounter (Signed)
Dr Bonna Gains would like to see if pt would be okay with having EGD prior to pH study at [facility] as her previous showed inflammation of esophagus

## 2021-07-26 ENCOUNTER — Telehealth: Payer: Self-pay

## 2021-07-26 DIAGNOSIS — N186 End stage renal disease: Secondary | ICD-10-CM | POA: Diagnosis not present

## 2021-07-26 DIAGNOSIS — Z992 Dependence on renal dialysis: Secondary | ICD-10-CM | POA: Diagnosis not present

## 2021-07-26 DIAGNOSIS — N2581 Secondary hyperparathyroidism of renal origin: Secondary | ICD-10-CM | POA: Diagnosis not present

## 2021-07-26 NOTE — Telephone Encounter (Signed)
Copied from [place] [phone]. Topic: General - Other >> Jul 26, 2021 10:35 AM Valere Dross wrote: Reason for CRM: pt called in wanting to speak with PCP nurse about a referral they had talked to her about, or seeing if she can do a sleep study, pt requested a call back, please advise.

## 2021-07-29 DIAGNOSIS — N2581 Secondary hyperparathyroidism of renal origin: Secondary | ICD-10-CM | POA: Diagnosis not present

## 2021-07-29 DIAGNOSIS — Z992 Dependence on renal dialysis: Secondary | ICD-10-CM | POA: Diagnosis not present

## 2021-07-29 DIAGNOSIS — N186 End stage renal disease: Secondary | ICD-10-CM | POA: Diagnosis not present

## 2021-07-29 NOTE — Telephone Encounter (Signed)
Called patient and left VM asking her to call back and schedule an in person visit to discuss a sleep study referral. Told her we have to see her in person, and document before we can send these kind of referrals.

## 2021-07-30 DIAGNOSIS — N186 End stage renal disease: Secondary | ICD-10-CM | POA: Diagnosis not present

## 2021-07-30 DIAGNOSIS — Z992 Dependence on renal dialysis: Secondary | ICD-10-CM | POA: Diagnosis not present

## 2021-07-30 DIAGNOSIS — N2581 Secondary hyperparathyroidism of renal origin: Secondary | ICD-10-CM | POA: Diagnosis not present

## 2021-07-31 DIAGNOSIS — Z992 Dependence on renal dialysis: Secondary | ICD-10-CM | POA: Diagnosis not present

## 2021-07-31 DIAGNOSIS — N186 End stage renal disease: Secondary | ICD-10-CM | POA: Diagnosis not present

## 2021-07-31 DIAGNOSIS — N2581 Secondary hyperparathyroidism of renal origin: Secondary | ICD-10-CM | POA: Diagnosis not present

## 2021-08-01 DIAGNOSIS — N2581 Secondary hyperparathyroidism of renal origin: Secondary | ICD-10-CM | POA: Diagnosis not present

## 2021-08-01 DIAGNOSIS — N186 End stage renal disease: Secondary | ICD-10-CM | POA: Diagnosis not present

## 2021-08-01 DIAGNOSIS — Z992 Dependence on renal dialysis: Secondary | ICD-10-CM | POA: Diagnosis not present

## 2021-08-02 DIAGNOSIS — N2581 Secondary hyperparathyroidism of renal origin: Secondary | ICD-10-CM | POA: Diagnosis not present

## 2021-08-02 DIAGNOSIS — N186 End stage renal disease: Secondary | ICD-10-CM | POA: Diagnosis not present

## 2021-08-02 DIAGNOSIS — Z992 Dependence on renal dialysis: Secondary | ICD-10-CM | POA: Diagnosis not present

## 2021-08-05 DIAGNOSIS — N186 End stage renal disease: Secondary | ICD-10-CM | POA: Diagnosis not present

## 2021-08-05 DIAGNOSIS — Z992 Dependence on renal dialysis: Secondary | ICD-10-CM | POA: Diagnosis not present

## 2021-08-05 DIAGNOSIS — N2581 Secondary hyperparathyroidism of renal origin: Secondary | ICD-10-CM | POA: Diagnosis not present

## 2021-08-06 ENCOUNTER — Other Ambulatory Visit: Payer: Self-pay

## 2021-08-06 ENCOUNTER — Encounter: Payer: Self-pay | Admitting: Internal Medicine

## 2021-08-06 ENCOUNTER — Ambulatory Visit (INDEPENDENT_AMBULATORY_CARE_PROVIDER_SITE_OTHER): Payer: BC Managed Care – PPO | Admitting: Internal Medicine

## 2021-08-06 VITALS — BP 132/82 | HR 75 | Ht 67.0 in | Wt 162.0 lb

## 2021-08-06 DIAGNOSIS — K279 Peptic ulcer, site unspecified, unspecified as acute or chronic, without hemorrhage or perforation: Secondary | ICD-10-CM | POA: Diagnosis not present

## 2021-08-06 DIAGNOSIS — N186 End stage renal disease: Secondary | ICD-10-CM | POA: Insufficient documentation

## 2021-08-06 DIAGNOSIS — N2581 Secondary hyperparathyroidism of renal origin: Secondary | ICD-10-CM | POA: Diagnosis not present

## 2021-08-06 DIAGNOSIS — D1779 Benign lipomatous neoplasm of other sites: Secondary | ICD-10-CM | POA: Diagnosis not present

## 2021-08-06 DIAGNOSIS — Z992 Dependence on renal dialysis: Secondary | ICD-10-CM

## 2021-08-06 DIAGNOSIS — G4730 Sleep apnea, unspecified: Secondary | ICD-10-CM | POA: Diagnosis not present

## 2021-08-06 NOTE — Progress Notes (Signed)
Date:  08/06/2021   Name:  LENNOX LEIKAM   DOB:  1960-11-20   MRN:  [id number]   Chief Complaint: Insomnia  Insomnia Primary symptoms: fragmented sleep, sleep disturbance.   The onset quality is gradual. Typical bedtime:  8-10 P.M..  Duration of naps:  One to two hours.   Hypertension This is a chronic problem. The problem is controlled. Pertinent negatives include no chest pain, headaches or palpitations. Past treatments include beta blockers. The current treatment provides significant improvement. Hypertensive end-organ damage includes kidney disease (ESRD on HD and PD).  Gastroesophageal Reflux She complains of coughing, dysphagia, heartburn, nausea and water brash. She reports no chest pain or no wheezing. and recurrent vomiting. The problem occurs frequently. Pertinent negatives include no fatigue. She has tried a PPI for the symptoms. Past procedures include an EGD (showed gastric ulcers -).   Her husband has witnessed pt snoring. Patient says she wakes up gasping for air.   Results of the Epworth flowsheet 08/06/2021  Sitting and reading 1  Watching TV 1  Sitting, inactive in a public place (e.g. a theatre or a meeting) 0  As a passenger in a car for an hour without a break 0  Lying down to rest in the afternoon when circumstances permit 0  Sitting and talking to someone 0  Sitting quietly after a lunch without alcohol 0  In a car, while stopped for a few minutes in traffic 0  Total score 2     Lab Results  Component Value Date   NA 133 (L) 05/31/2021   K 3.6 05/31/2021   CO2 27 05/31/2021   GLUCOSE 211 (H) 05/31/2021   BUN 23 05/31/2021   CREATININE 3.63 (H) 05/31/2021   CALCIUM 7.9 (L) 05/31/2021   EGFR 12 (L) 04/10/2021   GFRNONAA 14 (L) 05/31/2021   Lab Results  Component Value Date   CHOL 259 (H) 04/10/2021   HDL 62 04/10/2021   LDLCALC 168 (H) 04/10/2021   TRIG 162 (H) 04/10/2021   CHOLHDL 4.2 04/10/2021   Lab Results  Component Value Date   TSH  0.619 05/26/2021   Lab Results  Component Value Date   HGBA1C 7.5 (H) 05/26/2021   Lab Results  Component Value Date   WBC 10.7 (H) 05/31/2021   HGB 7.6 (L) 05/31/2021   HCT 22.5 (L) 05/31/2021   MCV 87.5 05/31/2021   PLT 213 05/31/2021   Lab Results  Component Value Date   ALT 11 05/26/2021   AST 19 05/26/2021   ALKPHOS 55 05/26/2021   BILITOT 0.9 05/26/2021   No results found for: 25OHVITD2, 25OHVITD3, VD25OH   Review of Systems  Constitutional:  Positive for unexpected weight change. Negative for chills, fatigue and fever.  HENT:  Negative for trouble swallowing.   Respiratory:  Positive for cough. Negative for chest tightness and wheezing.   Cardiovascular:  Negative for chest pain, palpitations and leg swelling.  Gastrointestinal:  Positive for dysphagia, heartburn, nausea and vomiting.  Musculoskeletal:  Negative for arthralgias.  Neurological:  Negative for dizziness and headaches.  Psychiatric/Behavioral:  Positive for sleep disturbance. Negative for dysphoric mood. The patient has insomnia. The patient is not nervous/anxious.    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, left eye, 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   Neovascular glaucoma of left eye, 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   Mixed hyperlipidemia 06/30/2019   Hypomagnesemia 06/30/2019   Microalbuminuria 09/24/2018   Neuropathy of left foot 03/18/2017   Arthrosis of midfoot, left 03/18/2017   Charcot foot due to diabetes mellitus  ([place]) 02/09/2017   Venous insufficiency of both lower extremities 02/09/2017   Chronic idiopathic constipation 01/26/2017   Intractable vomiting with nausea 01/26/2017   Abnormal thyroid blood test 09/30/2016   Essential hypertension 08/21/2016   Adrenal mass ([place]) 08/18/2016   Temporomandibular joint pain 11/02/2014   Moderate episode of recurrent major depressive disorder ([place]) 11/02/2014   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[place]) 06/28/2014   Eustachian tube dysfunction 05/10/2014   Hyperlipidemia 10/03/2013   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   Vitamin D deficiency 12/30/2012   Corns and callosity 07/09/2012   Hammer toe, acquired 07/09/2012    Allergies  Allergen Reactions   Statins Nausea And Vomiting   Albumin Human Other (See Comments)    Refuses all blood products as one of Jehovah's Witnesses Refuses all blood products as one of Jehovah's Witnesses    Gabapentin Swelling   Hydrochlorothiazide Nausea And Vomiting   Lisinopril Other (See Comments)    fatigue   Lyrica [Pregabalin] Swelling   Metformin And Related Nausea And Vomiting   Penicillins Nausea And Vomiting    Past Surgical History:  Procedure Laterality Date   ABDOMINAL HYSTERECTOMY     CAPD INSERTION N/A 05/30/2021   Procedure: CONTINUOUS AMBULATORY PERITONEAL DIALYSIS  (CAPD) CATHETER INSERTION;  Surgeon: Jules Husbands, MD;  Location: [facility] ORS;  Service: General;  Laterality: N/A;   COLONOSCOPY W/ POLYPECTOMY     DIALYSIS/PERMA CATHETER INSERTION N/A 05/13/2021   Procedure: DIALYSIS/PERMA CATHETER INSERTION;  Surgeon: Algernon Huxley, MD;  Location: [place] [facility];  Service: Cardiovascular;  Laterality: N/A;   ESOPHAGOGASTRODUODENOSCOPY  03/02/2017   Normal   HEMORRHOID SURGERY      Social History   Tobacco Use   Smoking status: Former   Smokeless tobacco: Never  Vaping Use   Vaping Use: Never used  Substance Use Topics   Alcohol use: No   Drug use: No      Medication list has been reviewed and updated.  Current Meds  Medication Sig   BD PEN NEEDLE NANO U/F 32G X 4 MM MISC Inject 1 each into the skin 3 (three) times daily.   carvedilol (COREG) 12.5 MG tablet Take 1 tablet (12.5 mg total) by mouth 2 (two) times daily.   Continuous Blood Gluc Receiver (FREESTYLE LIBRE 14 DAY READER) DEVI See admin instructions.   Continuous Blood Gluc Sensor (FREESTYLE LIBRE 2 SENSOR) MISC USE AS DIRECTED EVERY 14 DAYS   dorzolamide-timolol (COSOPT) 22.3-6.8 MG/ML ophthalmic solution Place 1 drop into the left eye 2 (two) times daily.   escitalopram (LEXAPRO) 5 MG tablet Take 5 mg by mouth daily.   glucose blood (FREESTYLE LITE) test strip 1 each by Other route five (5) times a day.   insulin glargine (LANTUS SOLOSTAR) 100 UNIT/ML Solostar Pen Inject 16 Units into the skin daily.   insulin lispro (HUMALOG) 100 UNIT/ML injection Inject 0-8 Units into the skin 3 (  three) times daily before meals. SSI   latanoprost (XALATAN) 0.005 % ophthalmic solution Place 1 drop into the left eye at bedtime.   mupirocin ointment (BACTROBAN) 2 % Apply 1 application topically 2 (two) times daily.   pantoprazole (PROTONIX) 40 MG tablet Take 1 tablet (40 mg total) by mouth 2 (two) times daily for 30 days, THEN 1 tablet (40 mg total) daily.   sucralfate (CARAFATE) 1 GM/10ML suspension Take 10 mLs (1 g total) by mouth 4 (four) times daily.    PHQ 2/9 Scores 08/06/2021 04/10/2021 02/15/2021 01/28/2021  PHQ - 2 Score 0 1 0 6  PHQ- 9 Score 0 4 0 23    GAD 7 : Generalized Anxiety Score 08/06/2021 04/10/2021 02/15/2021 01/28/2021  Nervous, Anxious, on Edge 0 0 0 3  Control/stop worrying 0 0 0 1  Worry too much - different things 0 0 0 0  Trouble relaxing 0 0 0 3  Restless 0 0 0 0  Easily annoyed or irritable 0 0 0 0  Afraid - awful might happen 0 0 0 0  Total GAD 7 Score 0 0 0 7  Anxiety Difficulty Not difficult at all Not difficult at all - Very difficult    BP Readings from Last  3 Encounters:  08/06/21 132/82  07/24/21 (!) 190/91  06/13/21 (!) 200/90    Physical Exam Vitals and nursing note reviewed.  Constitutional:      General: She is not in acute distress.    Appearance: Normal appearance. She is well-developed.  HENT:     Head: Normocephalic and atraumatic.  Cardiovascular:     Rate and Rhythm: Normal rate and regular rhythm.  Pulmonary:     Effort: Pulmonary effort is normal. No respiratory distress.     Breath sounds: No wheezing or rhonchi.  Chest:       Comments: Perma-cath for HD Musculoskeletal:     Cervical back: Normal range of motion.  Lymphadenopathy:     Cervical: No cervical adenopathy.  Skin:    General: Skin is warm and dry.     Findings: No rash.  Neurological:     Mental Status: She is alert and oriented to person, place, and time.  Psychiatric:        Mood and Affect: Mood normal.        Behavior: Behavior normal.    Wt Readings from Last 3 Encounters:  08/06/21 162 lb (73.5 kg)  07/24/21 161 lb (73 kg)  06/13/21 163 lb 12.8 oz (74.3 kg)    BP 132/82    Pulse 75    Ht 5' 7"  (1.702 m)    Wt 162 lb (73.5 kg)    SpO2 96%    BMI 25.37 kg/m Neck circumference: 15 in.  Assessment and Plan: 1. Sleep disorder breathing Will order home sleep study with [facility]  2. Peptic ulcer disease Found on recent EGD Continue PPI bid - no follow up scheduled Likely contributing to N/V -   3. ESRD on peritoneal dialysis [facility]) On HD until she is trained in PD So far she is tolerating this well.  4. 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 Any errors are unintentional.  Halina Maidens, MD [facility]  08/06/2021

## 2021-08-07 DIAGNOSIS — Z992 Dependence on renal dialysis: Secondary | ICD-10-CM | POA: Diagnosis not present

## 2021-08-07 DIAGNOSIS — N186 End stage renal disease: Secondary | ICD-10-CM | POA: Diagnosis not present

## 2021-08-07 DIAGNOSIS — N2581 Secondary hyperparathyroidism of renal origin: Secondary | ICD-10-CM | POA: Diagnosis not present

## 2021-08-08 DIAGNOSIS — H4052X4 Glaucoma secondary to other eye disorders, left eye, indeterminate stage: Secondary | ICD-10-CM | POA: Diagnosis not present

## 2021-08-08 DIAGNOSIS — N186 End stage renal disease: Secondary | ICD-10-CM | POA: Diagnosis not present

## 2021-08-08 DIAGNOSIS — N2581 Secondary hyperparathyroidism of renal origin: Secondary | ICD-10-CM | POA: Diagnosis not present

## 2021-08-08 DIAGNOSIS — Z992 Dependence on renal dialysis: Secondary | ICD-10-CM | POA: Diagnosis not present

## 2021-08-09 DIAGNOSIS — N2581 Secondary hyperparathyroidism of renal origin: Secondary | ICD-10-CM | POA: Diagnosis not present

## 2021-08-09 DIAGNOSIS — N186 End stage renal disease: Secondary | ICD-10-CM | POA: Diagnosis not present

## 2021-08-09 DIAGNOSIS — Z992 Dependence on renal dialysis: Secondary | ICD-10-CM | POA: Diagnosis not present

## 2021-08-10 DIAGNOSIS — Z992 Dependence on renal dialysis: Secondary | ICD-10-CM | POA: Diagnosis not present

## 2021-08-10 DIAGNOSIS — N2581 Secondary hyperparathyroidism of renal origin: Secondary | ICD-10-CM | POA: Diagnosis not present

## 2021-08-10 DIAGNOSIS — N186 End stage renal disease: Secondary | ICD-10-CM | POA: Diagnosis not present

## 2021-08-12 DIAGNOSIS — N186 End stage renal disease: Secondary | ICD-10-CM | POA: Diagnosis not present

## 2021-08-12 DIAGNOSIS — N2581 Secondary hyperparathyroidism of renal origin: Secondary | ICD-10-CM | POA: Diagnosis not present

## 2021-08-12 DIAGNOSIS — Z992 Dependence on renal dialysis: Secondary | ICD-10-CM | POA: Diagnosis not present

## 2021-08-13 DIAGNOSIS — N186 End stage renal disease: Secondary | ICD-10-CM | POA: Diagnosis not present

## 2021-08-13 DIAGNOSIS — N2581 Secondary hyperparathyroidism of renal origin: Secondary | ICD-10-CM | POA: Diagnosis not present

## 2021-08-13 DIAGNOSIS — Z992 Dependence on renal dialysis: Secondary | ICD-10-CM | POA: Diagnosis not present

## 2021-08-14 DIAGNOSIS — Z992 Dependence on renal dialysis: Secondary | ICD-10-CM | POA: Diagnosis not present

## 2021-08-14 DIAGNOSIS — N186 End stage renal disease: Secondary | ICD-10-CM | POA: Diagnosis not present

## 2021-08-14 DIAGNOSIS — N2581 Secondary hyperparathyroidism of renal origin: Secondary | ICD-10-CM | POA: Diagnosis not present

## 2021-08-15 DIAGNOSIS — Z992 Dependence on renal dialysis: Secondary | ICD-10-CM | POA: Diagnosis not present

## 2021-08-15 DIAGNOSIS — N2581 Secondary hyperparathyroidism of renal origin: Secondary | ICD-10-CM | POA: Diagnosis not present

## 2021-08-15 DIAGNOSIS — N186 End stage renal disease: Secondary | ICD-10-CM | POA: Diagnosis not present

## 2021-08-16 ENCOUNTER — Telehealth: Payer: Self-pay | Admitting: Gastroenterology

## 2021-08-16 DIAGNOSIS — N2581 Secondary hyperparathyroidism of renal origin: Secondary | ICD-10-CM | POA: Diagnosis not present

## 2021-08-16 DIAGNOSIS — N186 End stage renal disease: Secondary | ICD-10-CM | POA: Diagnosis not present

## 2021-08-16 DIAGNOSIS — Z992 Dependence on renal dialysis: Secondary | ICD-10-CM | POA: Diagnosis not present

## 2021-08-16 NOTE — Telephone Encounter (Signed)
Left message on voicemail.

## 2021-08-16 NOTE — Telephone Encounter (Signed)
Inbound call from pt requesting a call back stating that the pantoprazole is making her more sick than what she was. Please advise. Thank you.

## 2021-08-19 DIAGNOSIS — Z992 Dependence on renal dialysis: Secondary | ICD-10-CM | POA: Diagnosis not present

## 2021-08-19 DIAGNOSIS — N186 End stage renal disease: Secondary | ICD-10-CM | POA: Diagnosis not present

## 2021-08-19 DIAGNOSIS — N2581 Secondary hyperparathyroidism of renal origin: Secondary | ICD-10-CM | POA: Diagnosis not present

## 2021-08-20 NOTE — Telephone Encounter (Signed)
Pt has f/u appt scheduled for tomorrow 12/28 at 2:15pm

## 2021-08-20 NOTE — Telephone Encounter (Signed)
Inbound from pt's husband requesting a call back stating that pt still isn't feeling well. Please advise. Thank you.

## 2021-08-21 ENCOUNTER — Other Ambulatory Visit: Payer: Self-pay | Admitting: Gastroenterology

## 2021-08-21 ENCOUNTER — Encounter: Payer: Self-pay | Admitting: Gastroenterology

## 2021-08-21 ENCOUNTER — Ambulatory Visit (INDEPENDENT_AMBULATORY_CARE_PROVIDER_SITE_OTHER): Payer: BC Managed Care – PPO | Admitting: Gastroenterology

## 2021-08-21 VITALS — BP 189/81 | HR 74 | Temp 98.0°F | Wt 154.0 lb

## 2021-08-21 DIAGNOSIS — R197 Diarrhea, unspecified: Secondary | ICD-10-CM | POA: Diagnosis not present

## 2021-08-21 DIAGNOSIS — K2100 Gastro-esophageal reflux disease with esophagitis, without bleeding: Secondary | ICD-10-CM

## 2021-08-21 MED ORDER — FAMOTIDINE 20 MG PO TABS: 20.0000 mg | ORAL_TABLET | Freq: Every evening | ORAL | 1 refills | Status: DC

## 2021-08-21 MED ORDER — DEXLANSOPRAZOLE 30 MG PO CPDR: 30.0000 mg | DELAYED_RELEASE_CAPSULE | Freq: Every day | ORAL | 1 refills | Status: DC

## 2021-08-21 MED ORDER — PEG 3350-KCL-NABCB-NACL-NASULF 236 G PO SOLR: 4000.0000 mL | Freq: Once | ORAL | 0 refills | Status: AC

## 2021-08-21 NOTE — Progress Notes (Signed)
Kelli Antigua, MD [address]  Kelli [name]  [place], K[name] [address]  Main: [phone]  Fax: [phone]   Primary Care Physician: Glean Hess, MD   Chief Complaint  Patient presents with   Follow-up    Pt reports no improvement in Sx, pt states she continues to vomit, most recent was this morning....     HPI: Kelli [name] is a 60 y.o. female here for follow-up of chronic reflux, chronic nausea vomiting.  Patient was advised to have repeat EGD on last visit but did not schedule this.  Colonoscopy was also discussed previously and she had refused before  Patient reports ongoing reflux symptoms/indigestion despite PPI twice daily.  Sucralfate was given on last visit and she states she took it for a week and it did help.  Prior to Protonix she was on omeprazole and reported that this made her symptoms worse and therefore she was given Protonix instead  Is reporting that she is having bowel movements after every 2 to 3 days but on the day that she does have bowel movements they are loose, but no blood in stool.  Will have 4 loose bowel movements in 1 day.  Gastric emptying study was normal  Previous history: patient previously seen by [facility], note in care everywhere from February 18, 2021 reviewed.  Patient was seen for similar symptoms, and note reports history of recurrent episodes of nausea vomiting, with history of normal gastric emptying study in 2017.  They also state that patient had an endoscopy in 2018 that was essentially normal aside from bilious fluid.  They recommended upper endoscopy, Gaviscon after larger meals before bed to prevent gastric content from being reflux.  She subsequently underwent upper endoscopy on May 22, 2021 that showed LA grade C reflux esophagitis, with biopsies done for EOE.  Normal stomach and duodenum.  Patient was prescribed PPI but she states that she is not taking it.  She was prescribed Prilosec and states this was making her  symptoms worse and so she stopped taking it.   Reports symptoms ongoing for at least 2 to 3 years, with frequent nausea and vomiting.  Denies any drug use or marijuana use.  Occurs with or without meals.   Denies any abdominal pain.  Denies postprandial pain.  Has history of CKD and sees transplant clinic at [facility] as well.   Denies any blood in stool.  Colonoscopy was canceled as patient could not tolerate prep.   She had a colonoscopy in 2015.  Procedure report not available.  ROS: All ROS reviewed and negative except as per HPI   Past Medical History:  Diagnosis Date   Chronic kidney disease    Diabetes mellitus without complication ([place])    Migraine    Staph infection     Past Surgical History:  Procedure Laterality Date   ABDOMINAL HYSTERECTOMY     CAPD INSERTION N/A 05/30/2021   Procedure: CONTINUOUS AMBULATORY PERITONEAL DIALYSIS  (CAPD) CATHETER INSERTION;  Surgeon: Jules Husbands, MD;  Location: [facility] ORS;  Service: General;  Laterality: N/A;   COLONOSCOPY W/ POLYPECTOMY     DIALYSIS/PERMA CATHETER INSERTION N/A 05/13/2021   Procedure: DIALYSIS/PERMA CATHETER INSERTION;  Surgeon: Algernon Huxley, MD;  Location: [place] [facility];  Service: Cardiovascular;  Laterality: N/A;   ESOPHAGOGASTRODUODENOSCOPY  03/02/2017   Normal   HEMORRHOID SURGERY      Prior to Admission medications   Medication Sig Start Date End Date Taking? Authorizing Provider  BD PEN NEEDLE NANO U/F 32G X 4 MM MISC Inject 1 each into the skin 3 (three) times daily. 09/27/20  Yes [provider]  carvedilol (COREG) 12.5 MG tablet Take 1 tablet (12.5 mg total) by mouth 2 (two) times daily. 04/09/21  Yes Glean Hess, MD  Continuous Blood Gluc Receiver (FREESTYLE LIBRE 25 DAY READER) DEVI See admin instructions. 04/06/20  Yes [provider]  Continuous Blood Gluc Sensor (FREESTYLE LIBRE 2 SENSOR) MISC USE AS DIRECTED EVERY 14 DAYS 05/07/21  Yes Glean Hess, MD  Dexlansoprazole  (DEXILANT) 30 MG capsule DR Take 1 capsule (30 mg total) by mouth daily. 08/21/21 10/20/21 Yes Virgel Manifold, MD  dorzolamide-timolol (COSOPT) 22.3-6.8 MG/ML ophthalmic solution Place 1 drop into the left eye 2 (two) times daily. 04/30/21  Yes [provider]  escitalopram (LEXAPRO) 5 MG tablet Take 5 mg by mouth daily. 10/12/20  Yes [provider]  famotidine (PEPCID) 20 MG tablet Take 1 tablet (20 mg total) by mouth every evening. 08/21/21 10/20/21 Yes Mykah Shin, Margretta Sidle B, MD  glucose blood (FREESTYLE LITE) test strip 1 each by Other route five (5) times a day.   Yes [provider]  insulin glargine (LANTUS SOLOSTAR) 100 UNIT/ML Solostar Pen Inject 16 Units into the skin daily.   Yes [provider]  insulin lispro (HUMALOG) 100 UNIT/ML injection Inject 0-8 Units into the skin 3 (three) times daily before meals. SSI   Yes [provider]  latanoprost (XALATAN) 0.005 % ophthalmic solution Place 1 drop into the left eye at bedtime. 05/01/21  Yes [provider]  mupirocin ointment (BACTROBAN) 2 % Apply 1 application topically 2 (two) times daily. 07/30/20  Yes Glean Hess, MD  sucralfate (CARAFATE) 1 GM/10ML suspension Take 10 mLs (1 g total) by mouth 4 (four) times daily. 06/13/21  Yes [name] B, MD  polyethylene glycol (GOLYTELY) 236 g solution Take 4,000 mLs by mouth once for 1 dose. 08/21/21 08/21/21  Jonathon Bellows, MD    Family History  Problem Relation Age of Onset   Heart failure Mother    Diabetes Mother    Breast cancer Neg Hx      Social History   Tobacco Use   Smoking status: Former   Smokeless tobacco: Never  Vaping Use   Vaping Use: Never used  Substance Use Topics   Alcohol use: No   Drug use: No    Allergies as of 08/21/2021 - Review Complete 08/21/2021  Allergen Reaction Noted   Statins Nausea And Vomiting 09/17/2020   Albumin human Other (See Comments) 04/19/2021   Gabapentin Swelling  09/22/2016   Hydrochlorothiazide Nausea And Vomiting 06/13/2019   Lisinopril Other (See Comments) 08/26/2016   Lyrica [pregabalin] Swelling 09/22/2016   Metformin and related Nausea And Vomiting 09/22/2016   Penicillins Nausea And Vomiting 09/03/2015    Physical Examination:  Constitutional: General:   Alert,  Well-developed, well-nourished, pleasant and cooperative in NAD BP (!) 189/81    Pulse 74    Temp 98 F (36.7 C) (Oral)    Wt 154 lb (69.9 kg)    BMI 24.12 kg/m   Respiratory: Normal respiratory effort  Gastrointestinal:  Soft, non-tender and non-distended without masses, hepatosplenomegaly or hernias noted.  No guarding or rebound tenderness.     Cardiac: No clubbing or edema.  No cyanosis. Normal posterior tibial pedal pulses noted.  Psych:  Alert and cooperative. Normal mood and affect.  Musculoskeletal:  Normal gait. Head normocephalic, atraumatic. Symmetrical  without gross deformities. 5/5 Lower extremity strength bilaterally.  Skin: Warm. Intact without significant lesions or rashes. No jaundice.  Neck: Supple, trachea midline  Lymph: No cervical lymphadenopathy  Psych:  Alert and oriented x3, Alert and cooperative. Normal mood and affect.  Labs: CMP     Component Value Date/Time   NA 133 (L) 05/31/2021 0417   NA 140 04/10/2021 1613   K 3.6 05/31/2021 0417   CL 99 05/31/2021 0417   CO2 27 05/31/2021 0417   GLUCOSE 211 (H) 05/31/2021 0417   BUN 23 05/31/2021 0417   BUN 40 (H) 04/10/2021 1613   CREATININE 3.63 (H) 05/31/2021 0417   CALCIUM 7.9 (L) 05/31/2021 0417   PROT 6.0 (L) 05/26/2021 1029   ALBUMIN 3.4 (L) 05/26/2021 1029   AST 19 05/26/2021 1029   ALT 11 05/26/2021 1029   ALKPHOS 55 05/26/2021 1029   BILITOT 0.9 05/26/2021 1029   GFRNONAA 14 (L) 05/31/2021 0417   GFRAA 38 (L) 07/18/2019 1427   Lab Results  Component Value Date   WBC 10.7 (H) 05/31/2021   HGB 7.6 (L) 05/31/2021   HCT 22.5 (L) 05/31/2021   MCV 87.5 05/31/2021   PLT 213  05/31/2021    Imaging Studies:   Assessment and Plan:   Kelli [name] is a 61 y.o. y/o female here for follow-up of reflux, chronic nausea or vomiting, reporting diarrhea every 2 to 3 days, grade C esophagitis seen on upper endoscopy in September 2022  Unclear why patient did not schedule her EGD after last visit but it was discussed and recommended EGD discussed again and patient willing to schedule  Since Protonix is not helping her, patient is asking for change in medications.  She has tried omeprazole previously and felt that it made her symptoms worse.  We will start Dexilant in the morning, and Pepcid in the evening  Colonoscopy discussed due to her diarrhea to evaluate for microscopic colitis and since patient has not had a colonoscopy since 2015.  Patient is agreeable to this as well  Fecal elastase test at this time as well  Normocytic anemia noted on labs.  Patient has a history of ESRD and is on hemodialysis.  Ferritin normal at 85, with normal serum iron at 40 in October 2022.  Continue follow-up with nephrology and PCP in this regard  (Risks of PPI use were discussed with patient including bone loss, C. Diff diarrhea, pneumonia, infections, CKD, electrolyte abnormalities.  Pt. Verbalizes understanding and chooses to continue the medication.)  If EGD is unrevealing, consider pH study and this was discussed with the patient as well  I have discussed alternative options, risks & benefits,  which include, but are not limited to, bleeding, infection, perforation,respiratory complication & drug reaction.  The patient agrees with this plan & written consent will be obtained.     Dr Kelli [name]

## 2021-08-22 DIAGNOSIS — N2581 Secondary hyperparathyroidism of renal origin: Secondary | ICD-10-CM | POA: Diagnosis not present

## 2021-08-22 DIAGNOSIS — Z992 Dependence on renal dialysis: Secondary | ICD-10-CM | POA: Diagnosis not present

## 2021-08-22 DIAGNOSIS — N186 End stage renal disease: Secondary | ICD-10-CM | POA: Diagnosis not present

## 2021-08-24 ENCOUNTER — Other Ambulatory Visit: Payer: Self-pay | Admitting: Gastroenterology

## 2021-08-24 DIAGNOSIS — N186 End stage renal disease: Secondary | ICD-10-CM | POA: Diagnosis not present

## 2021-08-24 DIAGNOSIS — N2581 Secondary hyperparathyroidism of renal origin: Secondary | ICD-10-CM | POA: Diagnosis not present

## 2021-08-24 DIAGNOSIS — Z992 Dependence on renal dialysis: Secondary | ICD-10-CM | POA: Diagnosis not present

## 2021-08-27 DIAGNOSIS — N2581 Secondary hyperparathyroidism of renal origin: Secondary | ICD-10-CM | POA: Diagnosis not present

## 2021-08-27 DIAGNOSIS — N186 End stage renal disease: Secondary | ICD-10-CM | POA: Diagnosis not present

## 2021-08-27 DIAGNOSIS — Z992 Dependence on renal dialysis: Secondary | ICD-10-CM | POA: Diagnosis not present

## 2021-08-28 ENCOUNTER — Other Ambulatory Visit: Payer: Self-pay | Admitting: Internal Medicine

## 2021-08-28 NOTE — Telephone Encounter (Signed)
Rx written 05/07/2021 #6 with 3 refills. Whic is a 48 week supply. Requested Prescriptions  Pending Prescriptions Disp Refills   Continuous Blood Gluc Sensor (FREESTYLE LIBRE 2 SENSOR) MISC [Pharmacy Med Name: FREESTYLE LIBRE 2 SENSOR]  3    Sig: USE AS DIRECTED EVERY 34 DAYS     Endocrinology: Diabetes - Testing Supplies Passed - 08/28/2021  1:27 AM      Passed - Valid encounter within last 12 months    Recent Outpatient Visits          3 weeks ago Sleep disorder breathing   [facility] Glean Hess, MD   4 months ago Annual physical exam   [facility] Glean Hess, MD   6 months ago Acute cystitis without hematuria   [facility] Glean Hess, MD   7 months ago [facility] Montel Culver, MD   7 months ago Acute cystitis without hematuria   [facility] Glean Hess, MD      Future Appointments            In 2 months Jonathon Bellows, MD [place]

## 2021-08-29 DIAGNOSIS — N186 End stage renal disease: Secondary | ICD-10-CM | POA: Diagnosis not present

## 2021-08-29 DIAGNOSIS — N2581 Secondary hyperparathyroidism of renal origin: Secondary | ICD-10-CM | POA: Diagnosis not present

## 2021-08-29 DIAGNOSIS — Z992 Dependence on renal dialysis: Secondary | ICD-10-CM | POA: Diagnosis not present

## 2021-08-30 LAB — PANCREATIC ELASTASE, FECAL: Pancreatic Elastase, Fecal: 378 ug Elast./g (ref >200)

## 2021-08-31 DIAGNOSIS — N2581 Secondary hyperparathyroidism of renal origin: Secondary | ICD-10-CM | POA: Diagnosis not present

## 2021-08-31 DIAGNOSIS — Z992 Dependence on renal dialysis: Secondary | ICD-10-CM | POA: Diagnosis not present

## 2021-08-31 DIAGNOSIS — N186 End stage renal disease: Secondary | ICD-10-CM | POA: Diagnosis not present

## 2021-09-04 DIAGNOSIS — N2581 Secondary hyperparathyroidism of renal origin: Secondary | ICD-10-CM | POA: Diagnosis not present

## 2021-09-04 DIAGNOSIS — N186 End stage renal disease: Secondary | ICD-10-CM | POA: Diagnosis not present

## 2021-09-04 DIAGNOSIS — Z992 Dependence on renal dialysis: Secondary | ICD-10-CM | POA: Diagnosis not present

## 2021-09-05 ENCOUNTER — Other Ambulatory Visit: Payer: Self-pay | Admitting: Internal Medicine

## 2021-09-05 NOTE — Telephone Encounter (Signed)
Requested Prescriptions  Pending Prescriptions Disp Refills   Continuous Blood Gluc Sensor (FREESTYLE LIBRE 2 SENSOR) MISC [Pharmacy Med Name: FREESTYLE LIBRE 2 SENSOR]  3    Sig: USE AS DIRECTED EVERY 29 DAYS     Endocrinology: Diabetes - Testing Supplies Passed - 09/05/2021  4:12 PM      Passed - Valid encounter within last 12 months    Recent Outpatient Visits          1 month ago Sleep disorder breathing   [facility] Glean Hess, MD   4 months ago Annual physical exam   [facility] Glean Hess, MD   6 months ago Acute cystitis without hematuria   [facility] Glean Hess, MD   7 months ago [facility] Montel Culver, MD   8 months ago Acute cystitis without hematuria   [facility] Glean Hess, MD      Future Appointments            In 2 months Jonathon Bellows, MD [place]

## 2021-09-06 ENCOUNTER — Telehealth: Payer: Self-pay | Admitting: Gastroenterology

## 2021-09-06 NOTE — Telephone Encounter (Signed)
Inbound call from pt's husband requesting a call back stating that the procedure is coded wrong and insurance isn't covering. Please advise.

## 2021-09-07 DIAGNOSIS — Z992 Dependence on renal dialysis: Secondary | ICD-10-CM | POA: Diagnosis not present

## 2021-09-07 DIAGNOSIS — N2581 Secondary hyperparathyroidism of renal origin: Secondary | ICD-10-CM | POA: Diagnosis not present

## 2021-09-07 DIAGNOSIS — N186 End stage renal disease: Secondary | ICD-10-CM | POA: Diagnosis not present

## 2021-09-09 NOTE — Telephone Encounter (Signed)
This message was sent to Dr. Vicente Males and this was his response to the patient's husband question: Inform  that she was seen by Dr. Bonna Gains and has never been seen by myself.  As documented by Dr. Bonna Gains colonoscopy was ordered for microscopic colitis and evaluation of diarrhea.  Hence it cannot be for colon cancer screening.  EGD was requested for nausea vomiting and hence cannot be for screening as she has no condition that needs to be screened with an EGD.  Informed her that Dr. Bonna Gains does not work here anymore and I cannot change something written on her note.  I then called the patient and left her a detailed message letting her know the above response. However, I told her to call us back if she had further questions, or chose to cancel her procedure since the diagnosis could not be changed.

## 2021-09-10 ENCOUNTER — Ambulatory Visit: Payer: Self-pay

## 2021-09-10 DIAGNOSIS — E113513 Type 2 diabetes mellitus with proliferative diabetic retinopathy with macular edema, bilateral: Secondary | ICD-10-CM | POA: Diagnosis not present

## 2021-09-10 DIAGNOSIS — N2581 Secondary hyperparathyroidism of renal origin: Secondary | ICD-10-CM | POA: Diagnosis not present

## 2021-09-10 DIAGNOSIS — N186 End stage renal disease: Secondary | ICD-10-CM | POA: Diagnosis not present

## 2021-09-10 DIAGNOSIS — Z992 Dependence on renal dialysis: Secondary | ICD-10-CM | POA: Diagnosis not present

## 2021-09-10 MED ORDER — DEXLANSOPRAZOLE 30 MG PO CPDR: 30.0000 mg | DELAYED_RELEASE_CAPSULE | Freq: Every day | ORAL | 0 refills | Status: DC

## 2021-09-10 MED ORDER — FAMOTIDINE 20 MG PO TABS: 20.0000 mg | ORAL_TABLET | Freq: Every evening | ORAL | 0 refills | Status: DC

## 2021-09-10 NOTE — Telephone Encounter (Signed)
Patient informed. Will call in a week to let us know if nausea is better or the same.

## 2021-09-10 NOTE — Telephone Encounter (Signed)
°  Chief Complaint: medication change Symptoms: nausea Frequency: every day Pertinent Negatives:  Disposition: [] ED /[] Urgent Care (no appt availability in office) / [] Appointment(In office/virtual)/ []  [place] [facility]/ [] Home Care/ [] Refused Recommended Disposition /[] [place] [facility]/ [x]  Follow-up with PCP Additional Notes: pt called, she is asking if coreg can be changed to something different. She states that she is nauseous or sick almost every day and feels it is related to the coreg. Pt was seen on 08/06/21. Pt didn't want to schedule an appt. Advised pt will send to Dr Army Melia and get back with her if we had any questions. Pt verbalized understanding.     Reason for Disposition  [1] Caller has NON-URGENT medicine question about med that PCP prescribed AND [2] triager unable to answer question  Answer Assessment - Initial Assessment Questions 1. NAME of MEDICATION: "What medicine are you calling about?"     coreg 2. QUESTION: "What is your question?" (e.g., double dose of medicine, side effect)     Makes me sick 3. PRESCRIBING HCP: "Who prescribed it?" Reason: if prescribed by specialist, call should be referred to that group.     Dr Army Melia  Protocols used: Medication Question Call-A-AH

## 2021-09-11 ENCOUNTER — Encounter: Admission: RE | Payer: Self-pay | Source: Home / Self Care

## 2021-09-11 ENCOUNTER — Ambulatory Visit
Admission: RE | Admit: 2021-09-11 | Payer: BC Managed Care – PPO | Source: Home / Self Care | Admitting: Gastroenterology

## 2021-09-11 SURGERY — COLONOSCOPY WITH PROPOFOL
Anesthesia: General

## 2021-09-12 DIAGNOSIS — Z992 Dependence on renal dialysis: Secondary | ICD-10-CM | POA: Diagnosis not present

## 2021-09-12 DIAGNOSIS — N186 End stage renal disease: Secondary | ICD-10-CM | POA: Diagnosis not present

## 2021-09-12 DIAGNOSIS — N2581 Secondary hyperparathyroidism of renal origin: Secondary | ICD-10-CM | POA: Diagnosis not present

## 2021-09-14 DIAGNOSIS — N186 End stage renal disease: Secondary | ICD-10-CM | POA: Diagnosis not present

## 2021-09-14 DIAGNOSIS — Z992 Dependence on renal dialysis: Secondary | ICD-10-CM | POA: Diagnosis not present

## 2021-09-14 DIAGNOSIS — N2581 Secondary hyperparathyroidism of renal origin: Secondary | ICD-10-CM | POA: Diagnosis not present

## 2021-09-16 ENCOUNTER — Other Ambulatory Visit: Payer: Self-pay | Admitting: Internal Medicine

## 2021-09-16 ENCOUNTER — Ambulatory Visit: Payer: Self-pay

## 2021-09-16 DIAGNOSIS — I10 Essential (primary) hypertension: Secondary | ICD-10-CM

## 2021-09-16 MED ORDER — METOPROLOL TARTRATE 25 MG PO TABS: 25.0000 mg | ORAL_TABLET | Freq: Two times a day (BID) | ORAL | 0 refills | Status: DC

## 2021-09-16 NOTE — Telephone Encounter (Signed)
Pt instructed to stop for a week and see if she was better. Pt would like to discuss the outcome with Dr Ron Agee nurse.  Pt states she did a home sleep test and would like to know the name of the company that sent her the test.  She said when the nurse calls, she will discuss that as well. (I could not find the name of the company)   Pt. Would like to change from Coreg to a different medication. Also, would like the name and phone number of the company that did her sleep study. They called her back about getting her "equipment, but did not leave the call back number." Please advise pt.    Answer Assessment - Initial Assessment Questions 1. NAME of MEDICATION: "What medicine are you calling about?"     Coreg  2. QUESTION: "What is your question?" (e.g., double dose of medicine, side effect)     States her nausea was better while off the Coreg, but her BP was elevated. Would like a different medication for BP. 3. PRESCRIBING HCP: "Who prescribed it?" Reason: if prescribed by specialist, call should be referred to that group.     Dr. Army Melia. 4. SYMPTOMS: "Do you have any symptoms?"      5. SEVERITY: If symptoms are present, ask "Are they mild, moderate or severe?"      6. PREGNANCY:  "Is there any chance that you are pregnant?" "When was your last menstrual period?"     No  Protocols used: Medication Question Call-A-AH

## 2021-09-16 NOTE — Telephone Encounter (Signed)
Spoke to pt gave her the number to [place]. Pt stated she stop coreg for 1 week and didn't feel nauseous. But she had an appt and her BP was elevated so she had to start taking the medication again. Pt has been back on coreg for 2 days now and experiencing nausea again.  KP

## 2021-09-17 ENCOUNTER — Telehealth: Payer: Self-pay

## 2021-09-17 DIAGNOSIS — Z992 Dependence on renal dialysis: Secondary | ICD-10-CM | POA: Diagnosis not present

## 2021-09-17 DIAGNOSIS — N2581 Secondary hyperparathyroidism of renal origin: Secondary | ICD-10-CM | POA: Diagnosis not present

## 2021-09-17 DIAGNOSIS — N186 End stage renal disease: Secondary | ICD-10-CM | POA: Diagnosis not present

## 2021-09-17 NOTE — Telephone Encounter (Signed)
I tried calling the patient back twice. Left VM with information from Dr Army Melia. She can stop Coreg and try Metoprolol 25 mg twice a day.  Rx was sent to her pharmacy. Told her to Be sure to have other providers aware of the changes and monitor BP.   PEC may give this information if patient returns call to clinic.

## 2021-09-17 NOTE — Telephone Encounter (Signed)
Patient is calling - she just finished her dialysis. Patient states her BP was up- top number being 200- but she could not tell me exact. She just got the message about the new Rx and she will get that now. Advised patient to let the office know if she had any trouble picking it up.Patient is a little frustrated she has not heard from her provider.

## 2021-09-17 NOTE — Telephone Encounter (Signed)
Copied from [place] [phone]. Topic: General - Other >> Sep 17, 2021 11:03 AM Tessa Lerner A wrote: Reason for CRM: The patient has returned a call from [place] about their blood pressure medications  Please contact further when available

## 2021-09-17 NOTE — Telephone Encounter (Signed)
Called pt left Vm to call back.  PEC nurse may give results to patient if they return call to clinic, a CRM has been created.  KP

## 2021-09-17 NOTE — Telephone Encounter (Signed)
See triage note.

## 2021-09-18 NOTE — Telephone Encounter (Signed)
Noted  KP 

## 2021-09-19 DIAGNOSIS — Z992 Dependence on renal dialysis: Secondary | ICD-10-CM | POA: Diagnosis not present

## 2021-09-19 DIAGNOSIS — N186 End stage renal disease: Secondary | ICD-10-CM | POA: Diagnosis not present

## 2021-09-19 DIAGNOSIS — N2581 Secondary hyperparathyroidism of renal origin: Secondary | ICD-10-CM | POA: Diagnosis not present

## 2021-09-21 DIAGNOSIS — Z992 Dependence on renal dialysis: Secondary | ICD-10-CM | POA: Diagnosis not present

## 2021-09-21 DIAGNOSIS — N2581 Secondary hyperparathyroidism of renal origin: Secondary | ICD-10-CM | POA: Diagnosis not present

## 2021-09-21 DIAGNOSIS — N186 End stage renal disease: Secondary | ICD-10-CM | POA: Diagnosis not present

## 2021-09-23 ENCOUNTER — Encounter: Payer: Self-pay | Admitting: Surgery

## 2021-09-23 ENCOUNTER — Ambulatory Visit (INDEPENDENT_AMBULATORY_CARE_PROVIDER_SITE_OTHER): Payer: BC Managed Care – PPO | Admitting: Surgery

## 2021-09-23 ENCOUNTER — Other Ambulatory Visit: Payer: Self-pay

## 2021-09-23 VITALS — BP 196/99 | HR 72 | Temp 98.3°F | Ht 66.0 in | Wt 166.6 lb

## 2021-09-23 DIAGNOSIS — N186 End stage renal disease: Secondary | ICD-10-CM | POA: Diagnosis not present

## 2021-09-23 DIAGNOSIS — Z992 Dependence on renal dialysis: Secondary | ICD-10-CM | POA: Diagnosis not present

## 2021-09-23 NOTE — Patient Instructions (Signed)
Our surgical scheduler will contact you with your surgery date.   If you have any other concerns, please don't hesitate to contact our office.     Why do I need my PD catheter removed? Your PD catheter will be removed for one of the following reasons: ? Following a successful kidney transplant; ? Changing your type of dialysis treatment to haemodialysis; ? If the PD catheter fails to work; ? In the event of an episode of peritonitis which does not respond to  antibiotic treatment; ? If you develop a tunnel or exit site infection which does not respond to  antibiotic treatment. Before the operation You will get an appointment to see the surgeon and the anaesthetist  before your admission to hospital. Some tests may be done at this time  such as a heart tracing, a chest x-ray and blood test. Sometimes these  tests may be done nearer to the day of your operation. It is likely that at  this time they will discuss the two options available for catheter removal.  The surgeon will discuss the different methods available as appropriate  and will also discuss the benefits and risks involved. You will be asked to  sign a form to state that you understand the operation that you are having  and that you are willing to have the operation. Removal of catheter under general anaesthesia When this method is agreed you will be given a general anaesthetic and  the surgeon will make a couple of small incisions to remove the catheter.  During the operation a small cut is made (about 2 - 3 inches) near the  tummy button. The surgeon will carefully detach the cuffs, which hold the  catheter in place from the surrounding tissue. When the catheter has been  removed the cut will be closed using stiches, staples or special clue. The  surgeon will decide what to use to give you the neatest scar. This  operation takes around 30 minutes but you will be away from the ward for  longer than this as you will wait in  theatre for transfer back to the ward.  You are likely to require more dressings to the site in the post-operative  period.  After the operation When the operation is over and you are back on the ward you will have  your blood pressure, pulse and wound checked regularly by the nurses. If  you have had you catheter pulled out without general anaesthesia, you will  be able to eat and drink on return to the ward. You will be able to go home  after 1 hour.  You may feel some discomfort around the site of the operation or in the  tummy. Please let the nurses know and they will give you some  painkillers. If you have had a general anaesthetic a doctor will need to  check that your bowel has not gone to sleep by listening to you tummy  with a stethoscope. If they can hear your bowel making noises (which is  perfectly normal) then you will be allowed small drinks at first, building up  to eating and drinking normally. Most people will go home 4 - 6 hours after the operation if they have had a  general anaesthetic providing they are eating and drinking normally. Following discharge If you have had your catheter pulled out you will have a small dry dressing  over the old exit site. This can be changed as necessary by PD nurse,  Practice Nurses or yourselves if you are happy to do so.

## 2021-09-24 ENCOUNTER — Telehealth: Payer: Self-pay | Admitting: Surgery

## 2021-09-24 DIAGNOSIS — N186 End stage renal disease: Secondary | ICD-10-CM | POA: Diagnosis not present

## 2021-09-24 DIAGNOSIS — N2581 Secondary hyperparathyroidism of renal origin: Secondary | ICD-10-CM | POA: Diagnosis not present

## 2021-09-24 DIAGNOSIS — Z992 Dependence on renal dialysis: Secondary | ICD-10-CM | POA: Diagnosis not present

## 2021-09-24 NOTE — Progress Notes (Signed)
Outpatient Surgical Follow Up  09/24/2021  Kelli [name] is an 61 y.o. female.   Chief Complaint  Patient presents with   New Patient (Initial Visit)    Peritoneal dialysis catheter removal- changing to hemodialysis    HPI: Kelli [name] is a 61 year old female well-known to me with a prior history of peritoneal dialysis placement 3 months and a half ago.  She was unable to tolerate this form of dialysis due to pain when infusing the dialysate  fluid.  She now is receiving hemodialysis on Tuesday Thursday and Fridays.No fevers or chills, no current abd pain  Past Medical History:  Diagnosis Date   Chronic kidney disease    Diabetes mellitus without complication ([place])    Migraine    Staph infection     Past Surgical History:  Procedure Laterality Date   ABDOMINAL HYSTERECTOMY     CAPD INSERTION N/A 05/30/2021   Procedure: CONTINUOUS AMBULATORY PERITONEAL DIALYSIS  (CAPD) CATHETER INSERTION;  Surgeon: Jules Husbands, MD;  Location: [facility]S;  Service: General;  Laterality: N/A;   COLONOSCOPY W/ POLYPECTOMY     DIALYSIS/PERMA CATHETER INSERTION N/A 05/13/2021   Procedure: DIALYSIS/PERMA CATHETER INSERTION;  Surgeon: Algernon Huxley, MD;  Location: [facility];  Service: Cardiovascular;  Laterality: N/A;   ESOPHAGOGASTRODUODENOSCOPY  03/02/2017   Normal   HEMORRHOID SURGERY      Family History  Problem Relation Age of Onset   Heart failure Mother    Diabetes Mother    Breast cancer Neg Hx     Social History:  reports that she has quit smoking. She has never used smokeless tobacco. She reports that she does not drink alcohol and does not use drugs.  Allergies:  Allergies  Allergen Reactions   Statins Nausea And Vomiting   Albumin Human Other (See Comments)    Refuses all blood products as one of [employer] Refuses all blood products as one of [employer]    Gabapentin Swelling   Hydrochlorothiazide Nausea And Vomiting   Lisinopril Other (See Comments)     fatigue   Lyrica [Pregabalin] Swelling   Metformin And Related Nausea And Vomiting   Penicillins Nausea And Vomiting    Medications reviewed.    ROS Full ROS performed and is otherwise negative other than what is stated in HPI   BP (!) 196/99    Pulse 72    Temp 98.3 F (36.8 C) (Oral)    Ht 5\' 6"  (1.676 m)    Wt 166 lb 9.6 oz (75.6 kg)    SpO2 96%    BMI 26.89 kg/m   Physical Exam Vitals and nursing note reviewed. Exam conducted with a chaperone present.  Constitutional:      Appearance: Normal appearance. She is normal weight.  Cardiovascular:     Rate and Rhythm: Normal rate and regular rhythm.     Heart sounds:    No friction rub.  Pulmonary:     Effort: Pulmonary effort is normal. No respiratory distress.     Breath sounds: No stridor. No wheezing.  Abdominal:     General: Abdomen is flat. There is no distension.     Palpations: Abdomen is soft. There is no mass.     Tenderness: There is no abdominal tenderness. There is no guarding or rebound.     Hernia: No hernia is present.     Comments: PD cath in place w/o infection,  Musculoskeletal:        General: No swelling or  tenderness. Normal range of motion.     Cervical back: Normal range of motion and neck supple. No rigidity or tenderness.  Lymphadenopathy:     Cervical: No cervical adenopathy.  Skin:    General: Skin is warm and dry.     Capillary Refill: Capillary refill takes less than 2 seconds.  Neurological:     General: No focal deficit present.     Mental Status: She is alert and oriented to person, place, and time.  Psychiatric:        Mood and Affect: Mood normal.        Behavior: Behavior normal.        Thought Content: Thought content normal.        Judgment: Judgment normal.       Assessment/Plan: 61 year old female with end-stage renal disease on hemodialysis unable to tolerate peritoneal dialysis due to distention and pain.  She wishes to move forward with catheter removal.  I do think  this is reasonable.  Procedure discussed with the patient in detail.  Risks, benefits and possible complications including but not limited to: Bleeding, infection injury to adjacent structures, chronic pain.  She understands and wished to proceed.  Please note that I spent greater than 40 minutes in this encounter including personally reviewing medical records, counseling the patient, placing orders and performing appropriate documentation  Caroleen Hamman, MD [facility] Surgeon

## 2021-09-24 NOTE — Telephone Encounter (Signed)
Outgoing call is made, left message to call. Please inform patient of the following regarding scheduled surgery:     Pre-Admission date/time, COVID Testing date and Surgery date.  Surgery Date: 10/18/21 Preadmission Testing Date: 10/09/21 (phone 8a-1p) Covid Testing Date: Not needed.     Also patient will need to call at [phone], between 1-3:00pm the day before surgery, to find out what time to arrive for surgery.

## 2021-09-26 DIAGNOSIS — N2581 Secondary hyperparathyroidism of renal origin: Secondary | ICD-10-CM | POA: Diagnosis not present

## 2021-09-26 DIAGNOSIS — N186 End stage renal disease: Secondary | ICD-10-CM | POA: Diagnosis not present

## 2021-09-26 DIAGNOSIS — Z992 Dependence on renal dialysis: Secondary | ICD-10-CM | POA: Diagnosis not present

## 2021-09-26 DIAGNOSIS — I5189 Other ill-defined heart diseases: Secondary | ICD-10-CM

## 2021-09-26 HISTORY — DX: Other ill-defined heart diseases: I51.89

## 2021-09-26 NOTE — Telephone Encounter (Signed)
Outgoing call is made again, this time was able to speak with the patient.  She has been hard to get in touch with.  She is now informed of all dates regarding her surgery and verbalized understanding.  It is also reminded of her to please make sure she takes the call from preadmissions when they call.

## 2021-09-27 DIAGNOSIS — R110 Nausea: Secondary | ICD-10-CM | POA: Diagnosis not present

## 2021-09-27 DIAGNOSIS — R112 Nausea with vomiting, unspecified: Secondary | ICD-10-CM | POA: Diagnosis not present

## 2021-09-27 DIAGNOSIS — I129 Hypertensive chronic kidney disease with stage 1 through stage 4 chronic kidney disease, or unspecified chronic kidney disease: Secondary | ICD-10-CM | POA: Diagnosis not present

## 2021-09-27 DIAGNOSIS — Z8249 Family history of ischemic heart disease and other diseases of the circulatory system: Secondary | ICD-10-CM | POA: Diagnosis not present

## 2021-09-27 DIAGNOSIS — N186 End stage renal disease: Secondary | ICD-10-CM | POA: Diagnosis not present

## 2021-09-27 DIAGNOSIS — Z794 Long term (current) use of insulin: Secondary | ICD-10-CM | POA: Diagnosis not present

## 2021-09-27 DIAGNOSIS — E1122 Type 2 diabetes mellitus with diabetic chronic kidney disease: Secondary | ICD-10-CM | POA: Diagnosis not present

## 2021-09-27 DIAGNOSIS — R9439 Abnormal result of other cardiovascular function study: Secondary | ICD-10-CM | POA: Diagnosis not present

## 2021-09-27 DIAGNOSIS — Z992 Dependence on renal dialysis: Secondary | ICD-10-CM | POA: Diagnosis not present

## 2021-09-27 DIAGNOSIS — R0602 Shortness of breath: Secondary | ICD-10-CM | POA: Diagnosis not present

## 2021-09-27 DIAGNOSIS — Z5181 Encounter for therapeutic drug level monitoring: Secondary | ICD-10-CM | POA: Diagnosis not present

## 2021-09-27 DIAGNOSIS — Z79899 Other long term (current) drug therapy: Secondary | ICD-10-CM | POA: Diagnosis not present

## 2021-09-27 DIAGNOSIS — I120 Hypertensive chronic kidney disease with stage 5 chronic kidney disease or end stage renal disease: Secondary | ICD-10-CM | POA: Diagnosis not present

## 2021-09-27 DIAGNOSIS — Z87891 Personal history of nicotine dependence: Secondary | ICD-10-CM | POA: Diagnosis not present

## 2021-09-27 DIAGNOSIS — R079 Chest pain, unspecified: Secondary | ICD-10-CM | POA: Diagnosis not present

## 2021-09-27 DIAGNOSIS — E1022 Type 1 diabetes mellitus with diabetic chronic kidney disease: Secondary | ICD-10-CM | POA: Diagnosis not present

## 2021-09-27 DIAGNOSIS — Z01810 Encounter for preprocedural cardiovascular examination: Secondary | ICD-10-CM | POA: Diagnosis not present

## 2021-09-27 DIAGNOSIS — N189 Chronic kidney disease, unspecified: Secondary | ICD-10-CM | POA: Diagnosis not present

## 2021-09-27 LAB — HEMOGLOBIN A1C: Hemoglobin A1C: 8.7

## 2021-09-28 ENCOUNTER — Other Ambulatory Visit: Payer: Self-pay | Admitting: Internal Medicine

## 2021-09-28 DIAGNOSIS — N186 End stage renal disease: Secondary | ICD-10-CM | POA: Diagnosis not present

## 2021-09-28 DIAGNOSIS — Z992 Dependence on renal dialysis: Secondary | ICD-10-CM | POA: Diagnosis not present

## 2021-09-28 DIAGNOSIS — N2581 Secondary hyperparathyroidism of renal origin: Secondary | ICD-10-CM | POA: Diagnosis not present

## 2021-09-30 DIAGNOSIS — G4733 Obstructive sleep apnea (adult) (pediatric): Secondary | ICD-10-CM | POA: Diagnosis not present

## 2021-09-30 NOTE — Telephone Encounter (Signed)
Requested by interface surescripts. Medication discontinued 08/06/21.  Requested Prescriptions  Refused Prescriptions Disp Refills   losartan (COZAAR) 100 MG tablet [Pharmacy Med Name: LOSARTAN POTASSIUM 100 MG TAB] 90 tablet 0    Sig: TAKE 1 TABLET BY MOUTH EVERY DAY     Cardiovascular:  Angiotensin Receptor Blockers Failed - 09/28/2021 10:52 AM      Failed - Cr in normal range and within 180 days    Creatinine, Ser  Date Value Ref Range Status  05/31/2021 3.63 (H) 0.44 - 1.00 mg/dL Final         Failed - Last BP in normal range    BP Readings from Last 1 Encounters:  09/23/21 (!) 196/99         Passed - K in normal range and within 180 days    Potassium  Date Value Ref Range Status  05/31/2021 3.6 3.5 - 5.1 mmol/L Final         Passed - Patient is not pregnant      Passed - Valid encounter within last 6 months    Recent Outpatient Visits          1 month ago Sleep disorder breathing   [facility] Glean Hess, MD   5 months ago Annual physical exam   [facility] Glean Hess, MD   7 months ago Acute cystitis without hematuria   [facility] Glean Hess, MD   8 months ago [facility] Montel Culver, MD   9 months ago Acute cystitis without hematuria   [facility] Glean Hess, MD      Future Appointments            In 1 month Jonathon Bellows, MD [place]

## 2021-10-02 ENCOUNTER — Other Ambulatory Visit: Payer: Self-pay | Admitting: Internal Medicine

## 2021-10-02 NOTE — Telephone Encounter (Signed)
Requested medication (s) are due for refill today:   No for both  Requested medication (s) are on the active medication list:   No for both  Future visit scheduled:   No    Seen a mo. ago   Last ordered: Dexlansoprazole discontinued 09/23/2021 by Dr. Army Melia;   Pepcid also discontinued on 09/23/2021 by Dr. Annie Paras.     Requested Prescriptions  Pending Prescriptions Disp Refills   Dexlansoprazole 30 MG capsule DR Mackey Birchwood Med Name: DEXLANSOPRAZOLE DR 30 MG CAP] 30 capsule 0    Sig: TAKE 1 CAPSULE BY MOUTH EVERY DAY     Gastroenterology: Proton Pump Inhibitors Passed - 10/02/2021 12:30 PM      Passed - Valid encounter within last 12 months    Recent Outpatient Visits           1 month ago Sleep disorder breathing   [facility] Glean Hess, MD   5 months ago Annual physical exam   [facility] Glean Hess, MD   7 months ago Acute cystitis without hematuria   [facility] Glean Hess, MD   8 months ago [facility] Montel Culver, MD   9 months ago Acute cystitis without hematuria   [place], [place]       Future Appointments             In 1 month Jonathon Bellows, MD [place]             famotidine (PEPCID) 20 MG tablet [Pharmacy Med Name: FAMOTIDINE 20 MG TABLET] 30 tablet 0    Sig: TAKE 1 TABLET BY MOUTH EVERY DAY IN THE EVENING     Gastroenterology:  H2 Antagonists Passed - 10/02/2021 12:30 PM      Passed - Valid encounter within last 12 months    Recent Outpatient Visits           1 month ago Sleep disorder breathing   [facility] Glean Hess, MD   5 months ago Annual physical exam   [facility] Glean Hess, MD   7 months ago Acute cystitis without hematuria   [facility] Glean Hess, MD   8 months ago [facility] Montel Culver, MD   9 months ago Acute cystitis without hematuria    [facility] Glean Hess, MD       Future Appointments             In 1 month Jonathon Bellows, MD [place]

## 2021-10-02 NOTE — Telephone Encounter (Signed)
Requested medication (s) are due for refill today:   No  Requested medication (s) are on the active medication list:   No  Future visit scheduled:   No   Seen a mo. ago   Last ordered: Discontinued 09/16/2021 by Dr. Army Melia  Returned because not on medication list.      Requested Prescriptions  Pending Prescriptions Disp Refills   carvedilol (COREG) 12.5 MG tablet [Pharmacy Med Name: CARVEDILOL 12.5 MG TABLET] 180 tablet 1    Sig: TAKE 1 TABLET BY MOUTH 2 TIMES DAILY.     Cardiovascular: Beta Blockers 3 Failed - 10/02/2021  1:43 AM      Failed - Cr in normal range and within 360 days    Creatinine, Ser  Date Value Ref Range Status  05/31/2021 3.63 (H) 0.44 - 1.00 mg/dL Final          Failed - Last BP in normal range    BP Readings from Last 1 Encounters:  09/23/21 (!) 196/99          Passed - AST in normal range and within 360 days    AST  Date Value Ref Range Status  05/26/2021 19 15 - 41 U/L Final          Passed - ALT in normal range and within 360 days    ALT  Date Value Ref Range Status  05/26/2021 11 0 - 44 U/L Final          Passed - Last Heart Rate in normal range    Pulse Readings from Last 1 Encounters:  09/23/21 72          Passed - Valid encounter within last 6 months    Recent Outpatient Visits           1 month ago Sleep disorder breathing   [facility] Glean Hess, MD   5 months ago Annual physical exam   [facility] Glean Hess, MD   7 months ago Acute cystitis without hematuria   [facility] Glean Hess, MD   8 months ago [facility] Montel Culver, MD   9 months ago Acute cystitis without hematuria   [facility] Glean Hess, MD       Future Appointments             In 1 month Jonathon Bellows, MD [place]

## 2021-10-03 DIAGNOSIS — Z992 Dependence on renal dialysis: Secondary | ICD-10-CM | POA: Diagnosis not present

## 2021-10-03 DIAGNOSIS — N2581 Secondary hyperparathyroidism of renal origin: Secondary | ICD-10-CM | POA: Diagnosis not present

## 2021-10-03 DIAGNOSIS — N186 End stage renal disease: Secondary | ICD-10-CM | POA: Diagnosis not present

## 2021-10-05 DIAGNOSIS — Z992 Dependence on renal dialysis: Secondary | ICD-10-CM | POA: Diagnosis not present

## 2021-10-05 DIAGNOSIS — N186 End stage renal disease: Secondary | ICD-10-CM | POA: Diagnosis not present

## 2021-10-05 DIAGNOSIS — N2581 Secondary hyperparathyroidism of renal origin: Secondary | ICD-10-CM | POA: Diagnosis not present

## 2021-10-07 DIAGNOSIS — E1122 Type 2 diabetes mellitus with diabetic chronic kidney disease: Secondary | ICD-10-CM | POA: Diagnosis not present

## 2021-10-07 DIAGNOSIS — Z7682 Awaiting organ transplant status: Secondary | ICD-10-CM | POA: Diagnosis not present

## 2021-10-07 DIAGNOSIS — Z992 Dependence on renal dialysis: Secondary | ICD-10-CM | POA: Diagnosis not present

## 2021-10-07 DIAGNOSIS — N186 End stage renal disease: Secondary | ICD-10-CM | POA: Diagnosis not present

## 2021-10-07 DIAGNOSIS — I120 Hypertensive chronic kidney disease with stage 5 chronic kidney disease or end stage renal disease: Secondary | ICD-10-CM | POA: Diagnosis not present

## 2021-10-07 DIAGNOSIS — Z01810 Encounter for preprocedural cardiovascular examination: Secondary | ICD-10-CM | POA: Diagnosis not present

## 2021-10-07 DIAGNOSIS — Z955 Presence of coronary angioplasty implant and graft: Secondary | ICD-10-CM | POA: Insufficient documentation

## 2021-10-07 DIAGNOSIS — I2510 Atherosclerotic heart disease of native coronary artery without angina pectoris: Secondary | ICD-10-CM | POA: Insufficient documentation

## 2021-10-07 HISTORY — DX: Atherosclerotic heart disease of native coronary artery without angina pectoris: I25.10

## 2021-10-07 HISTORY — PX: CORONARY ANGIOPLASTY WITH STENT PLACEMENT: SHX49

## 2021-10-08 DIAGNOSIS — N186 End stage renal disease: Secondary | ICD-10-CM | POA: Diagnosis not present

## 2021-10-08 DIAGNOSIS — N2581 Secondary hyperparathyroidism of renal origin: Secondary | ICD-10-CM | POA: Diagnosis not present

## 2021-10-08 DIAGNOSIS — Z992 Dependence on renal dialysis: Secondary | ICD-10-CM | POA: Diagnosis not present

## 2021-10-09 ENCOUNTER — Telehealth: Payer: Self-pay

## 2021-10-09 ENCOUNTER — Inpatient Hospital Stay
Admission: RE | Admit: 2021-10-09 | Discharge: 2021-10-09 | Disposition: A | Discharge status: Home / Self Care | Payer: BC Managed Care – PPO | Source: Ambulatory Visit

## 2021-10-09 DIAGNOSIS — B0051 Herpesviral iridocyclitis: Secondary | ICD-10-CM | POA: Diagnosis not present

## 2021-10-09 DIAGNOSIS — H4052X4 Glaucoma secondary to other eye disorders, left eye, indeterminate stage: Secondary | ICD-10-CM | POA: Diagnosis not present

## 2021-10-09 HISTORY — DX: Malignant (primary) neoplasm, unspecified: C80.1

## 2021-10-09 HISTORY — DX: Peptic ulcer, site unspecified, unspecified as acute or chronic, without hemorrhage or perforation: K27.9

## 2021-10-09 HISTORY — DX: Hyperlipidemia, unspecified: E78.5

## 2021-10-09 HISTORY — DX: Depression, unspecified: F32.A

## 2021-10-09 HISTORY — DX: Procedure and treatment not carried out because of patient's decision for reasons of belief and group pressure: Z53.1

## 2021-10-09 HISTORY — DX: Type 2 diabetes mellitus with diabetic neuropathic arthropathy: E11.610

## 2021-10-09 HISTORY — DX: Reserved for inherently not codable concepts without codable children: IMO0001

## 2021-10-09 HISTORY — DX: 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, left eye, severe stage: H40.42X3

## 2021-10-09 HISTORY — DX: Polyneuropathy, unspecified: G62.9

## 2021-10-09 HISTORY — DX: Dependence on renal dialysis: Z99.2

## 2021-10-09 HISTORY — DX: Unspecified chorioretinal inflammation, bilateral: H30.93

## 2021-10-09 NOTE — Telephone Encounter (Signed)
Cardiac Clearance faxed to Dr.Neha Malachi [place] @ [phone].

## 2021-10-10 DIAGNOSIS — Z992 Dependence on renal dialysis: Secondary | ICD-10-CM | POA: Diagnosis not present

## 2021-10-10 DIAGNOSIS — N2581 Secondary hyperparathyroidism of renal origin: Secondary | ICD-10-CM | POA: Diagnosis not present

## 2021-10-10 DIAGNOSIS — N186 End stage renal disease: Secondary | ICD-10-CM | POA: Diagnosis not present

## 2021-10-11 ENCOUNTER — Other Ambulatory Visit: Payer: Self-pay | Admitting: Internal Medicine

## 2021-10-12 DIAGNOSIS — N2581 Secondary hyperparathyroidism of renal origin: Secondary | ICD-10-CM | POA: Diagnosis not present

## 2021-10-12 DIAGNOSIS — Z992 Dependence on renal dialysis: Secondary | ICD-10-CM | POA: Diagnosis not present

## 2021-10-12 DIAGNOSIS — N186 End stage renal disease: Secondary | ICD-10-CM | POA: Diagnosis not present

## 2021-10-12 NOTE — Telephone Encounter (Signed)
Refusing due to not on current med list.  Requested Prescriptions  Pending Prescriptions Disp Refills   Dexlansoprazole 30 MG capsule DR Mackey Birchwood Med Name: DEXLANSOPRAZOLE DR 30 MG CAP] 30 capsule 0    Sig: TAKE [place]     Gastroenterology: Proton Pump Inhibitors Passed - 10/11/2021  4:28 PM      Passed - Valid encounter within last 12 months    Recent Outpatient Visits          2 months ago Sleep disorder breathing   [facility] Glean Hess, MD   6 months ago Annual physical exam   [facility] Glean Hess, MD   7 months ago Acute cystitis without hematuria   [facility] Glean Hess, MD   8 months ago [facility] Montel Culver, MD   9 months ago Acute cystitis without hematuria   [facility] Glean Hess, MD      Future Appointments            In 1 month Jonathon Bellows, MD [place]

## 2021-10-12 NOTE — Telephone Encounter (Signed)
Refusing due to not on current med list.

## 2021-10-14 ENCOUNTER — Ambulatory Visit: Payer: Self-pay | Admitting: *Deleted

## 2021-10-14 ENCOUNTER — Other Ambulatory Visit: Payer: Self-pay | Admitting: Internal Medicine

## 2021-10-14 DIAGNOSIS — R112 Nausea with vomiting, unspecified: Secondary | ICD-10-CM

## 2021-10-14 MED ORDER — PROMETHAZINE HCL 12.5 MG PO TABS: 12.5000 mg | ORAL_TABLET | Freq: Three times a day (TID) | ORAL | 0 refills | Status: DC | PRN

## 2021-10-14 NOTE — Telephone Encounter (Signed)
Called pt let her know a refill was sent in. Pt verbalized understanding.  KP

## 2021-10-14 NOTE — Telephone Encounter (Signed)
Summary: vomiting   Patient called in, has vomiting goin on , asking formed to be sent to [id number] [place], [name]  Phone: [phone]  Fax: [phone]      Reason for Disposition  [1] MODERATE vomiting (e.g., 3 - 5 times/day) AND [2] age > 60 years  Answer Assessment - Initial Assessment Questions 1. VOMITING SEVERITY: "How many times have you vomited in the past 24 hours?"     - MILD:  1 - 2 times/day    - MODERATE: 3 - 5 times/day, decreased oral intake without significant weight loss or symptoms of dehydration    - SEVERE: 6 or more times/day, vomits everything or nearly everything, with significant weight loss, symptoms of dehydration      Moderate/severe 2. ONSET: "When did the vomiting begin?"      chronic 3. FLUIDS: "What fluids or food have you vomited up today?" "Have you been able to keep any fluids down?"     Some fluids- ensure, water 4. ABDOMINAL PAIN: "Are your having any abdominal pain?" If yes : "How bad is it and what does it feel like?" (e.g., crampy, dull, intermittent, constant)      Yes- chronic 5. DIARRHEA: "Is there any diarrhea?" If Yes, ask: "How many times today?"      no 6. CONTACTS: "Is there anyone else in the family with the same symptoms?"      no 7. CAUSE: "What do you think is causing your vomiting?"     GI cause- patient is dialysis patient and she takes phenergan to take- patient states the Zofran is not helpful 8. HYDRATION STATUS: "Any signs of dehydration?" (e.g., dry mouth [not only dry lips], too weak to stand) "When did you last urinate?"     no 9. OTHER SYMPTOMS: "Do you have any other symptoms?" (e.g., fever, headache, vertigo, vomiting blood or coffee grounds, recent head injury)     no 10. PREGNANCY: "Is there any chance you are pregnant?" "When was your last menstrual period?"       na  Protocols used: Vomiting-A-AH

## 2021-10-14 NOTE — Telephone Encounter (Signed)
Please review.  KP

## 2021-10-14 NOTE — Telephone Encounter (Signed)
°  Chief Complaint: vomiting 4 days Symptoms: vomiting- patient states this is chronic problem and provider is aware, abdominal pain Frequency: chronic- but 4 days Pertinent Negatives: Patient denies fever Disposition: [] ED /[] Urgent Care (no appt availability in office) / [] Appointment(In office/virtual)/ []  [facility]/ [] Home Care/ [x] Refused Recommended Disposition /[] [facility]/ []  Follow-up with PCP Additional Notes: Patient requesting phenergan Rx- she states she takes that at dialysis and it is helpful. Patient states PCP is aware of her vomiting. Patient advised she may need to come to office- she declines visit- states she is sick and doesn't feel like coming.

## 2021-10-15 DIAGNOSIS — N2581 Secondary hyperparathyroidism of renal origin: Secondary | ICD-10-CM | POA: Diagnosis not present

## 2021-10-15 DIAGNOSIS — N186 End stage renal disease: Secondary | ICD-10-CM | POA: Diagnosis not present

## 2021-10-15 DIAGNOSIS — Z992 Dependence on renal dialysis: Secondary | ICD-10-CM | POA: Diagnosis not present

## 2021-10-17 DIAGNOSIS — Z992 Dependence on renal dialysis: Secondary | ICD-10-CM | POA: Diagnosis not present

## 2021-10-17 DIAGNOSIS — N2581 Secondary hyperparathyroidism of renal origin: Secondary | ICD-10-CM | POA: Diagnosis not present

## 2021-10-17 DIAGNOSIS — N186 End stage renal disease: Secondary | ICD-10-CM | POA: Diagnosis not present

## 2021-10-18 ENCOUNTER — Ambulatory Visit: Admit: 2021-10-18 | Payer: BC Managed Care – PPO | Admitting: Surgery

## 2021-10-18 SURGERY — LAPAROSCOPIC REMOVAL CONTINUOUS AMBULATORY PERITONEAL DIALYSIS  (CAPD) CATHETER
Anesthesia: General

## 2021-10-21 ENCOUNTER — Other Ambulatory Visit: Payer: Self-pay | Admitting: Internal Medicine

## 2021-10-22 DIAGNOSIS — N2581 Secondary hyperparathyroidism of renal origin: Secondary | ICD-10-CM | POA: Diagnosis not present

## 2021-10-22 DIAGNOSIS — N186 End stage renal disease: Secondary | ICD-10-CM | POA: Diagnosis not present

## 2021-10-22 DIAGNOSIS — Z992 Dependence on renal dialysis: Secondary | ICD-10-CM | POA: Diagnosis not present

## 2021-10-22 NOTE — Telephone Encounter (Signed)
Requested medication (s) are due for refill today - no  Requested medication (s) are on the active medication list -no  Future visit scheduled -no  Last refill: unsure- no longer current on medication list  Notes to clinic: Request RF: medication not on current medication list  Requested Prescriptions  Pending Prescriptions Disp Refills   atorvastatin (LIPITOR) 40 MG tablet [Pharmacy Med Name: ATORVASTATIN 40 MG TABLET] 30 tablet     Sig: TAKE 1 TABLET (40 MG TOTAL) BY MOUTH ONCE DAILY FOR 30 DAYS     Cardiovascular:  Antilipid - Statins Failed - 10/21/2021 12:33 PM      Failed - Lipid Panel in normal range within the last 12 months    Cholesterol, Total  Date Value Ref Range Status  04/10/2021 259 (H) 100 - 199 mg/dL Final   LDL Chol Calc (NIH)  Date Value Ref Range Status  04/10/2021 168 (H) 0 - 99 mg/dL Final   HDL  Date Value Ref Range Status  04/10/2021 62 >39 mg/dL Final   Triglycerides  Date Value Ref Range Status  04/10/2021 162 (H) 0 - 149 mg/dL Final         Passed - Patient is not pregnant      Passed - Valid encounter within last 12 months    Recent Outpatient Visits           2 months ago Sleep disorder breathing   [facility] Glean Hess, MD   6 months ago Annual physical exam   [facility] Glean Hess, MD   8 months ago Acute cystitis without hematuria   [facility] Glean Hess, MD   8 months ago [facility] Montel Culver, MD   9 months ago Acute cystitis without hematuria   [facility] Glean Hess, MD       Future Appointments             In 4 weeks Jonathon Bellows, MD [place]               Requested Prescriptions  Pending Prescriptions Disp Refills   atorvastatin (LIPITOR) 40 MG tablet [Pharmacy Med Name: ATORVASTATIN 40 MG TABLET] 30 tablet     Sig: TAKE 1 TABLET (40 MG TOTAL) BY MOUTH ONCE DAILY FOR 30 DAYS     Cardiovascular:   Antilipid - Statins Failed - 10/21/2021 12:33 PM      Failed - Lipid Panel in normal range within the last 12 months    Cholesterol, Total  Date Value Ref Range Status  04/10/2021 259 (H) 100 - 199 mg/dL Final   LDL Chol Calc (NIH)  Date Value Ref Range Status  04/10/2021 168 (H) 0 - 99 mg/dL Final   HDL  Date Value Ref Range Status  04/10/2021 62 >39 mg/dL Final   Triglycerides  Date Value Ref Range Status  04/10/2021 162 (H) 0 - 149 mg/dL Final         Passed - Patient is not pregnant      Passed - Valid encounter within last 12 months    Recent Outpatient Visits           2 months ago Sleep disorder breathing   [facility] Glean Hess, MD   6 months ago Annual physical exam   [facility] Glean Hess, MD   8 months ago Acute cystitis without hematuria   [facility]  Glean Hess, MD   8 months ago [facility] Montel Culver, MD   9 months ago Acute cystitis without hematuria   [facility] Glean Hess, MD       Future Appointments             In 4 weeks Jonathon Bellows, MD [place]

## 2021-10-24 DIAGNOSIS — Z992 Dependence on renal dialysis: Secondary | ICD-10-CM | POA: Diagnosis not present

## 2021-10-24 DIAGNOSIS — N186 End stage renal disease: Secondary | ICD-10-CM | POA: Diagnosis not present

## 2021-10-24 DIAGNOSIS — N2581 Secondary hyperparathyroidism of renal origin: Secondary | ICD-10-CM | POA: Diagnosis not present

## 2021-10-26 DIAGNOSIS — N186 End stage renal disease: Secondary | ICD-10-CM | POA: Diagnosis not present

## 2021-10-26 DIAGNOSIS — N2581 Secondary hyperparathyroidism of renal origin: Secondary | ICD-10-CM | POA: Diagnosis not present

## 2021-10-26 DIAGNOSIS — Z992 Dependence on renal dialysis: Secondary | ICD-10-CM | POA: Diagnosis not present

## 2021-10-27 ENCOUNTER — Other Ambulatory Visit: Payer: Self-pay | Admitting: Internal Medicine

## 2021-10-27 DIAGNOSIS — F331 Major depressive disorder, recurrent, moderate: Secondary | ICD-10-CM

## 2021-10-28 DIAGNOSIS — G4733 Obstructive sleep apnea (adult) (pediatric): Secondary | ICD-10-CM | POA: Diagnosis not present

## 2021-10-29 DIAGNOSIS — N186 End stage renal disease: Secondary | ICD-10-CM | POA: Diagnosis not present

## 2021-10-29 DIAGNOSIS — Z992 Dependence on renal dialysis: Secondary | ICD-10-CM | POA: Diagnosis not present

## 2021-10-29 DIAGNOSIS — N2581 Secondary hyperparathyroidism of renal origin: Secondary | ICD-10-CM | POA: Diagnosis not present

## 2021-10-29 NOTE — Telephone Encounter (Signed)
Requested medications are due for refill today.  unsure ? ?Requested medications are on the active medications list.  yes ? ?Last refill. 10/12/2020 ? ?Future visit scheduled.   no ? ?Notes to clinic.  Listed as a historical medication. ? ? ? ?Requested Prescriptions  ?Pending Prescriptions Disp Refills  ? escitalopram (LEXAPRO) 5 MG tablet [Pharmacy Med Name: ESCITALOPRAM 5 MG TABLET] 90 tablet 1  ?  Sig: TAKE 1 TABLET (5 MG TOTAL) BY MOUTH DAILY.  ?  ? Psychiatry:  Antidepressants - SSRI Passed - 10/27/2021 11:07 AM  ?  ?  Passed - Completed PHQ-2 or PHQ-9 in the last 360 days  ?  ?  Passed - Valid encounter within last 6 months  ?  Recent Outpatient Visits   ? ?      ? 2 months ago Sleep disorder breathing  ? [facility] Glean Hess, MD  ? 6 months ago Annual physical exam  ? [facility] Glean Hess, MD  ? 8 months ago Acute cystitis without hematuria  ? [facility] Glean Hess, MD  ? 9 months ago Dysuria  ? [facility] Montel Culver, MD  ? 10 months ago Acute cystitis without hematuria  ? [facility] Glean Hess, MD  ? ?  ?  ?Future Appointments   ? ?        ? In 3 weeks Jonathon Bellows, MD [place]  ? ?  ? ?  ?  ?  ?  ?

## 2021-10-31 DIAGNOSIS — N2581 Secondary hyperparathyroidism of renal origin: Secondary | ICD-10-CM | POA: Diagnosis not present

## 2021-10-31 DIAGNOSIS — N186 End stage renal disease: Secondary | ICD-10-CM | POA: Diagnosis not present

## 2021-10-31 DIAGNOSIS — Z992 Dependence on renal dialysis: Secondary | ICD-10-CM | POA: Diagnosis not present

## 2021-11-05 ENCOUNTER — Encounter (INDEPENDENT_AMBULATORY_CARE_PROVIDER_SITE_OTHER): Payer: Medicare Other

## 2021-11-05 ENCOUNTER — Other Ambulatory Visit (INDEPENDENT_AMBULATORY_CARE_PROVIDER_SITE_OTHER): Payer: Medicare Other

## 2021-11-05 ENCOUNTER — Encounter (INDEPENDENT_AMBULATORY_CARE_PROVIDER_SITE_OTHER): Payer: Medicare Other | Admitting: Vascular Surgery

## 2021-11-05 DIAGNOSIS — N2581 Secondary hyperparathyroidism of renal origin: Secondary | ICD-10-CM | POA: Diagnosis not present

## 2021-11-05 DIAGNOSIS — Z992 Dependence on renal dialysis: Secondary | ICD-10-CM | POA: Diagnosis not present

## 2021-11-05 DIAGNOSIS — N186 End stage renal disease: Secondary | ICD-10-CM | POA: Diagnosis not present

## 2021-11-07 DIAGNOSIS — Z992 Dependence on renal dialysis: Secondary | ICD-10-CM | POA: Diagnosis not present

## 2021-11-07 DIAGNOSIS — N2581 Secondary hyperparathyroidism of renal origin: Secondary | ICD-10-CM | POA: Diagnosis not present

## 2021-11-07 DIAGNOSIS — N186 End stage renal disease: Secondary | ICD-10-CM | POA: Diagnosis not present

## 2021-11-09 DIAGNOSIS — N186 End stage renal disease: Secondary | ICD-10-CM | POA: Diagnosis not present

## 2021-11-09 DIAGNOSIS — N2581 Secondary hyperparathyroidism of renal origin: Secondary | ICD-10-CM | POA: Diagnosis not present

## 2021-11-09 DIAGNOSIS — Z992 Dependence on renal dialysis: Secondary | ICD-10-CM | POA: Diagnosis not present

## 2021-11-11 ENCOUNTER — Telehealth: Payer: Self-pay | Admitting: Gastroenterology

## 2021-11-11 ENCOUNTER — Other Ambulatory Visit: Payer: Self-pay

## 2021-11-11 ENCOUNTER — Ambulatory Visit: Payer: BC Managed Care – PPO | Admitting: Gastroenterology

## 2021-11-11 ENCOUNTER — Ambulatory Visit: Payer: BC Managed Care – PPO | Admitting: Surgery

## 2021-11-11 NOTE — Telephone Encounter (Signed)
Patient spouse has a few medical questions. ?

## 2021-11-12 DIAGNOSIS — N186 End stage renal disease: Secondary | ICD-10-CM | POA: Diagnosis not present

## 2021-11-12 DIAGNOSIS — N2581 Secondary hyperparathyroidism of renal origin: Secondary | ICD-10-CM | POA: Diagnosis not present

## 2021-11-12 DIAGNOSIS — Z992 Dependence on renal dialysis: Secondary | ICD-10-CM | POA: Diagnosis not present

## 2021-11-12 NOTE — Telephone Encounter (Signed)
Called patient back and had to leave a voicemail to return my call. ?

## 2021-11-13 ENCOUNTER — Ambulatory Visit: Payer: Self-pay | Admitting: *Deleted

## 2021-11-13 MED ORDER — FAMOTIDINE 20 MG PO TABS: 20.0000 mg | ORAL_TABLET | Freq: Every evening | ORAL | 1 refills | Status: DC

## 2021-11-13 NOTE — Telephone Encounter (Signed)
Called patient back and patient made appointment for 12/16/21 with Dr. Vicente Males because she is due for a follow up appointment. States she needs a refill on Pepcid '20mg'$ . Refilled medication till appointment  ?

## 2021-11-13 NOTE — Addendum Note (Signed)
Addended by: Ulyess Blossom L on: 11/13/2021 10:57 AM ? ? Modules accepted: Orders ? ?

## 2021-11-13 NOTE — Telephone Encounter (Signed)
3rd attempt to reach pt. Routing to practice for PCPs resolution per protocol. ?

## 2021-11-13 NOTE — Telephone Encounter (Signed)
Patient called, left VM to return the call to the office to discuss symptoms with a nurse. ? ? ?Summary: Requesting testing for food allergies  ? Patient called in to ask how she can get a food allergy test to see what causes her to have swelling of her face and not feeling well from time to time. Per patient Dr Army Melia is aware because its been going on for a while and she did not feel like discussing with me the whole story only want to know if there are some test that Dr Army Melia can order to check. Per patient she just got over an attack on 11/12/21  Ph#  [phone]   ?  ? ?

## 2021-11-13 NOTE — Telephone Encounter (Signed)
Patient called in to ask how she can get a food allergy test to see what causes her to have swelling of her face and not feeling well from time to time. Per patient Dr Army Melia is aware because its been going on for a while and she did not feel like discussing with me the whole story only want to know if there are some test that Dr Army Melia can order to check. Per patient she just got over an attack on 11/12/21  Ph#  [phone]  ? ? ?Attempted to reach pt to discuss symptoms. Left VM to call back. ?

## 2021-11-16 DIAGNOSIS — Z992 Dependence on renal dialysis: Secondary | ICD-10-CM | POA: Diagnosis not present

## 2021-11-16 DIAGNOSIS — N2581 Secondary hyperparathyroidism of renal origin: Secondary | ICD-10-CM | POA: Diagnosis not present

## 2021-11-16 DIAGNOSIS — N186 End stage renal disease: Secondary | ICD-10-CM | POA: Diagnosis not present

## 2021-11-19 DIAGNOSIS — Z992 Dependence on renal dialysis: Secondary | ICD-10-CM | POA: Diagnosis not present

## 2021-11-19 DIAGNOSIS — N186 End stage renal disease: Secondary | ICD-10-CM | POA: Diagnosis not present

## 2021-11-19 DIAGNOSIS — N2581 Secondary hyperparathyroidism of renal origin: Secondary | ICD-10-CM | POA: Diagnosis not present

## 2021-11-20 ENCOUNTER — Ambulatory Visit: Payer: BC Managed Care – PPO | Admitting: Gastroenterology

## 2021-11-21 DIAGNOSIS — Z992 Dependence on renal dialysis: Secondary | ICD-10-CM | POA: Diagnosis not present

## 2021-11-21 DIAGNOSIS — N186 End stage renal disease: Secondary | ICD-10-CM | POA: Diagnosis not present

## 2021-11-21 DIAGNOSIS — N2581 Secondary hyperparathyroidism of renal origin: Secondary | ICD-10-CM | POA: Diagnosis not present

## 2021-11-22 DIAGNOSIS — N186 End stage renal disease: Secondary | ICD-10-CM | POA: Diagnosis not present

## 2021-11-22 DIAGNOSIS — Z992 Dependence on renal dialysis: Secondary | ICD-10-CM | POA: Diagnosis not present

## 2021-11-26 DIAGNOSIS — N186 End stage renal disease: Secondary | ICD-10-CM | POA: Diagnosis not present

## 2021-11-26 DIAGNOSIS — Z992 Dependence on renal dialysis: Secondary | ICD-10-CM | POA: Diagnosis not present

## 2021-11-26 DIAGNOSIS — N2581 Secondary hyperparathyroidism of renal origin: Secondary | ICD-10-CM | POA: Diagnosis not present

## 2021-11-28 DIAGNOSIS — G4733 Obstructive sleep apnea (adult) (pediatric): Secondary | ICD-10-CM | POA: Diagnosis not present

## 2021-11-28 DIAGNOSIS — N2581 Secondary hyperparathyroidism of renal origin: Secondary | ICD-10-CM | POA: Diagnosis not present

## 2021-11-28 DIAGNOSIS — Z992 Dependence on renal dialysis: Secondary | ICD-10-CM | POA: Diagnosis not present

## 2021-11-28 DIAGNOSIS — N186 End stage renal disease: Secondary | ICD-10-CM | POA: Diagnosis not present

## 2021-11-30 DIAGNOSIS — N186 End stage renal disease: Secondary | ICD-10-CM | POA: Diagnosis not present

## 2021-11-30 DIAGNOSIS — N2581 Secondary hyperparathyroidism of renal origin: Secondary | ICD-10-CM | POA: Diagnosis not present

## 2021-11-30 DIAGNOSIS — Z992 Dependence on renal dialysis: Secondary | ICD-10-CM | POA: Diagnosis not present

## 2021-12-02 ENCOUNTER — Other Ambulatory Visit (INDEPENDENT_AMBULATORY_CARE_PROVIDER_SITE_OTHER): Payer: Self-pay | Admitting: Nurse Practitioner

## 2021-12-02 DIAGNOSIS — N186 End stage renal disease: Secondary | ICD-10-CM

## 2021-12-03 DIAGNOSIS — N2581 Secondary hyperparathyroidism of renal origin: Secondary | ICD-10-CM | POA: Diagnosis not present

## 2021-12-03 DIAGNOSIS — I10 Essential (primary) hypertension: Secondary | ICD-10-CM | POA: Diagnosis not present

## 2021-12-03 DIAGNOSIS — Z992 Dependence on renal dialysis: Secondary | ICD-10-CM | POA: Diagnosis not present

## 2021-12-03 DIAGNOSIS — N186 End stage renal disease: Secondary | ICD-10-CM | POA: Diagnosis not present

## 2021-12-04 ENCOUNTER — Encounter: Payer: Self-pay | Admitting: Surgery

## 2021-12-04 ENCOUNTER — Other Ambulatory Visit (INDEPENDENT_AMBULATORY_CARE_PROVIDER_SITE_OTHER): Payer: BC Managed Care – PPO

## 2021-12-04 ENCOUNTER — Encounter (INDEPENDENT_AMBULATORY_CARE_PROVIDER_SITE_OTHER): Payer: BC Managed Care – PPO

## 2021-12-04 ENCOUNTER — Encounter: Payer: Self-pay | Admitting: Internal Medicine

## 2021-12-04 ENCOUNTER — Ambulatory Visit (INDEPENDENT_AMBULATORY_CARE_PROVIDER_SITE_OTHER): Payer: BC Managed Care – PPO | Admitting: Surgery

## 2021-12-04 ENCOUNTER — Encounter (INDEPENDENT_AMBULATORY_CARE_PROVIDER_SITE_OTHER): Payer: BC Managed Care – PPO | Admitting: Nurse Practitioner

## 2021-12-04 VITALS — BP 200/100 | HR 83 | Temp 98.3°F | Ht 66.0 in | Wt 160.0 lb

## 2021-12-04 DIAGNOSIS — N186 End stage renal disease: Secondary | ICD-10-CM | POA: Diagnosis not present

## 2021-12-04 DIAGNOSIS — Z992 Dependence on renal dialysis: Secondary | ICD-10-CM

## 2021-12-04 NOTE — H&P (View-Only) (Signed)
Outpatient Surgical Follow Up ? ?12/04/2021 ? ?Kelli [name] is an 61 y.o. female.  ? ?Chief Complaint  ?Patient presents with  ? Follow-up  ? ? ?HPI:  Kelli [name] is a 61 year old female well-known to me with a prior history of peritoneal dialysis placement 6 months and a half ago.  She was unable to tolerate this form of dialysis due to pain when infusing the dialysate  fluid.  She now is receiving hemodialysis on Tuesday Thursday and Fridays.No fevers or chills, no current abd pain. ?He does endorse nausea reflux.  She could not tolerate peritoneal dialysis given the bloating and the discomfort of the abdominal cavity.  Recent lab work showed persistent expected elevated creatinine but potassium is 3.7.  No recent cardiac events ,hemoglobin 10.8.  Globin A1c is 9 ? ?Past Medical History:  ?Diagnosis Date  ? Blood transfusion declined because patient is Jehovah's Witness   ? Cancer [facility])   ? Charcot foot due to diabetes mellitus ([place])   ? Chorioretinal inflammation of both eyes   ? Chronic kidney disease   ? Depression   ? Diabetes mellitus without complication ([place])   ? Dialysis patient [facility])   ? 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 Hyperlipidemia   ? Migraine   ? Neuropathy   ? PUD (peptic ulcer disease)   ? Staph infection   ? ? ?Past Surgical History:  ?Procedure Laterality Date  ? ABDOMINAL HYSTERECTOMY    ? CAPD INSERTION N/A 05/30/2021  ? Procedure: CONTINUOUS AMBULATORY PERITONEAL DIALYSIS  (CAPD) CATHETER INSERTION;  Surgeon: Jules Husbands, MD;  Location: [facility] ORS;  Service: General;  Laterality: N/A;  ? COLONOSCOPY W/ POLYPECTOMY    ? DIALYSIS/PERMA CATHETER INSERTION N/A 05/13/2021  ? Procedure: DIALYSIS/PERMA CATHETER INSERTION;  Surgeon: Algernon Huxley, MD;  Location: [place] CV LAB;  Service: Cardiovascular;  Laterality: N/A;  ? ESOPHAGOGASTRODUODENOSCOPY  03/02/2017  ? Normal  ? HEMORRHOID SURGERY    ? ? ?Family History  ?Problem Relation Age of Onset  ? Heart failure  Mother   ? Diabetes Mother   ? Breast cancer Neg Hx   ? ? ?Social History:  reports that she has quit smoking. She has never been exposed to tobacco smoke. She has never used smokeless tobacco. She reports that she does not drink alcohol and does not use drugs. ? ?Allergies:  ?Allergies  ?Allergen Reactions  ? Statins Nausea And Vomiting  ? Albumin Human Other (See Comments)  ?  Refuses all blood products as one of Jehovah's Witnesses ?  ? Gabapentin Swelling  ? Hydrochlorothiazide Nausea And Vomiting  ? Lisinopril Other (See Comments)  ?  fatigue  ? Lyrica [Pregabalin] Swelling  ? Metformin And Related Nausea And Vomiting  ? Penicillins Nausea And Vomiting  ? ? ?Medications reviewed. ? ? ? ?ROS ?Full ROS performed and is otherwise negative other than what is stated in HPI ? ? ?BP (!) 200/100   Pulse 83   Temp 98.3 ?F (36.8 ?C)   Ht '5\' 6"'$  (1.676 m)   Wt 160 lb (72.6 kg)   SpO2 94%   BMI 25.82 kg/m?  ? ?Physical Exam ? ?Physical Exam ?Vitals and nursing note reviewed. Exam conducted with a chaperone present.  ?Constitutional:   ?   Appearance: Normal appearance. She is normal weight.  ?Cardiovascular:  ?   Rate and Rhythm: Normal rate and regular rhythm.  ?   Heart sounds:  ?  No friction rub.  ?  Pulmonary:  ?   Effort: Pulmonary effort is normal. No respiratory distress.  ?   Breath sounds: No stridor. No wheezing.  ?Abdominal:  ?   General: Abdomen is flat. There is no distension.  ?   Palpations: Abdomen is soft. There is no mass.  ?   Tenderness: There is no abdominal tenderness. There is no guarding or rebound.  ?   Hernia: No hernia is present.  ?   Comments: PD cath in place w/o infection,  ?Musculoskeletal:     ?   General: No swelling or tenderness. Normal range of motion.  ?   Cervical back: Normal range of motion and neck supple. No rigidity or tenderness.  ?Lymphadenopathy:  ?   Cervical: No cervical adenopathy.  ?Skin: ?   General: Skin is warm and dry.  ?   Capillary Refill: Capillary refill takes  less than 2 seconds.  ?Neurological:  ?   General: No focal deficit present.  ?   Mental Status: She is alert and oriented to person, place, and time.  ?Psychiatric:     ?   Mood and Affect: Mood normal.     ?   Behavior: Behavior normal.     ?   Thought Content: Thought content normal.     ?   Judgment: Judgment normal.  ? ?Assessment/Plan: ?61 year old female with end-stage renal disease unable to tolerate peritoneal dialysis.  She wishes to have PD catheter removed.  Procedure discussed with the patient in detail.  Risks, benefits and possible complications including But not limited to: Bleeding, infection, pain.  Possibility of performing diagnostic laparoscopy.  She understands and wished to proceed.  She wishes to have this since possible.  We will schedule her on a nondialysis day. ? ? ?I spent  40 minutes in this encounter including personally reviewing medical records, counseling the patient, placing orders and performing appropriate documentation ?  ? ?Caroleen Hamman, MD FACS ?General Surgeon  ?

## 2021-12-04 NOTE — Progress Notes (Signed)
Outpatient Surgical Follow Up ? ?12/04/2021 ? ?Kelli [name] is an 61 y.o. female.  ? ?Chief Complaint  ?Patient presents with  ? Follow-up  ? ? ?HPI:  Kelli [name] is a 61 year old female well-known to me with a prior history of peritoneal dialysis placement 6 months and a half ago.  She was unable to tolerate this form of dialysis due to pain when infusing the dialysate  fluid.  She now is receiving hemodialysis on Tuesday Thursday and Fridays.No fevers or chills, no current abd pain. ?He does endorse nausea reflux.  She could not tolerate peritoneal dialysis given the bloating and the discomfort of the abdominal cavity.  Recent lab work showed persistent expected elevated creatinine but potassium is 3.7.  No recent cardiac events ,hemoglobin 10.8.  Globin A1c is 9 ? ?Past Medical History:  ?Diagnosis Date  ? Blood transfusion declined because patient is Jehovah's Witness   ? Cancer [facility])   ? Charcot foot due to diabetes mellitus ([place])   ? Chorioretinal inflammation of both eyes   ? Chronic kidney disease   ? Depression   ? Diabetes mellitus without complication ([place])   ? Dialysis patient [facility])   ? 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 Hyperlipidemia   ? Migraine   ? Neuropathy   ? PUD (peptic ulcer disease)   ? Staph infection   ? ? ?Past Surgical History:  ?Procedure Laterality Date  ? ABDOMINAL HYSTERECTOMY    ? CAPD INSERTION N/A 05/30/2021  ? Procedure: CONTINUOUS AMBULATORY PERITONEAL DIALYSIS  (CAPD) CATHETER INSERTION;  Surgeon: Jules Husbands, MD;  Location: [facility] ORS;  Service: General;  Laterality: N/A;  ? COLONOSCOPY W/ POLYPECTOMY    ? DIALYSIS/PERMA CATHETER INSERTION N/A 05/13/2021  ? Procedure: DIALYSIS/PERMA CATHETER INSERTION;  Surgeon: Algernon Huxley, MD;  Location: [place] CV LAB;  Service: Cardiovascular;  Laterality: N/A;  ? ESOPHAGOGASTRODUODENOSCOPY  03/02/2017  ? Normal  ? HEMORRHOID SURGERY    ? ? ?Family History  ?Problem Relation Age of Onset  ? Heart failure  Mother   ? Diabetes Mother   ? Breast cancer Neg Hx   ? ? ?Social History:  reports that she has quit smoking. She has never been exposed to tobacco smoke. She has never used smokeless tobacco. She reports that she does not drink alcohol and does not use drugs. ? ?Allergies:  ?Allergies  ?Allergen Reactions  ? Statins Nausea And Vomiting  ? Albumin Human Other (See Comments)  ?  Refuses all blood products as one of Jehovah's Witnesses ?  ? Gabapentin Swelling  ? Hydrochlorothiazide Nausea And Vomiting  ? Lisinopril Other (See Comments)  ?  fatigue  ? Lyrica [Pregabalin] Swelling  ? Metformin And Related Nausea And Vomiting  ? Penicillins Nausea And Vomiting  ? ? ?Medications reviewed. ? ? ? ?ROS ?Full ROS performed and is otherwise negative other than what is stated in HPI ? ? ?BP (!) 200/100   Pulse 83   Temp 98.3 ?F (36.8 ?C)   Ht '5\' 6"'$  (1.676 m)   Wt 160 lb (72.6 kg)   SpO2 94%   BMI 25.82 kg/m?  ? ?Physical Exam ? ?Physical Exam ?Vitals and nursing note reviewed. Exam conducted with a chaperone present.  ?Constitutional:   ?   Appearance: Normal appearance. She is normal weight.  ?Cardiovascular:  ?   Rate and Rhythm: Normal rate and regular rhythm.  ?   Heart sounds:  ?  No friction rub.  ?  Pulmonary:  ?   Effort: Pulmonary effort is normal. No respiratory distress.  ?   Breath sounds: No stridor. No wheezing.  ?Abdominal:  ?   General: Abdomen is flat. There is no distension.  ?   Palpations: Abdomen is soft. There is no mass.  ?   Tenderness: There is no abdominal tenderness. There is no guarding or rebound.  ?   Hernia: No hernia is present.  ?   Comments: PD cath in place w/o infection,  ?Musculoskeletal:     ?   General: No swelling or tenderness. Normal range of motion.  ?   Cervical back: Normal range of motion and neck supple. No rigidity or tenderness.  ?Lymphadenopathy:  ?   Cervical: No cervical adenopathy.  ?Skin: ?   General: Skin is warm and dry.  ?   Capillary Refill: Capillary refill takes  less than 2 seconds.  ?Neurological:  ?   General: No focal deficit present.  ?   Mental Status: She is alert and oriented to person, place, and time.  ?Psychiatric:     ?   Mood and Affect: Mood normal.     ?   Behavior: Behavior normal.     ?   Thought Content: Thought content normal.     ?   Judgment: Judgment normal.  ? ?Assessment/Plan: ?61 year old female with end-stage renal disease unable to tolerate peritoneal dialysis.  She wishes to have PD catheter removed.  Procedure discussed with the patient in detail.  Risks, benefits and possible complications including But not limited to: Bleeding, infection, pain.  Possibility of performing diagnostic laparoscopy.  She understands and wished to proceed.  She wishes to have this since possible.  We will schedule her on a nondialysis day. ? ? ?I spent  40 minutes in this encounter including personally reviewing medical records, counseling the patient, placing orders and performing appropriate documentation ?  ? ?Caroleen Hamman, MD FACS ?General Surgeon  ?

## 2021-12-04 NOTE — Patient Instructions (Signed)
We spoke with you today about having your PD Catheter removed. ? ?Our surgery scheduler will call you to go over surgery date and information. ? ?This will be done at [facility] by Dr Dahlia Byes.  ? ?Why do I need my PD catheter removed? ?Your PD catheter will be removed for one of the following reasons: ?? Following a successful kidney transplant; ?? Changing your type of dialysis treatment to haemodialysis; ?? If the PD catheter fails to work; ?? In the event of an episode of peritonitis which does not respond to  ?antibiotic treatment; ?? If you develop a tunnel or exit site infection which does not respond to  ?antibiotic treatment. ?Before the operation ?You will get an appointment to see the surgeon and the anaesthetist  ?before your admission to hospital. Some tests may be done at this time  ?such as a heart tracing, a chest x-ray and blood test. Sometimes these  ?tests may be done nearer to the day of your operation. It is likely that at  ?this time they will discuss the two options available for catheter removal.  ?The surgeon will discuss the different methods available as appropriate  ?and will also discuss the benefits and risks involved. You will be asked to  ?sign a form to state that you understand the operation that you are having  ?and that you are willing to have the operation. ?Removal of catheter under general anaesthesia ?When this method is agreed you will be given a general anaesthetic and  ?the surgeon will make a couple of small incisions to remove the catheter.  ?During the operation a small cut is made (about 2 - 3 inches) near the  ?tummy button. The surgeon will carefully detach the cuffs, which hold the  ?catheter in place from the surrounding tissue. When the catheter has been  ?removed the cut will be closed using stiches, staples or special clue. The  ?surgeon will decide what to use to give you the neatest scar. This  ?operation takes around 30 minutes but you will be away from the ward for   ?longer than this as you will wait in theatre for transfer back to the ward.  ?You are likely to require more dressings to the site in the post-operative  ?period.  ?After the operation ?When the operation is over and you are back on the ward you will have  ?your blood pressure, pulse and wound checked regularly by the nurses. If  ?you have had you catheter pulled out without general anaesthesia, you will  ?be able to eat and drink on return to the ward. You will be able to go home  ?after 1 hour.  ?You may feel some discomfort around the site of the operation or in the  ?tummy. Please let the nurses know and they will give you some  ?painkillers. If you have had a general anaesthetic a doctor will need to  ?check that your bowel has not ?gone to sleep? by listening to you tummy  ?with a stethoscope. If they can hear your bowel making noises (which is  ?perfectly normal) then you will be allowed small drinks at first, building up  ?to eating and drinking normally. ?Most people will go home 4 - 6 hours after the operation if they have had a  ?general anaesthetic providing they are eating and drinking normally. ?Following discharge ?If you have had your catheter pulled out you will have a small dry dressing  ?over the old exit site. This  can be changed as necessary by PD nurse,  ?Practice Nurses or yourselves if you are happy to do so. ?

## 2021-12-05 ENCOUNTER — Telehealth: Payer: Self-pay | Admitting: Surgery

## 2021-12-05 ENCOUNTER — Other Ambulatory Visit: Payer: Self-pay | Admitting: Gastroenterology

## 2021-12-05 DIAGNOSIS — N186 End stage renal disease: Secondary | ICD-10-CM | POA: Diagnosis not present

## 2021-12-05 DIAGNOSIS — N2581 Secondary hyperparathyroidism of renal origin: Secondary | ICD-10-CM | POA: Diagnosis not present

## 2021-12-05 DIAGNOSIS — Z992 Dependence on renal dialysis: Secondary | ICD-10-CM | POA: Diagnosis not present

## 2021-12-05 DIAGNOSIS — I10 Essential (primary) hypertension: Secondary | ICD-10-CM | POA: Diagnosis not present

## 2021-12-05 MED ORDER — LACTATED RINGERS IV SOLN: INTRAVENOUS | Status: DC

## 2021-12-05 MED ORDER — CHLORHEXIDINE GLUCONATE CLOTH 2 % EX PADS: 6.0000 | MEDICATED_PAD | Freq: Once | CUTANEOUS | Status: DC

## 2021-12-05 MED ORDER — CEFAZOLIN SODIUM-DEXTROSE 2-4 GM/100ML-% IV SOLN
2.0000 g | INTRAVENOUS | Status: AC
  Administered 2021-12-06: 2 g via INTRAVENOUS

## 2021-12-05 MED ORDER — ORAL CARE MOUTH RINSE: 15.0000 mL | Freq: Once | OROMUCOSAL | Status: DC

## 2021-12-05 MED ORDER — CHLORHEXIDINE GLUCONATE 0.12 % MT SOLN: 15.0000 mL | Freq: Once | OROMUCOSAL | Status: DC

## 2021-12-05 MED ORDER — ACETAMINOPHEN 500 MG PO TABS: 1000.0000 mg | ORAL_TABLET | ORAL | Status: DC

## 2021-12-05 NOTE — Telephone Encounter (Signed)
Patient has been advised of Pre-Admission date/time, COVID Testing date and Surgery date. ? ?Surgery Date: 12/06/21 ?Preadmission Testing Date: 12/06/21, patient to arrive 2 hours early prior to surgery, she is instructed to be at [facility] at 6:30 am and is reminded to be NPO after midnight.   ? ?Covid Testing Date: Not needed.   ?

## 2021-12-06 ENCOUNTER — Other Ambulatory Visit: Payer: Self-pay

## 2021-12-06 ENCOUNTER — Ambulatory Visit
Admission: RE | Admit: 2021-12-06 | Discharge: 2021-12-06 | Disposition: A | Discharge status: Home / Self Care | Payer: BC Managed Care – PPO | Attending: Surgery | Admitting: Surgery

## 2021-12-06 ENCOUNTER — Ambulatory Visit: Payer: BC Managed Care – PPO | Admitting: Anesthesiology

## 2021-12-06 ENCOUNTER — Encounter
Admission: RE | Disposition: A | Discharge status: Home / Self Care | Payer: Self-pay | Source: Home / Self Care | Attending: Surgery

## 2021-12-06 ENCOUNTER — Encounter: Payer: Self-pay | Admitting: Surgery

## 2021-12-06 DIAGNOSIS — Z4902 Encounter for fitting and adjustment of peritoneal dialysis catheter: Secondary | ICD-10-CM | POA: Diagnosis not present

## 2021-12-06 DIAGNOSIS — Z955 Presence of coronary angioplasty implant and graft: Secondary | ICD-10-CM

## 2021-12-06 DIAGNOSIS — Z992 Dependence on renal dialysis: Secondary | ICD-10-CM

## 2021-12-06 DIAGNOSIS — I2510 Atherosclerotic heart disease of native coronary artery without angina pectoris: Secondary | ICD-10-CM | POA: Insufficient documentation

## 2021-12-06 DIAGNOSIS — R9431 Abnormal electrocardiogram [ECG] [EKG]: Secondary | ICD-10-CM | POA: Diagnosis not present

## 2021-12-06 DIAGNOSIS — E1022 Type 1 diabetes mellitus with diabetic chronic kidney disease: Secondary | ICD-10-CM | POA: Diagnosis not present

## 2021-12-06 DIAGNOSIS — Z87891 Personal history of nicotine dependence: Secondary | ICD-10-CM | POA: Diagnosis not present

## 2021-12-06 DIAGNOSIS — I120 Hypertensive chronic kidney disease with stage 5 chronic kidney disease or end stage renal disease: Secondary | ICD-10-CM | POA: Insufficient documentation

## 2021-12-06 DIAGNOSIS — E1122 Type 2 diabetes mellitus with diabetic chronic kidney disease: Secondary | ICD-10-CM | POA: Diagnosis not present

## 2021-12-06 DIAGNOSIS — Z4901 Encounter for fitting and adjustment of extracorporeal dialysis catheter: Secondary | ICD-10-CM

## 2021-12-06 DIAGNOSIS — N186 End stage renal disease: Secondary | ICD-10-CM | POA: Diagnosis not present

## 2021-12-06 DIAGNOSIS — E1140 Type 2 diabetes mellitus with diabetic neuropathy, unspecified: Secondary | ICD-10-CM | POA: Diagnosis not present

## 2021-12-06 HISTORY — PX: CAPD REMOVAL: SHX5234

## 2021-12-06 LAB — COMPREHENSIVE METABOLIC PANEL
ALT: 14 U/L (ref 0–44)
AST: 20 U/L (ref 15–41)
Albumin: 2.9 g/dL — ABNORMAL LOW (ref 3.5–5.0)
Alkaline Phosphatase: 63 U/L (ref 38–126)
Anion gap: 8 (ref 5–15)
BUN: 19 mg/dL (ref 8–23)
CO2: 27 mmol/L (ref 22–32)
Calcium: 7.5 mg/dL — ABNORMAL LOW (ref 8.9–10.3)
Chloride: 102 mmol/L (ref 98–111)
Creatinine, Ser: 4.3 mg/dL — ABNORMAL HIGH (ref 0.44–1.00)
GFR, Estimated: 11 mL/min — ABNORMAL LOW (ref >60)
Glucose, Bld: 257 mg/dL — ABNORMAL HIGH (ref 70–99)
Potassium: 3.6 mmol/L (ref 3.5–5.1)
Sodium: 137 mmol/L (ref 135–145)
Total Bilirubin: 0.8 mg/dL (ref 0.3–1.2)
Total Protein: 5.4 g/dL — ABNORMAL LOW (ref 6.5–8.1)

## 2021-12-06 LAB — GLUCOSE, CAPILLARY: Glucose-Capillary: 243 mg/dL — ABNORMAL HIGH (ref 70–99)

## 2021-12-06 LAB — CBC
HCT: 36.1 % (ref 36.0–46.0)
Hemoglobin: 11.4 g/dL — ABNORMAL LOW (ref 12.0–15.0)
MCH: 29.4 pg (ref 26.0–34.0)
MCHC: 31.6 g/dL (ref 30.0–36.0)
MCV: 93 fL (ref 80.0–100.0)
Platelets: 260 10*3/uL (ref 150–400)
RBC: 3.88 MIL/uL (ref 3.87–5.11)
RDW: 16.1 % — ABNORMAL HIGH (ref 11.5–15.5)
WBC: 6.7 10*3/uL (ref 4.0–10.5)
nRBC: 0 % (ref 0.0–0.2)

## 2021-12-06 LAB — POCT I-STAT, CHEM 8
BUN: 17 mg/dL (ref 8–23)
Calcium, Ion: 1 mmol/L — ABNORMAL LOW (ref 1.15–1.40)
Chloride: 101 mmol/L (ref 98–111)
Creatinine, Ser: 4.8 mg/dL — ABNORMAL HIGH (ref 0.44–1.00)
Glucose, Bld: 247 mg/dL — ABNORMAL HIGH (ref 70–99)
HCT: 34 % — ABNORMAL LOW (ref 36.0–46.0)
Hemoglobin: 11.6 g/dL — ABNORMAL LOW (ref 12.0–15.0)
Potassium: 3.6 mmol/L (ref 3.5–5.1)
Sodium: 137 mmol/L (ref 135–145)
TCO2: 27 mmol/L (ref 22–32)

## 2021-12-06 SURGERY — LAPAROSCOPIC REMOVAL CONTINUOUS AMBULATORY PERITONEAL DIALYSIS  (CAPD) CATHETER
Anesthesia: General | Site: Abdomen

## 2021-12-06 MED ORDER — FENTANYL CITRATE (PF) 100 MCG/2ML IJ SOLN
INTRAMUSCULAR | Status: AC
  Filled 2021-12-06: qty 2

## 2021-12-06 MED ORDER — 0.9 % SODIUM CHLORIDE (POUR BTL) OPTIME
TOPICAL | Status: DC | PRN
  Administered 2021-12-06: 500 mL

## 2021-12-06 MED ORDER — FENTANYL CITRATE (PF) 100 MCG/2ML IJ SOLN
INTRAMUSCULAR | Status: DC | PRN
  Administered 2021-12-06 (×2): 50 ug via INTRAVENOUS

## 2021-12-06 MED ORDER — LIDOCAINE HCL (CARDIAC) PF 100 MG/5ML IV SOSY
PREFILLED_SYRINGE | INTRAVENOUS | Status: DC | PRN
  Administered 2021-12-06: 50 mg via INTRAVENOUS

## 2021-12-06 MED ORDER — BUPIVACAINE-EPINEPHRINE (PF) 0.25% -1:200000 IJ SOLN
INTRAMUSCULAR | Status: AC
  Filled 2021-12-06: qty 30

## 2021-12-06 MED ORDER — ONDANSETRON HCL 4 MG/2ML IJ SOLN
INTRAMUSCULAR | Status: DC | PRN
  Administered 2021-12-06: 4 mg via INTRAVENOUS

## 2021-12-06 MED ORDER — SODIUM CHLORIDE 0.9 % IV SOLN: INTRAVENOUS | Status: DC

## 2021-12-06 MED ORDER — CEFAZOLIN SODIUM-DEXTROSE 2-4 GM/100ML-% IV SOLN
INTRAVENOUS | Status: AC
  Filled 2021-12-06: qty 100

## 2021-12-06 MED ORDER — PROPOFOL 500 MG/50ML IV EMUL
INTRAVENOUS | Status: AC
  Filled 2021-12-06: qty 50

## 2021-12-06 MED ORDER — MENTHOL 3 MG MT LOZG
1.0000 | LOZENGE | OROMUCOSAL | Status: DC | PRN
  Administered 2021-12-06: 3 mg via ORAL
  Filled 2021-12-06 (×2): qty 9

## 2021-12-06 MED ORDER — FENTANYL CITRATE (PF) 100 MCG/2ML IJ SOLN: 25.0000 ug | INTRAMUSCULAR | Status: DC | PRN

## 2021-12-06 MED ORDER — SUCCINYLCHOLINE CHLORIDE 200 MG/10ML IV SOSY
PREFILLED_SYRINGE | INTRAVENOUS | Status: DC | PRN
  Administered 2021-12-06: 80 mg via INTRAVENOUS

## 2021-12-06 MED ORDER — CHLORHEXIDINE GLUCONATE 0.12 % MT SOLN
OROMUCOSAL | Status: AC
  Filled 2021-12-06: qty 15

## 2021-12-06 MED ORDER — BUPIVACAINE-EPINEPHRINE 0.25% -1:200000 IJ SOLN
INTRAMUSCULAR | Status: DC | PRN
  Administered 2021-12-06: 40 mL

## 2021-12-06 MED ORDER — PROPOFOL 10 MG/ML IV BOLUS
INTRAVENOUS | Status: DC | PRN
  Administered 2021-12-06: 120 mg via INTRAVENOUS

## 2021-12-06 MED ORDER — HYDROCODONE-ACETAMINOPHEN 5-325 MG PO TABS: 1.0000 | ORAL_TABLET | Freq: Four times a day (QID) | ORAL | 0 refills | Status: DC | PRN

## 2021-12-06 MED ORDER — BUPIVACAINE LIPOSOME 1.3 % IJ SUSP
INTRAMUSCULAR | Status: AC
  Filled 2021-12-06: qty 20

## 2021-12-06 MED ORDER — DEXMEDETOMIDINE HCL IN NACL 200 MCG/50ML IV SOLN
INTRAVENOUS | Status: DC | PRN
  Administered 2021-12-06: 12 ug via INTRAVENOUS

## 2021-12-06 MED ORDER — ONDANSETRON HCL 4 MG/2ML IJ SOLN
4.0000 mg | Freq: Once | INTRAMUSCULAR | Status: AC | PRN
  Administered 2021-12-06: 4 mg via INTRAVENOUS

## 2021-12-06 MED ORDER — ACETAMINOPHEN 500 MG PO TABS
ORAL_TABLET | ORAL | Status: AC
  Filled 2021-12-06: qty 2

## 2021-12-06 MED ORDER — SODIUM CHLORIDE 0.9 % IV SOLN: INTRAVENOUS | Status: DC | PRN

## 2021-12-06 MED ORDER — ONDANSETRON HCL 4 MG/2ML IJ SOLN
INTRAMUSCULAR | Status: AC
  Filled 2021-12-06: qty 2

## 2021-12-06 SURGICAL SUPPLY — 43 items
ADH SKN CLS APL DERMABOND .7 (GAUZE/BANDAGES/DRESSINGS) ×2
APL PRP STRL LF DISP 70% ISPRP (MISCELLANEOUS) ×1
BLADE CLIPPER SURG (BLADE) IMPLANT
CHLORAPREP W/TINT 26 (MISCELLANEOUS) ×2 IMPLANT
DEFOGGER SCOPE WARMER CLEARIFY (MISCELLANEOUS) ×1 IMPLANT
DERMABOND ADVANCED (GAUZE/BANDAGES/DRESSINGS) ×2
DERMABOND ADVANCED .7 DNX12 (GAUZE/BANDAGES/DRESSINGS) ×1 IMPLANT
ELECT CAUTERY BLADE 6.4 (BLADE) ×2 IMPLANT
ELECT REM PT RETURN 9FT ADLT (ELECTROSURGICAL) ×2
ELECTRODE REM PT RTRN 9FT ADLT (ELECTROSURGICAL) ×1 IMPLANT
GLOVE BIO SURGEON STRL SZ7 (GLOVE) ×2 IMPLANT
GOWN STRL REUS W/ TWL LRG LVL3 (GOWN DISPOSABLE) ×2 IMPLANT
GOWN STRL REUS W/TWL LRG LVL3 (GOWN DISPOSABLE) ×4
GRASPER SUT TROCAR 14GX15 (MISCELLANEOUS) ×2 IMPLANT
KIT TURNOVER KIT A (KITS) ×2 IMPLANT
MANIFOLD NEPTUNE II (INSTRUMENTS) ×2 IMPLANT
NDL INSUFFLATION 14GA 120MM (NEEDLE) ×1 IMPLANT
NDL SAFETY ECLIPSE 18X1.5 (NEEDLE) ×1 IMPLANT
NEEDLE HYPO 18GX1.5 SHARP (NEEDLE) ×2
NEEDLE HYPO 22GX1.5 SAFETY (NEEDLE) ×2 IMPLANT
NEEDLE INSUFFLATION 14GA 120MM (NEEDLE) ×2 IMPLANT
NS IRRIG 500ML POUR BTL (IV SOLUTION) ×2 IMPLANT
PACK LAP CHOLECYSTECTOMY (MISCELLANEOUS) ×2 IMPLANT
PENCIL ELECTRO HAND CTR (MISCELLANEOUS) ×2 IMPLANT
PENCIL SMOKE EVACUATOR (MISCELLANEOUS) ×1 IMPLANT
SET TUBE SMOKE EVAC HIGH FLOW (TUBING) ×1 IMPLANT
SLEEVE ADV FIXATION 5X100MM (TROCAR) ×2 IMPLANT
SPONGE T-LAP 18X18 ~~LOC~~+RFID (SPONGE) ×2 IMPLANT
SUT ETHILON 3-0 FS-10 30 BLK (SUTURE)
SUT MNCRL 4-0 (SUTURE) ×4
SUT MNCRL 4-0 27XMFL (SUTURE) ×2
SUT VIC AB 3-0 SH 27 (SUTURE) ×2
SUT VIC AB 3-0 SH 27X BRD (SUTURE) ×1 IMPLANT
SUT VICRYL 0 AB UR-6 (SUTURE) ×3 IMPLANT
SUTURE EHLN 3-0 FS-10 30 BLK (SUTURE) IMPLANT
SUTURE MNCRL 4-0 27XMF (SUTURE) ×1 IMPLANT
SYR 20ML LL LF (SYRINGE) ×2 IMPLANT
SYR 3ML LL SCALE MARK (SYRINGE) ×2 IMPLANT
SYS KII FIOS ACCESS ABD 5X100 (TROCAR) ×2
SYSTEM KII FIOS ACES ABD 5X100 (TROCAR) ×1 IMPLANT
TROCAR BALLN GELPORT 12X130M (ENDOMECHANICALS) ×1 IMPLANT
TROCAR XCEL NON-BLD 5MMX100MML (ENDOMECHANICALS) ×1 IMPLANT
WATER STERILE IRR 500ML POUR (IV SOLUTION) ×2 IMPLANT

## 2021-12-06 NOTE — Anesthesia Preprocedure Evaluation (Signed)
Anesthesia Evaluation  ?Patient identified by MRN, date of birth, ID band ?Patient awake ? ? ? ?Reviewed: ?Allergy & Precautions, H&P , NPO status , Patient's Chart, lab work & pertinent test results, reviewed documented beta blocker date and time  ? ?Airway ?Mallampati: II ? ?TM Distance: >3 FB ?Neck ROM: full ? ? ? Dental ? ?(+) Teeth Intact ?  ?Pulmonary ?neg pulmonary ROS, former smoker,  ?  ?Pulmonary exam normal ? ? ? ? ? ? ? Cardiovascular ?Exercise Tolerance: Poor ?hypertension, On Medications ?+ CAD  ?Normal cardiovascular exam ?Rhythm:regular Rate:Normal ? ? ?  ?Neuro/Psych ? Headaches, PSYCHIATRIC DISORDERS Depression  Neuromuscular disease   ? GI/Hepatic ?Neg liver ROS, PUD,   ?Endo/Other  ?negative endocrine ROSdiabetes, Poorly Controlled, Type 1, Insulin Dependent ? Renal/GU ?ESRF and DialysisRenal disease  ?negative genitourinary ?  ?Musculoskeletal ? ? Abdominal ?  ?Peds ? 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    Comment:  Procedure: CONTINUOUS AMBULATORY PERITONEAL DIALYSIS   ?             (CAPD) CATHETER INSERTION;  Surgeon: Jules Husbands, MD;  ?             Location: [facility] ORS;  Service: General;  Laterality: N/A; ?No date: COLONOSCOPY W/ POLYPECTOMY ?05/13/2021: DIALYSIS/PERMA  CATHETER INSERTION; N/A ?    Comment:  Procedure: DIALYSIS/PERMA CATHETER INSERTION;  Surgeon:  ?             Algernon Huxley, MD;  Location: [place] CV LAB;   ?             Service: Cardiovascular;  Laterality: N/A; ?03/02/2017: ESOPHAGOGASTRODUODENOSCOPY ?    Comment:  Normal ?No date: HEMORRHOID SURGERY ?BMI   ? Body Mass Index: 25.82 kg/m?  ?  ? Reproductive/Obstetrics ?negative OB ROS ? ?  ? ? ? ? ? ? ? ? ? ? ? ? ? ?  ?  ? ? ? ? ? ? ? ? ?Anesthesia Physical ?Anesthesia Plan ? ?ASA: 4 ? ?Anesthesia Plan: General ETT  ? ?Post-op Pain Management:   ? ?Induction:  ? ?PONV Risk Score and Plan:  ? ?Airway Management Planned:  ? ?Additional Equipment:  ? ?Intra-op Plan:  ? ?Post-operative Plan:  ? ?Informed Consent: I have reviewed the patients History and Physical, chart, labs and discussed the procedure including the risks, benefits and alternatives for the proposed anesthesia with the patient or authorized representative who has indicated his/her understanding and acceptance.  ? ? ? ?Dental Advisory Given ? ?Plan Discussed with: CRNA ? ?Anesthesia Plan Comments:   ? ? ? ? ? ? ?Anesthesia Quick Evaluation ? ?

## 2021-12-06 NOTE — Progress Notes (Signed)
Patient's BP 184/83 and she came in with a BP 209/94. Dr Andree Elk is okay with current BP. ?

## 2021-12-06 NOTE — Brief Op Note (Signed)
Dr. Andree Elk aware of BS of 247 and has reviewed EKG. He reports he has contacted Dr. Rockey Situ to view EKG and he reports it is okay to proceed with surgery. ?

## 2021-12-06 NOTE — Op Note (Signed)
Removal PD catheter three cuffs ?  ?Pre-operative Diagnosis: ESRD, unable to tolerate PD ?  ?Post-operative Diagnosis: same ?  ?  ?Surgeon: Caroleen Hamman, MD FACS ?  ?Anesthesia: Gen. with endotracheal tube ?  ?   ?Findings: ?No evidence of catheter infection ? ?Estimated Blood Loss: 5cc ?       ?      ?Complications: none ?  ?  ?Procedure Details  ?The patient was seen again in the Holding Room. The benefits, complications, treatment options, and expected outcomes were discussed with the patient. The risks of bleeding, infection, recurrence of symptoms, failure to resolve symptoms,  bowel injury, any of which could require further surgery were reviewed with the patient. The likelihood of improving the patient's symptoms with return to their baseline status is good.  The patient and/or family concurred with the proposed plan, giving informed consent.  The patient was taken to Operating Room, identified and the procedure verified. A Time Out was held and the above information confirmed. ?  ?Prior to the induction of general anesthesia, antibiotic prophylaxis was administered. VTE prophylaxis was in place. General endotracheal anesthesia was then administered and tolerated well. After the induction, the abdomen was prepped with Chloraprep and draped in the sterile fashion. The patient was positioned in the supine position. ?  ?Periumbilical incision created to the left of the midline.   The anterior rectus fascia identified and incised,  the cuff removed. 0 Vicryl stitch was sued to closed the anterior fascia. Attention was placed on the LUQ the additional cuffs were identified and incision created over them. Sub q tissue dissected and the two cuffs were free up from sub q tissue. We were able to pull the cathter in its totality. All pieces were accounted for. Liposomal marcaine infiltrated on all incisions sites. ? ?4-0 subcuticular Monocryl was used to close the skin. Dermabond was  applied. The patient was then  extubated and brought to the recovery room in stable condition. Sponge, lap, and needle counts were correct at closure and at the conclusion of the case.  ?        ?     ?Caroleen Hamman, MD, FACS ?  ?   ?

## 2021-12-06 NOTE — Discharge Instructions (Signed)
AMBULATORY SURGERY  ?DISCHARGE INSTRUCTIONS ? ? ?The drugs that you were given will stay in your system until tomorrow so for the next 24 hours you should not: ? ?Drive an automobile ?Make any legal decisions ?Drink any alcoholic beverage ? ? ?You may resume regular meals tomorrow.  Today it is better to start with liquids and gradually work up to solid foods. ? ?You may eat anything you prefer, but it is better to start with liquids, then soup and crackers, and gradually work up to solid foods. ? ? ?Please notify your doctor immediately if you have any unusual bleeding, trouble breathing, redness and pain at the surgery site, drainage, fever, or pain not relieved by medication. ? ? ? ?Additional Instructions: ? ? ? ?Please contact your physician with any problems or Same Day Surgery at [phone], Monday through Friday 6 am to 4 pm, or [place] at [place] [facility] number at [phone].  ?

## 2021-12-06 NOTE — Anesthesia Procedure Notes (Signed)
Procedure Name: Intubation ?Date/Time: 12/06/2021 9:38 AM ?Performed by: Beverely Low, CRNA ?Pre-anesthesia Checklist: Patient identified, Patient being monitored, Timeout performed, Emergency Drugs available and Suction available ?Patient Re-evaluated:Patient Re-evaluated prior to induction ?Oxygen Delivery Method: Circle system utilized ?Preoxygenation: Pre-oxygenation with 100% oxygen ?Induction Type: IV induction ?Ventilation: Mask ventilation without difficulty ?Laryngoscope Size: 3 and McGraph ?Grade View: Grade I ?Tube type: Oral ?Tube size: 7.0 mm ?Number of attempts: 1 ?Airway Equipment and Method: Stylet ?Placement Confirmation: ETT inserted through vocal cords under direct vision, positive ETCO2 and breath sounds checked- equal and bilateral ?Secured at: 21 cm ?Tube secured with: Tape ?Dental Injury: Teeth and Oropharynx as per pre-operative assessment  ? ? ? ? ?

## 2021-12-06 NOTE — Interval H&P Note (Signed)
History and Physical Interval Note: ? ?12/06/2021 ?8:53 AM ? ?Kelli [name]  has presented today for surgery, with the diagnosis of ESRD.  The various methods of treatment have been discussed with the patient and family. After consideration of risks, benefits and other options for treatment, the patient has consented to  Procedure(s): ?LAPAROSCOPIC REMOVAL CONTINUOUS AMBULATORY PERITONEAL DIALYSIS  (CAPD) CATHETER (N/A) as a surgical intervention.  The patient's history has been reviewed, patient examined, no change in status, stable for surgery.  I have reviewed the patient's chart and labs.  Questions were answered to the patient's satisfaction.   ? ? ?[place] ? ? ?

## 2021-12-06 NOTE — Transfer of Care (Signed)
Immediate Anesthesia Transfer of Care Note ? ?Patient: Kelli [name] ? ?Procedure(s) Performed: REMOVAL OF CONTINUOUS AMBULATORY PERITONEAL DIALYSIS  (CAPD) CATHETER (Abdomen) ? ?Patient Location: PACU ? ?Anesthesia Type:General ? ?Level of Consciousness: drowsy ? ?Airway & Oxygen Therapy: Patient Spontanous Breathing and Patient connected to face mask oxygen ? ?Post-op Assessment: Report given to RN and Post -op Vital signs reviewed and stable ? ?Post vital signs: Reviewed and stable ? ?Last Vitals:  ?Vitals Value Taken Time  ?BP    ?Temp    ?Pulse    ?Resp    ?SpO2    ? ? ?Last Pain:  ?Vitals:  ? 12/06/21 0744  ?TempSrc: Oral  ?PainSc: 0-No pain  ?   ? ?  ? ?Complications: No notable events documented. ?

## 2021-12-07 ENCOUNTER — Other Ambulatory Visit: Payer: Self-pay | Admitting: Internal Medicine

## 2021-12-07 DIAGNOSIS — Z992 Dependence on renal dialysis: Secondary | ICD-10-CM | POA: Diagnosis not present

## 2021-12-07 DIAGNOSIS — N2581 Secondary hyperparathyroidism of renal origin: Secondary | ICD-10-CM | POA: Diagnosis not present

## 2021-12-07 DIAGNOSIS — N186 End stage renal disease: Secondary | ICD-10-CM | POA: Diagnosis not present

## 2021-12-07 DIAGNOSIS — I10 Essential (primary) hypertension: Secondary | ICD-10-CM | POA: Diagnosis not present

## 2021-12-09 LAB — SURGICAL PATHOLOGY

## 2021-12-09 NOTE — Telephone Encounter (Signed)
Requested Prescriptions  ?Pending Prescriptions Disp Refills  ?? Continuous Blood Gluc Sensor (FREESTYLE LIBRE 2 SENSOR) MISC [Pharmacy Med Name: FREESTYLE LIBRE 2 SENSOR]  3  ?  Sig: USE AS DIRECTED EVERY 14 DAYS  ?  ? Endocrinology: Diabetes - Testing Supplies Passed - 12/07/2021  5:01 PM  ?  ?  Passed - Valid encounter within last 12 months  ?  Recent Outpatient Visits   ?      ? 4 months ago Sleep disorder breathing  ? [facility] Glean Hess, MD  ? 8 months ago Annual physical exam  ? [facility] Glean Hess, MD  ? 9 months ago Acute cystitis without hematuria  ? [facility] Glean Hess, MD  ? 10 months ago Dysuria  ? [facility] Montel Culver, MD  ? 11 months ago Acute cystitis without hematuria  ? [facility] Glean Hess, MD  ?  ?  ?Future Appointments   ?        ? In 1 week Jonathon Bellows, MD [place]  ?  ? ?  ?  ?  ? ? ?

## 2021-12-10 DIAGNOSIS — N186 End stage renal disease: Secondary | ICD-10-CM | POA: Diagnosis not present

## 2021-12-10 DIAGNOSIS — Z992 Dependence on renal dialysis: Secondary | ICD-10-CM | POA: Diagnosis not present

## 2021-12-10 DIAGNOSIS — N2581 Secondary hyperparathyroidism of renal origin: Secondary | ICD-10-CM | POA: Diagnosis not present

## 2021-12-10 DIAGNOSIS — I10 Essential (primary) hypertension: Secondary | ICD-10-CM | POA: Diagnosis not present

## 2021-12-10 NOTE — Anesthesia Postprocedure Evaluation (Signed)
Anesthesia Post Note ? ?Patient: Kelli [name] ? ?Procedure(s) Performed: REMOVAL OF CONTINUOUS AMBULATORY PERITONEAL DIALYSIS  (CAPD) CATHETER (Abdomen) ? ?Patient location during evaluation: PACU ?Anesthesia Type: General ?Level of consciousness: awake and alert ?Pain management: pain level controlled ?Vital Signs Assessment: post-procedure vital signs reviewed and stable ?Respiratory status: spontaneous breathing, nonlabored ventilation, respiratory function stable and patient connected to nasal cannula oxygen ?Cardiovascular status: blood pressure returned to baseline and stable ?Postop Assessment: no apparent nausea or vomiting ?Anesthetic complications: no ? ? ?No notable events documented. ? ? ?Last Vitals:  ?Vitals:  ? 12/06/21 1117 12/06/21 1136  ?BP: (!) 184/83 (!) 183/82  ?Pulse: 73 78  ?Resp: 19 18  ?Temp:  36.7 ?C  ?SpO2: (!) 88% 93%  ?  ?Last Pain:  ?Vitals:  ? 12/06/21 1136  ?TempSrc: Oral  ?PainSc: 0-No pain  ? ? ?  ?  ?  ?  ?  ?  ? ?Molli Barrows ? ? ? ? ?

## 2021-12-11 DIAGNOSIS — I2510 Atherosclerotic heart disease of native coronary artery without angina pectoris: Secondary | ICD-10-CM | POA: Diagnosis not present

## 2021-12-11 LAB — LIPID PANEL
Cholesterol: 218 — AB (ref 0–200)
HDL: 109 — AB (ref 35–70)
LDL Cholesterol: 84
Triglycerides: 126 (ref 40–160)

## 2021-12-11 LAB — BASIC METABOLIC PANEL
BUN: 19 (ref 4–21)
CO2: 27 — AB (ref 13–22)
Chloride: 102 (ref 99–108)
Creatinine: 4 — AB (ref 0.5–1.1)
Potassium: 4.2 mEq/L (ref 3.5–5.1)
Sodium: 137 (ref 137–147)

## 2021-12-11 LAB — CBC AND DIFFERENTIAL
HCT: 36 (ref 36–46)
Hemoglobin: 11.4 — AB (ref 12.0–16.0)
Platelets: 260 10*3/uL (ref 150–400)
WBC: 6.7

## 2021-12-11 LAB — HEPATIC FUNCTION PANEL
ALT: 14 U/L (ref 7–35)
AST: 20 (ref 13–35)
Alkaline Phosphatase: 63 (ref 25–125)

## 2021-12-11 LAB — COMPREHENSIVE METABOLIC PANEL
Calcium: 7.5 — AB (ref 8.7–10.7)
eGFR: 11

## 2021-12-12 DIAGNOSIS — I10 Essential (primary) hypertension: Secondary | ICD-10-CM | POA: Diagnosis not present

## 2021-12-12 DIAGNOSIS — Z992 Dependence on renal dialysis: Secondary | ICD-10-CM | POA: Diagnosis not present

## 2021-12-12 DIAGNOSIS — N2581 Secondary hyperparathyroidism of renal origin: Secondary | ICD-10-CM | POA: Diagnosis not present

## 2021-12-12 DIAGNOSIS — N186 End stage renal disease: Secondary | ICD-10-CM | POA: Diagnosis not present

## 2021-12-13 ENCOUNTER — Other Ambulatory Visit: Payer: Self-pay | Admitting: Internal Medicine

## 2021-12-13 DIAGNOSIS — I10 Essential (primary) hypertension: Secondary | ICD-10-CM

## 2021-12-13 NOTE — Telephone Encounter (Signed)
Requested Prescriptions  ?Pending Prescriptions Disp Refills  ?? metoprolol tartrate (LOPRESSOR) 25 MG tablet [Pharmacy Med Name: METOPROLOL TARTRATE 25 MG TAB] 180 tablet 0  ?  Sig: TAKE 1 TABLET BY MOUTH TWICE A DAY  ?  ? Cardiovascular:  Beta Blockers Failed - 12/13/2021  2:25 AM  ?  ?  Failed - Last BP in normal range  ?  BP Readings from Last 1 Encounters:  ?12/06/21 (!) 183/82  ?   ?  ?  Passed - Last Heart Rate in normal range  ?  Pulse Readings from Last 1 Encounters:  ?12/06/21 78  ?   ?  ?  Passed - Valid encounter within last 6 months  ?  Recent Outpatient Visits   ?      ? 4 months ago Sleep disorder breathing  ? [facility] Glean Hess, MD  ? 8 months ago Annual physical exam  ? [facility] Glean Hess, MD  ? 10 months ago Acute cystitis without hematuria  ? [facility] Glean Hess, MD  ? 10 months ago Dysuria  ? [facility] Montel Culver, MD  ? 11 months ago Acute cystitis without hematuria  ? [facility] Glean Hess, MD  ?  ?  ?Future Appointments   ?        ? In 3 days Jonathon Bellows, MD [place]  ?  ? ?  ?  ?  ? ? ?

## 2021-12-14 DIAGNOSIS — N2581 Secondary hyperparathyroidism of renal origin: Secondary | ICD-10-CM | POA: Diagnosis not present

## 2021-12-14 DIAGNOSIS — Z992 Dependence on renal dialysis: Secondary | ICD-10-CM | POA: Diagnosis not present

## 2021-12-14 DIAGNOSIS — N186 End stage renal disease: Secondary | ICD-10-CM | POA: Diagnosis not present

## 2021-12-14 DIAGNOSIS — I10 Essential (primary) hypertension: Secondary | ICD-10-CM | POA: Diagnosis not present

## 2021-12-16 ENCOUNTER — Ambulatory Visit: Payer: BC Managed Care – PPO | Admitting: Gastroenterology

## 2021-12-16 ENCOUNTER — Other Ambulatory Visit: Payer: Self-pay

## 2021-12-16 NOTE — Progress Notes (Deleted)
Jonathon Bellows MD, MRCP(U.K) [address]  [place]  [place], [place] [address]  Main: [phone]  Fax: [phone]   Primary Care Physician: Glean Hess, MD  Primary Gastroenterologist:  Dr. Jonathon Bellows   No chief complaint on file.   HPI: Kelli [name] is a 61 y.o. female   Summary of history :  Previously seen Dr Bonna Gains back in 07/2021 for GERD .At that time was having change in bowel habits.   Gastric emptying study was normal  Previously seen by [facility] history of recurrent episodes of nausea vomiting, with history of normal gastric emptying study in 2017.  They also state that patient had an endoscopy in 2018 that was essentially normal aside from bilious fluid.  Upper endoscopy on May 22, 2021 that showed LA grade C reflux esophagitis, with biopsies done for EOE.  Normal stomach and duodenum.    She was prescribed Prilosec and states this was making her symptoms worse and so she stopped taking it.   Reports symptoms ongoing for at least 2 to 3 years, with frequent nausea and vomiting.  Denies any drug use or marijuana use.  Occurs with or without meals.   Denies any abdominal pain.  Denies postprandial pain.  Has history of CKD and sees transplant clinic at [facility] as well.   Interval history 08/21/2021-12/16/2021    ***   Current Outpatient Medications  Medication Sig Dispense Refill   acetaminophen (TYLENOL) 500 MG tablet Take 1 tablet by mouth every 8 (eight) hours as needed.     aspirin EC 81 MG tablet Take 81 mg by mouth daily. Swallow whole.     BD PEN NEEDLE NANO U/F 32G X 4 MM MISC Inject 1 each into the skin 3 (three) times daily.     brimonidine (ALPHAGAN) 0.2 % ophthalmic solution Place 1 drop into the left eye 2 (two) times daily.     clopidogrel (PLAVIX) 75 MG tablet Take 75 mg by mouth daily.     Continuous Blood Gluc Receiver (FREESTYLE LIBRE 14 DAY READER) DEVI See admin instructions.     Continuous Blood Gluc Sensor (FREESTYLE  LIBRE 2 SENSOR) MISC USE AS DIRECTED EVERY 14 DAYS 2 each 3   dorzolamide-timolol (COSOPT) 22.3-6.8 MG/ML ophthalmic solution SMARTSIG:1 Drop(s) Left Eye Every 12 Hours     Epoetin Alfa (EPOGEN IJ) Epoetin Alfa (Epogen)     escitalopram (LEXAPRO) 5 MG tablet TAKE 1 TABLET (5 MG TOTAL) BY MOUTH DAILY. 90 tablet 0   famotidine (PEPCID) 20 MG tablet TAKE 1 TABLET BY MOUTH EVERY DAY IN THE EVENING (Patient not taking: Reported on 12/06/2021) 30 tablet 0   glucose blood (FREESTYLE LITE) test strip 1 each by Other route five (5) times a day.     HYDROcodone-acetaminophen (NORCO/VICODIN) 5-325 MG tablet Take 1 tablet by mouth every 6 (six) hours as needed for moderate pain. 15 tablet 0   insulin glargine (LANTUS SOLOSTAR) 100 UNIT/ML Solostar Pen Inject 16 Units into the skin daily.     insulin lispro (HUMALOG) 100 UNIT/ML injection Inject 4-8 Units into the skin 4 (four) times daily as needed for high blood sugar. SSI     latanoprost (XALATAN) 0.005 % ophthalmic solution Place 1 drop into the left eye at bedtime.     metoprolol tartrate (LOPRESSOR) 25 MG tablet TAKE 1 TABLET BY MOUTH TWICE A DAY 180 tablet 0   mirtazapine (REMERON) 7.5 MG tablet Take 7.5 mg by mouth at bedtime.  nitroGLYCERIN (NITROSTAT) 0.4 MG SL tablet Place under the tongue.     pantoprazole (PROTONIX) 40 MG tablet Take 40 mg by mouth daily. (Patient not taking: Reported on 12/06/2021)     promethazine (PHENERGAN) 12.5 MG tablet Take by mouth.     rosuvastatin (CRESTOR) 10 MG tablet Take 10 mg by mouth daily.     No current facility-administered medications for this visit.    Allergies as of 12/16/2021 - Review Complete 12/06/2021  Allergen Reaction Noted   Statins Nausea And Vomiting 09/17/2020   Albumin human Other (See Comments) 04/19/2021   Gabapentin Swelling 09/22/2016   Hydrochlorothiazide Nausea And Vomiting 06/13/2019   Lisinopril Other (See Comments) 08/26/2016   Lyrica [pregabalin] Swelling 09/22/2016   Metformin  and related Nausea And Vomiting 09/22/2016   Penicillins Nausea And Vomiting 09/03/2015    ROS:  General: Negative for anorexia, weight loss, fever, chills, fatigue, weakness. ENT: Negative for hoarseness, difficulty swallowing , nasal congestion. CV: Negative for chest pain, angina, palpitations, dyspnea on exertion, peripheral edema.  Respiratory: Negative for dyspnea at rest, dyspnea on exertion, cough, sputum, wheezing.  GI: See history of present illness. GU:  Negative for dysuria, hematuria, urinary incontinence, urinary frequency, nocturnal urination.  Endo: Negative for unusual weight change.    Physical Examination:   There were no vitals taken for this visit.  General: Well-nourished, well-developed in no acute distress.  Eyes: No icterus. Conjunctivae pink. Mouth: Oropharyngeal mucosa moist and pink , no lesions erythema or exudate. Lungs: Clear to auscultation bilaterally. Non-labored. Heart: Regular rate and rhythm, no murmurs rubs or gallops.  Abdomen: Bowel sounds are normal, nontender, nondistended, no hepatosplenomegaly or masses, no abdominal bruits or hernia , no rebound or guarding.   Extremities: No lower extremity edema. No clubbing or deformities. Neuro: Alert and oriented x 3.  Grossly intact. Skin: Warm and dry, no jaundice.   Psych: Alert and cooperative, normal mood and affect.   Imaging Studies: No results found.  Assessment and Plan:   Kelli [name] is a 61 y.o. y/o female with a history of GERD/Esophagitis - didn't have repeat EGD , cancelled last procedure in 2023. Colonoscopy for microscopic colitis evaluation  was recommended due to history of diarrhea and she also cancelled the procedure.  Plan      Dr Jonathon Bellows  MD,MRCP [facility]) Follow up in ***

## 2021-12-17 DIAGNOSIS — N186 End stage renal disease: Secondary | ICD-10-CM | POA: Diagnosis not present

## 2021-12-17 DIAGNOSIS — N2581 Secondary hyperparathyroidism of renal origin: Secondary | ICD-10-CM | POA: Diagnosis not present

## 2021-12-17 DIAGNOSIS — Z992 Dependence on renal dialysis: Secondary | ICD-10-CM | POA: Diagnosis not present

## 2021-12-19 ENCOUNTER — Encounter: Payer: Self-pay | Admitting: Physician Assistant

## 2021-12-19 ENCOUNTER — Other Ambulatory Visit: Payer: Self-pay

## 2021-12-19 ENCOUNTER — Ambulatory Visit (INDEPENDENT_AMBULATORY_CARE_PROVIDER_SITE_OTHER): Payer: BC Managed Care – PPO | Admitting: Physician Assistant

## 2021-12-19 VITALS — BP 200/100 | HR 71 | Temp 98.3°F | Ht 66.0 in | Wt 155.0 lb

## 2021-12-19 DIAGNOSIS — N186 End stage renal disease: Secondary | ICD-10-CM

## 2021-12-19 DIAGNOSIS — Z09 Encounter for follow-up examination after completed treatment for conditions other than malignant neoplasm: Secondary | ICD-10-CM | POA: Diagnosis not present

## 2021-12-19 DIAGNOSIS — Z992 Dependence on renal dialysis: Secondary | ICD-10-CM | POA: Diagnosis not present

## 2021-12-19 DIAGNOSIS — N2581 Secondary hyperparathyroidism of renal origin: Secondary | ICD-10-CM | POA: Diagnosis not present

## 2021-12-19 NOTE — Progress Notes (Signed)
[facility] ?POST-OP OFFICE VISIT ? ?12/19/2021 ? ?HPI: ?Kelli [name] is a 61 y.o. female 13 days s/p removal of PD catheter with Dr Dahlia Byes ? ?In regards to the PD removal, she is doing well. She had soreness for the first few days but this is improving. Chronic nausea. No fever, chills. No oissues with incisions.  ? ?Of note, she was markedly hypertensive in the the office today. BP 200/100 initially. She states that it runs >476 systolic at home all the time. She checks this daily. She is working with her cardiologist and nephrologist on this. On chart review, it appears she has been this hypertensive in the past. She denied any HA, vision changes. I did recheck this before leaving and she was 160/80 ? ?Vital signs: ?There were no vitals taken for this visit.  ? ?Physical Exam: ?Constitutional: Well appearing female, NAD ?Abdomen: Soft, non-tender, non-distended, no rebound/guarding ?Skin: Laparoscopic incisions are healing well, no erythema or drainage  ? ?Assessment/Plan: ?This is a 61 y.o. female  removal of PD catheter with Dr Dahlia Byes ? ? - Pain control prn ? - Reviewed wound care recommendation ? - Reviewed lifting restrictions; 4 weeks total ? - She can follow up on as needed basis; She understands to call with questions/concerns ? ?-- ?Edison Simon, PA-C ?[facility] ?12/19/2021, 3:22 PM ?M-F: 7am - 4pm ? ?

## 2021-12-19 NOTE — Patient Instructions (Signed)

## 2021-12-21 DIAGNOSIS — N2581 Secondary hyperparathyroidism of renal origin: Secondary | ICD-10-CM | POA: Diagnosis not present

## 2021-12-21 DIAGNOSIS — Z992 Dependence on renal dialysis: Secondary | ICD-10-CM | POA: Diagnosis not present

## 2021-12-21 DIAGNOSIS — N186 End stage renal disease: Secondary | ICD-10-CM | POA: Diagnosis not present

## 2021-12-22 DIAGNOSIS — Z992 Dependence on renal dialysis: Secondary | ICD-10-CM | POA: Diagnosis not present

## 2021-12-22 DIAGNOSIS — N186 End stage renal disease: Secondary | ICD-10-CM | POA: Diagnosis not present

## 2021-12-24 DIAGNOSIS — Z992 Dependence on renal dialysis: Secondary | ICD-10-CM | POA: Diagnosis not present

## 2021-12-24 DIAGNOSIS — N186 End stage renal disease: Secondary | ICD-10-CM | POA: Diagnosis not present

## 2021-12-24 DIAGNOSIS — N2581 Secondary hyperparathyroidism of renal origin: Secondary | ICD-10-CM | POA: Diagnosis not present

## 2021-12-26 DIAGNOSIS — N186 End stage renal disease: Secondary | ICD-10-CM | POA: Diagnosis not present

## 2021-12-26 DIAGNOSIS — Z992 Dependence on renal dialysis: Secondary | ICD-10-CM | POA: Diagnosis not present

## 2021-12-26 DIAGNOSIS — N2581 Secondary hyperparathyroidism of renal origin: Secondary | ICD-10-CM | POA: Diagnosis not present

## 2021-12-28 ENCOUNTER — Other Ambulatory Visit: Payer: Self-pay | Admitting: Gastroenterology

## 2021-12-28 DIAGNOSIS — Z992 Dependence on renal dialysis: Secondary | ICD-10-CM | POA: Diagnosis not present

## 2021-12-28 DIAGNOSIS — N186 End stage renal disease: Secondary | ICD-10-CM | POA: Diagnosis not present

## 2021-12-28 DIAGNOSIS — N2581 Secondary hyperparathyroidism of renal origin: Secondary | ICD-10-CM | POA: Diagnosis not present

## 2021-12-31 DIAGNOSIS — N2581 Secondary hyperparathyroidism of renal origin: Secondary | ICD-10-CM | POA: Diagnosis not present

## 2021-12-31 DIAGNOSIS — E113513 Type 2 diabetes mellitus with proliferative diabetic retinopathy with macular edema, bilateral: Secondary | ICD-10-CM | POA: Diagnosis not present

## 2021-12-31 DIAGNOSIS — Z992 Dependence on renal dialysis: Secondary | ICD-10-CM | POA: Diagnosis not present

## 2021-12-31 DIAGNOSIS — N186 End stage renal disease: Secondary | ICD-10-CM | POA: Diagnosis not present

## 2022-01-01 DIAGNOSIS — R112 Nausea with vomiting, unspecified: Secondary | ICD-10-CM | POA: Diagnosis not present

## 2022-01-01 DIAGNOSIS — K2100 Gastro-esophageal reflux disease with esophagitis, without bleeding: Secondary | ICD-10-CM | POA: Diagnosis not present

## 2022-01-02 DIAGNOSIS — Z992 Dependence on renal dialysis: Secondary | ICD-10-CM | POA: Diagnosis not present

## 2022-01-02 DIAGNOSIS — N2581 Secondary hyperparathyroidism of renal origin: Secondary | ICD-10-CM | POA: Diagnosis not present

## 2022-01-02 DIAGNOSIS — N186 End stage renal disease: Secondary | ICD-10-CM | POA: Diagnosis not present

## 2022-01-04 DIAGNOSIS — I5189 Other ill-defined heart diseases: Secondary | ICD-10-CM

## 2022-01-04 DIAGNOSIS — Z992 Dependence on renal dialysis: Secondary | ICD-10-CM | POA: Diagnosis not present

## 2022-01-04 DIAGNOSIS — N2581 Secondary hyperparathyroidism of renal origin: Secondary | ICD-10-CM | POA: Diagnosis not present

## 2022-01-04 DIAGNOSIS — N186 End stage renal disease: Secondary | ICD-10-CM | POA: Diagnosis not present

## 2022-01-04 HISTORY — DX: Other ill-defined heart diseases: I51.89

## 2022-01-06 DIAGNOSIS — I2510 Atherosclerotic heart disease of native coronary artery without angina pectoris: Secondary | ICD-10-CM | POA: Diagnosis not present

## 2022-01-07 DIAGNOSIS — N2581 Secondary hyperparathyroidism of renal origin: Secondary | ICD-10-CM | POA: Diagnosis not present

## 2022-01-07 DIAGNOSIS — N186 End stage renal disease: Secondary | ICD-10-CM | POA: Diagnosis not present

## 2022-01-07 DIAGNOSIS — Z992 Dependence on renal dialysis: Secondary | ICD-10-CM | POA: Diagnosis not present

## 2022-01-09 DIAGNOSIS — N186 End stage renal disease: Secondary | ICD-10-CM | POA: Diagnosis not present

## 2022-01-09 DIAGNOSIS — Z992 Dependence on renal dialysis: Secondary | ICD-10-CM | POA: Diagnosis not present

## 2022-01-09 DIAGNOSIS — N2581 Secondary hyperparathyroidism of renal origin: Secondary | ICD-10-CM | POA: Diagnosis not present

## 2022-01-11 DIAGNOSIS — N186 End stage renal disease: Secondary | ICD-10-CM | POA: Diagnosis not present

## 2022-01-11 DIAGNOSIS — Z992 Dependence on renal dialysis: Secondary | ICD-10-CM | POA: Diagnosis not present

## 2022-01-11 DIAGNOSIS — N2581 Secondary hyperparathyroidism of renal origin: Secondary | ICD-10-CM | POA: Diagnosis not present

## 2022-01-14 DIAGNOSIS — N186 End stage renal disease: Secondary | ICD-10-CM | POA: Diagnosis not present

## 2022-01-14 DIAGNOSIS — N2581 Secondary hyperparathyroidism of renal origin: Secondary | ICD-10-CM | POA: Diagnosis not present

## 2022-01-14 DIAGNOSIS — Z992 Dependence on renal dialysis: Secondary | ICD-10-CM | POA: Diagnosis not present

## 2022-01-15 ENCOUNTER — Encounter (INDEPENDENT_AMBULATORY_CARE_PROVIDER_SITE_OTHER): Payer: BC Managed Care – PPO

## 2022-01-15 ENCOUNTER — Encounter (INDEPENDENT_AMBULATORY_CARE_PROVIDER_SITE_OTHER): Payer: Medicare Other | Admitting: Nurse Practitioner

## 2022-01-15 ENCOUNTER — Other Ambulatory Visit (INDEPENDENT_AMBULATORY_CARE_PROVIDER_SITE_OTHER): Payer: Medicare Other

## 2022-01-16 DIAGNOSIS — N2581 Secondary hyperparathyroidism of renal origin: Secondary | ICD-10-CM | POA: Diagnosis not present

## 2022-01-16 DIAGNOSIS — Z992 Dependence on renal dialysis: Secondary | ICD-10-CM | POA: Diagnosis not present

## 2022-01-16 DIAGNOSIS — N186 End stage renal disease: Secondary | ICD-10-CM | POA: Diagnosis not present

## 2022-01-18 DIAGNOSIS — Z992 Dependence on renal dialysis: Secondary | ICD-10-CM | POA: Diagnosis not present

## 2022-01-18 DIAGNOSIS — N186 End stage renal disease: Secondary | ICD-10-CM | POA: Diagnosis not present

## 2022-01-18 DIAGNOSIS — N2581 Secondary hyperparathyroidism of renal origin: Secondary | ICD-10-CM | POA: Diagnosis not present

## 2022-01-21 DIAGNOSIS — Z992 Dependence on renal dialysis: Secondary | ICD-10-CM | POA: Diagnosis not present

## 2022-01-21 DIAGNOSIS — N186 End stage renal disease: Secondary | ICD-10-CM | POA: Diagnosis not present

## 2022-01-21 DIAGNOSIS — N2581 Secondary hyperparathyroidism of renal origin: Secondary | ICD-10-CM | POA: Diagnosis not present

## 2022-01-22 DIAGNOSIS — Z992 Dependence on renal dialysis: Secondary | ICD-10-CM | POA: Diagnosis not present

## 2022-01-22 DIAGNOSIS — N186 End stage renal disease: Secondary | ICD-10-CM | POA: Diagnosis not present

## 2022-01-23 ENCOUNTER — Other Ambulatory Visit: Payer: Self-pay | Admitting: Gastroenterology

## 2022-01-23 DIAGNOSIS — N186 End stage renal disease: Secondary | ICD-10-CM | POA: Diagnosis not present

## 2022-01-23 DIAGNOSIS — Z992 Dependence on renal dialysis: Secondary | ICD-10-CM | POA: Diagnosis not present

## 2022-01-23 DIAGNOSIS — N2581 Secondary hyperparathyroidism of renal origin: Secondary | ICD-10-CM | POA: Diagnosis not present

## 2022-01-24 ENCOUNTER — Other Ambulatory Visit: Payer: Self-pay | Admitting: Internal Medicine

## 2022-01-24 NOTE — Telephone Encounter (Signed)
Requested medication (s) are due for refill today: yes  Requested medication (s) are on the active medication list: no  Last refill:  10/22/21  Future visit scheduled: no  Notes to clinic:  rx was discontinued on 12/16/2021. Please assess for refill.      Requested Prescriptions  Pending Prescriptions Disp Refills   atorvastatin (LIPITOR) 40 MG tablet [Pharmacy Med Name: ATORVASTATIN 40 MG TABLET] 90 tablet 1    Sig: TAKE 1 TABLET (40 MG TOTAL) BY MOUTH ONCE DAILY FOR 30 DAYS     Cardiovascular:  Antilipid - Statins Failed - 01/24/2022  2:40 AM      Failed - Lipid Panel in normal range within the last 12 months    Cholesterol, Total  Date Value Ref Range Status  04/10/2021 259 (H) 100 - 199 mg/dL Final   LDL Chol Calc (NIH)  Date Value Ref Range Status  04/10/2021 168 (H) 0 - 99 mg/dL Final   HDL  Date Value Ref Range Status  04/10/2021 62 >39 mg/dL Final   Triglycerides  Date Value Ref Range Status  04/10/2021 162 (H) 0 - 149 mg/dL Final         Passed - Patient is not pregnant      Passed - Valid encounter within last 12 months    Recent Outpatient Visits           5 months ago Sleep disorder breathing   [facility] Glean Hess, MD   9 months ago Annual physical exam   [facility] Glean Hess, MD   11 months ago Acute cystitis without hematuria   [facility] Glean Hess, MD   12 months ago [facility] Montel Culver, MD   1 year ago Acute cystitis without hematuria   [facility] Glean Hess, MD

## 2022-01-25 DIAGNOSIS — N186 End stage renal disease: Secondary | ICD-10-CM | POA: Diagnosis not present

## 2022-01-25 DIAGNOSIS — N2581 Secondary hyperparathyroidism of renal origin: Secondary | ICD-10-CM | POA: Diagnosis not present

## 2022-01-25 DIAGNOSIS — Z992 Dependence on renal dialysis: Secondary | ICD-10-CM | POA: Diagnosis not present

## 2022-01-27 ENCOUNTER — Other Ambulatory Visit: Payer: Self-pay | Admitting: Internal Medicine

## 2022-01-27 DIAGNOSIS — F331 Major depressive disorder, recurrent, moderate: Secondary | ICD-10-CM

## 2022-01-28 DIAGNOSIS — N2581 Secondary hyperparathyroidism of renal origin: Secondary | ICD-10-CM | POA: Diagnosis not present

## 2022-01-28 DIAGNOSIS — N186 End stage renal disease: Secondary | ICD-10-CM | POA: Diagnosis not present

## 2022-01-28 DIAGNOSIS — Z992 Dependence on renal dialysis: Secondary | ICD-10-CM | POA: Diagnosis not present

## 2022-01-28 NOTE — Telephone Encounter (Signed)
Requested Prescriptions  Pending Prescriptions Disp Refills  . escitalopram (LEXAPRO) 5 MG tablet [Pharmacy Med Name: ESCITALOPRAM 5 MG TABLET] 90 tablet 0    Sig: TAKE 1 TABLET (5 MG TOTAL) BY MOUTH DAILY.     Psychiatry:  Antidepressants - SSRI Passed - 01/27/2022  1:58 AM      Passed - Completed PHQ-2 or PHQ-9 in the last 360 days      Passed - Valid encounter within last 6 months    Recent Outpatient Visits          5 months ago Sleep disorder breathing   [facility] Glean Hess, MD   9 months ago Annual physical exam   [facility] Glean Hess, MD   11 months ago Acute cystitis without hematuria   [facility] Glean Hess, MD   1 year ago [facility] Montel Culver, MD   1 year ago Acute cystitis without hematuria   [facility] Glean Hess, MD

## 2022-01-30 DIAGNOSIS — N2581 Secondary hyperparathyroidism of renal origin: Secondary | ICD-10-CM | POA: Diagnosis not present

## 2022-01-30 DIAGNOSIS — Z992 Dependence on renal dialysis: Secondary | ICD-10-CM | POA: Diagnosis not present

## 2022-01-30 DIAGNOSIS — N186 End stage renal disease: Secondary | ICD-10-CM | POA: Diagnosis not present

## 2022-02-01 DIAGNOSIS — N2581 Secondary hyperparathyroidism of renal origin: Secondary | ICD-10-CM | POA: Diagnosis not present

## 2022-02-01 DIAGNOSIS — Z992 Dependence on renal dialysis: Secondary | ICD-10-CM | POA: Diagnosis not present

## 2022-02-01 DIAGNOSIS — N186 End stage renal disease: Secondary | ICD-10-CM | POA: Diagnosis not present

## 2022-02-04 ENCOUNTER — Telehealth: Payer: Self-pay | Admitting: Internal Medicine

## 2022-02-04 DIAGNOSIS — N2581 Secondary hyperparathyroidism of renal origin: Secondary | ICD-10-CM | POA: Diagnosis not present

## 2022-02-04 DIAGNOSIS — N186 End stage renal disease: Secondary | ICD-10-CM | POA: Diagnosis not present

## 2022-02-04 DIAGNOSIS — Z992 Dependence on renal dialysis: Secondary | ICD-10-CM | POA: Diagnosis not present

## 2022-02-04 NOTE — Telephone Encounter (Unsigned)
Copied from [place] [phone]. Topic: General - Other >> Feb 04, 2022  1:23 PM Everette C wrote: Reason for CRM: Medication Refill - Medication: Continuous Blood Gluc Receiver (FREESTYLE LIBRE [place]) DEVI [[id number]]   Continuous Blood Gluc Sensor (FREESTYLE LIBRE 2 SENSOR) MISC [[id number]]    Has the patient contacted their pharmacy? Yes.  The patient was directed to contact their PCP (Agent: If no, request that the patient contact the pharmacy for the refill. If patient does not wish to contact the pharmacy document the reason why and proceed with request.) (Agent: If yes, when and what did the pharmacy advise?)  Preferred Pharmacy (with phone number or street name): [id number] [id number]: [phone]: [phone]: Not open 24 hours   Has the patient been seen for an appointment in the last year OR does the patient have an upcoming appointment? Yes.    Agent: Please be advised that RX refills may take up to 3 business days. We ask that you follow-up with your pharmacy.

## 2022-02-06 ENCOUNTER — Other Ambulatory Visit: Payer: Self-pay | Admitting: Internal Medicine

## 2022-02-06 DIAGNOSIS — N2581 Secondary hyperparathyroidism of renal origin: Secondary | ICD-10-CM | POA: Diagnosis not present

## 2022-02-06 DIAGNOSIS — N186 End stage renal disease: Secondary | ICD-10-CM | POA: Diagnosis not present

## 2022-02-06 DIAGNOSIS — Z992 Dependence on renal dialysis: Secondary | ICD-10-CM | POA: Diagnosis not present

## 2022-02-06 NOTE — Telephone Encounter (Signed)
Requested medication (s) are due for refill today: For review  Requested medication (s) are on the active medication list: yes    Last refill: 05/30/20   Future visit scheduled No  Notes to clinic:Historical Provider  Requested Prescriptions  Pending Prescriptions Disp Refills   Continuous Blood Gluc Receiver (FREESTYLE LIBRE 2 READER) [place] [Pharmacy Med Name: FREESTYLE LIBRE 2 READER] 1 each     Sig: USE AS DIRECTED     Endocrinology: Diabetes - Testing Supplies Passed - 02/06/2022 10:46 AM      Passed - Valid encounter within last 12 months    Recent Outpatient Visits           6 months ago Sleep disorder breathing   [facility] Glean Hess, MD   10 months ago Annual physical exam   [facility] Glean Hess, MD   11 months ago Acute cystitis without hematuria   [facility] Glean Hess, MD   1 year ago [facility] Montel Culver, MD   1 year ago Acute cystitis without hematuria   [facility] Glean Hess, MD

## 2022-02-08 DIAGNOSIS — Z992 Dependence on renal dialysis: Secondary | ICD-10-CM | POA: Diagnosis not present

## 2022-02-08 DIAGNOSIS — N2581 Secondary hyperparathyroidism of renal origin: Secondary | ICD-10-CM | POA: Diagnosis not present

## 2022-02-08 DIAGNOSIS — N186 End stage renal disease: Secondary | ICD-10-CM | POA: Diagnosis not present

## 2022-02-11 DIAGNOSIS — I10 Essential (primary) hypertension: Secondary | ICD-10-CM | POA: Diagnosis not present

## 2022-02-11 DIAGNOSIS — N186 End stage renal disease: Secondary | ICD-10-CM | POA: Diagnosis not present

## 2022-02-11 DIAGNOSIS — N2581 Secondary hyperparathyroidism of renal origin: Secondary | ICD-10-CM | POA: Diagnosis not present

## 2022-02-11 DIAGNOSIS — Z992 Dependence on renal dialysis: Secondary | ICD-10-CM | POA: Diagnosis not present

## 2022-02-13 DIAGNOSIS — I10 Essential (primary) hypertension: Secondary | ICD-10-CM | POA: Diagnosis not present

## 2022-02-13 DIAGNOSIS — N2581 Secondary hyperparathyroidism of renal origin: Secondary | ICD-10-CM | POA: Diagnosis not present

## 2022-02-13 DIAGNOSIS — Z992 Dependence on renal dialysis: Secondary | ICD-10-CM | POA: Diagnosis not present

## 2022-02-13 DIAGNOSIS — N186 End stage renal disease: Secondary | ICD-10-CM | POA: Diagnosis not present

## 2022-02-15 DIAGNOSIS — N2581 Secondary hyperparathyroidism of renal origin: Secondary | ICD-10-CM | POA: Diagnosis not present

## 2022-02-15 DIAGNOSIS — N186 End stage renal disease: Secondary | ICD-10-CM | POA: Diagnosis not present

## 2022-02-15 DIAGNOSIS — Z992 Dependence on renal dialysis: Secondary | ICD-10-CM | POA: Diagnosis not present

## 2022-02-15 DIAGNOSIS — I10 Essential (primary) hypertension: Secondary | ICD-10-CM | POA: Diagnosis not present

## 2022-02-18 DIAGNOSIS — I10 Essential (primary) hypertension: Secondary | ICD-10-CM | POA: Diagnosis not present

## 2022-02-18 DIAGNOSIS — N2581 Secondary hyperparathyroidism of renal origin: Secondary | ICD-10-CM | POA: Diagnosis not present

## 2022-02-18 DIAGNOSIS — Z992 Dependence on renal dialysis: Secondary | ICD-10-CM | POA: Diagnosis not present

## 2022-02-18 DIAGNOSIS — N186 End stage renal disease: Secondary | ICD-10-CM | POA: Diagnosis not present

## 2022-02-20 DIAGNOSIS — Z992 Dependence on renal dialysis: Secondary | ICD-10-CM | POA: Diagnosis not present

## 2022-02-20 DIAGNOSIS — N186 End stage renal disease: Secondary | ICD-10-CM | POA: Diagnosis not present

## 2022-02-20 DIAGNOSIS — I10 Essential (primary) hypertension: Secondary | ICD-10-CM | POA: Diagnosis not present

## 2022-02-20 DIAGNOSIS — N2581 Secondary hyperparathyroidism of renal origin: Secondary | ICD-10-CM | POA: Diagnosis not present

## 2022-02-21 DIAGNOSIS — Z992 Dependence on renal dialysis: Secondary | ICD-10-CM | POA: Diagnosis not present

## 2022-02-21 DIAGNOSIS — N186 End stage renal disease: Secondary | ICD-10-CM | POA: Diagnosis not present

## 2022-02-22 DIAGNOSIS — N186 End stage renal disease: Secondary | ICD-10-CM | POA: Diagnosis not present

## 2022-02-22 DIAGNOSIS — N2581 Secondary hyperparathyroidism of renal origin: Secondary | ICD-10-CM | POA: Diagnosis not present

## 2022-02-22 DIAGNOSIS — Z992 Dependence on renal dialysis: Secondary | ICD-10-CM | POA: Diagnosis not present

## 2022-02-25 DIAGNOSIS — N186 End stage renal disease: Secondary | ICD-10-CM | POA: Diagnosis not present

## 2022-02-25 DIAGNOSIS — Z992 Dependence on renal dialysis: Secondary | ICD-10-CM | POA: Diagnosis not present

## 2022-02-25 DIAGNOSIS — I10 Essential (primary) hypertension: Secondary | ICD-10-CM | POA: Diagnosis not present

## 2022-02-25 DIAGNOSIS — N2581 Secondary hyperparathyroidism of renal origin: Secondary | ICD-10-CM | POA: Diagnosis not present

## 2022-02-26 ENCOUNTER — Telehealth (INDEPENDENT_AMBULATORY_CARE_PROVIDER_SITE_OTHER): Payer: Self-pay | Admitting: Nurse Practitioner

## 2022-02-26 ENCOUNTER — Encounter (INDEPENDENT_AMBULATORY_CARE_PROVIDER_SITE_OTHER): Payer: BC Managed Care – PPO | Admitting: Nurse Practitioner

## 2022-02-26 ENCOUNTER — Ambulatory Visit (INDEPENDENT_AMBULATORY_CARE_PROVIDER_SITE_OTHER): Payer: BC Managed Care – PPO

## 2022-02-26 ENCOUNTER — Encounter (INDEPENDENT_AMBULATORY_CARE_PROVIDER_SITE_OTHER): Payer: Self-pay

## 2022-02-26 ENCOUNTER — Encounter (INDEPENDENT_AMBULATORY_CARE_PROVIDER_SITE_OTHER): Payer: Self-pay | Admitting: Nurse Practitioner

## 2022-02-26 DIAGNOSIS — N186 End stage renal disease: Secondary | ICD-10-CM | POA: Diagnosis not present

## 2022-02-26 NOTE — Telephone Encounter (Signed)
Patient left without being seen by provider. Patient will not be able to get [place] results until seen in office. Patient she wasn't feeling good prior to coming in and wasn't feeling good in the exam room.  I will call the patient on tomorrow to resch her appt    Office manger and provider made aware of what is going on also

## 2022-02-27 DIAGNOSIS — Z992 Dependence on renal dialysis: Secondary | ICD-10-CM | POA: Diagnosis not present

## 2022-02-27 DIAGNOSIS — I10 Essential (primary) hypertension: Secondary | ICD-10-CM | POA: Diagnosis not present

## 2022-02-27 DIAGNOSIS — N186 End stage renal disease: Secondary | ICD-10-CM | POA: Diagnosis not present

## 2022-02-27 DIAGNOSIS — N2581 Secondary hyperparathyroidism of renal origin: Secondary | ICD-10-CM | POA: Diagnosis not present

## 2022-03-01 DIAGNOSIS — Z992 Dependence on renal dialysis: Secondary | ICD-10-CM | POA: Diagnosis not present

## 2022-03-01 DIAGNOSIS — I10 Essential (primary) hypertension: Secondary | ICD-10-CM | POA: Diagnosis not present

## 2022-03-01 DIAGNOSIS — N186 End stage renal disease: Secondary | ICD-10-CM | POA: Diagnosis not present

## 2022-03-01 DIAGNOSIS — N2581 Secondary hyperparathyroidism of renal origin: Secondary | ICD-10-CM | POA: Diagnosis not present

## 2022-03-02 ENCOUNTER — Other Ambulatory Visit: Payer: Self-pay | Admitting: Internal Medicine

## 2022-03-03 NOTE — Telephone Encounter (Signed)
Requested Prescriptions  Pending Prescriptions Disp Refills  . Continuous Blood Gluc Sensor (FREESTYLE LIBRE 2 SENSOR) MISC [Pharmacy Med Name: FREESTYLE LIBRE 2 SENSOR] 2 each 3    Sig: USE AS DIRECTED EVERY 14 DAYS     Endocrinology: Diabetes - Testing Supplies Passed - 03/02/2022 11:19 AM      Passed - Valid encounter within last 12 months    Recent Outpatient Visits          6 months ago Sleep disorder breathing   [facility] Glean Hess, MD   10 months ago Annual physical exam   [facility] Glean Hess, MD   1 year ago Acute cystitis without hematuria   [facility] Glean Hess, MD   1 year ago [facility] Montel Culver, MD   1 year ago Acute cystitis without hematuria   [facility] Glean Hess, MD

## 2022-03-04 DIAGNOSIS — N186 End stage renal disease: Secondary | ICD-10-CM | POA: Diagnosis not present

## 2022-03-04 DIAGNOSIS — I10 Essential (primary) hypertension: Secondary | ICD-10-CM | POA: Diagnosis not present

## 2022-03-04 DIAGNOSIS — N2581 Secondary hyperparathyroidism of renal origin: Secondary | ICD-10-CM | POA: Diagnosis not present

## 2022-03-04 DIAGNOSIS — Z992 Dependence on renal dialysis: Secondary | ICD-10-CM | POA: Diagnosis not present

## 2022-03-06 DIAGNOSIS — I10 Essential (primary) hypertension: Secondary | ICD-10-CM | POA: Diagnosis not present

## 2022-03-06 DIAGNOSIS — Z992 Dependence on renal dialysis: Secondary | ICD-10-CM | POA: Diagnosis not present

## 2022-03-06 DIAGNOSIS — N186 End stage renal disease: Secondary | ICD-10-CM | POA: Diagnosis not present

## 2022-03-06 DIAGNOSIS — N2581 Secondary hyperparathyroidism of renal origin: Secondary | ICD-10-CM | POA: Diagnosis not present

## 2022-03-08 DIAGNOSIS — N186 End stage renal disease: Secondary | ICD-10-CM | POA: Diagnosis not present

## 2022-03-08 DIAGNOSIS — Z992 Dependence on renal dialysis: Secondary | ICD-10-CM | POA: Diagnosis not present

## 2022-03-08 DIAGNOSIS — N2581 Secondary hyperparathyroidism of renal origin: Secondary | ICD-10-CM | POA: Diagnosis not present

## 2022-03-08 DIAGNOSIS — I10 Essential (primary) hypertension: Secondary | ICD-10-CM | POA: Diagnosis not present

## 2022-03-09 ENCOUNTER — Other Ambulatory Visit: Payer: Self-pay | Admitting: Internal Medicine

## 2022-03-09 DIAGNOSIS — I10 Essential (primary) hypertension: Secondary | ICD-10-CM

## 2022-03-10 ENCOUNTER — Encounter (INDEPENDENT_AMBULATORY_CARE_PROVIDER_SITE_OTHER): Payer: Self-pay | Admitting: Nurse Practitioner

## 2022-03-10 ENCOUNTER — Ambulatory Visit (INDEPENDENT_AMBULATORY_CARE_PROVIDER_SITE_OTHER): Payer: BC Managed Care – PPO | Admitting: Nurse Practitioner

## 2022-03-10 VITALS — BP 193/86 | HR 71 | Resp 17 | Ht 66.0 in | Wt 160.0 lb

## 2022-03-10 DIAGNOSIS — N186 End stage renal disease: Secondary | ICD-10-CM | POA: Diagnosis not present

## 2022-03-11 DIAGNOSIS — I10 Essential (primary) hypertension: Secondary | ICD-10-CM | POA: Diagnosis not present

## 2022-03-11 DIAGNOSIS — N2581 Secondary hyperparathyroidism of renal origin: Secondary | ICD-10-CM | POA: Diagnosis not present

## 2022-03-11 DIAGNOSIS — Z992 Dependence on renal dialysis: Secondary | ICD-10-CM | POA: Diagnosis not present

## 2022-03-11 DIAGNOSIS — N186 End stage renal disease: Secondary | ICD-10-CM | POA: Diagnosis not present

## 2022-03-11 NOTE — Telephone Encounter (Signed)
Requested Prescriptions  Pending Prescriptions Disp Refills  . metoprolol tartrate (LOPRESSOR) 25 MG tablet [Pharmacy Med Name: METOPROLOL TARTRATE 25 MG TAB] 60 tablet 0    Sig: TAKE 1 TABLET BY MOUTH TWICE A DAY     Cardiovascular:  Beta Blockers Failed - 03/09/2022 10:21 AM      Failed - Last BP in normal range    BP Readings from Last 1 Encounters:  03/10/22 (!) 193/86         Failed - Valid encounter within last 6 months    Recent Outpatient Visits          7 months ago Sleep disorder breathing   [facility] Glean Hess, MD   11 months ago Annual physical exam   [facility] Glean Hess, MD   1 year ago Acute cystitis without hematuria   [facility] Glean Hess, MD   1 year ago [facility] Montel Culver, MD   1 year ago Acute cystitis without hematuria   [facility] Glean Hess, MD             Passed - Last Heart Rate in normal range    Pulse Readings from Last 1 Encounters:  03/10/22 71

## 2022-03-12 ENCOUNTER — Other Ambulatory Visit: Payer: Self-pay | Admitting: Internal Medicine

## 2022-03-12 DIAGNOSIS — I10 Essential (primary) hypertension: Secondary | ICD-10-CM

## 2022-03-13 DIAGNOSIS — N186 End stage renal disease: Secondary | ICD-10-CM | POA: Diagnosis not present

## 2022-03-13 DIAGNOSIS — I10 Essential (primary) hypertension: Secondary | ICD-10-CM | POA: Diagnosis not present

## 2022-03-13 DIAGNOSIS — Z992 Dependence on renal dialysis: Secondary | ICD-10-CM | POA: Diagnosis not present

## 2022-03-13 DIAGNOSIS — N2581 Secondary hyperparathyroidism of renal origin: Secondary | ICD-10-CM | POA: Diagnosis not present

## 2022-03-13 NOTE — Telephone Encounter (Signed)
Refilled 03/11/2022 #60 0 refills - courtesy refill. Requested Prescriptions  Pending Prescriptions Disp Refills  . metoprolol tartrate (LOPRESSOR) 25 MG tablet [Pharmacy Med Name: METOPROLOL TARTRATE 25 MG TAB] 180 tablet 1    Sig: TAKE 1 TABLET BY MOUTH TWICE A DAY     Cardiovascular:  Beta Blockers Failed - 03/12/2022  9:18 AM      Failed - Last BP in normal range    BP Readings from Last 1 Encounters:  03/10/22 (!) 193/86         Failed - Valid encounter within last 6 months    Recent Outpatient Visits          7 months ago Sleep disorder breathing   [facility] Glean Hess, MD   11 months ago Annual physical exam   [facility] Glean Hess, MD   1 year ago Acute cystitis without hematuria   [facility] Glean Hess, MD   1 year ago [facility] Montel Culver, MD   1 year ago Acute cystitis without hematuria   [facility] Glean Hess, MD             Passed - Last Heart Rate in normal range    Pulse Readings from Last 1 Encounters:  03/10/22 71

## 2022-03-15 ENCOUNTER — Encounter (INDEPENDENT_AMBULATORY_CARE_PROVIDER_SITE_OTHER): Payer: Self-pay | Admitting: Nurse Practitioner

## 2022-03-15 DIAGNOSIS — Z992 Dependence on renal dialysis: Secondary | ICD-10-CM | POA: Diagnosis not present

## 2022-03-15 DIAGNOSIS — N186 End stage renal disease: Secondary | ICD-10-CM | POA: Diagnosis not present

## 2022-03-15 DIAGNOSIS — I10 Essential (primary) hypertension: Secondary | ICD-10-CM | POA: Diagnosis not present

## 2022-03-15 DIAGNOSIS — N2581 Secondary hyperparathyroidism of renal origin: Secondary | ICD-10-CM | POA: Diagnosis not present

## 2022-03-18 ENCOUNTER — Telehealth (INDEPENDENT_AMBULATORY_CARE_PROVIDER_SITE_OTHER): Payer: Self-pay

## 2022-03-18 DIAGNOSIS — N2581 Secondary hyperparathyroidism of renal origin: Secondary | ICD-10-CM | POA: Diagnosis not present

## 2022-03-18 DIAGNOSIS — Z992 Dependence on renal dialysis: Secondary | ICD-10-CM | POA: Diagnosis not present

## 2022-03-18 DIAGNOSIS — I10 Essential (primary) hypertension: Secondary | ICD-10-CM | POA: Diagnosis not present

## 2022-03-18 DIAGNOSIS — N186 End stage renal disease: Secondary | ICD-10-CM | POA: Diagnosis not present

## 2022-03-18 NOTE — Telephone Encounter (Addendum)
I attempted to contact the patient to schedule her for a left brachial axillary graft with Dr. Delana Meyer. A message was left for a return call. Patient has called back and left a message that it is okay to leave a message on her voicemail regarding her surgery. Patient has been scheduled with Dr. Delana Meyer for a left brachial axillary graft on 03/28/22 at [facility]. Pre-op phone call is on 03/24/22 between 8-1 pm. Pre-surgical instructions will be mailed.

## 2022-03-20 DIAGNOSIS — I10 Essential (primary) hypertension: Secondary | ICD-10-CM | POA: Diagnosis not present

## 2022-03-20 DIAGNOSIS — N186 End stage renal disease: Secondary | ICD-10-CM | POA: Diagnosis not present

## 2022-03-20 DIAGNOSIS — N2581 Secondary hyperparathyroidism of renal origin: Secondary | ICD-10-CM | POA: Diagnosis not present

## 2022-03-20 DIAGNOSIS — Z992 Dependence on renal dialysis: Secondary | ICD-10-CM | POA: Diagnosis not present

## 2022-03-21 ENCOUNTER — Other Ambulatory Visit (INDEPENDENT_AMBULATORY_CARE_PROVIDER_SITE_OTHER): Payer: Self-pay | Admitting: Nurse Practitioner

## 2022-03-21 DIAGNOSIS — N186 End stage renal disease: Secondary | ICD-10-CM

## 2022-03-22 DIAGNOSIS — N186 End stage renal disease: Secondary | ICD-10-CM | POA: Diagnosis not present

## 2022-03-22 DIAGNOSIS — I10 Essential (primary) hypertension: Secondary | ICD-10-CM | POA: Diagnosis not present

## 2022-03-22 DIAGNOSIS — Z992 Dependence on renal dialysis: Secondary | ICD-10-CM | POA: Diagnosis not present

## 2022-03-22 DIAGNOSIS — N2581 Secondary hyperparathyroidism of renal origin: Secondary | ICD-10-CM | POA: Diagnosis not present

## 2022-03-24 ENCOUNTER — Inpatient Hospital Stay
Admission: RE | Admit: 2022-03-24 | Discharge: 2022-03-24 | Disposition: A | Discharge status: Home / Self Care | Payer: BC Managed Care – PPO | Source: Ambulatory Visit

## 2022-03-24 DIAGNOSIS — N2581 Secondary hyperparathyroidism of renal origin: Secondary | ICD-10-CM | POA: Diagnosis not present

## 2022-03-24 DIAGNOSIS — Z992 Dependence on renal dialysis: Secondary | ICD-10-CM | POA: Diagnosis not present

## 2022-03-24 DIAGNOSIS — N186 End stage renal disease: Secondary | ICD-10-CM | POA: Diagnosis not present

## 2022-03-24 DIAGNOSIS — E8779 Other fluid overload: Secondary | ICD-10-CM | POA: Diagnosis not present

## 2022-03-24 NOTE — H&P (View-Only) (Signed)
Subjective:    Patient ID: Kelli [name], female    DOB: Aug 21, 1961, 61 y.o.   MRN: [id number] No chief complaint on file.    The patient is seen for evaluation of dialysis access.  The patient previously had peritoneal dialysis and has recently been doing hemodialysis with a PermCath.  There have not been multiple episodes of catheter infection.  The patient denies fever and chills while on dialysis.  No tenderness or drainage at the exit site.  No recent shortening of the patient's walking distance or new symptoms consistent with claudication.  No history of rest pain symptoms. No new ulcers or wounds of the lower extremities have occurred.  The patient denies amaurosis fugax or recent TIA symptoms. There are no recent neurological changes noted. There is no history of DVT, PE or superficial thrombophlebitis. No recent episodes of angina or shortness of breath documented.    The patient has adequate access for a left brachial axillary AV graft.    Review of Systems  All other systems reviewed and are negative.      Objective:   Physical Exam Vitals reviewed.  HENT:     Head: Normocephalic.  Cardiovascular:     Rate and Rhythm: Normal rate.     Pulses: Normal pulses.  Pulmonary:     Effort: Pulmonary effort is normal.  Skin:    General: Skin is warm and dry.  Neurological:     Mental Status: She is alert and oriented to person, place, and time.  Psychiatric:        Mood and Affect: Mood normal.        Behavior: Behavior normal.        Thought Content: Thought content normal.        Judgment: Judgment normal.     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, left eye, severe stage    Hyperlipidemia    Migraine    Neuropathy    PUD (peptic ulcer disease)    Staph infection     Social History   Socioeconomic History   Marital status: Married    Spouse name: Not on file   Number of children: Not on file   Years of education: Not on file   Highest education level: Not on file  Occupational History   Not on file  Tobacco Use   Smoking status: Former    Passive exposure: Never   Smokeless tobacco: Never  Vaping Use   Vaping Use: Never used  Substance and Sexual Activity   Alcohol use: No   Drug use: No   Sexual activity: Not Currently  Other Topics Concern   Not on file  Social History Narrative   Not on file   Social Determinants of Health   Financial Resource Strain: Not on file  Food Insecurity: Not on file  Transportation Needs: Not on file  Physical Activity: Not on  file  Stress: Not on file  Social Connections: Not on file  Intimate Partner Violence: Not on file    Past Surgical History:  Procedure Laterality Date   ABDOMINAL HYSTERECTOMY     CAPD INSERTION N/A 05/30/2021   Procedure: CONTINUOUS AMBULATORY PERITONEAL DIALYSIS  (CAPD) CATHETER INSERTION;  Surgeon: Jules Husbands, MD;  Location: [facility];  Service: General;  Laterality: N/A;   CAPD REMOVAL N/A 12/06/2021   Procedure: REMOVAL OF CONTINUOUS AMBULATORY PERITONEAL DIALYSIS  (CAPD) CATHETER;  Surgeon: Jules Husbands, MD;  Location: [facility];  Service: General;  Laterality: N/A;   COLONOSCOPY W/ POLYPECTOMY     DIALYSIS/PERMA CATHETER INSERTION N/A 05/13/2021   Procedure: DIALYSIS/PERMA CATHETER INSERTION;  Surgeon: Algernon Huxley, MD;  Location: [facility];  Service: Cardiovascular;  Laterality: N/A;   ESOPHAGOGASTRODUODENOSCOPY  03/02/2017   Normal   HEMORRHOID SURGERY      Family History  Problem  Relation Age of Onset   Heart failure Mother    Diabetes Mother    Breast cancer Neg Hx     Allergies  Allergen Reactions   Statins Nausea And Vomiting   Albumin Human Other (See Comments)    Refuses all blood products as one of Jehovah's Witnesses    Gabapentin Swelling   Hydrochlorothiazide Nausea And Vomiting   Lisinopril Other (See Comments)    fatigue   Lyrica [Pregabalin] Swelling   Metformin And Related Nausea And Vomiting   Penicillins Nausea And Vomiting       Latest Ref Rng & Units 12/06/2021    7:12 AM 12/06/2021    7:10 AM 05/31/2021    4:17 AM  CBC  WBC 4.0 - 10.5 K/uL  6.7  10.7   Hemoglobin 12.0 - 15.0 g/dL 11.6  11.4  7.6   Hematocrit 36.0 - 46.0 % 34.0  36.1  22.5   Platelets 150 - 400 K/uL  260  213       CMP     Component Value Date/Time   NA 137 12/06/2021 0712   NA 140 04/10/2021 1613   K 3.6 12/06/2021 0712   CL 101 12/06/2021 0712   CO2 27 12/06/2021 0710   GLUCOSE 247 (H) 12/06/2021 0712   BUN 17 12/06/2021 0712   BUN 40 (H) 04/10/2021 1613   CREATININE 4.80 (H) 12/06/2021 0712   CALCIUM 7.5 (L) 12/06/2021 0710   PROT 5.4 (L) 12/06/2021 0710   ALBUMIN 2.9 (L) 12/06/2021 0710   AST 20 12/06/2021 0710   ALT 14 12/06/2021 0710   ALKPHOS 63 12/06/2021 0710   BILITOT 0.8 12/06/2021 0710   GFRNONAA 11 (L) 12/06/2021 0710   GFRAA 38 (L) 07/18/2019 1427     No results found.     Assessment & Plan:   1. ESRD (end stage renal disease) ([place]) Recommend:  At this time the patient does not have appropriate extremity access for dialysis  Patient should have a left brachial axillary AV graft created.  The risks, benefits and alternative therapies were reviewed in detail with the patient.  All questions were answered.  The patient agrees to proceed with surgery.   The patient will follow up with me in the office after the surgery.    Current Outpatient Medications on File Prior to Visit  Medication Sig Dispense Refill   acetaminophen  (TYLENOL) 500 MG tablet Take 1 tablet by mouth every 8 (eight) hours as needed.     amLODipine (NORVASC) 10 MG tablet Take by mouth.  aspirin EC 81 MG tablet Take 81 mg by mouth daily. Swallow whole.     BD PEN NEEDLE NANO U/F 32G X 4 MM MISC Inject 1 each into the skin 3 (three) times daily.     brimonidine (ALPHAGAN) 0.2 % ophthalmic solution Place 1 drop into the left eye 2 (two) times daily.     calcium acetate (PHOSLO) 667 MG capsule Take 667 mg by mouth 3 (three) times daily.     clopidogrel (PLAVIX) 75 MG tablet Take 75 mg by mouth daily.     Continuous Blood Gluc Receiver (FREESTYLE LIBRE 2 READER) DEVI USE AS DIRECTED 2 each 0   Continuous Blood Gluc Sensor (FREESTYLE LIBRE 2 SENSOR) MISC USE AS DIRECTED EVERY 14 DAYS 2 each 3   dorzolamide-timolol (COSOPT) 22.3-6.8 MG/ML ophthalmic solution SMARTSIG:1 Drop(s) Left Eye Every 12 Hours     Epoetin Alfa (EPOGEN IJ) Epoetin Alfa (Epogen)     escitalopram (LEXAPRO) 5 MG tablet TAKE 1 TABLET (5 MG TOTAL) BY MOUTH DAILY. 90 tablet 0   famotidine (PEPCID) 20 MG tablet TAKE 1 TABLET BY MOUTH EVERY DAY IN THE EVENING 30 tablet 0   glucose blood (FREESTYLE LITE) test strip 1 each by Other route five (5) times a day.     HYDROcodone-acetaminophen (NORCO/VICODIN) 5-325 MG tablet Take 1 tablet by mouth every 6 (six) hours as needed for moderate pain. 15 tablet 0   insulin glargine (LANTUS SOLOSTAR) 100 UNIT/ML Solostar Pen Inject 16 Units into the skin daily.     insulin lispro (HUMALOG) 100 UNIT/ML injection Inject 4-8 Units into the skin 4 (four) times daily as needed for high blood sugar. SSI     latanoprost (XALATAN) 0.005 % ophthalmic solution Place 1 drop into the left eye at bedtime.     losartan (COZAAR) 100 MG tablet Take by mouth.     mirtazapine (REMERON) 7.5 MG tablet Take 7.5 mg by mouth at bedtime.     nitroGLYCERIN (NITROSTAT) 0.4 MG SL tablet Place under the tongue.     pantoprazole (PROTONIX) 40 MG tablet Take 40 mg by mouth  daily.     RABEprazole (ACIPHEX) 20 MG tablet Take 20 mg by mouth 2 (two) times daily.     rosuvastatin (CRESTOR) 10 MG tablet Take 10 mg by mouth daily.     metoprolol tartrate (LOPRESSOR) 25 MG tablet TAKE 1 TABLET BY MOUTH TWICE A DAY 60 tablet 0   No current facility-administered medications on file prior to visit.    There are no Patient Instructions on file for this visit. No follow-ups on file.   Kris Hartmann, NP

## 2022-03-24 NOTE — Progress Notes (Signed)
Subjective:    Patient ID: Kelli [name], female    DOB: [birth year], 61 y.o.   MRN: [id number] No chief complaint on file.    The patient is seen for evaluation of dialysis access.  The patient previously had peritoneal dialysis and has recently been doing hemodialysis with a PermCath.  There have not been multiple episodes of catheter infection.  The patient denies fever and chills while on dialysis.  No tenderness or drainage at the exit site.  No recent shortening of the patient's walking distance or new symptoms consistent with claudication.  No history of rest pain symptoms. No new ulcers or wounds of the lower extremities have occurred.  The patient denies amaurosis fugax or recent TIA symptoms. There are no recent neurological changes noted. There is no history of DVT, PE or superficial thrombophlebitis. No recent episodes of angina or shortness of breath documented.    The patient has adequate access for a left brachial axillary AV graft.    Review of Systems  All other systems reviewed and are negative.      Objective:   Physical Exam Vitals reviewed.  HENT:     Head: Normocephalic.  Cardiovascular:     Rate and Rhythm: Normal rate.     Pulses: Normal pulses.  Pulmonary:     Effort: Pulmonary effort is normal.  Skin:    General: Skin is warm and dry.  Neurological:     Mental Status: She is alert and oriented to person, place, and time.  Psychiatric:        Mood and Affect: Mood normal.        Behavior: Behavior normal.        Thought Content: Thought content normal.        Judgment: Judgment normal.     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, left eye, severe stage    Hyperlipidemia    Migraine    Neuropathy    PUD (peptic ulcer disease)    Staph infection     Social History   Socioeconomic History   Marital status: Married    Spouse name: Not on file   Number of children: Not on file   Years of education: Not on file   Highest education level: Not on file  Occupational History   Not on file  Tobacco Use   Smoking status: Former    Passive exposure: Never   Smokeless tobacco: Never  Vaping Use   Vaping Use: Never used  Substance and Sexual Activity   Alcohol use: No   Drug use: No   Sexual activity: Not Currently  Other Topics Concern   Not on file  Social History Narrative   Not on file   Social Determinants of Health   Financial Resource Strain: Not on file  Food Insecurity: Not on file  Transportation Needs: Not on file  Physical Activity: Not on  file  Stress: Not on file  Social Connections: Not on file  Intimate Partner Violence: Not on file    Past Surgical History:  Procedure Laterality Date   ABDOMINAL HYSTERECTOMY     CAPD INSERTION N/A 05/30/2021   Procedure: CONTINUOUS AMBULATORY PERITONEAL DIALYSIS  (CAPD) CATHETER INSERTION;  Surgeon: Jules Husbands, MD;  Location: [facility];  Service: General;  Laterality: N/A;   CAPD REMOVAL N/A 12/06/2021   Procedure: REMOVAL OF CONTINUOUS AMBULATORY PERITONEAL DIALYSIS  (CAPD) CATHETER;  Surgeon: Jules Husbands, MD;  Location: [facility];  Service: General;  Laterality: N/A;   COLONOSCOPY W/ POLYPECTOMY     DIALYSIS/PERMA CATHETER INSERTION N/A 05/13/2021   Procedure: DIALYSIS/PERMA CATHETER INSERTION;  Surgeon: Algernon Huxley, MD;  Location: [facility];  Service: Cardiovascular;  Laterality: N/A;   ESOPHAGOGASTRODUODENOSCOPY  03/02/2017   Normal   HEMORRHOID SURGERY      Family History  Problem  Relation Age of Onset   Heart failure Mother    Diabetes Mother    Breast cancer Neg Hx     Allergies  Allergen Reactions   Statins Nausea And Vomiting   Albumin Human Other (See Comments)    Refuses all blood products as one of Jehovah's Witnesses    Gabapentin Swelling   Hydrochlorothiazide Nausea And Vomiting   Lisinopril Other (See Comments)    fatigue   Lyrica [Pregabalin] Swelling   Metformin And Related Nausea And Vomiting   Penicillins Nausea And Vomiting       Latest Ref Rng & Units 12/06/2021    7:12 AM 12/06/2021    7:10 AM 05/31/2021    4:17 AM  CBC  WBC 4.0 - 10.5 K/uL  6.7  10.7   Hemoglobin 12.0 - 15.0 g/dL 11.6  11.4  7.6   Hematocrit 36.0 - 46.0 % 34.0  36.1  22.5   Platelets 150 - 400 K/uL  260  213       CMP     Component Value Date/Time   NA 137 12/06/2021 0712   NA 140 04/10/2021 1613   K 3.6 12/06/2021 0712   CL 101 12/06/2021 0712   CO2 27 12/06/2021 0710   GLUCOSE 247 (H) 12/06/2021 0712   BUN 17 12/06/2021 0712   BUN 40 (H) 04/10/2021 1613   CREATININE 4.80 (H) 12/06/2021 0712   CALCIUM 7.5 (L) 12/06/2021 0710   PROT 5.4 (L) 12/06/2021 0710   ALBUMIN 2.9 (L) 12/06/2021 0710   AST 20 12/06/2021 0710   ALT 14 12/06/2021 0710   ALKPHOS 63 12/06/2021 0710   BILITOT 0.8 12/06/2021 0710   GFRNONAA 11 (L) 12/06/2021 0710   GFRAA 38 (L) 07/18/2019 1427     No results found.     Assessment & Plan:   1. ESRD (end stage renal disease) ([place]) Recommend:  At this time the patient does not have appropriate extremity access for dialysis  Patient should have a left brachial axillary AV graft created.  The risks, benefits and alternative therapies were reviewed in detail with the patient.  All questions were answered.  The patient agrees to proceed with surgery.   The patient will follow up with me in the office after the surgery.    Current Outpatient Medications on File Prior to Visit  Medication Sig Dispense Refill   acetaminophen  (TYLENOL) 500 MG tablet Take 1 tablet by mouth every 8 (eight) hours as needed.     amLODipine (NORVASC) 10 MG tablet Take by mouth.  aspirin EC 81 MG tablet Take 81 mg by mouth daily. Swallow whole.     BD PEN NEEDLE NANO U/F 32G X 4 MM MISC Inject 1 each into the skin 3 (three) times daily.     brimonidine (ALPHAGAN) 0.2 % ophthalmic solution Place 1 drop into the left eye 2 (two) times daily.     calcium acetate (PHOSLO) 667 MG capsule Take 667 mg by mouth 3 (three) times daily.     clopidogrel (PLAVIX) 75 MG tablet Take 75 mg by mouth daily.     Continuous Blood Gluc Receiver (FREESTYLE LIBRE 2 READER) DEVI USE AS DIRECTED 2 each 0   Continuous Blood Gluc Sensor (FREESTYLE LIBRE 2 SENSOR) MISC USE AS DIRECTED EVERY 14 DAYS 2 each 3   dorzolamide-timolol (COSOPT) 22.3-6.8 MG/ML ophthalmic solution SMARTSIG:1 Drop(s) Left Eye Every 12 Hours     Epoetin Alfa (EPOGEN IJ) Epoetin Alfa (Epogen)     escitalopram (LEXAPRO) 5 MG tablet TAKE 1 TABLET (5 MG TOTAL) BY MOUTH DAILY. 90 tablet 0   famotidine (PEPCID) 20 MG tablet TAKE 1 TABLET BY MOUTH EVERY DAY IN THE EVENING 30 tablet 0   glucose blood (FREESTYLE LITE) test strip 1 each by Other route five (5) times a day.     HYDROcodone-acetaminophen (NORCO/VICODIN) 5-325 MG tablet Take 1 tablet by mouth every 6 (six) hours as needed for moderate pain. 15 tablet 0   insulin glargine (LANTUS SOLOSTAR) 100 UNIT/ML Solostar Pen Inject 16 Units into the skin daily.     insulin lispro (HUMALOG) 100 UNIT/ML injection Inject 4-8 Units into the skin 4 (four) times daily as needed for high blood sugar. SSI     latanoprost (XALATAN) 0.005 % ophthalmic solution Place 1 drop into the left eye at bedtime.     losartan (COZAAR) 100 MG tablet Take by mouth.     mirtazapine (REMERON) 7.5 MG tablet Take 7.5 mg by mouth at bedtime.     nitroGLYCERIN (NITROSTAT) 0.4 MG SL tablet Place under the tongue.     pantoprazole (PROTONIX) 40 MG tablet Take 40 mg by mouth  daily.     RABEprazole (ACIPHEX) 20 MG tablet Take 20 mg by mouth 2 (two) times daily.     rosuvastatin (CRESTOR) 10 MG tablet Take 10 mg by mouth daily.     metoprolol tartrate (LOPRESSOR) 25 MG tablet TAKE 1 TABLET BY MOUTH TWICE A DAY 60 tablet 0   No current facility-administered medications on file prior to visit.    There are no Patient Instructions on file for this visit. No follow-ups on file.   Kris Hartmann, NP

## 2022-03-24 NOTE — Patient Instructions (Signed)
Your procedure is scheduled on: 03/28/22 - Friday Report to the Registration Desk on the 1st floor of the [facility]. To find out your arrival time, please call [phone] between 1PM - 3PM on: 03/27/22 - Thursday If your arrival time is 6:00 am, do not arrive prior to that time as the [facility] entrance doors do not open until 6:00 am.  REMEMBER: Instructions that are not followed completely may result in serious medical risk, up to and including death; or upon the discretion of your surgeon and anesthesiologist your surgery may need to be rescheduled.  Do not eat food after midnight the night before surgery.  No gum chewing, lozengers or hard candies.  TAKE ONLY THESE MEDICATIONS THE MORNING OF SURGERY WITH A SIP OF WATER:  - amLODipine (NORVASC) - brimonidine (ALPHAGAN)  - dorzolamide-timolol (COSOPT)  - escitalopram (LEXAPRO)  - metoprolol tartrate (LOPRESSOR)  - pantoprazole (PROTONIX)  - RABEprazole (ACIPHEX)  - rosuvastatin (CRESTOR)   insulin glargine (LANTUS SOLOSTAR) -   Follow recommendations from Cardiologist, Pulmonologist or PCP regarding stopping Aspirin, Coumadin, Plavix, Eliquis, Pradaxa, or Pletal. Stop taking Plavix beginning 03/24/22 , may resume taking with doctors order.  One week prior to surgery: Stop Anti-inflammatories (NSAIDS) such as Advil, Aleve, Ibuprofen, Motrin, Naproxen, Naprosyn and Aspirin based products such as Excedrin, Goodys Powder, BC Powder.  Stop ANY OVER THE COUNTER supplements until after surgery.  You may take Tylenol if needed for pain up until the day of surgery.  No Alcohol for 24 hours before or after surgery.  No Smoking including e-cigarettes for 24 hours prior to surgery.  No chewable tobacco products for at least 6 hours prior to surgery.  No nicotine patches on the day of surgery.  Do not use any "recreational" drugs for at least a week prior to your surgery.  Please be advised that the combination of cocaine and  anesthesia may have negative outcomes, up to and including death. If you test positive for cocaine, your surgery will be cancelled.  On the morning of surgery brush your teeth with toothpaste and water, you may rinse your mouth with mouthwash if you wish. Do not swallow any toothpaste or mouthwash.  Use CHG Soap or wipes as directed on instruction sheet.  Do not wear jewelry, make-up, hairpins, clips or nail polish.  Do not wear lotions, powders, or perfumes.   Do not shave body from the neck down 48 hours prior to surgery just in case you cut yourself which could leave a site for infection.  Also, freshly shaved skin may become irritated if using the CHG soap.  Contact lenses, hearing aids and dentures may not be worn into surgery.  Do not bring valuables to the hospital. [facility] is not responsible for any missing/lost belongings or valuables.   Notify your doctor if there is any change in your medical condition (cold, fever, infection).  Wear comfortable clothing (specific to your surgery type) to the hospital.  After surgery, you can help prevent lung complications by doing breathing exercises.  Take deep breaths and cough every 1-2 hours. Your doctor may order a device called an Incentive Spirometer to help you take deep breaths. When coughing or sneezing, hold a pillow firmly against your incision with both hands. This is called "splinting." Doing this helps protect your incision. It also decreases belly discomfort.  If you are being admitted to the hospital overnight, leave your suitcase in the car. After surgery it may be brought to your room.  If you are being discharged the day of surgery, you will not be allowed to drive home. You will need a responsible adult (18 years or older) to drive you home and stay with you that night.   If you are taking public transportation, you will need to have a responsible adult (18 years or older) with you. Please confirm with your  physician that it is acceptable to use public transportation.   Please call the [facility] at [phone] if you have any questions about these instructions.  Surgery Visitation Policy:  Patients undergoing a surgery or procedure may have two family members or support persons with them as long as the person is not COVID-19 positive or experiencing its symptoms.   Inpatient Visitation:    Visiting hours are 7 a.m. to 8 p.m. Up to four visitors are allowed at one time in a patient room, including children. The visitors may rotate out with other people during the day. One designated support person (adult) may remain overnight.

## 2022-03-25 ENCOUNTER — Encounter: Payer: Self-pay | Admitting: Vascular Surgery

## 2022-03-25 ENCOUNTER — Other Ambulatory Visit: Payer: Self-pay | Admitting: Internal Medicine

## 2022-03-25 ENCOUNTER — Encounter
Admission: RE | Admit: 2022-03-25 | Discharge: 2022-03-25 | Disposition: A | Discharge status: Home / Self Care | Payer: BC Managed Care – PPO | Source: Ambulatory Visit | Attending: Vascular Surgery | Admitting: Vascular Surgery

## 2022-03-25 ENCOUNTER — Other Ambulatory Visit: Payer: Self-pay

## 2022-03-25 DIAGNOSIS — Z992 Dependence on renal dialysis: Secondary | ICD-10-CM | POA: Diagnosis not present

## 2022-03-25 DIAGNOSIS — F331 Major depressive disorder, recurrent, moderate: Secondary | ICD-10-CM

## 2022-03-25 DIAGNOSIS — N186 End stage renal disease: Secondary | ICD-10-CM | POA: Diagnosis not present

## 2022-03-25 DIAGNOSIS — N2581 Secondary hyperparathyroidism of renal origin: Secondary | ICD-10-CM | POA: Diagnosis not present

## 2022-03-25 DIAGNOSIS — Z23 Encounter for immunization: Secondary | ICD-10-CM | POA: Diagnosis not present

## 2022-03-25 DIAGNOSIS — R110 Nausea: Secondary | ICD-10-CM | POA: Diagnosis not present

## 2022-03-25 HISTORY — DX: Angina pectoris, unspecified: I20.9

## 2022-03-25 HISTORY — DX: Essential (primary) hypertension: I10

## 2022-03-25 HISTORY — DX: Gastro-esophageal reflux disease without esophagitis: K21.9

## 2022-03-25 NOTE — Patient Instructions (Addendum)
Your procedure is scheduled on: 03/28/22 - Friday  Report to the Registration Desk on the 1st floor of the [facility]. To find out your arrival time, please call [phone] between 1PM - 3PM on: 03/27/22 - Thursday If your arrival time is 6:00 am, do not arrive prior to that time as the [facility] entrance doors do not open until 6:00 am.  REMEMBER: Instructions that are not followed completely may result in serious medical risk, up to and including death; or upon the discretion of your surgeon and anesthesiologist your surgery may need to be rescheduled.  Do not eat food or drink any fluids after midnight the night before surgery.  No gum chewing, lozengers or hard candies.  TAKE THESE MEDICATIONS THE MORNING OF SURGERY WITH A SIP OF WATER:  - brimonidine (ALPHAGAN) - dorzolamide-timolol (COSOPT)   - RABEprazole (ACIPHEX)    HOLD insulin glargine (LANTUS SOLOSTAR) on the   Follow recommendations from Cardiologist, Pulmonologist or PCP regarding stopping Aspirin, Coumadin, Plavix, Eliquis, Pradaxa, or Pletal. Stop taking clopidogrel (PLAVIX) beginning 03/23/22.  One week prior to surgery: Stop Anti-inflammatories (NSAIDS) such as Advil, Aleve, Ibuprofen, Motrin, Naproxen, Naprosyn and Aspirin based products such as Excedrin, Goodys Powder, BC Powder.  Stop ANY OVER THE COUNTER supplements until after surgery.  You may take Tylenol if needed for pain up until the day of surgery.  No Alcohol for 24 hours before or after surgery.  No Smoking including e-cigarettes for 24 hours prior to surgery.  No chewable tobacco products for at least 6 hours prior to surgery.  No nicotine patches on the day of surgery.  Do not use any "recreational" drugs for at least a week prior to your surgery.  Please be advised that the combination of cocaine and anesthesia may have negative outcomes, up to and including death. If you test positive for cocaine, your surgery will be cancelled.  On  the morning of surgery brush your teeth with toothpaste and water, you may rinse your mouth with mouthwash if you wish. Do not swallow any toothpaste or mouthwash.  Do not wear jewelry, make-up, hairpins, clips or nail polish.  Do not wear lotions, powders, or perfumes.   Do not shave body from the neck down 48 hours prior to surgery just in case you cut yourself which could leave a site for infection.  Also, freshly shaved skin may become irritated if using the CHG soap.  Contact lenses, hearing aids and dentures may not be worn into surgery.  Do not bring valuables to the hospital. [facility] is not responsible for any missing/lost belongings or valuables.   Notify your doctor if there is any change in your medical condition (cold, fever, infection).  Wear comfortable clothing (specific to your surgery type) to the hospital.  After surgery, you can help prevent lung complications by doing breathing exercises.  Take deep breaths and cough every 1-2 hours. Your doctor may order a device called an Incentive Spirometer to help you take deep breaths. When coughing or sneezing, hold a pillow firmly against your incision with both hands. This is called "splinting." Doing this helps protect your incision. It also decreases belly discomfort.  If you are being admitted to the hospital overnight, leave your suitcase in the car. After surgery it may be brought to your room.  If you are being discharged the day of surgery, you will not be allowed to drive home. You will need a responsible adult (18 years or older) to drive  you home and stay with you that night.   If you are taking public transportation, you will need to have a responsible adult (18 years or older) with you. Please confirm with your physician that it is acceptable to use public transportation.   Please call the [place] Dept. at [phone] if you have any questions about these instructions.  Surgery Visitation  Policy:  Patients undergoing a surgery or procedure may have two family members or support persons with them as long as the person is not COVID-19 positive or experiencing its symptoms.   Inpatient Visitation:    Visiting hours are 7 a.m. to 8 p.m. Up to four visitors are allowed at one time in a patient room, including children. The visitors may rotate out with other people during the day. One designated support person (adult) may remain overnight.

## 2022-03-26 ENCOUNTER — Other Ambulatory Visit (INDEPENDENT_AMBULATORY_CARE_PROVIDER_SITE_OTHER): Payer: Self-pay | Admitting: Nurse Practitioner

## 2022-03-26 ENCOUNTER — Encounter: Payer: Self-pay | Admitting: Vascular Surgery

## 2022-03-26 ENCOUNTER — Encounter: Payer: Self-pay | Admitting: Urgent Care

## 2022-03-26 NOTE — Progress Notes (Signed)
Perioperative Services  Pre-Admission/Anesthesia Testing Clinical Review  Date: 03/27/22  Patient Demographics:  Name: Kelli [name] DOB:   12-Jul-1961 MRN:   [id number]  Planned Surgical Procedure(s):    Case: [id number] Date/Time: 03/28/22 0932   Procedure: INSERTION OF ARTERIOVENOUS (AV) GORE-TEX GRAFT ARM (BRACHIAL AXILLARY) (Left)   Anesthesia type: General   Pre-op diagnosis: ESRD   Location: [facility] OR ROOM 08 / [place] ORS FOR ANESTHESIA GROUP   Surgeons: Katha Cabal, MD   NOTE: Available PAT nursing documentation and vital signs have been reviewed. Clinical nursing staff has updated patient's PMH/PSHx, current medication list, and drug allergies/intolerances to ensure comprehensive history available to assist in medical decision making as it pertains to the aforementioned surgical procedure and anticipated anesthetic course. Extensive review of available clinical information performed. [place] PMH and PSHx updated with any diagnoses/procedures that  may have been inadvertently omitted during her intake with the pre-admission testing department's nursing staff.  Clinical Discussion:  Kelli [name] is a 61 y.o. female who is submitted for pre-surgical anesthesia review and clearance prior to her undergoing the above procedure.Patient is a Former Research scientist (life sciences). 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  Patient is followed by cardiology (Pagidipati, MD, MD). She was last seen in the cardiology clinic on 12/11/2021; notes reviewed.  At the time of her clinic visit, patient denied any episodes of chest pain, however experiences some shortness of breath before her hemodialysis treatments.  Patient reported that her breathing improved following hemodialysis.  She denied any PND, orthopnea, palpitations, significant peripheral edema, vertiginous  symptoms, or presyncope/syncope.  Blood pressures elevated at home running in the 200s/110s range.  Patient complaining of a greater than 5-year history of chronic nausea. Patient with a past medical history significant for cardiovascular diagnoses.  PET myocardial perfusion imaging study performed on 09/27/2021 revealing evidence of a large area of ischemia, but no concern for infarct.  There was very abnormal global myocardial blood flow reserve; 0.7 mL/min/g (rest), 0.6 mL/min/g (stress), 1.1 (myocardial blood flow reserve; normal =/>2). Left ventricular ejection fraction decreased at 42%.  There was severe apical and mild inferior wall hypokinesis.  Patient underwent diagnostic left heart catheterization on 10/09/2021 revealing multivessel CAD; 50% D2, 50% mid LCx, 70% proximal RCA, 70% mid RCA, and 70% distal RCA. PCI was subsequently performed placing a 3.0 x 48 mm proximal RCA and a 2.75 x 38 mm distal RCA DES yielding excellent angiographic result and TIMI-3 flow.  Most recent TTE performed on 01/06/2022 revealed a mildly reduced left ventricular systolic function with an EF of 48%.  There was mild LVH and moderate left atrial enlargement.  GLS -10.7%. Diastolic Doppler parameters consistent with pseudonormalization (G2DD). There was trivial tricuspid and mild mitral valve regurgitation.  There was no evidence of a significant transvalvular gradient to suggest stenosis.  Following cardiac stent placement, patient remains on daily DAPT therapy (ASA +)).  Patient reportedly compliant with therapy with no evidence or reports of GI bleeding.  Blood pressure elevated at 160/89 on currently prescribed beta-blocker (carvedilol) and ARB (losartan) therapies.  Patient is on for her HLD diagnosis and further ASCVD prevention.  She has a supply of short acting nitrates (NTG) to use on a as needed basis for recurrent anginal symptoms; denied recent use. T1.5DM uncontrolled on currently prescribed regimen; last  HgbA1c was 8.7% when checked on 09/27/2021.  Patient  with an overall sedentary lifestyle due to chronic nausea multiple underlying comorbidities.  Patient reported that she "hardly leaves the house" with the exception of time spent away from home at dialysis.  Per the DASI, patient unable to achieve 4 METS of activity without angina/anginal equivalent symptoms.  Kelli [name] is scheduled for an INSERTION OF LEFT ARTERIOVENOUS (AV) GORE-TEX GRAFT ARM (BRACHIAL AXILLARY) on 03/28/2022 with Dr. Hortencia Pilar, MD. Given patient's past medical history significant for cardiovascular diagnoses, presurgical cardiac clearance was sought by the PAT team. Per cardiology, "this patient is optimized for surgery and may proceed with the planned procedural course with an ACCEPTABLE risk of significant perioperative cardiovascular complications". Again, patient remains on daily DAPT therapy. Patient has been cleared to hold her clopidogrel for up to 7 days prior to her surgery with plans to restart as soon as postoperative bleeding risk felt to be minimized by her primary attending surgery. Patient advised PAT staff that her last dose was on 03/22/2022. The patient will continue her daily low dose ASA throughout her perioperative course.   Patient denies previous perioperative complications with anesthesia in the past. In review of the available records, it is noted that patient underwent a general anesthetic course here at [facility] (ASA IV) in 11/2021 without documented complications.      03/10/2022   10:48 AM 02/26/2022    3:05 PM 12/19/2021    3:26 PM  Vitals with BMI  Height '5\' 6"'$  '5\' 6"'$  '5\' 6"'$   Weight 160 lbs 166 lbs 155 lbs  BMI 25.84 62.69 48.54  Systolic 627 035 009  Diastolic 86 381 829  Pulse 71 77 71    Providers/Specialists:   NOTE: Primary physician provider listed below. Patient may have been seen by APP or partner within same practice.   PROVIDER ROLE /  SPECIALTY LAST OV  Schnier, Dolores Lory, MD Vascular Surgery (Surgeon) 03/10/2022  Glean Hess, MD Primary Care Provider 08/06/2021  Buel Molder Bernhardt, MD   Cardiology 12/11/2021  Anthonette Legato, MD Nephrology 03/20/2022   Allergies:  Statins, Albumin human, Gabapentin, Hydrochlorothiazide, Lisinopril, Lyrica [pregabalin], Metformin and related, and Penicillins  Current Home Medications:   No current facility-administered medications for this encounter.    acetaminophen (TYLENOL) 500 MG tablet   aspirin EC 81 MG tablet   BD PEN NEEDLE NANO U/F 32G X 4 MM MISC   brimonidine (ALPHAGAN) 0.2 % ophthalmic solution   calcium acetate (PHOSLO) 667 MG capsule   carvedilol (COREG) 25 MG tablet   clopidogrel (PLAVIX) 75 MG tablet   Continuous Blood Gluc Receiver (FREESTYLE LIBRE 2 READER) DEVI   Continuous Blood Gluc Sensor (FREESTYLE LIBRE 2 SENSOR) MISC   dorzolamide-timolol (COSOPT) 22.3-6.8 MG/ML ophthalmic solution   Epoetin Alfa (EPOGEN IJ)   escitalopram (LEXAPRO) 5 MG tablet   glucose blood (FREESTYLE LITE) test strip   HYDROcodone-acetaminophen (NORCO/VICODIN) 5-325 MG tablet   insulin glargine (LANTUS SOLOSTAR) 100 UNIT/ML Solostar Pen   insulin lispro (HUMALOG) 100 UNIT/ML injection   latanoprost (XALATAN) 0.005 % ophthalmic solution   losartan (COZAAR) 100 MG tablet   nitroGLYCERIN (NITROSTAT) 0.4 MG SL tablet   RABEprazole (ACIPHEX) 20 MG tablet   rosuvastatin (CRESTOR) 10 MG tablet   History:   Past Medical History:  Diagnosis Date   Anemia of chronic renal failure    a.) on epoetin injections per nephrology   Anginal pain ([place])    Blood transfusion declined because patient is Jehovah's Witness    Cancer (  [place])    Charcot foot due to diabetes mellitus ([place])    Chorioretinal inflammation of both eyes    Coronary artery disease 10/07/2021   a.) LHC/PCI 10/07/2021: diffusely diseased RCA --> 3.0 x 48 mm (proximal) and 2.75 x 38 mm (distal) DES x 2 (unknown type)  placed   Depression    Diabetes mellitus type 1.5 (HCC)    Diastolic dysfunction 21/19/4174   a.) TTE 09/26/2021: EF 40%, sep/inf/post HK, mod LVH, GLS -10.1%, mild LAE, triv MR, G2DD; b.) TTE 01/06/2022: EF 48%, LVH, GLS -10.7%, moderate LAE, trivial TR, mild MR, G2DD   ESRD (end stage renal disease) on dialysis (HCC)    a.) T-Th-Sat   GERD (gastroesophageal reflux disease)    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, left eye, severe stage    Hyperlipidemia    Hypertension    Long term current use of antithrombotics/antiplatelets    a.) daily DAPT therapy (ASA + clopidogrel)   Migraine    Neuropathy    PUD (peptic ulcer disease)    Staph infection    Past Surgical History:  Procedure Laterality Date   ABDOMINAL HYSTERECTOMY     CAPD INSERTION N/A 05/30/2021   Procedure: CONTINUOUS AMBULATORY PERITONEAL DIALYSIS  (CAPD) CATHETER INSERTION;  Surgeon: Jules Husbands, MD;  Location: [facility] ORS;  Service: General;  Laterality: N/A;   CAPD REMOVAL N/A 12/06/2021   Procedure: REMOVAL OF CONTINUOUS AMBULATORY PERITONEAL DIALYSIS  (CAPD) CATHETER;  Surgeon: Jules Husbands, MD;  Location: [facility] ORS;  Service: General;  Laterality: N/A;   COLONOSCOPY W/ POLYPECTOMY     CORONARY ANGIOPLASTY WITH STENT PLACEMENT Left 10/07/2021   Procedure: CORONARY ANGIOPLASTY WITH STENT PLACEMENT; Location: [facility]; Surgeon: Weyman Pedro, MD   DIALYSIS/PERMA CATHETER INSERTION N/A 05/13/2021   Procedure: DIALYSIS/PERMA CATHETER INSERTION;  Surgeon: Algernon Huxley, MD;  Location: [facility];  Service: Cardiovascular;  Laterality: N/A;   ESOPHAGOGASTRODUODENOSCOPY  03/02/2017   Normal   HEMORRHOID SURGERY     Family History  Problem Relation Age of Onset   Heart failure Mother    Diabetes Mother    Breast cancer Neg Hx    Social History   Tobacco Use   Smoking status: Former    Passive exposure: Never   Smokeless tobacco: Never  Vaping Use   Vaping Use: Never used  Substance Use Topics    Alcohol use: No   Drug use: No    Pertinent Clinical Results:  LABS: Labs reviewed: Acceptable for surgery.   Ref Range & Units 03/13/2022  Neutrophils 40.0 - 75.0 % 69.6   Lymphocytes Relative 19.0 - 48.0 % 16.0 Low    Monocytes 3.0 - 10.0 % 8.5   Eosinophils Relative 0.0 - 7.0 % 2.8   Basophils Relative 0.0 - 1.5 % 0.5   LUC 0.0 - 4.0 % 2.7   WBC 4.80 - 10.80 1000/mcL 6.58   RBC 4.20 - 5.40 mill/mcL 3.46 Low    Hematocrit 37.0 - 47.0 % 33.3 Low    MCV 80 - 100 fl 96   MCH 27.0 - 31.0 pg 32.5 High    MCHC 30.0 - 36.0 g/dL 33.8   RDW 11.5 - 14.5 % 15.4 High    Hemoglobin 12.0 - 16.0 g/dL 11.2 Low    Hemoglobin x 3 36.0 - 48.0 % 33.6 Low    Platelets 130 - 400 1000/mcL 257   Resulting Agency  [facility]     Ref Range & Units 03/13/2022  BUN 6 - 19 mg/dL 55 High    Creatinine 0.60 - 1.30 mg/dL 7.33 High    BUN/Creatinine Ratio 10.0 - 20.0 7.5 Low    Sodium 136 - 145 mEq/L 139   Potassium 3.5 - 5.1 mEq/L 4.7   Chloride 96 - 108 mEq/L 102   Bicarbonate (CO2) 20 - 31 mEq/L 25   Calcium 8.7 - 10.4 mg/dL 7.9 Low    Corrected Calcium 8.7 - 10.4 mg/dL 8.1 Low    Phosphorus 2.6 - 4.5 mg/dL 8.7 High    Calcium Phosphorus Product 0 - 54 69 High    Calcium Phosporus Product, Cor 0 - 54 70 High    Total Protein 6.0 - 8.5 g/dL 5.7 Low    Albumin 3.5 - 5.2 g/dL 3.8   Globulin, Total 2.0 - 4.0 g/dL 1.9 Low    A/G Ratio 1.0 - 2.0 2.0   Iron 30 - 160 mcg/dL 50   UIBC 155 - 355 mcg/dL 228   TIBC 185 - 515 mcg/dL 278   Iron Saturation (TSat) 20 - 55 % 18 Low    Resulting Agency  [facility]    ECG: Date: 12/06/2021 Time ECG obtained: 0750 AM Rate: 81 bpm Rhythm: normal sinus Axis (leads I and aVF): Normal Intervals: PR 112 ms. QRS 86 ms. QTc 441 ms. ST segment and T wave changes: Inferolateral ST and T wave abnormalities Comparison: Tracing obtained on 06/06/2021 showed nonspecific ST and T wave abnormalities.   IMAGING / PROCEDURES: TRANSTHORACIC ECHOCARDIOGRAM  performed on 01/06/2022 Low normal left ventricular systolic function with an EF of 50% Moderate concentric LVH Left ventricle mildly enlarged GLS -20.3% Diastolic Doppler parameters consistent with pseudonormalization (G2DD). Left atrium moderately enlarged Trivial TR Mild MR  No AR or PR Normal gradients; no valvular stenosis  LEFT HEART CATHETERIZATION AND CORONARY ANGIOGRAPHY performed on 10/07/2021 Abnormal stress test in the inferior distribution.  RCA is diffusely diseased.  Wired and pre-dilated with a 2.0 balloon.  IVUS demonstrated a 2.75-3.0 balloon distally and a 3.3-3.5 diameter proximally.  Stented with a 2.75 x 38 distally and a 3.0 x 48 proximally.  Post-dilated with a 3.0 [place] distally and a 3.5  proximally.  Patient loaded with aspirin and plavix on the table.    PET MYOCARDIAL PERFUSION (STRESS AND REST) WITH CONCURRENT CT performed on 09/27/2021 Evidence of a large area of ischemia but no infarct by visual analysis Very abnormal global myocardial blood flow reserve Rest Myocardial Blood Flow - 0.7 ml/min/g. Stress Myocardial Blood Flow: 0.6 ml/min/g.  Myocardial Blood Flow Reserve: 1.1 (Normal = or >2).   Decreased left ventricular ejection fraction Rest LVEF: 42% Post stress LVEF: 42% Abnormal wall motion Anterior: Normal Apex: Severe - Hypokinetic Inferior: Mild - Hypokinetic Lateral: Normal Septal: Normal Increased volumes Stress EDV: 194 ml EDVI: 108 ml/m Stress ESV: 112 ml ESVI: 62 ml/m  Rest EDV: 183 ml EDVI: 102 ml/m  Rest ESV: 107 ml ESVI: 59 ml/m   Impression and Plan:  Kelli [name] has been referred for pre-anesthesia review and clearance prior to her undergoing the planned anesthetic and procedural courses. Available labs, pertinent testing, and imaging results were personally reviewed by me. This patient has been appropriately cleared by cardiology with an overall ACCEPTABLE risk of significant perioperative cardiovascular  complications.  Based on clinical review performed today (03/27/22), barring any significant acute changes in the patient's overall condition, it is anticipated that she will be able to proceed with the planned surgical intervention.  Any acute changes in clinical condition may necessitate her procedure being postponed and/or cancelled. Patient will meet with anesthesia team (MD and/or CRNA) on the day of her procedure for preoperative evaluation/assessment. Questions regarding anesthetic course will be fielded at that time.   Pre-surgical instructions were reviewed with the patient during her PAT appointment and questions were fielded by PAT clinical staff. Patient was advised that if any questions or concerns arise prior to her procedure then she should return a call to PAT and/or her surgeon's office to discuss.  Honor Loh, MSN, APRN, FNP-C, CEN [facility]  Peri-operative Services Nurse Practitioner Phone: [phone] Fax: [phone] 03/27/22 4:48 PM  NOTE: This note has been prepared using Dragon dictation software. Despite my best ability to proofread, there is always the potential that unintentional transcriptional errors may still occur from this process.

## 2022-03-26 NOTE — Progress Notes (Signed)
  Perioperative Services Pre-Admission/Anesthesia Testing     Date: 03/26/22  Name: Kelli [name] MRN:   [id number]  Re: Change in [place] for upcoming surgery   Case: [id number] Date/Time: 03/28/22 0932   Procedure: INSERTION OF ARTERIOVENOUS (AV) GORE-TEX GRAFT ARM (BRACHIAL AXILLARY) (Left)   Anesthesia type: General   Pre-op diagnosis: ESRD   Location: [facility] OR ROOM 08 / [place] [facility]S FOR ANESTHESIA GROUP   Surgeons: Katha Cabal, MD   Primary attending surgeon was consulted regarding consideration of therapeutic change in antimicrobial agent being used for preoperative prophylaxis in this patient's upcoming surgical case. Following analysis of the risk versus benefits, the patient's primary attending surgeon advised that it would be acceptable to discontinue the ordered vancomycin and place an order for cefazolin 2 gm IV on call to the OR. Orders for this patient were amended by me following collaborative conversation with attending surgeon taking into consideration of risk versus benefits associated with the change in therapy.  Honor Loh, MSN, APRN, FNP-C, CEN [facility]  Peri-operative Services Nurse Practitioner Phone: [phone] 03/26/22 9:59 AM

## 2022-03-26 NOTE — Progress Notes (Signed)
Perioperative Services Pre-Admission/Anesthesia Testing   Date: 03/26/22 Name: Kelli [name] MRN:   [id number]  Re: Consideration of preoperative prophylactic antibiotic change   Request sent to: Hortencia Pilar, MD (routed and/or faxed via [facility])       Eulogio Ditch, NP-C (routed and/or faxed via [facility])  Planned Surgical Procedure(s):    Case: [id number] Date/Time: 03/28/22 0932   Procedure: INSERTION OF ARTERIOVENOUS (AV) GORE-TEX GRAFT ARM (BRACHIAL AXILLARY) (Left)   Anesthesia type: General   Pre-op diagnosis: ESRD   Location: [place] [facility] / [facility] ORS FOR ANESTHESIA GROUP   Surgeons: Katha Cabal, MD   Clinical Notes:  Patient has a documented allergy/intolerance to PCN  Advising that PCN has caused her to experience GI symptoms (nausea and vomiting) in the past.   EMR review indicated that patient received PCN and/or cephalosporin in the past as follows: CEFAZOLIN received on 05/30/2021 and 12/06/2021 with no documented ADRs.   Screened as appropriate for cephalosporin use during medication reconciliation No immediate angioedema, dysphagia, SOB, anaphylaxis symptoms. No severe rash involving mucous membranes or skin necrosis. No hospital admissions related to side effects of PCN/cephalosporin use.  No documented reaction to PCN or cephalosporin in the last 10 years.  Request:  As an evidence based approach to reducing the rate of incidence for post-operative SSI and the development of MDROs, could an agent that allows for narrower antimicrobial coverage for preoperative prophylaxis in this patient's upcoming surgical course be considered?   Currently ordered preoperative prophylactic ABX: vancomycin.   Specifically requesting change to cephalosporin (CEFAZOLIN).  Drug of choice for many procedures; it is the most widely studied antimicrobial agent with proven efficacy for antimicrobial prophylaxis.   Desirable duration of action, spectrum of activity against  organisms commonly encountered in surgery, and it has an excellent safety profile and low cost.   Active against streptococci, methicillin-susceptible staphylococci, and many gram-negative organisms.  Please communicate decision with me and I will change the orders in Epic as per your direction.   Things to consider: Many patients report that they were "allergic" to PCN earlier in life, however this does not translate into a true lifelong allergy. Patients can lose sensitivity to specific IgE antibodies over time if PCN is avoided (Kleris & Lugar, 2019).  Up to 10% of the adult population and 15% of hospitalized patients report an allergy to PCN, however clinical studies suggest that 90% of those reporting an allergy can tolerate PCN antibiotics (Kleris & Lugar, 2019).  Cross-sensitivity between PCN and cephalosporins has been documented as being as high as 10%, however this estimation included data believed to have been collected in a setting where there was contamination. Newer data suggests that the prevalence of cross-sensitivity between PCN and cephalosporins is actually estimated to be closer to 1% (Hermanides et al., 2018).   Patients labeled as PCN allergic, whether they are truly allergic or not, have been found to have inferior outcomes in terms of rates of serious infection, and these patients tend to have longer hospital stays (Lueders, 2019).  Treatment related secondary infections, such as Clostridioides difficile, have been linked to the improper use of broad spectrum antibiotics in patients improperly labeled as PCN allergic (Kleris & Lugar, 2019).  Anaphylaxis from cephalosporins is rare and the evidence suggests that there is no increased risk of an anaphylactic type reaction when cephalosporins are used in a PCN allergic patient (Pichichero, 2006).  Citations: Hermanides J, Lemkes BA, Prins Pearla Dubonnet MW, Terreehorst I. Presumed ?-  Lactam Allergy and Cross-reactivity in the  Operating Theater: A Practical Approach. Anesthesiology. 2018 Aug;129(2):335-342. doi: 10.1097/ALN.0000000000002252. PMID: 11464314.  Kleris, [place]., & Lugar, P. L. (2019). Things We Do For No Reason: Failing to Question a Penicillin Allergy History. Journal of hospital medicine, 14(10), 5193118570. Advance online publication. https://www.wallace-middleton.info/  Pichichero, M. E. (2006). Cephalosporins can be prescribed safely for penicillin-allergic patients. Journal of family medicine, 55(2), 106-112. Accessed: https://cdn.mdedge.com/files/s54f-public/Document/September-2017/5502JFP_AppliedEvidence1.pdf   BHonor Loh MSN, APRN, FNP-C, CEN [facility] Peri-operative Services Nurse Practitioner FAX: ([phone]/02/23 6:46 AM

## 2022-03-27 ENCOUNTER — Encounter: Payer: Self-pay | Admitting: Vascular Surgery

## 2022-03-27 ENCOUNTER — Telehealth: Payer: Self-pay | Admitting: Urgent Care

## 2022-03-27 DIAGNOSIS — N2581 Secondary hyperparathyroidism of renal origin: Secondary | ICD-10-CM | POA: Diagnosis not present

## 2022-03-27 DIAGNOSIS — R110 Nausea: Secondary | ICD-10-CM | POA: Diagnosis not present

## 2022-03-27 DIAGNOSIS — N186 End stage renal disease: Secondary | ICD-10-CM | POA: Diagnosis not present

## 2022-03-27 DIAGNOSIS — Z23 Encounter for immunization: Secondary | ICD-10-CM | POA: Diagnosis not present

## 2022-03-27 DIAGNOSIS — Z992 Dependence on renal dialysis: Secondary | ICD-10-CM | POA: Diagnosis not present

## 2022-03-27 MED ORDER — CHLORHEXIDINE GLUCONATE 0.12 % MT SOLN: 15.0000 mL | Freq: Once | OROMUCOSAL | Status: AC

## 2022-03-27 MED ORDER — CEFAZOLIN SODIUM-DEXTROSE 2-4 GM/100ML-% IV SOLN
2.0000 g | Freq: Once | INTRAVENOUS | Status: AC
  Administered 2022-03-28: 2 g via INTRAVENOUS

## 2022-03-27 MED ORDER — CHLORHEXIDINE GLUCONATE CLOTH 2 % EX PADS: 6.0000 | MEDICATED_PAD | Freq: Once | CUTANEOUS | Status: DC

## 2022-03-27 MED ORDER — ORAL CARE MOUTH RINSE: 15.0000 mL | Freq: Once | OROMUCOSAL | Status: AC

## 2022-03-27 MED ORDER — SODIUM CHLORIDE 0.9 % IV SOLN: INTRAVENOUS | Status: DC

## 2022-03-27 NOTE — Progress Notes (Signed)
  Perioperative Services Pre-Admission/Anesthesia Testing    Date: 03/27/22  Name: Kelli [name] MRN:   [id number]  Re: Attempting to obtain cardiac clearance for vascular surgery  Patient is scheduled for the above procedure on 03/28/2022 with Dr. Hortencia Pilar, MD.  Given patient's past medical history significant for cardiovascular diagnoses, attempted to solicit input from patient's primary cardiologist prior to her undergoing general anesthesia for the planned surgical intervention.  Clearance documentation was faxed to the cardiology office on 03/25/2022 and again on 03/27/2022.  Surgery was scheduled quickly, thus allowing for limited time to obtain necessary clearance. Given the fact that her surgery is scheduled for 03/28/2022, I reached out to cardiology office around noon on 03/27/2022 to determine whether or not clearance will be granted for the planned procedure.  Unable to speak with staff member; [facility].  Message was received by Colletta Maryland, RN with [facility] Cardiology as per note in Care Everywhere.  Returned call to [place], however I could not speak with her. I, once again, [facility] advising that procedure is tomorrow and that cardiac clearance for procedure is being requested. Asked RN to confirm receipt of faxed clearance form. If it has not been received, I asked that she send me a fax number for immediate attention matters. Office phone number left, however she was advised that I would be in and out; asked to leave message. Additionally, I provided my [place] email address and fax number for follow up.   Will plan on following up with [facility] Cardiology regarding need for clearance later this afternoon. Will update anesthesia clearance/review note with disposition from cardiology team.   Honor Loh, MSN, APRN, FNP-C, [place]  Peri-operative Services Nurse Practitioner Phone: [phone] 03/27/22 1:34 PM  NOTE: This note has been prepared using Dragon  dictation software. Despite my best ability to proofread, there is always the potential that unintentional transcriptional errors may still occur from this process.

## 2022-03-27 NOTE — Telephone Encounter (Signed)
Left voicemail to call in and schedule yearly physical.   Last one was 04/10/2021. A 30 day courtesy refill given for the Lexapro 5 mg.

## 2022-03-28 ENCOUNTER — Encounter
Admission: RE | Disposition: A | Discharge status: Home / Self Care | Payer: Self-pay | Source: Ambulatory Visit | Attending: Vascular Surgery

## 2022-03-28 ENCOUNTER — Encounter: Payer: Self-pay | Admitting: Vascular Surgery

## 2022-03-28 ENCOUNTER — Ambulatory Visit: Payer: BC Managed Care – PPO | Admitting: Urgent Care

## 2022-03-28 ENCOUNTER — Ambulatory Visit
Admission: RE | Admit: 2022-03-28 | Discharge: 2022-03-28 | Disposition: A | Discharge status: Home / Self Care | Payer: BC Managed Care – PPO | Source: Ambulatory Visit | Attending: Vascular Surgery | Admitting: Vascular Surgery

## 2022-03-28 ENCOUNTER — Other Ambulatory Visit: Payer: Self-pay

## 2022-03-28 DIAGNOSIS — Z794 Long term (current) use of insulin: Secondary | ICD-10-CM | POA: Diagnosis not present

## 2022-03-28 DIAGNOSIS — E1022 Type 1 diabetes mellitus with diabetic chronic kidney disease: Secondary | ICD-10-CM | POA: Diagnosis not present

## 2022-03-28 DIAGNOSIS — E1042 Type 1 diabetes mellitus with diabetic polyneuropathy: Secondary | ICD-10-CM

## 2022-03-28 DIAGNOSIS — Z992 Dependence on renal dialysis: Secondary | ICD-10-CM | POA: Insufficient documentation

## 2022-03-28 DIAGNOSIS — I120 Hypertensive chronic kidney disease with stage 5 chronic kidney disease or end stage renal disease: Secondary | ICD-10-CM | POA: Insufficient documentation

## 2022-03-28 DIAGNOSIS — N186 End stage renal disease: Secondary | ICD-10-CM

## 2022-03-28 DIAGNOSIS — E1122 Type 2 diabetes mellitus with diabetic chronic kidney disease: Secondary | ICD-10-CM | POA: Diagnosis not present

## 2022-03-28 DIAGNOSIS — D631 Anemia in chronic kidney disease: Secondary | ICD-10-CM | POA: Diagnosis not present

## 2022-03-28 HISTORY — DX: End stage renal disease: Z99.2

## 2022-03-28 HISTORY — DX: Anemia in chronic kidney disease: D63.1

## 2022-03-28 HISTORY — DX: Long term (current) use of antithrombotics/antiplatelets: Z79.02

## 2022-03-28 HISTORY — DX: Dependence on renal dialysis: N18.6

## 2022-03-28 HISTORY — DX: Chronic kidney disease, unspecified: N18.9

## 2022-03-28 HISTORY — DX: Other specified diabetes mellitus without complications: E13.9

## 2022-03-28 HISTORY — PX: AV FISTULA PLACEMENT: SHX1204

## 2022-03-28 LAB — POCT I-STAT, CHEM 8
BUN: 18 mg/dL (ref 8–23)
Calcium, Ion: 1.05 mmol/L — ABNORMAL LOW (ref 1.15–1.40)
Chloride: 102 mmol/L (ref 98–111)
Creatinine, Ser: 5.2 mg/dL — ABNORMAL HIGH (ref 0.44–1.00)
Glucose, Bld: 320 mg/dL — ABNORMAL HIGH (ref 70–99)
HCT: 39 % (ref 36.0–46.0)
Hemoglobin: 13.3 g/dL (ref 12.0–15.0)
Potassium: 4.5 mmol/L (ref 3.5–5.1)
Sodium: 137 mmol/L (ref 135–145)
TCO2: 24 mmol/L (ref 22–32)

## 2022-03-28 LAB — GLUCOSE, CAPILLARY: Glucose-Capillary: 302 mg/dL — ABNORMAL HIGH (ref 70–99)

## 2022-03-28 SURGERY — ARTERIOVENOUS (AV) FISTULA CREATION
Anesthesia: General | Laterality: Left

## 2022-03-28 MED ORDER — SODIUM CHLORIDE 0.9 % IV SOLN
12.5000 mg | Freq: Once | INTRAVENOUS | Status: DC
  Filled 2022-03-28: qty 0.5

## 2022-03-28 MED ORDER — DEXMEDETOMIDINE (PRECEDEX) IN NS 20 MCG/5ML (4 MCG/ML) IV SYRINGE
PREFILLED_SYRINGE | INTRAVENOUS | Status: DC | PRN
  Administered 2022-03-28: 4 ug via INTRAVENOUS
  Administered 2022-03-28: 8 ug via INTRAVENOUS
  Administered 2022-03-28: 4 ug via INTRAVENOUS

## 2022-03-28 MED ORDER — FENTANYL CITRATE (PF) 100 MCG/2ML IJ SOLN
INTRAMUSCULAR | Status: AC
  Filled 2022-03-28: qty 2

## 2022-03-28 MED ORDER — HYDROCODONE-ACETAMINOPHEN 5-325 MG PO TABS
1.0000 | ORAL_TABLET | Freq: Four times a day (QID) | ORAL | 0 refills | Status: DC | PRN
Start: 2022-03-28 — End: 2022-10-24

## 2022-03-28 MED ORDER — BUPIVACAINE LIPOSOME 1.3 % IJ SUSP
INTRAMUSCULAR | Status: AC
  Filled 2022-03-28: qty 20

## 2022-03-28 MED ORDER — SODIUM CHLORIDE 0.9 % IV SOLN
12.5000 mg | Freq: Once | INTRAVENOUS | Status: AC
  Administered 2022-03-28: 12.5 mg via INTRAVENOUS
  Filled 2022-03-28: qty 12.5

## 2022-03-28 MED ORDER — ONDANSETRON HCL 4 MG/2ML IJ SOLN
INTRAMUSCULAR | Status: AC
Start: 2022-03-28 — End: ?
  Filled 2022-03-28: qty 2

## 2022-03-28 MED ORDER — HEMOSTATIC AGENTS (NO CHARGE) OPTIME
TOPICAL | Status: DC | PRN
  Administered 2022-03-28: 1 via TOPICAL

## 2022-03-28 MED ORDER — ACETAMINOPHEN 10 MG/ML IV SOLN
INTRAVENOUS | Status: DC | PRN
  Administered 2022-03-28: 1000 mg via INTRAVENOUS

## 2022-03-28 MED ORDER — ROCURONIUM BROMIDE 100 MG/10ML IV SOLN
INTRAVENOUS | Status: DC | PRN
  Administered 2022-03-28: 20 mg via INTRAVENOUS
  Administered 2022-03-28: 10 mg via INTRAVENOUS

## 2022-03-28 MED ORDER — SUGAMMADEX SODIUM 200 MG/2ML IV SOLN
INTRAVENOUS | Status: DC | PRN
  Administered 2022-03-28: 140 mg via INTRAVENOUS

## 2022-03-28 MED ORDER — OXYCODONE HCL 5 MG/5ML PO SOLN: 5.0000 mg | Freq: Once | ORAL | Status: DC | PRN

## 2022-03-28 MED ORDER — ACETAMINOPHEN 10 MG/ML IV SOLN: 1000.0000 mg | Freq: Once | INTRAVENOUS | Status: DC | PRN

## 2022-03-28 MED ORDER — ONDANSETRON HCL 4 MG/2ML IJ SOLN: 4.0000 mg | Freq: Four times a day (QID) | INTRAMUSCULAR | Status: DC | PRN

## 2022-03-28 MED ORDER — ONDANSETRON HCL 4 MG/2ML IJ SOLN
INTRAMUSCULAR | Status: DC | PRN
  Administered 2022-03-28: 4 mg via INTRAVENOUS

## 2022-03-28 MED ORDER — CEFAZOLIN SODIUM-DEXTROSE 2-4 GM/100ML-% IV SOLN
INTRAVENOUS | Status: AC
  Filled 2022-03-28: qty 100

## 2022-03-28 MED ORDER — CHLORHEXIDINE GLUCONATE 0.12 % MT SOLN
OROMUCOSAL | Status: AC
  Administered 2022-03-28: 15 mL via OROMUCOSAL
  Filled 2022-03-28: qty 15

## 2022-03-28 MED ORDER — BUPIVACAINE LIPOSOME 1.3 % IJ SUSP
INTRAMUSCULAR | Status: DC | PRN
  Administered 2022-03-28: 23 mL

## 2022-03-28 MED ORDER — HYDROMORPHONE HCL 1 MG/ML IJ SOLN: 1.0000 mg | Freq: Once | INTRAMUSCULAR | Status: DC | PRN

## 2022-03-28 MED ORDER — FENTANYL CITRATE (PF) 100 MCG/2ML IJ SOLN
INTRAMUSCULAR | Status: DC | PRN
  Administered 2022-03-28: 100 ug via INTRAVENOUS

## 2022-03-28 MED ORDER — PROMETHAZINE HCL 25 MG/ML IJ SOLN
INTRAMUSCULAR | Status: AC
  Filled 2022-03-28: qty 1

## 2022-03-28 MED ORDER — MIDAZOLAM HCL 2 MG/2ML IJ SOLN
INTRAMUSCULAR | Status: DC | PRN
  Administered 2022-03-28 (×2): 1 mg via INTRAVENOUS

## 2022-03-28 MED ORDER — FENTANYL CITRATE (PF) 100 MCG/2ML IJ SOLN: 25.0000 ug | INTRAMUSCULAR | Status: DC | PRN

## 2022-03-28 MED ORDER — PAPAVERINE HCL 30 MG/ML IJ SOLN
INTRAMUSCULAR | Status: AC
  Filled 2022-03-28: qty 2

## 2022-03-28 MED ORDER — LIDOCAINE HCL (CARDIAC) PF 100 MG/5ML IV SOSY
PREFILLED_SYRINGE | INTRAVENOUS | Status: DC | PRN
  Administered 2022-03-28: 100 mg via INTRAVENOUS

## 2022-03-28 MED ORDER — ACETAMINOPHEN 10 MG/ML IV SOLN
INTRAVENOUS | Status: AC
Start: 2022-03-28 — End: ?
  Filled 2022-03-28: qty 100

## 2022-03-28 MED ORDER — PROPOFOL 1000 MG/100ML IV EMUL
INTRAVENOUS | Status: AC
  Filled 2022-03-28: qty 100

## 2022-03-28 MED ORDER — HEPARIN 5000 UNITS IN NS 1000 ML (FLUSH)
INTRAMUSCULAR | Status: DC | PRN
  Administered 2022-03-28: 200 mL via INTRAMUSCULAR

## 2022-03-28 MED ORDER — OXYCODONE HCL 5 MG PO TABS: 5.0000 mg | ORAL_TABLET | Freq: Once | ORAL | Status: DC | PRN

## 2022-03-28 MED ORDER — ONDANSETRON HCL 4 MG/2ML IJ SOLN: 4.0000 mg | Freq: Once | INTRAMUSCULAR | Status: DC | PRN

## 2022-03-28 MED ORDER — HEPARIN SODIUM (PORCINE) 5000 UNIT/ML IJ SOLN
INTRAMUSCULAR | Status: AC
  Filled 2022-03-28: qty 1

## 2022-03-28 MED ORDER — MIDAZOLAM HCL 2 MG/2ML IJ SOLN
INTRAMUSCULAR | Status: AC
  Filled 2022-03-28: qty 2

## 2022-03-28 MED ORDER — SUCCINYLCHOLINE CHLORIDE 200 MG/10ML IV SOSY
PREFILLED_SYRINGE | INTRAVENOUS | Status: DC | PRN
  Administered 2022-03-28: 100 mg via INTRAVENOUS

## 2022-03-28 MED ORDER — PROPOFOL 10 MG/ML IV BOLUS
INTRAVENOUS | Status: DC | PRN
  Administered 2022-03-28: 130 mg via INTRAVENOUS

## 2022-03-28 MED ORDER — DEXAMETHASONE SODIUM PHOSPHATE 10 MG/ML IJ SOLN
INTRAMUSCULAR | Status: DC | PRN
  Administered 2022-03-28: 5 mg via INTRAVENOUS

## 2022-03-28 MED ORDER — PROPOFOL 500 MG/50ML IV EMUL
INTRAVENOUS | Status: DC | PRN
  Administered 2022-03-28: 150 ug/kg/min via INTRAVENOUS

## 2022-03-28 SURGICAL SUPPLY — 54 items
ADH SKN CLS APL DERMABOND .7 (GAUZE/BANDAGES/DRESSINGS) ×1
APL PRP STRL LF DISP 70% ISPRP (MISCELLANEOUS) ×1
BAG DECANTER FOR FLEXI CONT (MISCELLANEOUS) ×2 IMPLANT
BLADE SURG SZ11 CARB STEEL (BLADE) ×2 IMPLANT
BOOT SUTURE AID YELLOW STND (SUTURE) ×2 IMPLANT
BRUSH SCRUB EZ  4% CHG (MISCELLANEOUS) ×1
BRUSH SCRUB EZ 4% CHG (MISCELLANEOUS) ×1 IMPLANT
CHLORAPREP W/TINT 26 (MISCELLANEOUS) ×2 IMPLANT
DERMABOND ADVANCED (GAUZE/BANDAGES/DRESSINGS) ×1
DERMABOND ADVANCED .7 DNX12 (GAUZE/BANDAGES/DRESSINGS) ×1 IMPLANT
DRESSING SURGICEL FIBRLLR 1X2 (HEMOSTASIS) ×1 IMPLANT
DRSG SURGICEL FIBRILLAR 1X2 (HEMOSTASIS) ×2
ELECT CAUTERY BLADE 6.4 (BLADE) ×2 IMPLANT
ELECT REM PT RETURN 9FT ADLT (ELECTROSURGICAL) ×2
ELECTRODE REM PT RTRN 9FT ADLT (ELECTROSURGICAL) ×1 IMPLANT
GLOVE SURG SYN 8.0 (GLOVE) ×2 IMPLANT
GLOVE SURG SYN 8.0 PF PI (GLOVE) ×1 IMPLANT
GOWN STRL REUS W/ TWL LRG LVL3 (GOWN DISPOSABLE) ×1 IMPLANT
GOWN STRL REUS W/ TWL XL LVL3 (GOWN DISPOSABLE) ×1 IMPLANT
GOWN STRL REUS W/TWL LRG LVL3 (GOWN DISPOSABLE) ×2
GOWN STRL REUS W/TWL XL LVL3 (GOWN DISPOSABLE) ×2
IV NS 500ML (IV SOLUTION) ×2
IV NS 500ML BAXH (IV SOLUTION) ×1 IMPLANT
KIT TURNOVER KIT A (KITS) ×2 IMPLANT
LABEL OR SOLS (LABEL) ×2 IMPLANT
LOOP RED MAXI  1X406MM (MISCELLANEOUS) ×1
LOOP VESSEL MAXI 1X406 RED (MISCELLANEOUS) ×1 IMPLANT
LOOP VESSEL MINI 0.8X406 BLUE (MISCELLANEOUS) ×2 IMPLANT
LOOPS BLUE MINI 0.8X406MM (MISCELLANEOUS) ×2
MANIFOLD NEPTUNE II (INSTRUMENTS) ×2 IMPLANT
NDL FILTER BLUNT 18X1 1/2 (NEEDLE) ×1 IMPLANT
NEEDLE FILTER BLUNT 18X 1/2SAF (NEEDLE) ×1
NEEDLE FILTER BLUNT 18X1 1/2 (NEEDLE) ×1 IMPLANT
NS IRRIG 500ML POUR BTL (IV SOLUTION) ×2 IMPLANT
PACK EXTREMITY ARMC (MISCELLANEOUS) ×2 IMPLANT
PAD PREP 24X41 OB/GYN DISP (PERSONAL CARE ITEMS) ×2 IMPLANT
STOCKINETTE 48X4 2 PLY STRL (GAUZE/BANDAGES/DRESSINGS) ×1 IMPLANT
STOCKINETTE STRL 4IN 9604848 (GAUZE/BANDAGES/DRESSINGS) ×2 IMPLANT
SUT GTX CV-6 30 (SUTURE) ×2 IMPLANT
SUT MNCRL+ 5-0 UNDYED PC-3 (SUTURE) ×1 IMPLANT
SUT MONOCRYL 5-0 (SUTURE) ×1
SUT PROLENE 6 0 BV (SUTURE) ×4 IMPLANT
SUT SILK 2 0 (SUTURE) ×2
SUT SILK 2 0 SH (SUTURE) ×2 IMPLANT
SUT SILK 2-0 18XBRD TIE 12 (SUTURE) ×1 IMPLANT
SUT SILK 3 0 (SUTURE) ×2
SUT SILK 3-0 18XBRD TIE 12 (SUTURE) ×1 IMPLANT
SUT SILK 4 0 (SUTURE) ×2
SUT SILK 4-0 18XBRD TIE 12 (SUTURE) ×1 IMPLANT
SUT VIC AB 3-0 SH 27 (SUTURE) ×4
SUT VIC AB 3-0 SH 27X BRD (SUTURE) ×2 IMPLANT
SYR 20ML LL LF (SYRINGE) ×2 IMPLANT
SYR 3ML LL SCALE MARK (SYRINGE) ×2 IMPLANT
WATER STERILE IRR 500ML POUR (IV SOLUTION) ×2 IMPLANT

## 2022-03-28 NOTE — Anesthesia Preprocedure Evaluation (Signed)
Anesthesia Evaluation  Patient identified by MRN, date of birth, ID band Patient awake  General Assessment Comment:  Patient sitting up, with occasional dry heaves  Reviewed: Allergy & Precautions, NPO status , Patient's Chart, lab work & pertinent test results  History of Anesthesia Complications Negative for: history of anesthetic complications  Airway Mallampati: II  TM Distance: >3 FB Neck ROM: Full    Dental no notable dental hx. (+) Teeth Intact   Pulmonary neg pulmonary ROS, neg sleep apnea, neg COPD, Patient abstained from smoking.Not current smoker, former smoker,    Pulmonary exam normal breath sounds clear to auscultation       Cardiovascular Exercise Tolerance: Poor METS: < 3 Mets hypertension, Pt. on medications + angina + CAD and + Cardiac Stents  (-) Past MI (-) dysrhythmias  Rhythm:Regular Rate:Normal - Systolic murmurs Cardiologist deems this patient an "acceptable" risk for anesthetic.  Poorly controlled HTN, patient has not taken her meds for two days.   PET myocardial perfusion imaging study performed on 09/27/2021 revealing evidence of a large area of ischemia, but no concern for infarct.  There was very abnormal global myocardial blood flow reserve; 0.7 mL/min/g (rest), 0.6 mL/min/g (stress), 1.1 (myocardial blood flow reserve; normal =/>2). Left ventricular ejection fraction decreased at 42%.  There was severe apical and mild inferior wall hypokinesis.  ? Patient underwent diagnostic left heart catheterization on 10/09/2021 revealing multivessel CAD; 50% D2, 50% mid LCx, 70% proximal RCA, 70% mid RCA, and 70% distal RCA. PCI was subsequently performed placing a 3.0 x 48 mm proximal RCA and a 2.75 x 38 mm distal RCA DES yielding excellent angiographic result and TIMI-3 flow.  ? Most recent TTE performed on 01/06/2022 revealed a mildly reduced left ventricular systolic function with an EF of 48%.  There was  mild LVH and moderate left atrial enlargement.  GLS -10.7%. Diastolic Doppler parameters consistent with pseudonormalization (G2DD). There was trivial tricuspid and mild mitral valve regurgitation.  There was no evidence of a significant transvalvular gradient to suggest stenosis.   Neuro/Psych  Headaches, PSYCHIATRIC DISORDERS Depression    GI/Hepatic PUD, GERD  Poorly Controlled,(+)     (-) substance abuse  ,   Endo/Other  diabetes, Poorly Controlled, Insulin Dependent  Renal/GU ESRFRenal diseaseLast dialyzed yesterday     Musculoskeletal   Abdominal   Peds  Hematology  (+) REFUSES BLOOD PRODUCTS, JEHOVAH'S WITNESS  Anesthesia Other Findings Past Medical History: No date: Anemia of chronic renal failure     Comment:  a.) on epoetin injections per nephrology No date: Anginal pain ([place]) No date: Blood transfusion declined because patient is Jehovah's  Witness No date: Cancer ([place]) No date: Charcot foot due to diabetes mellitus ([place]) No date: Chorioretinal inflammation of both eyes 10/07/2021: Coronary artery disease     Comment:  a.) LHC/PCI 10/07/2021: diffusely diseased RCA --> 3.0 x              48 mm (proximal) and 2.75 x 38 mm (distal) DES x 2               (unknown type) placed No date: Depression No date: Diabetes mellitus type 1.5 ([place]) 24/26/8341: Diastolic dysfunction     Comment:  a.) TTE 09/26/2021: EF 40%, sep/inf/post HK, mod LVH,               GLS -10.1%, mild LAE, triv MR, G2DD; b.) TTE 01/06/2022:               EF  48%, LVH, GLS -10.7%, moderate LAE, trivial TR, mild               MR, G2DD No date: ESRD (end stage renal disease) on dialysis (HCC)     Comment:  a.) T-Th-Sat No date: GERD (gastroesophageal reflux disease) No date: 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, left eye, severe  stage No date: Hyperlipidemia No date: Hypertension No date: Long term current use of antithrombotics/antiplatelets     Comment:  a.) daily DAPT therapy (ASA +  clopidogrel) No date: Migraine No date: Neuropathy No date: PUD (peptic ulcer disease) No date: Staph infection  Reproductive/Obstetrics                             Anesthesia Physical Anesthesia Plan  ASA: 4  Anesthesia Plan: General   Post-op Pain Management: Ofirmev IV (intra-op)*   Induction: Intravenous and Rapid sequence  PONV Risk Score and Plan: 3 and Ondansetron, Dexamethasone, Midazolam, TIVA and Propofol infusion  Airway Management Planned: Oral ETT  Additional Equipment: None  Intra-op Plan:   Post-operative Plan: Extubation in OR  Informed Consent: I have reviewed the patients History and Physical, chart, labs and discussed the procedure including the risks, benefits and alternatives for the proposed anesthesia with the patient or authorized representative who has indicated his/her understanding and acceptance.     Dental advisory given  Plan Discussed with: CRNA and Surgeon  Anesthesia Plan Comments: (Patient endorses poorly controlled GERD, feels very symptomatic today, requesting phenergan. Will proceed with GETA with RSI. Patient with uncontrolled HTN, 200s/90s in preop. Per chart review, patient follows closely with her cardiologist who has been adjusting her meds to improve her BP control. As she likely lives at this pressure constantly, I advised her that delaying her procedure OR continuing with acknowledging increased risk of stroke/heartattack/death are both reasonable options. She elected to proceed, understanding the risks.  Discussed risks of anesthesia with patient, including PONV, sore throat, lip/dental/eye damage, aspiration. Rare risks discussed as well, such as cardiorespiratory and neurological sequelae, and allergic reactions. Discussed the role of CRNA in patient's perioperative care. Patient understands.)        Anesthesia Quick Evaluation

## 2022-03-28 NOTE — Anesthesia Postprocedure Evaluation (Signed)
Anesthesia Post Note  Patient: Kelli [name]  Procedure(s) Performed: CREATION OF ARTERIOVENOUS (AV) FISTULA (Left)  Patient location during evaluation: PACU Anesthesia Type: General Level of consciousness: awake and alert Pain management: pain level controlled Vital Signs Assessment: post-procedure vital signs reviewed and stable Respiratory status: spontaneous breathing, nonlabored ventilation, respiratory function stable and patient connected to nasal cannula oxygen Cardiovascular status: blood pressure returned to baseline and stable Postop Assessment: no apparent nausea or vomiting Anesthetic complications: no   No notable events documented.   Last Vitals:  Vitals:   03/28/22 1220 03/28/22 1232  BP: (!) 181/80 (!) 175/77  Pulse: 81 83  Resp: 19 15  Temp:    SpO2: 92% 94%    Last Pain:  Vitals:   03/28/22 1232  TempSrc:   PainSc: 0-No pain                 Arita Miss

## 2022-03-28 NOTE — Progress Notes (Signed)
Patient to Resume her Plavix tomorrow, per MD Schnier. Pt verbalized fully understanding. Continue to monitor.

## 2022-03-28 NOTE — Interval H&P Note (Signed)
History and Physical Interval Note:  03/28/2022 9:10 AM  Kelli [name]  has presented today for surgery, with the diagnosis of ESRD.  The various methods of treatment have been discussed with the patient and family. After consideration of risks, benefits and other options for treatment, the patient has consented to  Procedure(s): INSERTION OF ARTERIOVENOUS (AV) GORE-TEX GRAFT ARM (BRACHIAL AXILLARY) (Left) as a surgical intervention.  The patient's history has been reviewed, patient examined, no change in status, stable for surgery.  I have reviewed the patient's chart and labs.  Questions were answered to the patient's satisfaction.     Hortencia Pilar

## 2022-03-28 NOTE — Progress Notes (Signed)
1523 - Blood bank inquired about why Type and screen was not drawn.  Informed caller [name]) of patient's refusal of any and all blood products due to religous beliefs of being a Jehovah witness.  Potassium/glucose levels were drawn as an Scientist, research (medical)

## 2022-03-28 NOTE — Telephone Encounter (Signed)
Called pt left VM to call the office to schedule her CPE. Name is stated on VM.  KP

## 2022-03-28 NOTE — Anesthesia Procedure Notes (Addendum)
Procedure Name: Intubation Date/Time: 03/28/2022 9:39 AM  Performed by: Loletha Grayer, CRNAPre-anesthesia Checklist: Patient identified, Patient being monitored, Timeout performed, Emergency Drugs available and Suction available Patient Re-evaluated:Patient Re-evaluated prior to induction Oxygen Delivery Method: Circle system utilized Preoxygenation: Pre-oxygenation with 100% oxygen Induction Type: IV induction, Cricoid Pressure applied and Rapid sequence Laryngoscope Size: 3 and McGraph Grade View: Grade I Tube type: Oral Tube size: 6.5 mm Number of attempts: 1 Airway Equipment and Method: Stylet Placement Confirmation: ETT inserted through vocal cords under direct vision, positive ETCO2 and breath sounds checked- equal and bilateral Secured at: 21 cm Tube secured with: Tape Dental Injury: Teeth and Oropharynx as per pre-operative assessment

## 2022-03-28 NOTE — Op Note (Signed)
OPERATIVE NOTE   PROCEDURE: left brachial cephalic arteriovenous fistula placement  PRE-OPERATIVE DIAGNOSIS: End Stage Renal Disease  POST-OPERATIVE DIAGNOSIS: End Stage Renal Disease  SURGEON: Hortencia Pilar  ASSISTANT(S): none  ANESTHESIA: general  ESTIMATED BLOOD LOSS: <50 cc  FINDING(S): 4 mm cephalic vein  SPECIMEN(S):  none  INDICATIONS:   Kelli [name] is a 61 y.o. female who presents with end stage renal disease.  The patient is scheduled for left brachiocephalic arteriovenous fistula placement.  The patient is aware the risks include but are not limited to: bleeding, infection, steal syndrome, nerve damage, ischemic monomelic neuropathy, failure to mature, and need for additional procedures.  The patient is aware of the risks of the procedure and elects to proceed forward.  DESCRIPTION: After full informed written consent was obtained from the patient, the patient was brought back to the operating room and placed supine upon the operating table.  Prior to induction, the patient received IV antibiotics.   After obtaining adequate anesthesia, the patient was then prepped and draped in the standard fashion for a left arm access procedure.    A curvilinear incision was then created midway between the radial impulse and the cephalic vein. The cephalic vein was then identified and appeared to be at least 3-1/2 mm.  At this point, although she was scheduled for a brachial axillary AV graft I broke scrub and discussed with her husband the finding of a good vein and creation of a brachial cephalic fistula and the advantages that her fistula entails.  He agreed and wished for me to move forward with a brachiocephalic fistula creation.  Therefore I dissected the cephalic vein circumferentially. It was marked with a surgical marker.    Attention was then turned to the brachial artery which was exposed through the same incision and looped proximally and distally. Side branches  were controlled with 4-0 silk ties.  The distal segment of the vein was ligated with a  2-0 silk, and the vein was transected.  The proximal segment was interrogated with serial dilators.  The vein accepted up to a 4 mm dilator without any difficulty. Heparinized saline was infused into the vein and clamped it with a small bulldog.  At this point, I reset my exposure of the brachial artery and controlled the artery with vessel loops proximally and distally.  An arteriotomy was then made with a #11 blade, and extended with a Potts scissor.  Heparinized saline was injected proximal and distal into the radial artery.  The vein was then approximated to the artery while the artery was in its native bed and subsequently the vein was beveled using Potts scissors. The vein was then sewn to the artery in an end-to-side configuration with a running stitch of 6-0 Prolene.  Prior to completing this anastomosis Flushing maneuvers were performed and the artery was allowed to forward and back bleed.  There was no evidence of clot from any vessels.  I completed the anastomosis in the usual fashion and then released all vessel loops and clamps.    There was good  thrill in the venous outflow, and there was 1+ palpable radial pulse.  At this point, I irrigated out the surgical wound.  There was no further active bleeding.  The subcutaneous tissue was reapproximated with a running stitch of 3-0 Vicryl.  The skin was then reapproximated with a running subcuticular stitch of 4-0 Vicryl.  The skin was then cleaned, dried, and reinforced with Dermabond.  The patient tolerated this procedure well.   COMPLICATIONS: None  CONDITION: [name]  Office: [phone]   03/28/2022, 11:34 AM

## 2022-03-28 NOTE — Discharge Instructions (Signed)
AMBULATORY SURGERY  ?DISCHARGE INSTRUCTIONS ? ? ?The drugs that you were given will stay in your system until tomorrow so for the next 24 hours you should not: ? ?Drive an automobile ?Make any legal decisions ?Drink any alcoholic beverage ? ? ?You may resume regular meals tomorrow.  Today it is better to start with liquids and gradually work up to solid foods. ? ?You may eat anything you prefer, but it is better to start with liquids, then soup and crackers, and gradually work up to solid foods. ? ? ?Please notify your doctor immediately if you have any unusual bleeding, trouble breathing, redness and pain at the surgery site, drainage, fever, or pain not relieved by medication. ? ? ? ?Additional Instructions: ? ? ? ?Please contact your physician with any problems or Same Day Surgery at [phone], Monday through Friday 6 am to 4 pm, or [place] [facility] [place] Main number at [phone].  ?

## 2022-03-28 NOTE — Transfer of Care (Signed)
Immediate Anesthesia Transfer of Care Note  Patient: Kelli [name]  Procedure(s) Performed: CREATION OF ARTERIOVENOUS (AV) FISTULA (Left)  Patient Location: PACU  Anesthesia Type:General  Level of Consciousness: drowsy and patient cooperative  Airway & Oxygen Therapy: Patient Spontanous Breathing and Patient connected to face mask oxygen  Post-op Assessment: Report given to RN and Post -op Vital signs reviewed and stable  Post vital signs: Reviewed and stable  Last Vitals:  Vitals Value Taken Time  BP 142/67 03/28/22 1133  Temp    Pulse 79 03/28/22 1137  Resp 11 03/28/22 1137  SpO2 95 % 03/28/22 1137  Vitals shown include unvalidated device data.  Last Pain:  Vitals:   03/28/22 0850  TempSrc: Oral  PainSc: 0-No pain      Patients Stated Pain Goal: 0 (40/35/24 8185)  Complications: No notable events documented.

## 2022-03-28 NOTE — Progress Notes (Signed)
Patient is a Jehovah witness,with a refusal of all blood products; including Albumin.  Refusal on file.

## 2022-03-29 ENCOUNTER — Encounter: Payer: Self-pay | Admitting: Vascular Surgery

## 2022-03-29 DIAGNOSIS — Z23 Encounter for immunization: Secondary | ICD-10-CM | POA: Diagnosis not present

## 2022-03-29 DIAGNOSIS — N2581 Secondary hyperparathyroidism of renal origin: Secondary | ICD-10-CM | POA: Diagnosis not present

## 2022-03-29 DIAGNOSIS — R110 Nausea: Secondary | ICD-10-CM | POA: Diagnosis not present

## 2022-03-29 DIAGNOSIS — Z992 Dependence on renal dialysis: Secondary | ICD-10-CM | POA: Diagnosis not present

## 2022-03-29 DIAGNOSIS — N186 End stage renal disease: Secondary | ICD-10-CM | POA: Diagnosis not present

## 2022-04-01 DIAGNOSIS — N2581 Secondary hyperparathyroidism of renal origin: Secondary | ICD-10-CM | POA: Diagnosis not present

## 2022-04-01 DIAGNOSIS — R110 Nausea: Secondary | ICD-10-CM | POA: Diagnosis not present

## 2022-04-01 DIAGNOSIS — N186 End stage renal disease: Secondary | ICD-10-CM | POA: Diagnosis not present

## 2022-04-01 DIAGNOSIS — Z992 Dependence on renal dialysis: Secondary | ICD-10-CM | POA: Diagnosis not present

## 2022-04-03 DIAGNOSIS — R110 Nausea: Secondary | ICD-10-CM | POA: Diagnosis not present

## 2022-04-03 DIAGNOSIS — N186 End stage renal disease: Secondary | ICD-10-CM | POA: Diagnosis not present

## 2022-04-03 DIAGNOSIS — N2581 Secondary hyperparathyroidism of renal origin: Secondary | ICD-10-CM | POA: Diagnosis not present

## 2022-04-03 DIAGNOSIS — Z992 Dependence on renal dialysis: Secondary | ICD-10-CM | POA: Diagnosis not present

## 2022-04-04 DIAGNOSIS — Z992 Dependence on renal dialysis: Secondary | ICD-10-CM | POA: Diagnosis not present

## 2022-04-04 DIAGNOSIS — K2100 Gastro-esophageal reflux disease with esophagitis, without bleeding: Secondary | ICD-10-CM | POA: Diagnosis not present

## 2022-04-04 DIAGNOSIS — Z86010 Personal history of colonic polyps: Secondary | ICD-10-CM | POA: Diagnosis not present

## 2022-04-04 DIAGNOSIS — I2510 Atherosclerotic heart disease of native coronary artery without angina pectoris: Secondary | ICD-10-CM | POA: Diagnosis not present

## 2022-04-04 DIAGNOSIS — R112 Nausea with vomiting, unspecified: Secondary | ICD-10-CM | POA: Diagnosis not present

## 2022-04-04 DIAGNOSIS — E1121 Type 2 diabetes mellitus with diabetic nephropathy: Secondary | ICD-10-CM | POA: Diagnosis not present

## 2022-04-04 DIAGNOSIS — N186 End stage renal disease: Secondary | ICD-10-CM | POA: Diagnosis not present

## 2022-04-04 DIAGNOSIS — Z114 Encounter for screening for human immunodeficiency virus [HIV]: Secondary | ICD-10-CM | POA: Diagnosis not present

## 2022-04-04 DIAGNOSIS — Z1159 Encounter for screening for other viral diseases: Secondary | ICD-10-CM | POA: Diagnosis not present

## 2022-04-04 DIAGNOSIS — Z7902 Long term (current) use of antithrombotics/antiplatelets: Secondary | ICD-10-CM | POA: Diagnosis not present

## 2022-04-04 DIAGNOSIS — Z7682 Awaiting organ transplant status: Secondary | ICD-10-CM | POA: Diagnosis not present

## 2022-04-04 LAB — HIV ANTIBODY (ROUTINE TESTING W REFLEX): HIV 1&2 Ab, 4th Generation: NEGATIVE

## 2022-04-04 LAB — HEMOGLOBIN A1C: Hemoglobin A1C: 8.3

## 2022-04-05 DIAGNOSIS — N186 End stage renal disease: Secondary | ICD-10-CM | POA: Diagnosis not present

## 2022-04-05 DIAGNOSIS — Z992 Dependence on renal dialysis: Secondary | ICD-10-CM | POA: Diagnosis not present

## 2022-04-05 DIAGNOSIS — R110 Nausea: Secondary | ICD-10-CM | POA: Diagnosis not present

## 2022-04-05 DIAGNOSIS — N2581 Secondary hyperparathyroidism of renal origin: Secondary | ICD-10-CM | POA: Diagnosis not present

## 2022-04-08 DIAGNOSIS — Z992 Dependence on renal dialysis: Secondary | ICD-10-CM | POA: Diagnosis not present

## 2022-04-08 DIAGNOSIS — R110 Nausea: Secondary | ICD-10-CM | POA: Diagnosis not present

## 2022-04-08 DIAGNOSIS — N2581 Secondary hyperparathyroidism of renal origin: Secondary | ICD-10-CM | POA: Diagnosis not present

## 2022-04-08 DIAGNOSIS — N186 End stage renal disease: Secondary | ICD-10-CM | POA: Diagnosis not present

## 2022-04-10 DIAGNOSIS — N2581 Secondary hyperparathyroidism of renal origin: Secondary | ICD-10-CM | POA: Diagnosis not present

## 2022-04-10 DIAGNOSIS — Z992 Dependence on renal dialysis: Secondary | ICD-10-CM | POA: Diagnosis not present

## 2022-04-10 DIAGNOSIS — N186 End stage renal disease: Secondary | ICD-10-CM | POA: Diagnosis not present

## 2022-04-10 DIAGNOSIS — R110 Nausea: Secondary | ICD-10-CM | POA: Diagnosis not present

## 2022-04-12 DIAGNOSIS — Z992 Dependence on renal dialysis: Secondary | ICD-10-CM | POA: Diagnosis not present

## 2022-04-12 DIAGNOSIS — R110 Nausea: Secondary | ICD-10-CM | POA: Diagnosis not present

## 2022-04-12 DIAGNOSIS — N2581 Secondary hyperparathyroidism of renal origin: Secondary | ICD-10-CM | POA: Diagnosis not present

## 2022-04-12 DIAGNOSIS — N186 End stage renal disease: Secondary | ICD-10-CM | POA: Diagnosis not present

## 2022-04-15 ENCOUNTER — Telehealth: Payer: Self-pay | Admitting: Internal Medicine

## 2022-04-15 DIAGNOSIS — N2581 Secondary hyperparathyroidism of renal origin: Secondary | ICD-10-CM | POA: Diagnosis not present

## 2022-04-15 DIAGNOSIS — Z992 Dependence on renal dialysis: Secondary | ICD-10-CM | POA: Diagnosis not present

## 2022-04-15 DIAGNOSIS — N186 End stage renal disease: Secondary | ICD-10-CM | POA: Diagnosis not present

## 2022-04-15 DIAGNOSIS — R110 Nausea: Secondary | ICD-10-CM | POA: Diagnosis not present

## 2022-04-15 NOTE — Telephone Encounter (Signed)
Copied from [facility] and E [phone]. Topic: Referral - Request for Referral >> Apr 15, 2022 12:18 PM Oley Balm E wrote: Needs a mammogram and a colonoscopy soon   Wants to have Colonoscopy at [facility] in [facility] contact: [phone]

## 2022-04-15 NOTE — Telephone Encounter (Signed)
Called pt scheduled pt an appointment for a physical.  KP

## 2022-04-16 DIAGNOSIS — R110 Nausea: Secondary | ICD-10-CM | POA: Diagnosis not present

## 2022-04-16 DIAGNOSIS — N2581 Secondary hyperparathyroidism of renal origin: Secondary | ICD-10-CM | POA: Diagnosis not present

## 2022-04-16 DIAGNOSIS — N186 End stage renal disease: Secondary | ICD-10-CM | POA: Diagnosis not present

## 2022-04-16 DIAGNOSIS — Z992 Dependence on renal dialysis: Secondary | ICD-10-CM | POA: Diagnosis not present

## 2022-04-17 ENCOUNTER — Other Ambulatory Visit: Payer: Self-pay | Admitting: Internal Medicine

## 2022-04-17 DIAGNOSIS — N2581 Secondary hyperparathyroidism of renal origin: Secondary | ICD-10-CM | POA: Diagnosis not present

## 2022-04-17 DIAGNOSIS — Z992 Dependence on renal dialysis: Secondary | ICD-10-CM | POA: Diagnosis not present

## 2022-04-17 DIAGNOSIS — R110 Nausea: Secondary | ICD-10-CM | POA: Diagnosis not present

## 2022-04-17 DIAGNOSIS — G4733 Obstructive sleep apnea (adult) (pediatric): Secondary | ICD-10-CM | POA: Insufficient documentation

## 2022-04-17 DIAGNOSIS — N186 End stage renal disease: Secondary | ICD-10-CM | POA: Diagnosis not present

## 2022-04-18 ENCOUNTER — Encounter: Payer: BC Managed Care – PPO | Admitting: Internal Medicine

## 2022-04-18 ENCOUNTER — Ambulatory Visit (INDEPENDENT_AMBULATORY_CARE_PROVIDER_SITE_OTHER): Payer: BC Managed Care – PPO | Admitting: Internal Medicine

## 2022-04-18 ENCOUNTER — Encounter: Payer: Self-pay | Admitting: Internal Medicine

## 2022-04-18 VITALS — BP 142/82 | HR 81 | Ht 66.0 in | Wt 161.0 lb

## 2022-04-18 DIAGNOSIS — Z1211 Encounter for screening for malignant neoplasm of colon: Secondary | ICD-10-CM

## 2022-04-18 DIAGNOSIS — I10 Essential (primary) hypertension: Secondary | ICD-10-CM

## 2022-04-18 DIAGNOSIS — R112 Nausea with vomiting, unspecified: Secondary | ICD-10-CM | POA: Diagnosis not present

## 2022-04-18 DIAGNOSIS — F331 Major depressive disorder, recurrent, moderate: Secondary | ICD-10-CM

## 2022-04-18 DIAGNOSIS — Z1231 Encounter for screening mammogram for malignant neoplasm of breast: Secondary | ICD-10-CM | POA: Diagnosis not present

## 2022-04-18 DIAGNOSIS — E278 Other specified disorders of adrenal gland: Secondary | ICD-10-CM

## 2022-04-18 MED ORDER — PROMETHAZINE HCL 12.5 MG PO TABS: 12.5000 mg | ORAL_TABLET | Freq: Three times a day (TID) | ORAL | 0 refills | Status: DC | PRN

## 2022-04-18 NOTE — Progress Notes (Signed)
Date:  04/18/2022   Name:  Kelli [name]   DOB:  26-Mar-1961   MRN:  [id number]   Chief Complaint: Hypertension, Diabetes, and Referral (Mammogram and colonoscopy, needs completed to be able to stay on the transplant list) Last Colonscopy 2015 at [facility].    Hypertension This is a chronic problem. The problem is controlled. Pertinent negatives include no chest pain or shortness of breath. Past treatments include beta blockers and angiotensin blockers. ESRD.  Diabetes She presents for her follow-up diabetic visit. She has type 2 diabetes mellitus. Her disease course has been stable. Pertinent negatives for hypoglycemia include no nervousness/anxiousness. Associated symptoms include fatigue. Pertinent negatives for diabetes include no chest pain.    Lab Results  Component Value Date   NA 137 03/28/2022   K 4.5 03/28/2022   CO2 27 (A) 12/11/2021   GLUCOSE 320 (H) 03/28/2022   BUN 18 03/28/2022   CREATININE 5.20 (H) 03/28/2022   CALCIUM 7.5 (A) 12/11/2021   EGFR 11 12/11/2021   GFRNONAA 11 (L) 12/06/2021   Lab Results  Component Value Date   CHOL 218 (A) 12/11/2021   HDL 109 (A) 12/11/2021   LDLCALC 84 12/11/2021   TRIG 126 12/11/2021   CHOLHDL 4.2 04/10/2021   Lab Results  Component Value Date   TSH 0.619 05/26/2021   Lab Results  Component Value Date   HGBA1C 8.3 04/04/2022   Lab Results  Component Value Date   WBC 6.7 12/11/2021   HGB 13.3 03/28/2022   HCT 39.0 03/28/2022   MCV 93.0 12/06/2021   PLT 260 12/11/2021   Lab Results  Component Value Date   ALT 14 12/11/2021   AST 20 12/11/2021   ALKPHOS 63 12/11/2021   BILITOT 0.8 12/06/2021   No results found for: "25OHVITD2", "25OHVITD3", "VD25OH"   Review of Systems  Constitutional:  Positive for fatigue. Negative for chills, fever and unexpected weight change.  Respiratory:  Negative for chest tightness and shortness of breath.   Cardiovascular:  Negative for chest pain.  Gastrointestinal:  Positive for  nausea and vomiting.  Psychiatric/Behavioral:  Negative for dysphoric mood and sleep disturbance. The patient is not nervous/anxious.     Patient Active Problem List   Diagnosis Date Noted   OSA (obstructive sleep apnea) 04/17/2022   Coronary artery disease involving native coronary artery of native heart 10/07/2021   S/P drug eluting coronary stent placement 10/07/2021   ESRD on peritoneal dialysis ([place]) 08/06/2021   Peptic ulcer disease 08/06/2021   Chest pain    Intractable cyclical vomiting with nausea 03/31/2021   Refusal of blood transfusions as patient is Jehovah's Witness 02/27/2021   Dysuria 01/28/2021   Myalgia due to statin 10/12/2020   Status post hysterectomy 10/02/2020   Psychophysiological insomnia 05/30/2020   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, left eye, 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   Neovascular glaucoma of left eye, 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   Hyperlipidemia due to type 1 diabetes mellitus ([place]) 06/30/2019   Hypomagnesemia 06/30/2019   Neuropathy of left foot 03/18/2017   Arthrosis of midfoot, left 03/18/2017   Charcot foot due to diabetes mellitus ([place]) 02/09/2017   Venous insufficiency of both lower extremities 02/09/2017   Chronic idiopathic constipation 01/26/2017   Abnormal thyroid blood test 09/30/2016   Essential hypertension 08/21/2016   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   Temporomandibular joint pain 11/02/2014   Moderate episode of recurrent major depressive disorder ([place]) 11/02/2014   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[place]) 06/28/2014  Eustachian tube dysfunction 05/10/2014   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    Past Surgical History:  Procedure Laterality Date   ABDOMINAL HYSTERECTOMY     AV FISTULA PLACEMENT Left 03/28/2022   Procedure: CREATION OF ARTERIOVENOUS (AV) FISTULA;  Surgeon: Katha Cabal, MD;  Location: [facility];  Service: Vascular;  Laterality: Left;   CAPD INSERTION N/A 05/30/2021   Procedure: CONTINUOUS AMBULATORY PERITONEAL DIALYSIS  (CAPD) CATHETER INSERTION;  Surgeon: Jules Husbands, MD;  Location: [facility];  Service: General;  Laterality: N/A;   CAPD REMOVAL N/A 12/06/2021   Procedure: REMOVAL OF CONTINUOUS AMBULATORY PERITONEAL DIALYSIS  (CAPD) CATHETER;  Surgeon: Jules Husbands, MD;  Location: [facility];  Service: General;  Laterality: N/A;   COLONOSCOPY W/ POLYPECTOMY     CORONARY ANGIOPLASTY WITH STENT PLACEMENT Left 10/07/2021   Procedure: CORONARY ANGIOPLASTY WITH STENT PLACEMENT; Location: [facility]; Surgeon: Weyman Pedro, MD   DIALYSIS/PERMA CATHETER INSERTION N/A 05/13/2021   Procedure: DIALYSIS/PERMA CATHETER INSERTION;  Surgeon: Algernon Huxley, MD;  Location: [place];  Service: Cardiovascular;  Laterality: N/A;   ESOPHAGOGASTRODUODENOSCOPY  03/02/2017   Normal   HEMORRHOID SURGERY      Social History   Tobacco Use   Smoking status: Former    Passive exposure: Never   Smokeless tobacco: Never  Vaping Use   Vaping Use: Never used  Substance Use Topics   Alcohol use: No   Drug use: No     Medication list has been reviewed and updated.  Current Meds  Medication Sig   acetaminophen (TYLENOL) 500 MG tablet Take 1 tablet by mouth every 8 (eight) hours as needed.   aspirin EC 81 MG tablet Take 81 mg by mouth daily. Swallow whole.   BD PEN NEEDLE NANO U/F 32G X 4 MM MISC  Inject 1 each into the skin 3 (three) times daily.   brimonidine (ALPHAGAN) 0.2 % ophthalmic solution Place 1 drop into the left eye 2 (two) times daily.   calcium acetate (PHOSLO) 667 MG capsule Take 667 mg by mouth 3 (three) times daily.   carvedilol (COREG) 12.5 MG tablet Take 12.5 mg by mouth 2 (two) times daily.   clopidogrel (PLAVIX) 75 MG tablet Take 75 mg by mouth daily.   Continuous Blood Gluc Receiver (FREESTYLE LIBRE 2 READER) DEVI USE AS DIRECTED   Continuous Blood Gluc Sensor (FREESTYLE LIBRE 2 SENSOR) MISC USE AS DIRECTED EVERY 14 DAYS   dorzolamide-timolol (COSOPT) 22.3-6.8 MG/ML ophthalmic solution SMARTSIG:1 Drop(s) Left Eye Every 12 Hours   Epoetin Alfa (EPOGEN IJ) Epoetin Alfa (Epogen)   escitalopram (LEXAPRO) 5 MG tablet TAKE 1 TABLET (5 MG TOTAL) BY MOUTH DAILY.   ezetimibe (ZETIA) 10 MG tablet Take 10 mg by mouth daily.   glucose blood (FREESTYLE LITE) test strip 1 each by Other route five (5) times a day.   HYDROcodone-acetaminophen (NORCO) 5-325 MG tablet  Take 1-2 tablets by mouth every 6 (six) hours as needed for moderate pain or severe pain.   insulin glargine (LANTUS SOLOSTAR) 100 UNIT/ML Solostar Pen Inject 16 Units into the skin daily. morning   insulin lispro (HUMALOG) 100 UNIT/ML injection Inject 5 Units into the skin 3 (three) times daily with meals.   iron sucrose in sodium chloride 0.9 % 100 mL Iron Sucrose (Venofer)   latanoprost (XALATAN) 0.005 % ophthalmic solution Place 1 drop into the left eye at bedtime.   losartan (COZAAR) 100 MG tablet Take by mouth.   nitroGLYCERIN (NITROSTAT) 0.4 MG SL tablet Place under the tongue.   RABEprazole (ACIPHEX) 20 MG tablet Take 20 mg by mouth daily.   rosuvastatin (CRESTOR) 10 MG tablet Take 10 mg by mouth daily.   [DISCONTINUED] carvedilol (COREG) 25 MG tablet Take 25 mg by mouth 2 (two) times daily with a meal.       04/18/2022    2:04 PM 08/06/2021    2:48 PM 04/10/2021    3:41 PM 02/15/2021    2:34 PM  GAD 7 :  Generalized Anxiety Score  Nervous, Anxious, on Edge 0 0 0 0  Control/stop worrying 0 0 0 0  Worry too much - different things 0 0 0 0  Trouble relaxing 0 0 0 0  Restless 0 0 0 0  Easily annoyed or irritable 0 0 0 0  Afraid - awful might happen 0 0 0 0  Total GAD 7 Score 0 0 0 0  Anxiety Difficulty Not difficult at all Not difficult at all Not difficult at all        04/18/2022    2:04 PM 08/06/2021    2:48 PM 04/10/2021    3:40 PM  Depression screen PHQ 2/9  Decreased Interest 0 0 1  Down, Depressed, Hopeless 0 0 0  PHQ - 2 Score 0 0 1  Altered sleeping 0 0 0  Tired, decreased energy 0 0 1  Change in appetite 0 0 2  Feeling bad or failure about yourself  0 0 0  Trouble concentrating 0 0 0  Moving slowly or fidgety/restless 0 0 0  Suicidal thoughts 0 0 0  PHQ-9 Score 0 0 4  Difficult doing work/chores Not difficult at all Not difficult at all Not difficult at all    BP Readings from Last 3 Encounters:  04/18/22 (!) 142/82  03/28/22 (!) 177/74  03/10/22 (!) 193/86    Physical Exam Vitals and nursing note reviewed.  Constitutional:      General: She is not in acute distress.    Appearance: Normal appearance. She is well-developed.  HENT:     Head: Normocephalic and atraumatic.  Cardiovascular:     Rate and Rhythm: Normal rate and regular rhythm.     Arteriovenous access: Left arteriovenous access is present. Pulmonary:     Effort: Pulmonary effort is normal. No respiratory distress.     Breath sounds: No wheezing or rhonchi.  Chest:     Comments: Permacath upper chest Skin:    General: Skin is warm and dry.     Findings: No rash.  Neurological:     Mental Status: She is alert and oriented to person, place, and time.  Psychiatric:        Mood and Affect: Mood normal.        Behavior: Behavior normal.     Wt Readings from Last 3 Encounters:  04/18/22 161 lb (73 kg)  03/28/22 157 lb (  71.2 kg)  03/10/22 160 lb (72.6 kg)    BP (!) 142/82 (BP Location:  Right Arm, Cuff Size: Normal)   Pulse 81   Ht 5' 6" (1.676 m)   Wt 161 lb (73 kg)   SpO2 95%   BMI 25.99 kg/m   Assessment and Plan: 1. Essential hypertension Clinically stable exam with well controlled BP. Tolerating medications without side effects at this time. Pt to continue current regimen and low sodium diet; benefits of regular exercise as able discussed.  2. Colon cancer screening Due for colonoscopy prior to transplant. - Ambulatory referral to Gastroenterology  3. Encounter for screening mammogram for breast cancer Schedule at [facility] - MM 3D SCREEN BREAST BILATERAL  4. Intractable vomiting with nausea - promethazine (PHENERGAN) 12.5 MG tablet; Take 1 tablet (12.5 mg total) by mouth every 8 (eight) hours as needed for nausea or vomiting.  Dispense: 30 tablet; Refill: 0  5. Adrenal mass ([place]) Myolipoma unchanged as of 05/2021  6. Moderate episode of recurrent major depressive disorder (HCC) Clinically stable on current regimen with good control of symptoms, No SI or HI. Will continue current therapy with Lexapro.    Partially dictated using Editor, commissioning. Any errors are unintentional.  Halina Maidens, MD [facility]  04/18/2022

## 2022-04-18 NOTE — Progress Notes (Deleted)
Date:  04/18/2022   Name:  Kelli [name]   DOB:  02/24/1961   MRN:  [id number]   Chief Complaint: No chief complaint on file. Kelli [name] is a 61 y.o. female who presents today for her Complete Annual Exam. She feels {DESC; WELL/FAIRLY WELL/POORLY:18703}. She reports exercising ***. She reports she is sleeping {DESC; WELL/FAIRLY WELL/POORLY:18703}. Breast complaints ***.  Mammogram: 07/2020 DEXA: none Pap smear: discontinued Colonoscopy: 07/[facility] - adenomatous polyp  Health Maintenance Due  Topic Date Due   Pneumococcal Vaccine 73-7 Years old (2 - PCV) 06/29/2015   COVID-19 Vaccine (3 - Moderna series) 01/19/2020   OPHTHALMOLOGY EXAM  04/05/2021   MAMMOGRAM  08/13/2021   INFLUENZA VACCINE  03/25/2022   FOOT EXAM  04/10/2022    Immunization History  Administered Date(s) Administered   Hepb-cpg 09/04/2021, 10/10/2021, 11/05/2021, 01/02/2022, 03/29/2022   Influenza Whole 10/09/2010   Influenza,inj,Quad PF,6+ Mos 06/21/2021   Influenza-Unspecified 06/03/2017, 07/05/2018, 04/29/2020   Moderna Sars-Covid-2 Vaccination 10/24/2019, 11/24/2019   Pneumococcal Polysaccharide-23 06/28/2014   Tdap 12/28/2014   Zoster Recombinat (Shingrix) 04/19/2020, 07/13/2020    Hypertension Pertinent negatives include no chest pain, headaches, palpitations or shortness of breath.  Diabetes Pertinent negatives for hypoglycemia include no dizziness, headaches, nervousness/anxiousness or tremors. Pertinent negatives for diabetes include no chest pain, no fatigue, no polydipsia and no polyuria.  Hyperlipidemia Pertinent negatives include no chest pain or shortness of breath.  ESRD - getting HD and tolerating well.  Seeing [facility] service.  They have done a thorough review and recommend Colonoscopy and Mammogram which are both overdue.  Lab Results  Component Value Date   NA 137 03/28/2022   K 4.5 03/28/2022   CO2 27 (A) 12/11/2021   GLUCOSE 320 (H) 03/28/2022   BUN 18 03/28/2022    CREATININE 5.20 (H) 03/28/2022   CALCIUM 7.5 (A) 12/11/2021   EGFR 11 12/11/2021   GFRNONAA 11 (L) 12/06/2021   Lab Results  Component Value Date   CHOL 218 (A) 12/11/2021   HDL 109 (A) 12/11/2021   LDLCALC 84 12/11/2021   TRIG 126 12/11/2021   CHOLHDL 4.2 04/10/2021   Lab Results  Component Value Date   TSH 0.619 05/26/2021   Lab Results  Component Value Date   HGBA1C 8.3 04/04/2022   Lab Results  Component Value Date   WBC 6.7 12/11/2021   HGB 13.3 03/28/2022   HCT 39.0 03/28/2022   MCV 93.0 12/06/2021   PLT 260 12/11/2021   Lab Results  Component Value Date   ALT 14 12/11/2021   AST 20 12/11/2021   ALKPHOS 63 12/11/2021   BILITOT 0.8 12/06/2021   No results found for: "25OHVITD2", "25OHVITD3", "VD25OH"   Review of Systems  Constitutional:  Negative for chills, fatigue and fever.  HENT:  Negative for congestion, hearing loss, tinnitus, trouble swallowing and voice change.   Eyes:  Negative for visual disturbance.  Respiratory:  Negative for cough, chest tightness, shortness of breath and wheezing.   Cardiovascular:  Negative for chest pain, palpitations and leg swelling.  Gastrointestinal:  Negative for abdominal pain, constipation, diarrhea and vomiting.  Endocrine: Negative for polydipsia and polyuria.  Genitourinary:  Negative for dysuria, frequency, genital sores, vaginal bleeding and vaginal discharge.  Musculoskeletal:  Negative for arthralgias, gait problem and joint swelling.  Skin:  Negative for color change and rash.  Neurological:  Negative for dizziness, tremors, light-headedness and headaches.  Hematological:  Negative for adenopathy. Does not bruise/bleed easily.  Psychiatric/Behavioral:  Negative  for dysphoric mood and sleep disturbance. The patient is not nervous/anxious.     Patient Active Problem List   Diagnosis Date Noted   OSA (obstructive sleep apnea) 04/17/2022   Coronary artery disease involving native coronary artery of native  heart 10/07/2021   S/P drug eluting coronary stent placement 10/07/2021   ESRD on peritoneal dialysis ([place]) 08/06/2021   Peptic ulcer disease 08/06/2021   Chest pain    Intractable cyclical vomiting with nausea 03/31/2021   Refusal of blood transfusions as patient is Jehovah's Witness 02/27/2021   Dysuria 01/28/2021   Myalgia due to statin 10/12/2020   Status post hysterectomy 10/02/2020   Psychophysiological insomnia 05/30/2020   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, left eye, 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   Neovascular glaucoma of left eye, 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   Hyperlipidemia due to type 1 diabetes mellitus ([place]) 06/30/2019   Hypomagnesemia 06/30/2019   Neuropathy of left foot 03/18/2017   Arthrosis of midfoot, left 03/18/2017   Charcot foot due to diabetes mellitus ([place]) 02/09/2017   Venous insufficiency of both lower extremities 02/09/2017   Chronic idiopathic constipation 01/26/2017   Abnormal thyroid blood test 09/30/2016   Essential hypertension 08/21/2016   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   Temporomandibular joint pain 11/02/2014   Moderate episode of recurrent major depressive disorder ([place]) 11/02/2014   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[place]) 06/28/2014   Eustachian tube dysfunction 05/10/2014   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    Past Surgical History:  Procedure  Laterality Date   ABDOMINAL HYSTERECTOMY     AV FISTULA PLACEMENT Left 03/28/2022   Procedure: CREATION OF ARTERIOVENOUS (AV) FISTULA;  Surgeon: Katha Cabal, MD;  Location: [facility];  Service: Vascular;  Laterality: Left;   CAPD INSERTION N/A 05/30/2021   Procedure: CONTINUOUS AMBULATORY PERITONEAL DIALYSIS  (CAPD) CATHETER INSERTION;  Surgeon: Jules Husbands, MD;  Location: [facility];  Service: General;  Laterality: N/A;   CAPD REMOVAL N/A 12/06/2021   Procedure: REMOVAL OF CONTINUOUS AMBULATORY PERITONEAL DIALYSIS  (CAPD) CATHETER;  Surgeon: Jules Husbands, MD;  Location: [facility];  Service: General;  Laterality: N/A;   COLONOSCOPY W/ POLYPECTOMY     CORONARY ANGIOPLASTY WITH STENT PLACEMENT Left 10/07/2021   Procedure: CORONARY ANGIOPLASTY WITH STENT PLACEMENT; Location: [facility]; Surgeon: Weyman Pedro, MD   DIALYSIS/PERMA CATHETER INSERTION N/A 05/13/2021   Procedure: DIALYSIS/PERMA CATHETER INSERTION;  Surgeon: Algernon Huxley, MD;  Location: [place] [facility];  Service: Cardiovascular;  Laterality: N/A;   ESOPHAGOGASTRODUODENOSCOPY  03/02/2017   Normal   HEMORRHOID SURGERY      Social History   Tobacco Use   Smoking status: Former    Passive exposure: Never   Smokeless tobacco: Never  Vaping Use   Vaping Use: Never used  Substance Use Topics   Alcohol use: No   Drug use: No     Medication list  has been reviewed and updated.  No outpatient medications have been marked as taking for the 04/18/22 encounter (Appointment) with Glean Hess, MD.       08/06/2021    2:48 PM 04/10/2021    3:41 PM 02/15/2021    2:34 PM 01/28/2021    1:27 PM  GAD 7 : Generalized Anxiety Score  Nervous, Anxious, on Edge 0 0 0 3  Control/stop worrying 0 0 0 1  Worry too much - different things 0 0 0 0  Trouble relaxing 0 0 0 3  Restless 0 0 0 0  Easily annoyed or irritable 0 0 0 0  Afraid - awful might happen 0 0 0 0  Total GAD 7 Score 0 0 0 7  Anxiety Difficulty Not difficult at all  Not difficult at all  Very difficult       08/06/2021    2:48 PM 04/10/2021    3:40 PM 02/15/2021    2:33 PM  Depression screen PHQ 2/9  Decreased Interest 0 1 0  Down, Depressed, Hopeless 0 0 0  PHQ - 2 Score 0 1 0  Altered sleeping 0 0 0  Tired, decreased energy 0 1 0  Change in appetite 0 2 0  Feeling bad or failure about yourself  0 0 0  Trouble concentrating 0 0 0  Moving slowly or fidgety/restless 0 0 0  Suicidal thoughts 0 0 0  PHQ-9 Score 0 4 0  Difficult doing work/chores Not difficult at all Not difficult at all Not difficult at all    BP Readings from Last 3 Encounters:  03/28/22 (!) 177/74  03/10/22 (!) 193/86  02/26/22 (!) 232/101    Physical Exam Vitals and nursing note reviewed.  Constitutional:      General: She is not in acute distress.    Appearance: She is well-developed.  HENT:     Head: Normocephalic and atraumatic.     Right Ear: Tympanic membrane and ear canal normal.     Left Ear: Tympanic membrane and ear canal normal.     Nose:     Right Sinus: No maxillary sinus tenderness.     Left Sinus: No maxillary sinus tenderness.  Eyes:     General: No scleral icterus.       Right eye: No discharge.        Left eye: No discharge.     Conjunctiva/sclera: Conjunctivae normal.  Neck:     Thyroid: No thyromegaly.     Vascular: No carotid bruit.  Cardiovascular:     Rate and Rhythm: Normal rate and regular rhythm.     Pulses: Normal pulses.     Heart sounds: Normal heart sounds.  Pulmonary:     Effort: Pulmonary effort is normal. No respiratory distress.     Breath sounds: No wheezing.  Chest:  Breasts:    Right: No mass, nipple discharge, skin change or tenderness.     Left: No mass, nipple discharge, skin change or tenderness.  Abdominal:     General: Bowel sounds are normal.     Palpations: Abdomen is soft.     Tenderness: There is no abdominal tenderness.  Musculoskeletal:     Cervical back: Normal range of motion. No erythema.     Right  lower leg: No edema.     Left lower leg: No edema.  Lymphadenopathy:     Cervical: No cervical adenopathy.  Skin:    General: Skin is warm and dry.  Findings: No rash.  Neurological:     Mental Status: She is alert and oriented to person, place, and time.     Cranial Nerves: No cranial nerve deficit.     Sensory: No sensory deficit.     Deep Tendon Reflexes: Reflexes are normal and symmetric.  Psychiatric:        Attention and Perception: Attention normal.        Mood and Affect: Mood normal.     Wt Readings from Last 3 Encounters:  03/28/22 157 lb (71.2 kg)  03/10/22 160 lb (72.6 kg)  02/26/22 166 lb (75.3 kg)    There were no vitals taken for this visit.  Assessment and Plan:

## 2022-04-19 DIAGNOSIS — N186 End stage renal disease: Secondary | ICD-10-CM | POA: Diagnosis not present

## 2022-04-19 DIAGNOSIS — Z992 Dependence on renal dialysis: Secondary | ICD-10-CM | POA: Diagnosis not present

## 2022-04-19 DIAGNOSIS — N2581 Secondary hyperparathyroidism of renal origin: Secondary | ICD-10-CM | POA: Diagnosis not present

## 2022-04-19 DIAGNOSIS — R110 Nausea: Secondary | ICD-10-CM | POA: Diagnosis not present

## 2022-04-22 ENCOUNTER — Telehealth (INDEPENDENT_AMBULATORY_CARE_PROVIDER_SITE_OTHER): Payer: Self-pay

## 2022-04-22 DIAGNOSIS — R110 Nausea: Secondary | ICD-10-CM | POA: Diagnosis not present

## 2022-04-22 DIAGNOSIS — N186 End stage renal disease: Secondary | ICD-10-CM | POA: Diagnosis not present

## 2022-04-22 DIAGNOSIS — Z992 Dependence on renal dialysis: Secondary | ICD-10-CM | POA: Diagnosis not present

## 2022-04-22 DIAGNOSIS — N2581 Secondary hyperparathyroidism of renal origin: Secondary | ICD-10-CM | POA: Diagnosis not present

## 2022-04-22 NOTE — Telephone Encounter (Signed)
Patient called and left a message asking about what to do with her permcath. I attempted to contact the patient and a message was left for a return call.

## 2022-04-24 ENCOUNTER — Other Ambulatory Visit (INDEPENDENT_AMBULATORY_CARE_PROVIDER_SITE_OTHER): Payer: Self-pay | Admitting: Vascular Surgery

## 2022-04-24 DIAGNOSIS — Z992 Dependence on renal dialysis: Secondary | ICD-10-CM | POA: Diagnosis not present

## 2022-04-24 DIAGNOSIS — H4089 Other specified glaucoma: Secondary | ICD-10-CM | POA: Diagnosis not present

## 2022-04-24 DIAGNOSIS — R110 Nausea: Secondary | ICD-10-CM | POA: Diagnosis not present

## 2022-04-24 DIAGNOSIS — E113513 Type 2 diabetes mellitus with proliferative diabetic retinopathy with macular edema, bilateral: Secondary | ICD-10-CM | POA: Diagnosis not present

## 2022-04-24 DIAGNOSIS — Z9889 Other specified postprocedural states: Secondary | ICD-10-CM

## 2022-04-24 DIAGNOSIS — N2581 Secondary hyperparathyroidism of renal origin: Secondary | ICD-10-CM | POA: Diagnosis not present

## 2022-04-24 DIAGNOSIS — N186 End stage renal disease: Secondary | ICD-10-CM | POA: Diagnosis not present

## 2022-04-25 ENCOUNTER — Encounter (INDEPENDENT_AMBULATORY_CARE_PROVIDER_SITE_OTHER): Payer: BC Managed Care – PPO

## 2022-04-25 ENCOUNTER — Ambulatory Visit (INDEPENDENT_AMBULATORY_CARE_PROVIDER_SITE_OTHER): Payer: Medicare Other | Admitting: Nurse Practitioner

## 2022-04-26 DIAGNOSIS — N186 End stage renal disease: Secondary | ICD-10-CM | POA: Diagnosis not present

## 2022-04-26 DIAGNOSIS — Z992 Dependence on renal dialysis: Secondary | ICD-10-CM | POA: Diagnosis not present

## 2022-04-26 DIAGNOSIS — N2581 Secondary hyperparathyroidism of renal origin: Secondary | ICD-10-CM | POA: Diagnosis not present

## 2022-05-05 ENCOUNTER — Telehealth: Payer: Self-pay | Admitting: Internal Medicine

## 2022-05-05 NOTE — Telephone Encounter (Signed)
Called patient and she said that patient just needs forms completed for FMLA. Told pt to have husband bring forms to the office and drop them off. She verbalized understanding.  - Ben Habermann

## 2022-05-05 NOTE — Telephone Encounter (Signed)
Copied from [place] [id number]. Topic: General - Inquiry >> May 05, 2022  9:49 AM Leilani Able wrote: Reason for CRM: Called office, Pt husband is due to wife's condition, requesting more FMLA time for himself Shandell Giovanni at [phone] or will lose job. States in the past that it is ok just to e-mail Dr B the paperwork and she will complete? FU with Jenny Reichmann re.

## 2022-05-06 DIAGNOSIS — N2581 Secondary hyperparathyroidism of renal origin: Secondary | ICD-10-CM | POA: Diagnosis not present

## 2022-05-06 DIAGNOSIS — R110 Nausea: Secondary | ICD-10-CM | POA: Diagnosis not present

## 2022-05-06 DIAGNOSIS — Z992 Dependence on renal dialysis: Secondary | ICD-10-CM | POA: Diagnosis not present

## 2022-05-06 DIAGNOSIS — N186 End stage renal disease: Secondary | ICD-10-CM | POA: Diagnosis not present

## 2022-05-06 DIAGNOSIS — E113513 Type 2 diabetes mellitus with proliferative diabetic retinopathy with macular edema, bilateral: Secondary | ICD-10-CM | POA: Diagnosis not present

## 2022-05-06 DIAGNOSIS — E113511 Type 2 diabetes mellitus with proliferative diabetic retinopathy with macular edema, right eye: Secondary | ICD-10-CM | POA: Diagnosis not present

## 2022-05-07 DIAGNOSIS — H4052X4 Glaucoma secondary to other eye disorders, left eye, indeterminate stage: Secondary | ICD-10-CM | POA: Diagnosis not present

## 2022-05-08 DIAGNOSIS — Z992 Dependence on renal dialysis: Secondary | ICD-10-CM | POA: Diagnosis not present

## 2022-05-08 DIAGNOSIS — R110 Nausea: Secondary | ICD-10-CM | POA: Diagnosis not present

## 2022-05-08 DIAGNOSIS — N186 End stage renal disease: Secondary | ICD-10-CM | POA: Diagnosis not present

## 2022-05-08 DIAGNOSIS — N2581 Secondary hyperparathyroidism of renal origin: Secondary | ICD-10-CM | POA: Diagnosis not present

## 2022-05-09 ENCOUNTER — Ambulatory Visit (INDEPENDENT_AMBULATORY_CARE_PROVIDER_SITE_OTHER): Payer: BC Managed Care – PPO

## 2022-05-09 DIAGNOSIS — Z9889 Other specified postprocedural states: Secondary | ICD-10-CM

## 2022-05-09 DIAGNOSIS — N186 End stage renal disease: Secondary | ICD-10-CM

## 2022-05-10 DIAGNOSIS — N186 End stage renal disease: Secondary | ICD-10-CM | POA: Diagnosis not present

## 2022-05-10 DIAGNOSIS — N2581 Secondary hyperparathyroidism of renal origin: Secondary | ICD-10-CM | POA: Diagnosis not present

## 2022-05-10 DIAGNOSIS — R110 Nausea: Secondary | ICD-10-CM | POA: Diagnosis not present

## 2022-05-10 DIAGNOSIS — Z992 Dependence on renal dialysis: Secondary | ICD-10-CM | POA: Diagnosis not present

## 2022-05-12 ENCOUNTER — Ambulatory Visit
Admission: RE | Admit: 2022-05-12 | Discharge: 2022-05-12 | Disposition: A | Discharge status: Home / Self Care | Payer: BC Managed Care – PPO | Source: Ambulatory Visit | Attending: Internal Medicine | Admitting: Internal Medicine

## 2022-05-12 DIAGNOSIS — H401112 Primary open-angle glaucoma, right eye, moderate stage: Secondary | ICD-10-CM | POA: Diagnosis not present

## 2022-05-12 DIAGNOSIS — H524 Presbyopia: Secondary | ICD-10-CM | POA: Diagnosis not present

## 2022-05-12 DIAGNOSIS — Z1231 Encounter for screening mammogram for malignant neoplasm of breast: Secondary | ICD-10-CM | POA: Insufficient documentation

## 2022-05-12 LAB — HM DIABETES EYE EXAM

## 2022-05-13 DIAGNOSIS — I10 Essential (primary) hypertension: Secondary | ICD-10-CM | POA: Diagnosis not present

## 2022-05-13 DIAGNOSIS — Z992 Dependence on renal dialysis: Secondary | ICD-10-CM | POA: Diagnosis not present

## 2022-05-13 DIAGNOSIS — R110 Nausea: Secondary | ICD-10-CM | POA: Diagnosis not present

## 2022-05-13 DIAGNOSIS — N186 End stage renal disease: Secondary | ICD-10-CM | POA: Diagnosis not present

## 2022-05-13 DIAGNOSIS — N2581 Secondary hyperparathyroidism of renal origin: Secondary | ICD-10-CM | POA: Diagnosis not present

## 2022-05-14 ENCOUNTER — Other Ambulatory Visit: Payer: Self-pay | Admitting: Internal Medicine

## 2022-05-14 DIAGNOSIS — F331 Major depressive disorder, recurrent, moderate: Secondary | ICD-10-CM

## 2022-05-15 NOTE — Telephone Encounter (Signed)
Requested Prescriptions  Pending Prescriptions Disp Refills  . escitalopram (LEXAPRO) 5 MG tablet [Pharmacy Med Name: ESCITALOPRAM 5 MG TABLET] 90 tablet 1    Sig: TAKE 1 TABLET (5 MG TOTAL) BY MOUTH DAILY.     Psychiatry:  Antidepressants - SSRI Passed - 05/14/2022  2:34 PM      Passed - Completed PHQ-2 or PHQ-9 in the last 360 days      Passed - Valid encounter within last 6 months    Recent Outpatient Visits          3 weeks ago Essential hypertension   [place] [facility], Jesse Sans, MD   9 months ago Sleep disorder breathing   [place] [facility] at [facility], Jesse Sans, MD   1 year ago Annual physical exam   [place] [facility] at [facility], Jesse Sans, MD   1 year ago Acute cystitis without hematuria   [place] [facility] at [facility], Jesse Sans, MD   1 year ago [place] [facility] at [facility], Earley Abide, MD      Future Appointments            In 1 month [facility] Melia, Jesse Sans, MD [place] [facility] at [facility], [facility]

## 2022-05-16 DIAGNOSIS — N2581 Secondary hyperparathyroidism of renal origin: Secondary | ICD-10-CM | POA: Diagnosis not present

## 2022-05-16 DIAGNOSIS — Z992 Dependence on renal dialysis: Secondary | ICD-10-CM | POA: Diagnosis not present

## 2022-05-16 DIAGNOSIS — I10 Essential (primary) hypertension: Secondary | ICD-10-CM | POA: Diagnosis not present

## 2022-05-16 DIAGNOSIS — R110 Nausea: Secondary | ICD-10-CM | POA: Diagnosis not present

## 2022-05-16 DIAGNOSIS — N186 End stage renal disease: Secondary | ICD-10-CM | POA: Diagnosis not present

## 2022-05-17 DIAGNOSIS — Z992 Dependence on renal dialysis: Secondary | ICD-10-CM | POA: Diagnosis not present

## 2022-05-17 DIAGNOSIS — N186 End stage renal disease: Secondary | ICD-10-CM | POA: Diagnosis not present

## 2022-05-17 DIAGNOSIS — N2581 Secondary hyperparathyroidism of renal origin: Secondary | ICD-10-CM | POA: Diagnosis not present

## 2022-05-17 DIAGNOSIS — R110 Nausea: Secondary | ICD-10-CM | POA: Diagnosis not present

## 2022-05-17 DIAGNOSIS — I10 Essential (primary) hypertension: Secondary | ICD-10-CM | POA: Diagnosis not present

## 2022-05-20 DIAGNOSIS — N2581 Secondary hyperparathyroidism of renal origin: Secondary | ICD-10-CM | POA: Diagnosis not present

## 2022-05-20 DIAGNOSIS — N186 End stage renal disease: Secondary | ICD-10-CM | POA: Diagnosis not present

## 2022-05-20 DIAGNOSIS — Z992 Dependence on renal dialysis: Secondary | ICD-10-CM | POA: Diagnosis not present

## 2022-05-20 DIAGNOSIS — I10 Essential (primary) hypertension: Secondary | ICD-10-CM | POA: Diagnosis not present

## 2022-05-21 DIAGNOSIS — N186 End stage renal disease: Secondary | ICD-10-CM | POA: Diagnosis not present

## 2022-05-21 DIAGNOSIS — N2581 Secondary hyperparathyroidism of renal origin: Secondary | ICD-10-CM | POA: Diagnosis not present

## 2022-05-21 DIAGNOSIS — I10 Essential (primary) hypertension: Secondary | ICD-10-CM | POA: Diagnosis not present

## 2022-05-21 DIAGNOSIS — Z992 Dependence on renal dialysis: Secondary | ICD-10-CM | POA: Diagnosis not present

## 2022-05-22 ENCOUNTER — Ambulatory Visit (INDEPENDENT_AMBULATORY_CARE_PROVIDER_SITE_OTHER): Payer: BC Managed Care – PPO

## 2022-05-22 ENCOUNTER — Ambulatory Visit
Admission: EM | Admit: 2022-05-22 | Discharge: 2022-05-22 | Disposition: A | Discharge status: Home / Self Care | Payer: BC Managed Care – PPO | Attending: Emergency Medicine | Admitting: Emergency Medicine

## 2022-05-22 DIAGNOSIS — Z992 Dependence on renal dialysis: Secondary | ICD-10-CM | POA: Diagnosis not present

## 2022-05-22 DIAGNOSIS — Z794 Long term (current) use of insulin: Secondary | ICD-10-CM | POA: Insufficient documentation

## 2022-05-22 DIAGNOSIS — E1022 Type 1 diabetes mellitus with diabetic chronic kidney disease: Secondary | ICD-10-CM | POA: Insufficient documentation

## 2022-05-22 DIAGNOSIS — Z20822 Contact with and (suspected) exposure to covid-19: Secondary | ICD-10-CM | POA: Insufficient documentation

## 2022-05-22 DIAGNOSIS — R051 Acute cough: Secondary | ICD-10-CM | POA: Insufficient documentation

## 2022-05-22 DIAGNOSIS — I120 Hypertensive chronic kidney disease with stage 5 chronic kidney disease or end stage renal disease: Secondary | ICD-10-CM | POA: Diagnosis not present

## 2022-05-22 DIAGNOSIS — N186 End stage renal disease: Secondary | ICD-10-CM | POA: Insufficient documentation

## 2022-05-22 DIAGNOSIS — Z959 Presence of cardiac and vascular implant and graft, unspecified: Secondary | ICD-10-CM | POA: Diagnosis not present

## 2022-05-22 DIAGNOSIS — N2581 Secondary hyperparathyroidism of renal origin: Secondary | ICD-10-CM | POA: Diagnosis not present

## 2022-05-22 DIAGNOSIS — R059 Cough, unspecified: Secondary | ICD-10-CM | POA: Diagnosis not present

## 2022-05-22 DIAGNOSIS — R531 Weakness: Secondary | ICD-10-CM | POA: Diagnosis not present

## 2022-05-22 DIAGNOSIS — I10 Essential (primary) hypertension: Secondary | ICD-10-CM | POA: Diagnosis not present

## 2022-05-22 LAB — SARS CORONAVIRUS 2 BY RT PCR: SARS Coronavirus 2 by RT PCR: NEGATIVE

## 2022-05-22 MED ORDER — BENZONATATE 100 MG PO CAPS: 200.0000 mg | ORAL_CAPSULE | Freq: Three times a day (TID) | ORAL | 0 refills | Status: DC

## 2022-05-22 MED ORDER — PROMETHAZINE-DM 6.25-15 MG/5ML PO SYRP: 5.0000 mL | ORAL_SOLUTION | Freq: Four times a day (QID) | ORAL | 0 refills | Status: DC | PRN

## 2022-05-22 NOTE — ED Provider Notes (Signed)
[facility]    CSN: [id number] Arrival date & time: 05/22/22  1226      History   Chief Complaint Chief Complaint  Patient presents with   Cough   Weakness    HPI Kelli [name] is a 61 y.o. female.   HPI  61 year old female here for evaluation of respiratory complaints.  Patient reports that for last 3 days she has been experiencing a nonproductive cough, runny nose, shortness breath, and wheezing.  No fever, ear pain, sore throat, vomiting, or diarrhea.  She does have a significant past medical history to include hypertension, type 1 diabetes, end-stage renal disease on hemodialysis.  She has a permacath in her right chest.  Patient is also reporting feeling weak but states that she always feels weak and she attributes that to being on dialysis.  She is here requesting a test for COVID.  Past Medical History:  Diagnosis Date   Anemia of chronic renal failure    a.) on epoetin injections per nephrology   Anginal pain ([place])    Blood transfusion declined because patient is Jehovah's Witness    Cancer ([place])    Charcot foot due to diabetes mellitus ([place])    Chorioretinal inflammation of both eyes    Coronary artery disease 10/07/2021   a.) LHC/PCI 10/07/2021: diffusely diseased RCA --> 3.0 x 48 mm (proximal) and 2.75 x 38 mm (distal) DES x 2 (unknown type) placed   Depression    Diabetes mellitus type 1.5 (HCC)    Diastolic dysfunction 93/79/0240   a.) TTE 09/26/2021: EF 40%, sep/inf/post HK, mod LVH, GLS -10.1%, mild LAE, triv MR, G2DD; b.) TTE 01/06/2022: EF 48%, LVH, GLS -10.7%, moderate LAE, trivial TR, mild MR, G2DD   ESRD (end stage renal disease) on dialysis ([place])    a.) T-Th-Sat   GERD (gastroesophageal reflux disease)    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, left eye, severe stage    Hyperlipidemia    Hypertension    Long term current use of antithrombotics/antiplatelets    a.) daily DAPT therapy (ASA + clopidogrel)   Migraine    Neuropathy     PUD (peptic ulcer disease)    Staph infection     Patient Active Problem List   Diagnosis Date Noted   Adrenal mass ([place]) 04/18/2022   OSA (obstructive sleep apnea) 04/17/2022   Coronary artery disease involving native coronary artery of native heart 10/07/2021   S/P drug eluting coronary stent placement 10/07/2021   ESRD on peritoneal dialysis ([place]) 08/06/2021   Peptic ulcer disease 08/06/2021   Chest pain    Intractable cyclical vomiting with nausea 03/31/2021   Refusal of blood transfusions as patient is Jehovah's Witness 02/27/2021   Dysuria 01/28/2021   Myalgia due to statin 10/12/2020   Status post hysterectomy 10/02/2020   Psychophysiological insomnia 05/30/2020   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, left eye, 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   Neovascular glaucoma of left eye, 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   Hyperlipidemia due to type 1 diabetes mellitus ([place]) 06/30/2019   Hypomagnesemia 06/30/2019   Neuropathy of left foot 03/18/2017   Arthrosis of midfoot, left 03/18/2017   Charcot foot due to diabetes mellitus ([place]) 02/09/2017   Venous insufficiency of both lower extremities 02/09/2017   Chronic idiopathic constipation 01/26/2017   Abnormal thyroid blood test 09/30/2016   Essential hypertension 08/21/2016   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   Temporomandibular joint pain 11/02/2014   Moderate episode of recurrent major depressive disorder ([place]) 11/02/2014  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[place]) 06/28/2014   Eustachian tube dysfunction 05/10/2014   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   Vitamin D deficiency 12/30/2012   Corns and callosity 07/09/2012   Hammer toe, acquired 07/09/2012    Past Surgical History:  Procedure Laterality Date   ABDOMINAL HYSTERECTOMY     AV FISTULA PLACEMENT Left 03/28/2022   Procedure: CREATION OF ARTERIOVENOUS (AV) FISTULA;  Surgeon: Katha Cabal, MD;  Location: [facility];  Service: Vascular;   Laterality: Left;   CAPD INSERTION N/A 05/30/2021   Procedure: CONTINUOUS AMBULATORY PERITONEAL DIALYSIS  (CAPD) CATHETER INSERTION;  Surgeon: Jules Husbands, MD;  Location: [facility];  Service: General;  Laterality: N/A;   CAPD REMOVAL N/A 12/06/2021   Procedure: REMOVAL OF CONTINUOUS AMBULATORY PERITONEAL DIALYSIS  (CAPD) CATHETER;  Surgeon: Jules Husbands, MD;  Location: [facility];  Service: General;  Laterality: N/A;   COLONOSCOPY W/ POLYPECTOMY     CORONARY ANGIOPLASTY WITH STENT PLACEMENT Left 10/07/2021   Procedure: CORONARY ANGIOPLASTY WITH STENT PLACEMENT; Location: [facility]; Surgeon: Weyman Pedro, MD   DIALYSIS/PERMA CATHETER INSERTION N/A 05/13/2021   Procedure: DIALYSIS/PERMA CATHETER INSERTION;  Surgeon: Algernon Huxley, MD;  Location: [facility];  Service: Cardiovascular;  Laterality: N/A;   ESOPHAGOGASTRODUODENOSCOPY  03/02/2017   Normal   HEMORRHOID SURGERY      OB History   No obstetric history on file.      Home Medications    Prior to Admission medications   Medication Sig Start Date End Date Taking? Authorizing Provider  benzonatate (TESSALON) 100 MG capsule Take 2 capsules (200 mg total) by mouth every 8 (eight) hours. 05/22/22  Yes Margarette Canada, NP  promethazine-dextromethorphan (PROMETHAZINE-DM) 6.25-15 MG/5ML syrup Take 5 mLs by mouth 4 (four) times daily as needed. 05/22/22  Yes Margarette Canada, NP  acetaminophen (TYLENOL) 500 MG tablet Take 1 tablet by mouth every 8 (eight) hours as needed. 10/22/21 10/21/22  [provider]  aspirin EC 81 MG tablet Take 81 mg by mouth daily. Swallow whole.    [provider]  BD PEN NEEDLE NANO U/F 32G X 4 MM MISC Inject 1 each into the skin 3 (three) times daily. 09/27/20   [provider]  brimonidine (ALPHAGAN) 0.2 % ophthalmic solution Place 1 drop into the left eye 2 (two) times daily. 11/03/21   [provider]  calcium acetate (PHOSLO) 667 MG capsule Take 667 mg by mouth 3 (three) times  daily. 01/23/22   [provider]  carvedilol (COREG) 12.5 MG tablet Take 12.5 mg by mouth 2 (two) times daily. 03/12/22   [provider]  clopidogrel (PLAVIX) 75 MG tablet Take 75 mg by mouth daily. 10/07/21   [provider]  Continuous Blood Gluc Receiver (FREESTYLE LIBRE 2 READER) DEVI USE AS DIRECTED 02/06/22   Glean Hess, MD  Continuous Blood Gluc Sensor (FREESTYLE LIBRE 2 SENSOR) MISC USE AS DIRECTED EVERY 14 DAYS 03/03/22   Glean Hess, MD  dorzolamide-timolol (COSOPT) 22.3-6.8 MG/ML ophthalmic solution SMARTSIG:1 Drop(s) Left Eye Every 12 Hours 10/16/21   [provider]  Epoetin Alfa (EPOGEN IJ) Epoetin Alfa (Epogen) 10/03/21 10/02/22  [provider]  escitalopram (LEXAPRO) 5 MG tablet TAKE 1 TABLET (5 MG TOTAL) BY MOUTH DAILY. 05/15/22   Glean Hess, MD  ezetimibe (ZETIA) 10 MG tablet Take 10 mg by mouth daily. 03/15/22   [provider]  glucose blood (FREESTYLE LITE) test strip 1 each by Other route  five (5) times a day.    [provider]  HYDROcodone-acetaminophen (NORCO) 5-325 MG tablet Take 1-2 tablets by mouth every 6 (six) hours as needed for moderate pain or severe pain. 03/28/22   Schnier, Dolores Lory, MD  insulin glargine (LANTUS SOLOSTAR) 100 UNIT/ML Solostar Pen Inject 16 Units into the skin daily. morning    [provider]  insulin lispro (HUMALOG) 100 UNIT/ML injection Inject 5 Units into the skin 3 (three) times daily with meals.    [provider]  latanoprost (XALATAN) 0.005 % ophthalmic solution Place 1 drop into the left eye at bedtime. 10/16/21   [provider]  losartan (COZAAR) 100 MG tablet Take by mouth. 12/24/21   [provider]  nitroGLYCERIN (NITROSTAT) 0.4 MG SL tablet Place under the tongue. 10/07/21   [provider]  promethazine (PHENERGAN) 12.5 MG tablet Take 1 tablet (12.5 mg total) by mouth every 8 (eight) hours as needed for nausea or vomiting.  04/18/22   Glean Hess, MD  RABEprazole (ACIPHEX) 20 MG tablet Take 20 mg by mouth daily. 01/08/22   [provider]  rosuvastatin (CRESTOR) 10 MG tablet Take 10 mg by mouth daily. 12/11/21   [provider]    Family History Family History  Problem Relation Age of Onset   Heart failure Mother    Diabetes Mother    Breast cancer Neg Hx     Social History Social History   Tobacco Use   Smoking status: Former    Passive exposure: Never   Smokeless tobacco: Never  Vaping Use   Vaping Use: Never used  Substance Use Topics   Alcohol use: No   Drug use: No     Allergies   Statins, Albumin human, Gabapentin, Hydrochlorothiazide, Lisinopril, Lyrica [pregabalin], Metformin and related, and Penicillins   Review of Systems Review of Systems  Constitutional:  Positive for fatigue. Negative for fever.  HENT:  Positive for congestion and rhinorrhea. Negative for ear pain and sore throat.   Respiratory:  Positive for cough, shortness of breath and wheezing.   Gastrointestinal:  Negative for diarrhea, nausea and vomiting.  Hematological: Negative.   Psychiatric/Behavioral: Negative.       Physical Exam Triage Vital Signs ED Triage Vitals  Enc Vitals Group     BP 05/22/22 1301 (!) 160/63     Pulse Rate 05/22/22 1301 78     Resp --      Temp 05/22/22 1301 98.5 F (36.9 C)     Temp Source 05/22/22 1301 Oral     SpO2 05/22/22 1301 94 %     Weight 05/22/22 1300 150 lb (68 kg)     Height 05/22/22 1300 '5\' 6"'$  (1.676 m)     Head Circumference --      Peak Flow --      Pain Score 05/22/22 1300 0     Pain Loc --      Pain Edu? --      Excl. in [place]? --    No data found.  Updated Vital Signs BP (!) 160/63 (BP Location: Right Arm)   Pulse 78   Temp 98.5 F (36.9 C) (Oral)   Ht '5\' 6"'$  (1.676 m)   Wt 150 lb (68 kg)   SpO2 94%   BMI 24.21 kg/m   Visual Acuity Right Eye Distance:   Left Eye Distance:   Bilateral Distance:    Right Eye Near:   Left  Eye Near:    Bilateral  Near:     Physical Exam Vitals and nursing note reviewed.  Constitutional:      Appearance: Normal appearance. She is ill-appearing.  HENT:     Head: Normocephalic and atraumatic.     Right Ear: Tympanic membrane, ear canal and external ear normal. There is no impacted cerumen.     Left Ear: Tympanic membrane, ear canal and external ear normal. There is no impacted cerumen.     Nose: Congestion and rhinorrhea present.     Mouth/Throat:     Mouth: Mucous membranes are moist.     Pharynx: Oropharynx is clear. No posterior oropharyngeal erythema.  Cardiovascular:     Rate and Rhythm: Normal rate and regular rhythm.     Pulses: Normal pulses.     Heart sounds: Normal heart sounds. No murmur heard.    No friction rub. No gallop.  Pulmonary:     Effort: Pulmonary effort is normal.     Breath sounds: Normal breath sounds. No wheezing, rhonchi or rales.  Musculoskeletal:     Cervical back: Normal range of motion and neck supple.  Lymphadenopathy:     Cervical: No cervical adenopathy.  Skin:    General: Skin is warm and dry.     Capillary Refill: Capillary refill takes less than 2 seconds.     Findings: No erythema or rash.  Neurological:     General: No focal deficit present.     Mental Status: She is alert and oriented to person, place, and time.  Psychiatric:        Mood and Affect: Mood normal.        Behavior: Behavior normal.        Thought Content: Thought content normal.        Judgment: Judgment normal.      UC Treatments / Results  Labs (all labs ordered are listed, but only abnormal results are displayed) Labs Reviewed  SARS CORONAVIRUS 2 BY RT PCR    EKG   Radiology DG Chest 2 View  Result Date: 05/22/2022 CLINICAL DATA:  Cough for 3 days EXAM: CHEST - 2 VIEW COMPARISON:  05/26/2021 FINDINGS: Dual lumen right-sided central venous catheter in satisfactory position. No focal consolidation. No pleural effusion or pneumothorax. Heart and  mediastinal contours are unremarkable. No acute osseous abnormality. IMPRESSION: No active cardiopulmonary disease. Electronically Signed   By: Kathreen Devoid M.D.   On: 05/22/2022 13:47    Procedures Procedures (including critical care time)  Medications Ordered in UC Medications - No data to display  Initial Impression / Assessment and Plan / UC Course  I have reviewed the triage vital signs and the nursing notes.  Pertinent labs & imaging results that were available during my care of the patient were reviewed by me and considered in my medical decision making (see chart for details).   Patient is a pleasant, though mildly ill-appearing, 61 year old female here for evaluation of a lingering nonproductive cough, runny nose, shortness of breath, and wheezing that been going on for the past 3 days.  She also endorses feeling weak but states that she always feels weak and she attributes that to being on dialysis.  On exam patient does have erythema and edema in her nasal passages with scant clear rhinorrhea.  Oropharyngeal exam is benign.  No cervical lymphadenopathy on exam.  Cardiopulmonary exam reveals decreased lung sounds in all lung fields.  Patient exam does show signs of an upper respiratory infection and I will order a COVID PCR.  I am concerned about her fatigue and decreased lung sounds as well as her lingering cough.  I will obtain a chest x-ray to rule out the presence of pneumonia.  COVID PCR is negative.  Chest x-ray interpretation by radiology states no active cardiopulmonary disease.  There is a dual-lumen right-sided central venous catheter present.  No pleural effusion or pneumothorax.  I suspect the patient has a viral respiratory infection.  I will treat her symptoms with Tessalon Perles and Promethazine DM cough syrup as neither 1 needs to be renally dosed.   Final Clinical Impressions(s) / UC Diagnoses   Final diagnoses:  Acute cough     Discharge Instructions       Your test for COVID today was negative and your chest x-ray did not show any signs of pneumonia or bronchitis.  I believe that your cough is being caused by respiratory virus.  Use the Tessalon Perles every 8 hours as needed for cough.  Take them with a small sip of water.  You may experience some numbness to the base of your tongue or metallic taste in mouth, this is normal.  You can also use the Promethazine DM cough syrup every 6 hours as needed for cough.  This will make you drowsy.  You may want to save it for bedtime.  If your symptoms continue, they worsen, please return for reevaluation or see your primary care provider.     ED Prescriptions     Medication Sig Dispense Auth. Provider   benzonatate (TESSALON) 100 MG capsule Take 2 capsules (200 mg total) by mouth every 8 (eight) hours. 21 capsule Margarette Canada, NP   promethazine-dextromethorphan (PROMETHAZINE-DM) 6.25-15 MG/5ML syrup Take 5 mLs by mouth 4 (four) times daily as needed. 118 mL Margarette Canada, NP      PDMP not reviewed this encounter.   Margarette Canada, NP 05/22/22 [phone]

## 2022-05-22 NOTE — ED Triage Notes (Signed)
Pt c/o cough, weakness x3days. Pt states she is a dialysis patient and she was sent here to be tested for covid.

## 2022-05-22 NOTE — Discharge Instructions (Addendum)
Your test for COVID today was negative and your chest x-ray did not show any signs of pneumonia or bronchitis.  I believe that your cough is being caused by respiratory virus.  Use the Tessalon Perles every 8 hours as needed for cough.  Take them with a small sip of water.  You may experience some numbness to the base of your tongue or metallic taste in mouth, this is normal.  You can also use the Promethazine DM cough syrup every 6 hours as needed for cough.  This will make you drowsy.  You may want to save it for bedtime.  If your symptoms continue, they worsen, please return for reevaluation or see your primary care provider.

## 2022-05-24 DIAGNOSIS — N2581 Secondary hyperparathyroidism of renal origin: Secondary | ICD-10-CM | POA: Diagnosis not present

## 2022-05-24 DIAGNOSIS — N186 End stage renal disease: Secondary | ICD-10-CM | POA: Diagnosis not present

## 2022-05-24 DIAGNOSIS — Z992 Dependence on renal dialysis: Secondary | ICD-10-CM | POA: Diagnosis not present

## 2022-05-24 DIAGNOSIS — I10 Essential (primary) hypertension: Secondary | ICD-10-CM | POA: Diagnosis not present

## 2022-05-27 ENCOUNTER — Telehealth (INDEPENDENT_AMBULATORY_CARE_PROVIDER_SITE_OTHER): Payer: Self-pay

## 2022-05-27 ENCOUNTER — Telehealth: Payer: Self-pay

## 2022-05-27 DIAGNOSIS — Z23 Encounter for immunization: Secondary | ICD-10-CM | POA: Diagnosis not present

## 2022-05-27 DIAGNOSIS — N2581 Secondary hyperparathyroidism of renal origin: Secondary | ICD-10-CM | POA: Diagnosis not present

## 2022-05-27 DIAGNOSIS — N186 End stage renal disease: Secondary | ICD-10-CM | POA: Diagnosis not present

## 2022-05-27 DIAGNOSIS — Z992 Dependence on renal dialysis: Secondary | ICD-10-CM | POA: Diagnosis not present

## 2022-05-27 DIAGNOSIS — R110 Nausea: Secondary | ICD-10-CM | POA: Diagnosis not present

## 2022-05-27 NOTE — Patient Outreach (Signed)
  Care Coordination   05/27/2022 Name: Kelli [name] MRN: [id number] DOB: 07-18-1961   Care Coordination Outreach Attempts:  An unsuccessful telephone outreach was attempted today to offer the patient information about available care coordination services as a benefit of their health plan.   Follow Up Plan:  Additional outreach attempts will be made to offer the patient care coordination information and services.   Encounter Outcome:  No Answer  Care Coordination Interventions Activated:  No   Care Coordination Interventions:  No, not indicated    Noreene Larsson RN, MSN, [facility]  Mobile: [phone]

## 2022-05-27 NOTE — Telephone Encounter (Signed)
Based on the ultrasound the patient should be able to begin cannulating her access.  Patient follow-up in 3 months with an HDA

## 2022-05-28 ENCOUNTER — Telehealth (INDEPENDENT_AMBULATORY_CARE_PROVIDER_SITE_OTHER): Payer: Self-pay | Admitting: Nurse Practitioner

## 2022-05-28 NOTE — Telephone Encounter (Signed)
Consent fax to dialysis

## 2022-05-28 NOTE — Telephone Encounter (Signed)
Patient was called and a message was left for her to contact her dialysis center regarding her question.

## 2022-05-28 NOTE — Telephone Encounter (Signed)
Patient LVM asking for [place] to call dialysis to get the cath out of her chest.  She states this October, it has been a year since it was put in and she wants it out.  Please advise.

## 2022-05-29 DIAGNOSIS — Z23 Encounter for immunization: Secondary | ICD-10-CM | POA: Diagnosis not present

## 2022-05-29 DIAGNOSIS — N2581 Secondary hyperparathyroidism of renal origin: Secondary | ICD-10-CM | POA: Diagnosis not present

## 2022-05-29 DIAGNOSIS — Z992 Dependence on renal dialysis: Secondary | ICD-10-CM | POA: Diagnosis not present

## 2022-05-29 DIAGNOSIS — R110 Nausea: Secondary | ICD-10-CM | POA: Diagnosis not present

## 2022-05-29 DIAGNOSIS — N186 End stage renal disease: Secondary | ICD-10-CM | POA: Diagnosis not present

## 2022-05-30 DIAGNOSIS — I10 Essential (primary) hypertension: Secondary | ICD-10-CM | POA: Diagnosis not present

## 2022-05-30 DIAGNOSIS — I209 Angina pectoris, unspecified: Secondary | ICD-10-CM | POA: Diagnosis not present

## 2022-05-31 DIAGNOSIS — R110 Nausea: Secondary | ICD-10-CM | POA: Diagnosis not present

## 2022-05-31 DIAGNOSIS — Z992 Dependence on renal dialysis: Secondary | ICD-10-CM | POA: Diagnosis not present

## 2022-05-31 DIAGNOSIS — N2581 Secondary hyperparathyroidism of renal origin: Secondary | ICD-10-CM | POA: Diagnosis not present

## 2022-05-31 DIAGNOSIS — Z23 Encounter for immunization: Secondary | ICD-10-CM | POA: Diagnosis not present

## 2022-05-31 DIAGNOSIS — N186 End stage renal disease: Secondary | ICD-10-CM | POA: Diagnosis not present

## 2022-06-03 ENCOUNTER — Other Ambulatory Visit (HOSPITAL_COMMUNITY)
Admission: RE | Admit: 2022-06-03 | Discharge: 2022-06-03 | Disposition: A | Discharge status: Home / Self Care | Payer: BC Managed Care – PPO | Source: Ambulatory Visit | Attending: Internal Medicine | Admitting: Internal Medicine

## 2022-06-03 ENCOUNTER — Encounter: Payer: Self-pay | Admitting: Internal Medicine

## 2022-06-03 ENCOUNTER — Ambulatory Visit (INDEPENDENT_AMBULATORY_CARE_PROVIDER_SITE_OTHER): Payer: BC Managed Care – PPO | Admitting: Internal Medicine

## 2022-06-03 VITALS — BP 136/66 | HR 76 | Ht 66.0 in | Wt 157.0 lb

## 2022-06-03 DIAGNOSIS — Z01419 Encounter for gynecological examination (general) (routine) without abnormal findings: Secondary | ICD-10-CM | POA: Diagnosis not present

## 2022-06-03 DIAGNOSIS — Z992 Dependence on renal dialysis: Secondary | ICD-10-CM | POA: Diagnosis not present

## 2022-06-03 DIAGNOSIS — N2581 Secondary hyperparathyroidism of renal origin: Secondary | ICD-10-CM | POA: Diagnosis not present

## 2022-06-03 DIAGNOSIS — N186 End stage renal disease: Secondary | ICD-10-CM | POA: Diagnosis not present

## 2022-06-03 NOTE — Progress Notes (Signed)
Date:  06/03/2022   Name:  Kelli [name]   DOB:  01/13/1961   MRN:  [id number]   Chief Complaint: Gynecologic Exam  Gynecologic Exam The patient's pertinent negatives include no genital itching, pelvic pain, vaginal bleeding or vaginal discharge. The patient is experiencing no pain. Pertinent negatives include no chills.  She needs a pelvic exam and pap smear in anticipation of renal transplant.  She is s/p total hysterectomy in her 20's for endometriosis.  Lab Results  Component Value Date   NA 137 03/28/2022   K 4.5 03/28/2022   CO2 27 (A) 12/11/2021   GLUCOSE 320 (H) 03/28/2022   BUN 18 03/28/2022   CREATININE 5.20 (H) 03/28/2022   CALCIUM 7.5 (A) 12/11/2021   EGFR 11 12/11/2021   GFRNONAA 11 (L) 12/06/2021   Lab Results  Component Value Date   CHOL 218 (A) 12/11/2021   HDL 109 (A) 12/11/2021   LDLCALC 84 12/11/2021   TRIG 126 12/11/2021   CHOLHDL 4.2 04/10/2021   Lab Results  Component Value Date   TSH 0.619 05/26/2021   Lab Results  Component Value Date   HGBA1C 8.3 04/04/2022   Lab Results  Component Value Date   WBC 6.7 12/11/2021   HGB 13.3 03/28/2022   HCT 39.0 03/28/2022   MCV 93.0 12/06/2021   PLT 260 12/11/2021   Lab Results  Component Value Date   ALT 14 12/11/2021   AST 20 12/11/2021   ALKPHOS 63 12/11/2021   BILITOT 0.8 12/06/2021   No results found for: "25OHVITD2", "25OHVITD3", "VD25OH"   Review of Systems  Constitutional:  Positive for fatigue. Negative for chills.  Respiratory:  Negative for chest tightness.   Genitourinary:  Negative for genital sores, pelvic pain, vaginal bleeding, vaginal discharge and vaginal pain.    Patient Active Problem List   Diagnosis Date Noted   Adrenal mass ([place]) 04/18/2022   OSA (obstructive sleep apnea) 04/17/2022   Coronary artery disease involving native coronary artery of native heart 10/07/2021   S/P drug eluting coronary stent placement 10/07/2021   ESRD on peritoneal dialysis ([place])  08/06/2021   Peptic ulcer disease 08/06/2021   Chest pain    Intractable cyclical vomiting with nausea 03/31/2021   Refusal of blood transfusions as patient is Jehovah's Witness 02/27/2021   Dysuria 01/28/2021   Myalgia due to statin 10/12/2020   Status post hysterectomy 10/02/2020   Psychophysiological insomnia 05/30/2020   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, left eye, 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   Neovascular glaucoma of left eye, 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   Hyperlipidemia due to type 1 diabetes mellitus ([place]) 06/30/2019   Hypomagnesemia 06/30/2019   Neuropathy of left foot 03/18/2017   Arthrosis of midfoot, left 03/18/2017   Charcot foot due to diabetes mellitus ([place]) 02/09/2017   Venous insufficiency of both lower extremities 02/09/2017   Chronic idiopathic constipation 01/26/2017   Abnormal thyroid blood test 09/30/2016   Essential hypertension 08/21/2016   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   Temporomandibular joint pain 11/02/2014   Moderate episode of recurrent major depressive disorder ([place]) 11/02/2014   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[place]) 06/28/2014   Eustachian tube dysfunction 05/10/2014   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   Vitamin D deficiency 12/30/2012   Corns and callosity 07/09/2012   Hammer toe, acquired 07/09/2012    Allergies  Allergen Reactions   Methoxy Polyethylene Glycol-Epoetin Beta Anaphylaxis   Statins Nausea And Vomiting   Albumin Human Other (See Comments)  Refuses all blood products as one of Jehovah's Witnesses    Gabapentin Swelling   Hydrochlorothiazide Nausea And Vomiting   Lisinopril Other (See Comments)    fatigue   Lyrica [Pregabalin] Swelling   Metformin And Related Nausea And Vomiting   Penicillins Nausea And Vomiting    Tolerated CEFAZOLIN on 05/30/2021 and 12/06/2021 with no documented ADRs.    Past Surgical History:  Procedure Laterality Date   ABDOMINAL HYSTERECTOMY     AV  FISTULA PLACEMENT Left 03/28/2022   Procedure: CREATION OF ARTERIOVENOUS (AV) FISTULA;  Surgeon: Katha Cabal, MD;  Location: [facility]S;  Service: Vascular;  Laterality: Left;   CAPD INSERTION N/A 05/30/2021   Procedure: CONTINUOUS AMBULATORY PERITONEAL DIALYSIS  (CAPD) CATHETER INSERTION;  Surgeon: Jules Husbands, MD;  Location: [facility] OR[facility];  Service: General;  Laterality: N/A;   CAPD REMOVAL N/A 12/06/2021   Procedure: REMOVAL OF CONTINUOUS AMBULATORY PERITONEAL DIALYSIS  (CAPD) CATHETER;  Surgeon: Jules Husbands, MD;  Location: [facility] ORS;  Service: General;  Laterality: N/A;   COLONOSCOPY W/ POLYPECTOMY     CORONARY ANGIOPLASTY WITH STENT PLACEMENT Left 10/07/2021   Procedure: CORONARY ANGIOPLASTY WITH STENT PLACEMENT; Location: [facility]; Surgeon: Weyman Pedro, MD   DIALYSIS/PERMA CATHETER INSERTION N/A 05/13/2021   Procedure: DIALYSIS/PERMA CATHETER INSERTION;  Surgeon: Algernon Huxley, MD;  Location: [place] [facility];  Service: Cardiovascular;  Laterality: N/A;   ESOPHAGOGASTRODUODENOSCOPY  03/02/2017   Normal   HEMORRHOID SURGERY      Social History   Tobacco Use   Smoking status: Former    Passive exposure: Never   Smokeless tobacco: Never  Vaping Use   Vaping Use: Never used  Substance Use Topics   Alcohol use: No   Drug use: No     Medication list has been reviewed and updated.  Current Meds  Medication Sig   acetaminophen (TYLENOL) 500 MG tablet Take 1 tablet by mouth every 8 (eight) hours as needed.   aspirin EC 81 MG tablet Take 81 mg by mouth daily. Swallow whole.   BD PEN NEEDLE NANO U/F 32G X 4 MM MISC Inject 1 each into the skin 3 (three) times daily.   benzonatate (TESSALON) 100 MG capsule Take 2 capsules (200 mg total) by mouth every 8 (eight) hours.   brimonidine (ALPHAGAN) 0.2 % ophthalmic solution Place 1 drop into the left eye 2 (two) times daily.   calcium acetate (PHOSLO) 667 MG capsule Take 667 mg by mouth 3 (three) times daily.   carvedilol (COREG) 25  MG tablet Take 12.5 mg by mouth 2 (two) times daily.   clopidogrel (PLAVIX) 75 MG tablet Take 75 mg by mouth daily.   Continuous Blood Gluc Receiver (FREESTYLE LIBRE 2 READER) DEVI USE AS DIRECTED   Continuous Blood Gluc Sensor (FREESTYLE LIBRE 2 SENSOR) MISC USE AS DIRECTED EVERY 14 DAYS   dorzolamide-timolol (COSOPT) 22.3-6.8 MG/ML ophthalmic solution SMARTSIG:1 Drop(s) Left Eye Every 12 Hours   Epoetin Alfa (EPOGEN IJ) Epoetin Alfa (Epogen)   escitalopram (LEXAPRO) 5 MG tablet TAKE 1 TABLET (5 MG TOTAL) BY MOUTH DAILY.   ezetimibe (ZETIA) 10 MG tablet Take 10 mg by mouth daily.   glucose blood (FREESTYLE LITE) test strip 1 each by Other route five (5) times a day.   HYDROcodone-acetaminophen (NORCO) 5-325 MG tablet Take 1-2 tablets by mouth every 6 (six) hours as needed for moderate pain or severe pain.   insulin glargine (LANTUS SOLOSTAR) 100 UNIT/ML Solostar Pen Inject 16 Units into the skin daily.  morning   insulin lispro (HUMALOG) 100 UNIT/ML injection Inject 5 Units into the skin 3 (three) times daily with meals.   latanoprost (XALATAN) 0.005 % ophthalmic solution Place 1 drop into the left eye at bedtime.   losartan (COZAAR) 100 MG tablet Take by mouth.   nitroGLYCERIN (NITROSTAT) 0.4 MG SL tablet Place under the tongue.   promethazine (PHENERGAN) 12.5 MG tablet Take 1 tablet (12.5 mg total) by mouth every 8 (eight) hours as needed for nausea or vomiting.   promethazine-dextromethorphan (PROMETHAZINE-DM) 6.25-15 MG/5ML syrup Take 5 mLs by mouth 4 (four) times daily as needed.   RABEprazole (ACIPHEX) 20 MG tablet Take 20 mg by mouth daily.   rosuvastatin (CRESTOR) 10 MG tablet Take 10 mg by mouth daily.       06/03/2022    3:32 PM 04/18/2022    2:04 PM 08/06/2021    2:48 PM 04/10/2021    3:41 PM  GAD 7 : Generalized Anxiety Score  Nervous, Anxious, on Edge 0 0 0 0  Control/stop worrying 0 0 0 0  Worry too much - different things 0 0 0 0  Trouble relaxing 0 0 0 0  Restless 0 0 0  0  Easily annoyed or irritable 0 0 0 0  Afraid - awful might happen 0 0 0 0  Total GAD 7 Score 0 0 0 0  Anxiety Difficulty Not difficult at all Not difficult at all Not difficult at all Not difficult at all       06/03/2022    3:32 PM 04/18/2022    2:04 PM 08/06/2021    2:48 PM  Depression screen PHQ 2/9  Decreased Interest 0 0 0  Down, Depressed, Hopeless 0 0 0  PHQ - 2 Score 0 0 0  Altered sleeping 0 0 0  Tired, decreased energy 0 0 0  Change in appetite 0 0 0  Feeling bad or failure about yourself  0 0 0  Trouble concentrating 0 0 0  Moving slowly or fidgety/restless 0 0 0  Suicidal thoughts 0 0 0  PHQ-9 Score 0 0 0  Difficult doing work/chores Not difficult at all Not difficult at all Not difficult at all    BP Readings from Last 3 Encounters:  06/03/22 136/66  05/22/22 (!) 160/63  04/18/22 (!) 142/82    Physical Exam Vitals and nursing note reviewed.  Constitutional:      General: She is not in acute distress.    Appearance: She is well-developed.  HENT:     Head: Normocephalic and atraumatic.  Pulmonary:     Effort: Pulmonary effort is normal. No respiratory distress.  Abdominal:     General: Abdomen is flat. Bowel sounds are normal.     Palpations: Abdomen is soft.     Tenderness: There is no abdominal tenderness.  Genitourinary:    Labia:        Right: No tenderness, lesion or injury.        Left: No tenderness, lesion or injury.      Vagina: Normal. No vaginal discharge, erythema, tenderness, bleeding or lesions.     Uterus: Absent.      Adnexa:        Right: No mass, tenderness or fullness.         Left: No mass, tenderness or fullness.       Rectum: Normal.     Comments: Cervix absent Pap obtained from the vaginal cuff Skin:    General: Skin is warm and  dry.     Findings: No rash.  Neurological:     Mental Status: She is alert and oriented to person, place, and time.  Psychiatric:        Mood and Affect: Mood normal.        Behavior: Behavior  normal.     Wt Readings from Last 3 Encounters:  06/03/22 157 lb (71.2 kg)  05/22/22 150 lb (68 kg)  04/18/22 161 lb (73 kg)    BP 136/66 (BP Location: Right Arm, Cuff Size: Normal)   Pulse 76   Ht 5' 6"  (1.676 m)   Wt 157 lb (71.2 kg)   SpO2 93%   BMI 25.34 kg/m   Assessment and Plan: 1. Encounter for gynecological examination Normal exam post hysterectomy. Pap with HPV obtained and submitted to the lab Will forward results to [facility] Transplant team phone [phone]   - Cytology - PAP   Partially dictated using Dragon software. Any errors are unintentional.  Halina Maidens, MD [facility]  06/03/2022

## 2022-06-04 DIAGNOSIS — E119 Type 2 diabetes mellitus without complications: Secondary | ICD-10-CM | POA: Diagnosis not present

## 2022-06-04 DIAGNOSIS — I209 Angina pectoris, unspecified: Secondary | ICD-10-CM | POA: Diagnosis not present

## 2022-06-04 DIAGNOSIS — E876 Hypokalemia: Secondary | ICD-10-CM | POA: Diagnosis not present

## 2022-06-04 DIAGNOSIS — E1142 Type 2 diabetes mellitus with diabetic polyneuropathy: Secondary | ICD-10-CM | POA: Diagnosis not present

## 2022-06-05 DIAGNOSIS — N186 End stage renal disease: Secondary | ICD-10-CM | POA: Diagnosis not present

## 2022-06-05 DIAGNOSIS — N2581 Secondary hyperparathyroidism of renal origin: Secondary | ICD-10-CM | POA: Diagnosis not present

## 2022-06-05 DIAGNOSIS — Z992 Dependence on renal dialysis: Secondary | ICD-10-CM | POA: Diagnosis not present

## 2022-06-05 LAB — CYTOLOGY - PAP
Comment: NEGATIVE
Diagnosis: NEGATIVE
High risk HPV: NEGATIVE

## 2022-06-06 DIAGNOSIS — I132 Hypertensive heart and chronic kidney disease with heart failure and with stage 5 chronic kidney disease, or end stage renal disease: Secondary | ICD-10-CM | POA: Diagnosis not present

## 2022-06-06 DIAGNOSIS — S2241XD Multiple fractures of ribs, right side, subsequent encounter for fracture with routine healing: Secondary | ICD-10-CM | POA: Diagnosis not present

## 2022-06-06 DIAGNOSIS — N179 Acute kidney failure, unspecified: Secondary | ICD-10-CM | POA: Diagnosis not present

## 2022-06-06 DIAGNOSIS — J811 Chronic pulmonary edema: Secondary | ICD-10-CM | POA: Diagnosis not present

## 2022-06-06 DIAGNOSIS — I10 Essential (primary) hypertension: Secondary | ICD-10-CM | POA: Diagnosis not present

## 2022-06-06 DIAGNOSIS — R0689 Other abnormalities of breathing: Secondary | ICD-10-CM | POA: Diagnosis not present

## 2022-06-06 DIAGNOSIS — G8911 Acute pain due to trauma: Secondary | ICD-10-CM | POA: Diagnosis not present

## 2022-06-06 DIAGNOSIS — G4733 Obstructive sleep apnea (adult) (pediatric): Secondary | ICD-10-CM | POA: Diagnosis not present

## 2022-06-06 DIAGNOSIS — S199XXA Unspecified injury of neck, initial encounter: Secondary | ICD-10-CM | POA: Diagnosis not present

## 2022-06-06 DIAGNOSIS — Z87891 Personal history of nicotine dependence: Secondary | ICD-10-CM | POA: Diagnosis not present

## 2022-06-06 DIAGNOSIS — N186 End stage renal disease: Secondary | ICD-10-CM | POA: Diagnosis not present

## 2022-06-06 DIAGNOSIS — I4581 Long QT syndrome: Secondary | ICD-10-CM | POA: Diagnosis not present

## 2022-06-06 DIAGNOSIS — T827XXA Infection and inflammatory reaction due to other cardiac and vascular devices, implants and grafts, initial encounter: Secondary | ICD-10-CM | POA: Diagnosis not present

## 2022-06-06 DIAGNOSIS — Z01818 Encounter for other preprocedural examination: Secondary | ICD-10-CM | POA: Diagnosis not present

## 2022-06-06 DIAGNOSIS — S299XXA Unspecified injury of thorax, initial encounter: Secondary | ICD-10-CM | POA: Diagnosis not present

## 2022-06-06 DIAGNOSIS — J9601 Acute respiratory failure with hypoxia: Secondary | ICD-10-CM | POA: Diagnosis not present

## 2022-06-06 DIAGNOSIS — S0990XA Unspecified injury of head, initial encounter: Secondary | ICD-10-CM | POA: Diagnosis not present

## 2022-06-06 DIAGNOSIS — M79622 Pain in left upper arm: Secondary | ICD-10-CM | POA: Diagnosis not present

## 2022-06-06 DIAGNOSIS — I5022 Chronic systolic (congestive) heart failure: Secondary | ICD-10-CM | POA: Diagnosis not present

## 2022-06-06 DIAGNOSIS — J90 Pleural effusion, not elsewhere classified: Secondary | ICD-10-CM | POA: Diagnosis not present

## 2022-06-06 DIAGNOSIS — K219 Gastro-esophageal reflux disease without esophagitis: Secondary | ICD-10-CM | POA: Diagnosis not present

## 2022-06-06 DIAGNOSIS — I2510 Atherosclerotic heart disease of native coronary artery without angina pectoris: Secondary | ICD-10-CM | POA: Diagnosis not present

## 2022-06-06 DIAGNOSIS — S0031XA Abrasion of nose, initial encounter: Secondary | ICD-10-CM | POA: Diagnosis not present

## 2022-06-06 DIAGNOSIS — F419 Anxiety disorder, unspecified: Secondary | ICD-10-CM | POA: Diagnosis not present

## 2022-06-06 DIAGNOSIS — E1022 Type 1 diabetes mellitus with diabetic chronic kidney disease: Secondary | ICD-10-CM | POA: Diagnosis not present

## 2022-06-06 DIAGNOSIS — R112 Nausea with vomiting, unspecified: Secondary | ICD-10-CM | POA: Diagnosis not present

## 2022-06-06 DIAGNOSIS — S2241XA Multiple fractures of ribs, right side, initial encounter for closed fracture: Secondary | ICD-10-CM | POA: Diagnosis not present

## 2022-06-06 DIAGNOSIS — R110 Nausea: Secondary | ICD-10-CM | POA: Diagnosis not present

## 2022-06-06 DIAGNOSIS — Z992 Dependence on renal dialysis: Secondary | ICD-10-CM | POA: Diagnosis not present

## 2022-06-06 DIAGNOSIS — D631 Anemia in chronic kidney disease: Secondary | ICD-10-CM | POA: Diagnosis not present

## 2022-06-06 DIAGNOSIS — S27321A Contusion of lung, unilateral, initial encounter: Secondary | ICD-10-CM | POA: Diagnosis not present

## 2022-06-06 DIAGNOSIS — S3991XA Unspecified injury of abdomen, initial encounter: Secondary | ICD-10-CM | POA: Diagnosis not present

## 2022-06-06 DIAGNOSIS — S6992XA Unspecified injury of left wrist, hand and finger(s), initial encounter: Secondary | ICD-10-CM | POA: Diagnosis not present

## 2022-06-06 DIAGNOSIS — J9811 Atelectasis: Secondary | ICD-10-CM | POA: Diagnosis not present

## 2022-06-06 DIAGNOSIS — F32A Depression, unspecified: Secondary | ICD-10-CM | POA: Diagnosis not present

## 2022-06-06 DIAGNOSIS — S3993XA Unspecified injury of pelvis, initial encounter: Secondary | ICD-10-CM | POA: Diagnosis not present

## 2022-06-06 DIAGNOSIS — S3992XA Unspecified injury of lower back, initial encounter: Secondary | ICD-10-CM | POA: Diagnosis not present

## 2022-06-06 DIAGNOSIS — R079 Chest pain, unspecified: Secondary | ICD-10-CM | POA: Diagnosis not present

## 2022-06-06 DIAGNOSIS — J810 Acute pulmonary edema: Secondary | ICD-10-CM | POA: Diagnosis not present

## 2022-06-06 DIAGNOSIS — I517 Cardiomegaly: Secondary | ICD-10-CM | POA: Diagnosis not present

## 2022-06-06 DIAGNOSIS — Y92410 Unspecified street and highway as the place of occurrence of the external cause: Secondary | ICD-10-CM | POA: Diagnosis not present

## 2022-06-06 DIAGNOSIS — M79642 Pain in left hand: Secondary | ICD-10-CM | POA: Diagnosis not present

## 2022-06-06 DIAGNOSIS — M79645 Pain in left finger(s): Secondary | ICD-10-CM | POA: Diagnosis not present

## 2022-06-06 DIAGNOSIS — M25542 Pain in joints of left hand: Secondary | ICD-10-CM | POA: Diagnosis not present

## 2022-06-15 ENCOUNTER — Other Ambulatory Visit: Payer: Self-pay | Admitting: Internal Medicine

## 2022-06-17 NOTE — Telephone Encounter (Signed)
Requested Prescriptions  Pending Prescriptions Disp Refills  . Continuous Blood Gluc Sensor (FREESTYLE LIBRE 2 SENSOR) MISC [Pharmacy Med Name: FREESTYLE LIBRE 2 SENSOR] 2 each 3    Sig: USE AS DIRECTED EVERY 29 DAYS     Endocrinology: Diabetes - Testing Supplies Passed - 06/15/2022  9:32 AM      Passed - Valid encounter within last 12 months    Recent Outpatient Visits          2 weeks ago Encounter for gynecological examination   [place] [facility] at [facility], Jesse Sans, MD   2 months ago Essential hypertension   [place] [facility] at [facility], Jesse Sans, MD   10 months ago Sleep disorder breathing   [place] [facility] at [facility], Jesse Sans, MD   1 year ago Annual physical exam   [facility] at [facility], Jesse Sans, MD   1 year ago Acute cystitis without hematuria   [place] [facility] at [facility], Jesse Sans, MD      Future Appointments            In 3 weeks Army Melia Jesse Sans, MD [place] [facility] at [facility]

## 2022-06-21 DIAGNOSIS — N2581 Secondary hyperparathyroidism of renal origin: Secondary | ICD-10-CM | POA: Diagnosis not present

## 2022-06-21 DIAGNOSIS — N186 End stage renal disease: Secondary | ICD-10-CM | POA: Diagnosis not present

## 2022-06-21 DIAGNOSIS — Z992 Dependence on renal dialysis: Secondary | ICD-10-CM | POA: Diagnosis not present

## 2022-06-23 ENCOUNTER — Telehealth: Payer: Self-pay | Admitting: Internal Medicine

## 2022-06-23 NOTE — Telephone Encounter (Signed)
Called and informed pt on this information on VM - could not reach. Told pt to call back with any questions if needed.  - Kelli [name]
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
## 2022-06-24 DIAGNOSIS — N2581 Secondary hyperparathyroidism of renal origin: Secondary | ICD-10-CM | POA: Diagnosis not present

## 2022-06-24 DIAGNOSIS — Z992 Dependence on renal dialysis: Secondary | ICD-10-CM | POA: Diagnosis not present

## 2022-06-24 DIAGNOSIS — N186 End stage renal disease: Secondary | ICD-10-CM | POA: Diagnosis not present

## 2022-06-26 DIAGNOSIS — N2581 Secondary hyperparathyroidism of renal origin: Secondary | ICD-10-CM | POA: Diagnosis not present

## 2022-06-26 DIAGNOSIS — R110 Nausea: Secondary | ICD-10-CM | POA: Diagnosis not present

## 2022-06-26 DIAGNOSIS — N186 End stage renal disease: Secondary | ICD-10-CM | POA: Diagnosis not present

## 2022-06-26 DIAGNOSIS — Z992 Dependence on renal dialysis: Secondary | ICD-10-CM | POA: Diagnosis not present

## 2022-06-28 DIAGNOSIS — Z992 Dependence on renal dialysis: Secondary | ICD-10-CM | POA: Diagnosis not present

## 2022-06-28 DIAGNOSIS — R110 Nausea: Secondary | ICD-10-CM | POA: Diagnosis not present

## 2022-06-28 DIAGNOSIS — N2581 Secondary hyperparathyroidism of renal origin: Secondary | ICD-10-CM | POA: Diagnosis not present

## 2022-06-28 DIAGNOSIS — N186 End stage renal disease: Secondary | ICD-10-CM | POA: Diagnosis not present

## 2022-07-01 ENCOUNTER — Ambulatory Visit (INDEPENDENT_AMBULATORY_CARE_PROVIDER_SITE_OTHER): Payer: BC Managed Care – PPO | Admitting: Internal Medicine

## 2022-07-01 ENCOUNTER — Encounter: Payer: Self-pay | Admitting: Internal Medicine

## 2022-07-01 VITALS — BP 136/82 | HR 76 | Ht 66.0 in | Wt 150.0 lb

## 2022-07-01 DIAGNOSIS — I10 Essential (primary) hypertension: Secondary | ICD-10-CM | POA: Diagnosis not present

## 2022-07-01 DIAGNOSIS — F331 Major depressive disorder, recurrent, moderate: Secondary | ICD-10-CM | POA: Diagnosis not present

## 2022-07-01 DIAGNOSIS — K279 Peptic ulcer, site unspecified, unspecified as acute or chronic, without hemorrhage or perforation: Secondary | ICD-10-CM | POA: Diagnosis not present

## 2022-07-01 DIAGNOSIS — R1115 Cyclical vomiting syndrome unrelated to migraine: Secondary | ICD-10-CM | POA: Diagnosis not present

## 2022-07-01 DIAGNOSIS — N2581 Secondary hyperparathyroidism of renal origin: Secondary | ICD-10-CM | POA: Diagnosis not present

## 2022-07-01 DIAGNOSIS — N186 End stage renal disease: Secondary | ICD-10-CM | POA: Diagnosis not present

## 2022-07-01 DIAGNOSIS — Z992 Dependence on renal dialysis: Secondary | ICD-10-CM | POA: Diagnosis not present

## 2022-07-01 NOTE — Progress Notes (Signed)
Date:  07/01/2022   Name:  Kelli [name]   DOB:  Nov 17, 1960   MRN:  [id number]   Chief Complaint: Vomiting (Been vomiting and nauseous for years and wants to be tested for H Pylori. Patient has acid reflux as well. )  Abdominal Pain This is a chronic problem. Associated symptoms include belching, nausea and vomiting. The pain is relieved by Nothing. She has tried proton pump inhibitors for the symptoms. The treatment provided mild relief. Prior diagnostic workup includes upper endoscopy (H Pylori negative 2018 - not tested since).  Depression        This is a chronic problem.The problem is unchanged.  Associated symptoms include fatigue.  Past treatments include SSRIs - Selective serotonin reuptake inhibitors.  Compliance with treatment is good.  Past compliance problems: having nightmares and wants to come off of meds.   Lab Results  Component Value Date   NA 137 03/28/2022   K 4.5 03/28/2022   CO2 27 (A) 12/11/2021   GLUCOSE 320 (H) 03/28/2022   BUN 18 03/28/2022   CREATININE 5.20 (H) 03/28/2022   CALCIUM 7.5 (A) 12/11/2021   EGFR 11 12/11/2021   GFRNONAA 11 (L) 12/06/2021   Lab Results  Component Value Date   CHOL 218 (A) 12/11/2021   HDL 109 (A) 12/11/2021   LDLCALC 84 12/11/2021   TRIG 126 12/11/2021   CHOLHDL 4.2 04/10/2021   Lab Results  Component Value Date   TSH 0.619 05/26/2021   Lab Results  Component Value Date   HGBA1C 8.3 04/04/2022   Lab Results  Component Value Date   WBC 6.7 12/11/2021   HGB 13.3 03/28/2022   HCT 39.0 03/28/2022   MCV 93.0 12/06/2021   PLT 260 12/11/2021   Lab Results  Component Value Date   ALT 14 12/11/2021   AST 20 12/11/2021   ALKPHOS 63 12/11/2021   BILITOT 0.8 12/06/2021   No results found for: "25OHVITD2", "25OHVITD3", "VD25OH"   Review of Systems  Constitutional:  Positive for fatigue. Negative for chills.  Respiratory:  Negative for chest tightness and shortness of breath.   Cardiovascular:  Negative for  chest pain.  Gastrointestinal:  Positive for abdominal pain, nausea and vomiting.  Psychiatric/Behavioral:  Positive for depression, dysphoric mood and sleep disturbance. The patient is nervous/anxious.     Patient Active Problem List   Diagnosis Date Noted   Adrenal mass ([place]) 04/18/2022   OSA (obstructive sleep apnea) 04/17/2022   Coronary artery disease involving native coronary artery of native heart 10/07/2021   S/P drug eluting coronary stent placement 10/07/2021   ESRD on peritoneal dialysis ([place]) 08/06/2021   Peptic ulcer disease 08/06/2021   Chest pain    Intractable cyclical vomiting with nausea 03/31/2021   Refusal of blood transfusions as patient is Jehovah's Witness 02/27/2021   Dysuria 01/28/2021   Myalgia due to statin 10/12/2020   Status post hysterectomy 10/02/2020   Psychophysiological insomnia 05/30/2020   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, left eye, 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   Neovascular glaucoma of left eye, 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   Hyperlipidemia due to type 1 diabetes mellitus ([place]) 06/30/2019   Hypomagnesemia 06/30/2019   Neuropathy of left foot 03/18/2017   Arthrosis of midfoot, left 03/18/2017   Charcot foot due to diabetes mellitus ([place]) 02/09/2017   Venous insufficiency of both lower extremities 02/09/2017   Chronic idiopathic constipation 01/26/2017   Abnormal thyroid blood test 09/30/2016   Essential hypertension 08/21/2016   Myelolipoma of right adrenal gland  08/18/2016   Temporomandibular joint pain 11/02/2014   Moderate episode of recurrent major depressive disorder ([place]) 11/02/2014   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[place]) 06/28/2014   Eustachian tube dysfunction 05/10/2014   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   Vitamin D deficiency 12/30/2012   Corns and callosity 07/09/2012   Hammer toe, acquired 07/09/2012    Allergies  Allergen Reactions   Methoxy Polyethylene Glycol-Epoetin Beta Anaphylaxis   Statins  Nausea And Vomiting   Albumin Human Other (See Comments)    Refuses all blood products as one of Jehovah's Witnesses    Gabapentin Swelling   Hydrochlorothiazide Nausea And Vomiting   Lisinopril Other (See Comments)    fatigue   Lyrica [Pregabalin] Swelling   Metformin And Related Nausea And Vomiting   Penicillins Nausea And Vomiting    Tolerated CEFAZOLIN on 05/30/2021 and 12/06/2021 with no documented ADRs.    Past Surgical History:  Procedure Laterality Date   ABDOMINAL HYSTERECTOMY     AV FISTULA PLACEMENT Left 03/28/2022   Procedure: CREATION OF ARTERIOVENOUS (AV) FISTULA;  Surgeon: Katha Cabal, MD;  Location: [facility] ORS;  Service: Vascular;  Laterality: Left;   CAPD INSERTION N/A 05/30/2021   Procedure: CONTINUOUS AMBULATORY PERITONEAL DIALYSIS  (CAPD) CATHETER INSERTION;  Surgeon: Jules Husbands, MD;  Location: [facility];  Service: General;  Laterality: N/A;   CAPD REMOVAL N/A 12/06/2021   Procedure: REMOVAL OF CONTINUOUS AMBULATORY PERITONEAL DIALYSIS  (CAPD) CATHETER;  Surgeon: Jules Husbands, MD;  Location: [facility];  Service: General;  Laterality: N/A;   COLONOSCOPY W/ POLYPECTOMY     CORONARY ANGIOPLASTY WITH STENT PLACEMENT Left 10/07/2021   Procedure: CORONARY ANGIOPLASTY WITH STENT PLACEMENT; Location: [facility]; Surgeon: Weyman Pedro, MD   DIALYSIS/PERMA CATHETER INSERTION N/A 05/13/2021   Procedure: DIALYSIS/PERMA CATHETER INSERTION;  Surgeon: Algernon Huxley, MD;  Location: [place] [facility];  Service: Cardiovascular;  Laterality: N/A;   ESOPHAGOGASTRODUODENOSCOPY  03/02/2017   Normal   HEMORRHOID SURGERY      Social History   Tobacco Use   Smoking status: Former    Passive exposure: Never   Smokeless tobacco: Never  Vaping Use   Vaping Use: Never used  Substance Use Topics   Alcohol use: No   Drug use: No     Medication list has been reviewed and updated.  Current Meds  Medication Sig   acetaminophen (TYLENOL) 500 MG tablet Take 1 tablet by mouth  every 8 (eight) hours as needed.   amLODipine (NORVASC) 10 MG tablet Take 10 mg by mouth daily.   aspirin EC 81 MG tablet Take 81 mg by mouth daily. Swallow whole.   BD PEN NEEDLE NANO U/F 32G X 4 MM MISC Inject 1 each into the skin 3 (three) times daily.   brimonidine (ALPHAGAN) 0.2 % ophthalmic solution Place 1 drop into the left eye 2 (two) times daily.   calcium acetate (PHOSLO) 667 MG capsule Take 667 mg by mouth 3 (three) times daily.   carvedilol (COREG) 25 MG tablet Take 12.5 mg by mouth 2 (two) times daily.   clopidogrel (PLAVIX) 75 MG tablet Take 75 mg by mouth daily.   Continuous Blood Gluc Receiver (FREESTYLE LIBRE 2 READER) DEVI USE AS DIRECTED   Continuous Blood Gluc Sensor (FREESTYLE LIBRE 2 SENSOR) MISC USE AS DIRECTED EVERY 14 DAYS   dorzolamide-timolol (COSOPT) 22.3-6.8 MG/ML ophthalmic solution SMARTSIG:1 Drop(s) Left Eye Every 12 Hours   escitalopram (LEXAPRO) 5 MG tablet TAKE 1 TABLET (5 MG  TOTAL) BY MOUTH DAILY.   ezetimibe (ZETIA) 10 MG tablet Take 10 mg by mouth daily.   glucose blood (FREESTYLE LITE) test strip 1 each by Other route five (5) times a day.   HYDROcodone-acetaminophen (NORCO) 5-325 MG tablet Take 1-2 tablets by mouth every 6 (six) hours as needed for moderate pain or severe pain.   insulin glargine (LANTUS SOLOSTAR) 100 UNIT/ML Solostar Pen Inject 16 Units into the skin daily. morning   insulin lispro (HUMALOG) 100 UNIT/ML injection Inject 5 Units into the skin 3 (three) times daily with meals.   latanoprost (XALATAN) 0.005 % ophthalmic solution Place 1 drop into the left eye at bedtime.   losartan (COZAAR) 100 MG tablet Take by mouth.   nitroGLYCERIN (NITROSTAT) 0.4 MG SL tablet Place under the tongue.   promethazine (PHENERGAN) 12.5 MG tablet Take 1 tablet (12.5 mg total) by mouth every 8 (eight) hours as needed for nausea or vomiting.   RABEprazole (ACIPHEX) 20 MG tablet Take 20 mg by mouth daily.   rosuvastatin (CRESTOR) 10 MG tablet Take 10 mg by  mouth daily.   senna-docusate (SENOKOT-S) 8.6-50 MG tablet Take 2 tablets by mouth at bedtime.       06/03/2022    3:32 PM 04/18/2022    2:04 PM 08/06/2021    2:48 PM 04/10/2021    3:41 PM  GAD 7 : Generalized Anxiety Score  Nervous, Anxious, on Edge 0 0 0 0  Control/stop worrying 0 0 0 0  Worry too much - different things 0 0 0 0  Trouble relaxing 0 0 0 0  Restless 0 0 0 0  Easily annoyed or irritable 0 0 0 0  Afraid - awful might happen 0 0 0 0  Total GAD 7 Score 0 0 0 0  Anxiety Difficulty Not difficult at all Not difficult at all Not difficult at all Not difficult at all       06/03/2022    3:32 PM 04/18/2022    2:04 PM 08/06/2021    2:48 PM  Depression screen PHQ 2/9  Decreased Interest 0 0 0  Down, Depressed, Hopeless 0 0 0  PHQ - 2 Score 0 0 0  Altered sleeping 0 0 0  Tired, decreased energy 0 0 0  Change in appetite 0 0 0  Feeling bad or failure about yourself  0 0 0  Trouble concentrating 0 0 0  Moving slowly or fidgety/restless 0 0 0  Suicidal thoughts 0 0 0  PHQ-9 Score 0 0 0  Difficult doing work/chores Not difficult at all Not difficult at all Not difficult at all    BP Readings from Last 3 Encounters:  07/01/22 136/82  06/03/22 136/66  05/22/22 (!) 160/63    Physical Exam Vitals and nursing note reviewed.  Constitutional:      General: She is not in acute distress.    Appearance: She is well-developed. She is ill-appearing.  HENT:     Head: Normocephalic and atraumatic.  Cardiovascular:     Rate and Rhythm: Normal rate and regular rhythm.  Pulmonary:     Effort: Pulmonary effort is normal. No respiratory distress.     Breath sounds: No wheezing or rhonchi.  Skin:    General: Skin is warm and dry.     Findings: No rash.  Neurological:     Mental Status: She is alert and oriented to person, place, and time.  Psychiatric:        Attention and Perception: Attention normal.  Mood and Affect: Affect is flat.        Speech: Speech normal.         Thought Content: Thought content does not include suicidal ideation. Thought content does not include suicidal plan.     Wt Readings from Last 3 Encounters:  07/01/22 150 lb (68 kg)  06/03/22 157 lb (71.2 kg)  05/22/22 150 lb (68 kg)    BP 136/82 (BP Location: Right Arm, Cuff Size: Normal)   Pulse 76   Ht _0  (1.676 m)   Wt 150 lb (68 kg)   SpO2 96%   BMI 24.21 kg/m   Assessment and Plan: 1. Peptic ulcer disease Will obtain H Pylori test and treat if positive - H. pylori breath test  2. Intractable cyclical vomiting with nausea - H. pylori breath test  3. Moderate episode of recurrent major depressive disorder (HCC) Only on Lexapro 5 mg  Taper by taking 2.5 mg daily for 2 weeks then discontinue. Follow up if needed.   Partially dictated using Editor, commissioning. Any errors are unintentional.  Halina Maidens, MD [facility]  07/01/2022

## 2022-07-01 NOTE — Telephone Encounter (Unsigned)
Copied from [place] [id number]. Topic: General - Inquiry >> Jul 01, 2022  8:20 AM Ludger Nutting wrote: Patient's husband called to ask about having patient tested for H-Pylori and if that is something that can be done in the office. Please follow up with patient's husband.

## 2022-07-02 ENCOUNTER — Inpatient Hospital Stay: Payer: BC Managed Care – PPO | Admitting: Internal Medicine

## 2022-07-02 DIAGNOSIS — S2241XD Multiple fractures of ribs, right side, subsequent encounter for fracture with routine healing: Secondary | ICD-10-CM | POA: Diagnosis not present

## 2022-07-02 DIAGNOSIS — I10 Essential (primary) hypertension: Secondary | ICD-10-CM | POA: Diagnosis not present

## 2022-07-03 DIAGNOSIS — N2581 Secondary hyperparathyroidism of renal origin: Secondary | ICD-10-CM | POA: Diagnosis not present

## 2022-07-03 DIAGNOSIS — N186 End stage renal disease: Secondary | ICD-10-CM | POA: Diagnosis not present

## 2022-07-03 DIAGNOSIS — I10 Essential (primary) hypertension: Secondary | ICD-10-CM | POA: Diagnosis not present

## 2022-07-03 DIAGNOSIS — Z992 Dependence on renal dialysis: Secondary | ICD-10-CM | POA: Diagnosis not present

## 2022-07-03 LAB — H. PYLORI BREATH TEST: H pylori Breath Test: NEGATIVE

## 2022-07-03 NOTE — Telephone Encounter (Unsigned)
Copied from [place] [id number]. Topic: General - Other >> Jul 03, 2022  4:18 PM Everette C wrote: Reason for CRM: The patient has called to request orders for a chest and wrist xray   Please contact the patient further when possible

## 2022-07-04 NOTE — Telephone Encounter (Signed)
Please call pt to schedule an appointment. Dr. Army Melia needs to access her first. KP

## 2022-07-04 NOTE — Telephone Encounter (Signed)
Same day is fine.  KP

## 2022-07-05 DIAGNOSIS — Z992 Dependence on renal dialysis: Secondary | ICD-10-CM | POA: Diagnosis not present

## 2022-07-05 DIAGNOSIS — I10 Essential (primary) hypertension: Secondary | ICD-10-CM | POA: Diagnosis not present

## 2022-07-05 DIAGNOSIS — N186 End stage renal disease: Secondary | ICD-10-CM | POA: Diagnosis not present

## 2022-07-05 DIAGNOSIS — N2581 Secondary hyperparathyroidism of renal origin: Secondary | ICD-10-CM | POA: Diagnosis not present

## 2022-07-07 ENCOUNTER — Ambulatory Visit: Payer: BC Managed Care – PPO | Admitting: Internal Medicine

## 2022-07-08 DIAGNOSIS — N186 End stage renal disease: Secondary | ICD-10-CM | POA: Diagnosis not present

## 2022-07-08 DIAGNOSIS — Z992 Dependence on renal dialysis: Secondary | ICD-10-CM | POA: Diagnosis not present

## 2022-07-08 DIAGNOSIS — R110 Nausea: Secondary | ICD-10-CM | POA: Diagnosis not present

## 2022-07-08 DIAGNOSIS — N2581 Secondary hyperparathyroidism of renal origin: Secondary | ICD-10-CM | POA: Diagnosis not present

## 2022-07-08 DIAGNOSIS — I10 Essential (primary) hypertension: Secondary | ICD-10-CM | POA: Diagnosis not present

## 2022-07-09 ENCOUNTER — Inpatient Hospital Stay: Payer: BC Managed Care – PPO | Admitting: Internal Medicine

## 2022-07-10 DIAGNOSIS — N2581 Secondary hyperparathyroidism of renal origin: Secondary | ICD-10-CM | POA: Diagnosis not present

## 2022-07-10 DIAGNOSIS — I10 Essential (primary) hypertension: Secondary | ICD-10-CM | POA: Diagnosis not present

## 2022-07-10 DIAGNOSIS — R110 Nausea: Secondary | ICD-10-CM | POA: Diagnosis not present

## 2022-07-10 DIAGNOSIS — Z992 Dependence on renal dialysis: Secondary | ICD-10-CM | POA: Diagnosis not present

## 2022-07-10 DIAGNOSIS — N186 End stage renal disease: Secondary | ICD-10-CM | POA: Diagnosis not present

## 2022-07-11 ENCOUNTER — Encounter: Payer: BC Managed Care – PPO | Admitting: Internal Medicine

## 2022-07-11 NOTE — Progress Notes (Deleted)
Date:  07/11/2022   Name:  Kelli [name]   DOB:  [birth year]   MRN:  [id number]   Chief Complaint: No chief complaint on file. Kelli [name] is a 61 y.o. female who presents today for her Complete Annual Exam. She feels {DESC; WELL/FAIRLY WELL/POORLY:18703}. She reports exercising ***. She reports she is sleeping {DESC; WELL/FAIRLY WELL/POORLY:18703}. Breast complaints ***.  Mammogram: 04/2022 DEXA: none Pap smear: 05/2022 neg/neg Colonoscopy: 12/2015 repeat 10 yrs  Health Maintenance Due  Topic Date Due   COVID-19 Vaccine (3 - Moderna series) 01/19/2020    Immunization History  Administered Date(s) Administered   Hepb-cpg 09/04/2021, 10/10/2021, 11/05/2021, 01/02/2022, 03/29/2022, 05/03/2022, 05/31/2022   Influenza Whole 10/09/2010   Influenza, Quadrivalent, Recombinant, Inj, Pf 06/05/2022   Influenza,inj,Quad PF,6+ Mos 06/21/2021   Influenza-Unspecified 07/05/2018, 04/29/2020, 05/25/2022   Moderna Sars-Covid-2 Vaccination 10/24/2019, 11/24/2019   Pneumococcal Conjugate-13 05/17/2022   Pneumococcal Polysaccharide-23 06/28/2014   Tdap 12/28/2014   Zoster Recombinat (Shingrix) 04/19/2020, 07/13/2020    Hypertension This is a chronic problem. The problem is controlled. Pertinent negatives include no chest pain, headaches, palpitations or shortness of breath. Past treatments include angiotensin blockers and beta blockers (losartan; coreg added a hospital stay). Hypertensive end-organ damage includes kidney disease and CAD/MI. There is no history of CVA.  Depression        This is a chronic problem.  Associated symptoms include fatigue.  Associated symptoms include no headaches.  Past treatments include SSRIs - Selective serotonin reuptake inhibitors (intended to taper meds after last visit). Diabetes Pertinent negatives for hypoglycemia include no dizziness, headaches, nervousness/anxiousness or tremors. Associated symptoms include fatigue. Pertinent negatives for diabetes  include no chest pain, no polydipsia and no polyuria. Pertinent negatives for diabetic complications include no CVA.  Hyperlipidemia This is a chronic problem. The problem is uncontrolled. Recent lipid tests were reviewed and are high. Pertinent negatives include no chest pain or shortness of breath. Current antihyperlipidemic treatment includes statins.  ESRD - on HD.  Trying to get on the transplant list.  Struggles to make appointments at time due to recurrent N/V.  Lab Results  Component Value Date   NA 137 03/28/2022   K 4.5 03/28/2022   CO2 27 (A) 12/11/2021   GLUCOSE 320 (H) 03/28/2022   BUN 18 03/28/2022   CREATININE 5.20 (H) 03/28/2022   CALCIUM 7.5 (A) 12/11/2021   EGFR 11 12/11/2021   GFRNONAA 11 (L) 12/06/2021   Lab Results  Component Value Date   CHOL 218 (A) 12/11/2021   HDL 109 (A) 12/11/2021   LDLCALC 84 12/11/2021   TRIG 126 12/11/2021   CHOLHDL 4.2 04/10/2021   Lab Results  Component Value Date   TSH 0.619 05/26/2021   Lab Results  Component Value Date   HGBA1C 8.3 04/04/2022   Lab Results  Component Value Date   WBC 6.7 12/11/2021   HGB 13.3 03/28/2022   HCT 39.0 03/28/2022   MCV 93.0 12/06/2021   PLT 260 12/11/2021   Lab Results  Component Value Date   ALT 14 12/11/2021   AST 20 12/11/2021   ALKPHOS 63 12/11/2021   BILITOT 0.8 12/06/2021   No results found for: "25OHVITD2", "25OHVITD3", "VD25OH"   Review of Systems  Constitutional:  Positive for fatigue. Negative for chills and fever.  HENT:  Negative for congestion, hearing loss, tinnitus, trouble swallowing and voice change.   Eyes:  Negative for visual disturbance.  Respiratory:  Negative for cough, chest tightness, shortness of breath and wheezing.  Cardiovascular:  Negative for chest pain, palpitations and leg swelling.  Gastrointestinal:  Positive for nausea and vomiting. Negative for abdominal pain, constipation and diarrhea.  Endocrine: Negative for polydipsia and polyuria.   Genitourinary:  Negative for dysuria, frequency, genital sores, vaginal bleeding and vaginal discharge.  Musculoskeletal:  Negative for arthralgias, gait problem and joint swelling.  Skin:  Negative for color change and rash.  Neurological:  Negative for dizziness, tremors, light-headedness and headaches.  Hematological:  Negative for adenopathy. Does not bruise/bleed easily.  Psychiatric/Behavioral:  Positive for depression and dysphoric mood. Negative for sleep disturbance. The patient is not nervous/anxious.     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   OSA (obstructive sleep apnea) 04/17/2022   Coronary artery disease involving native coronary artery of native heart 10/07/2021   S/P drug eluting coronary stent placement 10/07/2021   ESRD on peritoneal dialysis ([place]) 08/06/2021   Peptic ulcer disease 08/06/2021   Chest pain    Intractable cyclical vomiting with nausea 03/31/2021   Refusal of blood transfusions as patient is Jehovah's Witness 02/27/2021   Dysuria 01/28/2021   Myalgia due to statin 10/12/2020   Status post hysterectomy 10/02/2020   Psychophysiological insomnia 05/30/2020   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, left eye, 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   Neovascular glaucoma of left eye, 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   Hyperlipidemia due to type 1 diabetes mellitus ([place]) 06/30/2019   Hypomagnesemia 06/30/2019   Neuropathy of left foot 03/18/2017   Arthrosis of midfoot, left 03/18/2017   Charcot foot due to diabetes mellitus ([place]) 02/09/2017   Venous insufficiency of both lower extremities 02/09/2017   Chronic idiopathic constipation 01/26/2017   Abnormal thyroid blood test 09/30/2016   Essential hypertension 08/21/2016   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   Temporomandibular joint pain 11/02/2014   Moderate episode of recurrent major depressive disorder ([place]) 11/02/2014   DM type 1 with diabetic peripheral neuropathy  ([place]) 06/28/2014   Eustachian tube dysfunction 05/10/2014   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   Vitamin D deficiency 12/30/2012   Corns and callosity 07/09/2012   Hammer toe, acquired 07/09/2012    Allergies  Allergen Reactions   Methoxy Polyethylene Glycol-Epoetin Beta Anaphylaxis   Statins Nausea And Vomiting   Albumin Human Other (See Comments)    Refuses all blood products as one of Jehovah's Witnesses    Gabapentin Swelling   Hydrochlorothiazide Nausea And Vomiting   Lisinopril Other (See Comments)    fatigue   Lyrica [Pregabalin] Swelling   Metformin And Related Nausea And Vomiting   Penicillins Nausea And Vomiting    Tolerated CEFAZOLIN on 05/30/2021 and 12/06/2021 with no documented ADRs.    Past Surgical History:  Procedure Laterality Date   ABDOMINAL HYSTERECTOMY     AV FISTULA PLACEMENT Left 03/28/2022   Procedure: CREATION OF ARTERIOVENOUS (AV) FISTULA;  Surgeon: Katha Cabal, MD;  Location: [facility];  Service: Vascular;  Laterality: Left;   CAPD INSERTION N/A 05/30/2021   Procedure: CONTINUOUS AMBULATORY PERITONEAL DIALYSIS  (CAPD) CATHETER INSERTION;  Surgeon: Jules Husbands, MD;  Location: [facility];  Service: General;  Laterality: N/A;   CAPD REMOVAL N/A 12/06/2021   Procedure: REMOVAL OF CONTINUOUS AMBULATORY PERITONEAL DIALYSIS  (CAPD) CATHETER;  Surgeon: Jules Husbands, MD;  Location: [facility];  Service: General;  Laterality: N/A;   COLONOSCOPY W/ POLYPECTOMY     CORONARY ANGIOPLASTY WITH STENT PLACEMENT Left 10/07/2021   Procedure: CORONARY ANGIOPLASTY WITH STENT  PLACEMENT; Location: [facility]; Surgeon: Rymer, Anderson Malta, MD   DIALYSIS/PERMA CATHETER INSERTION N/A 05/13/2021   Procedure: DIALYSIS/PERMA CATHETER INSERTION;  Surgeon: Algernon Huxley, MD;  Location: [facility];  Service: Cardiovascular;  Laterality: N/A;   ESOPHAGOGASTRODUODENOSCOPY  03/02/2017   Normal   HEMORRHOID SURGERY      Social History   Tobacco Use   Smoking  status: Former    Passive exposure: Never   Smokeless tobacco: Never  Vaping Use   Vaping Use: Never used  Substance Use Topics   Alcohol use: No   Drug use: No     Medication list has been reviewed and updated.  No outpatient medications have been marked as taking for the 07/11/22 encounter (Appointment) with Glean Hess, MD.       06/03/2022    3:32 PM 04/18/2022    2:04 PM 08/06/2021    2:48 PM 04/10/2021    3:41 PM  GAD 7 : Generalized Anxiety Score  Nervous, Anxious, on Edge 0 0 0 0  Control/stop worrying 0 0 0 0  Worry too much - different things 0 0 0 0  Trouble relaxing 0 0 0 0  Restless 0 0 0 0  Easily annoyed or irritable 0 0 0 0  Afraid - awful might happen 0 0 0 0  Total GAD 7 Score 0 0 0 0  Anxiety Difficulty Not difficult at all Not difficult at all Not difficult at all Not difficult at all       06/03/2022    3:32 PM 04/18/2022    2:04 PM 08/06/2021    2:48 PM  Depression screen PHQ 2/9  Decreased Interest 0 0 0  Down, Depressed, Hopeless 0 0 0  PHQ - 2 Score 0 0 0  Altered sleeping 0 0 0  Tired, decreased energy 0 0 0  Change in appetite 0 0 0  Feeling bad or failure about yourself  0 0 0  Trouble concentrating 0 0 0  Moving slowly or fidgety/restless 0 0 0  Suicidal thoughts 0 0 0  PHQ-9 Score 0 0 0  Difficult doing work/chores Not difficult at all Not difficult at all Not difficult at all    BP Readings from Last 3 Encounters:  07/01/22 136/82  06/03/22 136/66  05/22/22 (!) 160/63    Physical Exam  Wt Readings from Last 3 Encounters:  07/01/22 150 lb (68 kg)  06/03/22 157 lb (71.2 kg)  05/22/22 150 lb (68 kg)    There were no vitals taken for this visit.  Assessment and Plan:

## 2022-07-12 DIAGNOSIS — Z992 Dependence on renal dialysis: Secondary | ICD-10-CM | POA: Diagnosis not present

## 2022-07-12 DIAGNOSIS — N2581 Secondary hyperparathyroidism of renal origin: Secondary | ICD-10-CM | POA: Diagnosis not present

## 2022-07-12 DIAGNOSIS — R110 Nausea: Secondary | ICD-10-CM | POA: Diagnosis not present

## 2022-07-12 DIAGNOSIS — N186 End stage renal disease: Secondary | ICD-10-CM | POA: Diagnosis not present

## 2022-07-12 DIAGNOSIS — I10 Essential (primary) hypertension: Secondary | ICD-10-CM | POA: Diagnosis not present

## 2022-07-14 DIAGNOSIS — N2581 Secondary hyperparathyroidism of renal origin: Secondary | ICD-10-CM | POA: Diagnosis not present

## 2022-07-14 DIAGNOSIS — I10 Essential (primary) hypertension: Secondary | ICD-10-CM | POA: Diagnosis not present

## 2022-07-14 DIAGNOSIS — N186 End stage renal disease: Secondary | ICD-10-CM | POA: Diagnosis not present

## 2022-07-14 DIAGNOSIS — Z992 Dependence on renal dialysis: Secondary | ICD-10-CM | POA: Diagnosis not present

## 2022-07-18 DIAGNOSIS — N186 End stage renal disease: Secondary | ICD-10-CM | POA: Diagnosis not present

## 2022-07-18 DIAGNOSIS — N2581 Secondary hyperparathyroidism of renal origin: Secondary | ICD-10-CM | POA: Diagnosis not present

## 2022-07-18 DIAGNOSIS — Z992 Dependence on renal dialysis: Secondary | ICD-10-CM | POA: Diagnosis not present

## 2022-07-18 DIAGNOSIS — I10 Essential (primary) hypertension: Secondary | ICD-10-CM | POA: Diagnosis not present

## 2022-07-19 DIAGNOSIS — N2581 Secondary hyperparathyroidism of renal origin: Secondary | ICD-10-CM | POA: Diagnosis not present

## 2022-07-19 DIAGNOSIS — I10 Essential (primary) hypertension: Secondary | ICD-10-CM | POA: Diagnosis not present

## 2022-07-19 DIAGNOSIS — N186 End stage renal disease: Secondary | ICD-10-CM | POA: Diagnosis not present

## 2022-07-19 DIAGNOSIS — Z992 Dependence on renal dialysis: Secondary | ICD-10-CM | POA: Diagnosis not present

## 2022-07-21 ENCOUNTER — Emergency Department
Admission: EM | Admit: 2022-07-21 | Discharge: 2022-07-21 | Disposition: A | Discharge status: Home / Self Care | Payer: BC Managed Care – PPO | Attending: Emergency Medicine | Admitting: Emergency Medicine

## 2022-07-21 ENCOUNTER — Other Ambulatory Visit: Payer: Self-pay

## 2022-07-21 ENCOUNTER — Emergency Department: Payer: BC Managed Care – PPO

## 2022-07-21 DIAGNOSIS — R7989 Other specified abnormal findings of blood chemistry: Secondary | ICD-10-CM | POA: Diagnosis not present

## 2022-07-21 DIAGNOSIS — Z992 Dependence on renal dialysis: Secondary | ICD-10-CM | POA: Insufficient documentation

## 2022-07-21 DIAGNOSIS — N186 End stage renal disease: Secondary | ICD-10-CM | POA: Insufficient documentation

## 2022-07-21 DIAGNOSIS — R112 Nausea with vomiting, unspecified: Secondary | ICD-10-CM | POA: Diagnosis not present

## 2022-07-21 DIAGNOSIS — J90 Pleural effusion, not elsewhere classified: Secondary | ICD-10-CM | POA: Diagnosis not present

## 2022-07-21 DIAGNOSIS — E1122 Type 2 diabetes mellitus with diabetic chronic kidney disease: Secondary | ICD-10-CM | POA: Insufficient documentation

## 2022-07-21 DIAGNOSIS — R1011 Right upper quadrant pain: Secondary | ICD-10-CM | POA: Diagnosis not present

## 2022-07-21 DIAGNOSIS — I120 Hypertensive chronic kidney disease with stage 5 chronic kidney disease or end stage renal disease: Secondary | ICD-10-CM | POA: Diagnosis not present

## 2022-07-21 DIAGNOSIS — K824 Cholesterolosis of gallbladder: Secondary | ICD-10-CM | POA: Diagnosis not present

## 2022-07-21 LAB — CBC
HCT: 28.6 % — ABNORMAL LOW (ref 36.0–46.0)
Hemoglobin: 9.3 g/dL — ABNORMAL LOW (ref 12.0–15.0)
MCH: 32.2 pg (ref 26.0–34.0)
MCHC: 32.5 g/dL (ref 30.0–36.0)
MCV: 99 fL (ref 80.0–100.0)
Platelets: 200 10*3/uL (ref 150–400)
RBC: 2.89 MIL/uL — ABNORMAL LOW (ref 3.87–5.11)
RDW: 14.6 % (ref 11.5–15.5)
WBC: 7 10*3/uL (ref 4.0–10.5)
nRBC: 0 % (ref 0.0–0.2)

## 2022-07-21 LAB — COMPREHENSIVE METABOLIC PANEL
ALT: 88 U/L — ABNORMAL HIGH (ref 0–44)
AST: 52 U/L — ABNORMAL HIGH (ref 15–41)
Albumin: 3.9 g/dL (ref 3.5–5.0)
Alkaline Phosphatase: 119 U/L (ref 38–126)
Anion gap: 13 (ref 5–15)
BUN: 52 mg/dL — ABNORMAL HIGH (ref 8–23)
CO2: 26 mmol/L (ref 22–32)
Calcium: 8.8 mg/dL — ABNORMAL LOW (ref 8.9–10.3)
Chloride: 98 mmol/L (ref 98–111)
Creatinine, Ser: 7.39 mg/dL — ABNORMAL HIGH (ref 0.44–1.00)
GFR, Estimated: 6 mL/min — ABNORMAL LOW (ref >60)
Glucose, Bld: 194 mg/dL — ABNORMAL HIGH (ref 70–99)
Potassium: 4.5 mmol/L (ref 3.5–5.1)
Sodium: 137 mmol/L (ref 135–145)
Total Bilirubin: 1 mg/dL (ref 0.3–1.2)
Total Protein: 6.7 g/dL (ref 6.5–8.1)

## 2022-07-21 LAB — TROPONIN I (HIGH SENSITIVITY)
Troponin I (High Sensitivity): 64 ng/L — ABNORMAL HIGH (ref <18)
Troponin I (High Sensitivity): 71 ng/L — ABNORMAL HIGH (ref <18)

## 2022-07-21 LAB — LIPASE, BLOOD: Lipase: 35 U/L (ref 11–51)

## 2022-07-21 LAB — CBG MONITORING, ED: Glucose-Capillary: 186 mg/dL — ABNORMAL HIGH (ref 70–99)

## 2022-07-21 MED ORDER — ONDANSETRON HCL 4 MG/2ML IJ SOLN
4.0000 mg | Freq: Once | INTRAMUSCULAR | Status: AC | PRN
  Administered 2022-07-21: 4 mg via INTRAVENOUS
  Filled 2022-07-21: qty 2

## 2022-07-21 MED ORDER — ONDANSETRON HCL 4 MG/2ML IJ SOLN
4.0000 mg | Freq: Once | INTRAMUSCULAR | Status: AC
  Administered 2022-07-21: 4 mg via INTRAVENOUS
  Filled 2022-07-21: qty 2

## 2022-07-21 NOTE — ED Provider Triage Note (Signed)
Emergency Medicine Provider Triage Evaluation Note  Kelli [name] , a 61 y.o. female  was evaluated in triage.  Diabetic pt complains of vomiting.  She has not been able to hold anything down for the past couple of months.  Husband states that she is basically surviving on Ensure.  She is a dialysis patient who has not missed dialysis but is scheduled for tomorrow.  Her glucose monitor read "high" last night.  Physical Exam  BP (!) 205/73 (BP Location: Right Arm)   Pulse 71   Temp 98.4 F (36.9 C) (Oral)   Resp 18   Ht '5\' 6"'$  (1.676 m)   Wt 68 kg   SpO2 93%   BMI 24.21 kg/m  Gen:   Awake, no distress   Resp:  Normal effort  MSK:   Moves extremities without difficulty  Other:    Medical Decision Making  Medically screening exam initiated at 2:57 PM.  Appropriate orders placed.  Kelli [name] was informed that the remainder of the evaluation will be completed by another provider, this initial triage assessment does not replace that evaluation, and the importance of remaining in the ED until their evaluation is complete.    Victorino Dike, FNP 07/21/22 [phone]

## 2022-07-21 NOTE — ED Notes (Signed)
Pt hypoxic on room air, denies SOB - notifie dprovider and placed on 2L

## 2022-07-21 NOTE — ED Triage Notes (Signed)
Patient is on dialysis and husband reports patient has been vomiting and not able to hold anything down. Z Reports her CBG machine was reading high last night.  Suppose to have dialysis tomorrow.  Reports this has been going on for months.

## 2022-07-21 NOTE — ED Provider Notes (Addendum)
[facility] Provider Note    Event Date/Time   First MD Initiated Contact with Patient 07/21/22 1807     (approximate)   History   Emesis and Hyperglycemia   HPI  Kelli [name] is a 61 y.o. female   Past medical history of end-stage renal disease on hemodialysis on Tuesday, Thursday, Saturday, diabetes, hypertension and hyperlipidemia, peptic ulcer disease, presents with abdominal discomfort, nausea and vomiting ongoing for 1 year.  Unchanged in quality duration, location.  No blood in the stools or emesis.  She denies fever.  Denies any change in the severity of pain.  She is prescribed Reglan which helps transiently with her symptoms. She has been compliant with her pantoprazole. She has a follow-up appointment scheduled for GI  She denies urinary symptoms.  She has not missed any dialysis sessions.  She denies chest pain, shortness of breath, cough or any other medical complaints.  History was obtained via the patient.  Her husband is present at bedside as an independent historian who is able to tell me her medications including Reglan and pantoprazole I reviewed external medical notes including a CT scan from 06/07/2022 with no bile duct dilatation and no gallstones noted.      Physical Exam   Triage Vital Signs: ED Triage Vitals  Enc Vitals Group     BP 07/21/22 1454 (!) 205/73     Pulse Rate 07/21/22 1454 71     Resp 07/21/22 1454 18     Temp 07/21/22 1454 98.4 F (36.9 C)     Temp Source 07/21/22 1454 Oral     SpO2 07/21/22 1454 93 %     Weight 07/21/22 1455 150 lb (68 kg)     Height 07/21/22 1455 '5\' 6"'$  (1.676 m)     Head Circumference --      Peak Flow --      Pain Score --      Pain Loc --      Pain Edu? --      Excl. in [place]? --     Most recent vital signs: Vitals:   07/21/22 1749 07/21/22 1852  BP: (!) 187/77 (!) 191/79  Pulse: 71 79  Resp: 18 20  Temp:  98.7 F (37.1 C)  SpO2: 94% 90%    General: Awake, no distress.   CV:  Good peripheral perfusion.  Resp:  Normal effort.  Abd:  No distention.  Soft and nontender. Other:  Hypertensive 180s over 70s.  Afebrile.  Overall comfortable appearing and nontoxic.  Appears clinically euvolemic.   ED Results / Procedures / Treatments   Labs (all labs ordered are listed, but only abnormal results are displayed) Labs Reviewed  COMPREHENSIVE METABOLIC PANEL - Abnormal; Notable for the following components:      Result Value   Glucose, Bld 194 (*)    BUN 52 (*)    Creatinine, Ser 7.39 (*)    Calcium 8.8 (*)    AST 52 (*)    ALT 88 (*)    GFR, Estimated 6 (*)    All other components within normal limits  CBC - Abnormal; Notable for the following components:   RBC 2.89 (*)    Hemoglobin 9.3 (*)    HCT 28.6 (*)    All other components within normal limits  CBG MONITORING, ED - Abnormal; Notable for the following components:   Glucose-Capillary 186 (*)    All other components within normal limits  TROPONIN I (HIGH SENSITIVITY) -  Abnormal; Notable for the following components:   Troponin I (High Sensitivity) 64 (*)    All other components within normal limits  TROPONIN I (HIGH SENSITIVITY) - Abnormal; Notable for the following components:   Troponin I (High Sensitivity) 71 (*)    All other components within normal limits  LIPASE, BLOOD  URINALYSIS, ROUTINE W REFLEX MICROSCOPIC     I reviewed labs and they are notable for mildly elevated liver enzymes, creatinine of 7.23, hemoglobin 9.3.  Potassium is normal.  EKG  ED ECG REPORT I, Lucillie Garfinkel, the attending physician, personally viewed and interpreted this ECG.   Date: 07/21/2022  EKG Time: 1500  Rate: 70  Rhythm: normal sinus rhythm  Axis: nl  Intervals:QTC 475  ST&T Change: No acute ischemic changes   PROCEDURES:  Critical Care performed: No  Procedures   MEDICATIONS ORDERED IN ED: Medications  ondansetron (ZOFRAN) injection 4 mg (4 mg Intravenous Given 07/21/22 1752)  ondansetron  (ZOFRAN) injection 4 mg (4 mg Intravenous Given 07/21/22 1956)     IMPRESSION / MDM / ASSESSMENT AND PLAN / ED COURSE  I reviewed the triage vital signs and the nursing notes.                              Differential diagnosis includes, but is not limited to, gastroparesis, DKA, electrolyte disturbance, dehydration, intra-abdominal infection like appendicitis or cholecystitis    MDM: Chronic symptoms unchanged from prior, with no fever or increase in severity of abdominal pain.  She takes Reglan and pantoprazole, I reinforced that she remain compliant with these medications.  I offered CT scan of the abdomen pelvis but since quality of pain is unchanged and she already had 2 CAT scans from last month which were negative for acute pathology, patient declines at this time.  Her LFTs were mildly elevated but she has nontender to the right upper quadrant and a review of prior CT scan shows no gallstones or biliary pathologies. Will check RUQ [place] today.   Has no urinary symptoms to suggest urinary tract infection.  Labs are within normal limits nothing to suggest DKA or electrolyte disturbance.  Abdomen exam is benign.  Given chronicity of symptoms, patient elected to defer imaging at this time and will continue to take her medications and follow-up with GI and return with any worsening symptoms; so long as US wnl.  Ultrasound shows a gallbladder polyp, early cirrhotic changes in the liver, I informed the patient of these findings.  Fortunately no acute surgical pathology in the gallbladder and she can be discharged at this time.  Patient's presentation is most consistent with acute presentation with potential threat to life or bodily function.       FINAL CLINICAL IMPRESSION(S) / ED DIAGNOSES   Final diagnoses:  Nausea and vomiting, unspecified vomiting type     Rx / DC Orders   ED Discharge Orders     None        Note:  This document was prepared using Dragon voice  recognition software and may include unintentional dictation errors.    Lucillie Garfinkel, MD 07/21/22 Merrily Pew    Lucillie Garfinkel, MD 07/21/22 Kristian Covey    Lucillie Garfinkel, MD 07/21/22 2025

## 2022-07-21 NOTE — ED Notes (Signed)
Kelli Grip, MD okay with patient discharging at current BP and patient instructed to take at home BP medications once she gets home.

## 2022-07-21 NOTE — Discharge Instructions (Addendum)
Continue to take your Reglan for nausea and vomiting as prescribed.  Continue to take your pantoprazole for stomach acid as prescribed.  Talk to your doctor about your abnormal liver findings including liver enzymes and evidence of liver dysfunction on your ultrasound.  There was also a polyp in your gallbladder that may need a recheck.  Fortunately there were no findings indicating an infection of your gallbladder that would require an emergency surgery at this time.  Follow-up with your gastroenterologist as scheduled for further evaluation and treatment of your nausea, vomiting and abdominal discomfort.  Thank you for choosing [place] for your health care today!  Please see your primary doctor this week for a follow up appointment.   If you do not have a primary doctor call the following clinics to establish care:  If you have insurance:  [facility] [phone] [place] [address]   [facility]  [phone] [address] [place] [address]   If you do not have insurance:  [facility]  [phone] [address], [place] [address]  Sometimes, in the early stages of certain disease courses it is difficult to detect in the emergency department evaluation -- so, it is important that you continue to monitor your symptoms and call your doctor right away or return to the emergency department if you develop any new or worsening symptoms.  It was my pleasure to care for you today.   Hoover Brunette Jacelyn Grip, MD

## 2022-07-21 NOTE — ED Notes (Signed)
Pt to [place]   Dr. Jacelyn Grip made aware of elevated BP

## 2022-07-21 NOTE — ED Notes (Signed)
Report to Alex, RN.

## 2022-07-22 ENCOUNTER — Telehealth: Payer: Self-pay | Admitting: Internal Medicine

## 2022-07-22 ENCOUNTER — Other Ambulatory Visit (INDEPENDENT_AMBULATORY_CARE_PROVIDER_SITE_OTHER): Payer: Self-pay | Admitting: Nurse Practitioner

## 2022-07-22 DIAGNOSIS — N2581 Secondary hyperparathyroidism of renal origin: Secondary | ICD-10-CM | POA: Diagnosis not present

## 2022-07-22 DIAGNOSIS — Z992 Dependence on renal dialysis: Secondary | ICD-10-CM | POA: Diagnosis not present

## 2022-07-22 DIAGNOSIS — Z23 Encounter for immunization: Secondary | ICD-10-CM | POA: Diagnosis not present

## 2022-07-22 DIAGNOSIS — N186 End stage renal disease: Secondary | ICD-10-CM

## 2022-07-22 DIAGNOSIS — R110 Nausea: Secondary | ICD-10-CM | POA: Diagnosis not present

## 2022-07-22 NOTE — Telephone Encounter (Signed)
Copied from [place] [id number]. Topic: General - Other >> Jul 22, 2022  2:18 PM Kelli [name] wrote: Reason for CRM: The patient has called to request orders for imaging of their left wrist  Please contact the patient further if/wen possible

## 2022-07-22 NOTE — Telephone Encounter (Signed)
Medication Refill - Medication: Reglan for nausea  '5mg'$   Has the patient contacted their pharmacy? Yes.  Pt said she did (Agent: If no, request that the patient contact the pharmacy for the refill. If patient does not wish to contact the pharmacy document the reason why and proceed with request.) (Agent: If yes, when and what did the pharmacy advise?)  Preferred Pharmacy (with phone number or street name): [facility] Has the patient been seen for an appointment in the last year OR does the patient have an upcoming appointment? Yes.    Agent: Please be advised that RX refills may take up to 3 business days. We ask that you follow-up with your pharmacy. Marland Kitchen

## 2022-07-23 ENCOUNTER — Telehealth (INDEPENDENT_AMBULATORY_CARE_PROVIDER_SITE_OTHER): Payer: Self-pay | Admitting: Vascular Surgery

## 2022-07-23 ENCOUNTER — Ambulatory Visit (INDEPENDENT_AMBULATORY_CARE_PROVIDER_SITE_OTHER): Payer: BC Managed Care – PPO

## 2022-07-23 DIAGNOSIS — N186 End stage renal disease: Secondary | ICD-10-CM

## 2022-07-23 NOTE — Telephone Encounter (Signed)
Patient called, left VM to return the call to the office for a message from Dr. Army Melia about Reglan refill.

## 2022-07-23 NOTE — Telephone Encounter (Signed)
Pt sees GI. This medication has never been prescribed by Dr. Carolin Coy.  KP

## 2022-07-23 NOTE — Telephone Encounter (Signed)
Patient LVM stating she has dialysis tomorrow and not feeling well today.  She wants to know if she can get the results over the phone.  Please advise.

## 2022-07-23 NOTE — Telephone Encounter (Signed)
Called pt left VM to call back.  Pt needs appt.  KP

## 2022-07-23 NOTE — Telephone Encounter (Signed)
I spoke with Arna Medici NP and she recommended for the patient to come in the office to received ultrasound results. I left a detailed message on patient voicemail.

## 2022-07-24 DIAGNOSIS — N186 End stage renal disease: Secondary | ICD-10-CM | POA: Diagnosis not present

## 2022-07-24 DIAGNOSIS — Z23 Encounter for immunization: Secondary | ICD-10-CM | POA: Diagnosis not present

## 2022-07-24 DIAGNOSIS — Z992 Dependence on renal dialysis: Secondary | ICD-10-CM | POA: Diagnosis not present

## 2022-07-24 DIAGNOSIS — N2581 Secondary hyperparathyroidism of renal origin: Secondary | ICD-10-CM | POA: Diagnosis not present

## 2022-07-24 DIAGNOSIS — R110 Nausea: Secondary | ICD-10-CM | POA: Diagnosis not present

## 2022-07-24 NOTE — Telephone Encounter (Signed)
Called, left VM about in regards to medication refill for Reglan. Patient has ROI on file.

## 2022-07-25 ENCOUNTER — Ambulatory Visit
Admission: RE | Admit: 2022-07-25 | Discharge: 2022-07-25 | Disposition: A | Discharge status: Home / Self Care | Payer: BC Managed Care – PPO | Source: Ambulatory Visit | Attending: Internal Medicine | Admitting: Internal Medicine

## 2022-07-25 ENCOUNTER — Ambulatory Visit (INDEPENDENT_AMBULATORY_CARE_PROVIDER_SITE_OTHER): Payer: BC Managed Care – PPO | Admitting: Vascular Surgery

## 2022-07-25 ENCOUNTER — Ambulatory Visit (INDEPENDENT_AMBULATORY_CARE_PROVIDER_SITE_OTHER): Payer: BC Managed Care – PPO | Admitting: Internal Medicine

## 2022-07-25 ENCOUNTER — Encounter: Payer: Self-pay | Admitting: Internal Medicine

## 2022-07-25 ENCOUNTER — Ambulatory Visit
Admission: RE | Admit: 2022-07-25 | Discharge: 2022-07-25 | Disposition: A | Discharge status: Home / Self Care | Payer: BC Managed Care – PPO | Attending: Internal Medicine | Admitting: Internal Medicine

## 2022-07-25 VITALS — BP 178/70 | HR 70 | Ht 66.0 in | Wt 161.6 lb

## 2022-07-25 DIAGNOSIS — M25532 Pain in left wrist: Secondary | ICD-10-CM | POA: Diagnosis not present

## 2022-07-25 DIAGNOSIS — S62102A Fracture of unspecified carpal bone, left wrist, initial encounter for closed fracture: Secondary | ICD-10-CM | POA: Diagnosis not present

## 2022-07-25 DIAGNOSIS — R112 Nausea with vomiting, unspecified: Secondary | ICD-10-CM

## 2022-07-25 MED ORDER — PROMETHAZINE HCL 12.5 MG PO TABS: 12.5000 mg | ORAL_TABLET | Freq: Three times a day (TID) | ORAL | 0 refills | Status: DC | PRN
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
## 2022-07-26 DIAGNOSIS — N2581 Secondary hyperparathyroidism of renal origin: Secondary | ICD-10-CM | POA: Diagnosis not present

## 2022-07-26 DIAGNOSIS — N186 End stage renal disease: Secondary | ICD-10-CM | POA: Diagnosis not present

## 2022-07-26 DIAGNOSIS — Z992 Dependence on renal dialysis: Secondary | ICD-10-CM | POA: Diagnosis not present

## 2022-07-29 DIAGNOSIS — Z992 Dependence on renal dialysis: Secondary | ICD-10-CM | POA: Diagnosis not present

## 2022-07-29 DIAGNOSIS — I10 Essential (primary) hypertension: Secondary | ICD-10-CM | POA: Diagnosis not present

## 2022-07-29 DIAGNOSIS — R110 Nausea: Secondary | ICD-10-CM | POA: Diagnosis not present

## 2022-07-29 DIAGNOSIS — N2581 Secondary hyperparathyroidism of renal origin: Secondary | ICD-10-CM | POA: Diagnosis not present

## 2022-07-29 DIAGNOSIS — N186 End stage renal disease: Secondary | ICD-10-CM | POA: Diagnosis not present

## 2022-08-01 DIAGNOSIS — N2581 Secondary hyperparathyroidism of renal origin: Secondary | ICD-10-CM | POA: Diagnosis not present

## 2022-08-01 DIAGNOSIS — I10 Essential (primary) hypertension: Secondary | ICD-10-CM | POA: Diagnosis not present

## 2022-08-01 DIAGNOSIS — R110 Nausea: Secondary | ICD-10-CM | POA: Diagnosis not present

## 2022-08-01 DIAGNOSIS — Z992 Dependence on renal dialysis: Secondary | ICD-10-CM | POA: Diagnosis not present

## 2022-08-01 DIAGNOSIS — N186 End stage renal disease: Secondary | ICD-10-CM | POA: Diagnosis not present

## 2022-08-02 DIAGNOSIS — Z992 Dependence on renal dialysis: Secondary | ICD-10-CM | POA: Diagnosis not present

## 2022-08-02 DIAGNOSIS — N2581 Secondary hyperparathyroidism of renal origin: Secondary | ICD-10-CM | POA: Diagnosis not present

## 2022-08-02 DIAGNOSIS — I10 Essential (primary) hypertension: Secondary | ICD-10-CM | POA: Diagnosis not present

## 2022-08-02 DIAGNOSIS — N186 End stage renal disease: Secondary | ICD-10-CM | POA: Diagnosis not present

## 2022-08-02 DIAGNOSIS — R110 Nausea: Secondary | ICD-10-CM | POA: Diagnosis not present

## 2022-08-05 DIAGNOSIS — Z992 Dependence on renal dialysis: Secondary | ICD-10-CM | POA: Diagnosis not present

## 2022-08-05 DIAGNOSIS — R110 Nausea: Secondary | ICD-10-CM | POA: Diagnosis not present

## 2022-08-05 DIAGNOSIS — N2581 Secondary hyperparathyroidism of renal origin: Secondary | ICD-10-CM | POA: Diagnosis not present

## 2022-08-05 DIAGNOSIS — N186 End stage renal disease: Secondary | ICD-10-CM | POA: Diagnosis not present

## 2022-08-07 DIAGNOSIS — N186 End stage renal disease: Secondary | ICD-10-CM | POA: Diagnosis not present

## 2022-08-07 DIAGNOSIS — R110 Nausea: Secondary | ICD-10-CM | POA: Diagnosis not present

## 2022-08-07 DIAGNOSIS — Z992 Dependence on renal dialysis: Secondary | ICD-10-CM | POA: Diagnosis not present

## 2022-08-07 DIAGNOSIS — N2581 Secondary hyperparathyroidism of renal origin: Secondary | ICD-10-CM | POA: Diagnosis not present

## 2022-08-08 DIAGNOSIS — Z452 Encounter for adjustment and management of vascular access device: Secondary | ICD-10-CM | POA: Diagnosis not present

## 2022-08-08 DIAGNOSIS — N186 End stage renal disease: Secondary | ICD-10-CM | POA: Diagnosis not present

## 2022-08-08 DIAGNOSIS — Z992 Dependence on renal dialysis: Secondary | ICD-10-CM | POA: Diagnosis not present

## 2022-08-09 DIAGNOSIS — Z992 Dependence on renal dialysis: Secondary | ICD-10-CM | POA: Diagnosis not present

## 2022-08-09 DIAGNOSIS — N2581 Secondary hyperparathyroidism of renal origin: Secondary | ICD-10-CM | POA: Diagnosis not present

## 2022-08-09 DIAGNOSIS — R110 Nausea: Secondary | ICD-10-CM | POA: Diagnosis not present

## 2022-08-09 DIAGNOSIS — N186 End stage renal disease: Secondary | ICD-10-CM | POA: Diagnosis not present

## 2022-08-12 DIAGNOSIS — N186 End stage renal disease: Secondary | ICD-10-CM | POA: Diagnosis not present

## 2022-08-12 DIAGNOSIS — Z992 Dependence on renal dialysis: Secondary | ICD-10-CM | POA: Diagnosis not present

## 2022-08-12 DIAGNOSIS — R110 Nausea: Secondary | ICD-10-CM | POA: Diagnosis not present

## 2022-08-12 DIAGNOSIS — N2581 Secondary hyperparathyroidism of renal origin: Secondary | ICD-10-CM | POA: Diagnosis not present

## 2022-08-14 DIAGNOSIS — Z992 Dependence on renal dialysis: Secondary | ICD-10-CM | POA: Diagnosis not present

## 2022-08-14 DIAGNOSIS — R110 Nausea: Secondary | ICD-10-CM | POA: Diagnosis not present

## 2022-08-14 DIAGNOSIS — N2581 Secondary hyperparathyroidism of renal origin: Secondary | ICD-10-CM | POA: Diagnosis not present

## 2022-08-14 DIAGNOSIS — N186 End stage renal disease: Secondary | ICD-10-CM | POA: Diagnosis not present

## 2022-08-16 DIAGNOSIS — N186 End stage renal disease: Secondary | ICD-10-CM | POA: Diagnosis not present

## 2022-08-16 DIAGNOSIS — N2581 Secondary hyperparathyroidism of renal origin: Secondary | ICD-10-CM | POA: Diagnosis not present

## 2022-08-16 DIAGNOSIS — R110 Nausea: Secondary | ICD-10-CM | POA: Diagnosis not present

## 2022-08-16 DIAGNOSIS — Z992 Dependence on renal dialysis: Secondary | ICD-10-CM | POA: Diagnosis not present

## 2022-08-19 DIAGNOSIS — I10 Essential (primary) hypertension: Secondary | ICD-10-CM | POA: Diagnosis not present

## 2022-08-19 DIAGNOSIS — Z992 Dependence on renal dialysis: Secondary | ICD-10-CM | POA: Diagnosis not present

## 2022-08-19 DIAGNOSIS — N2581 Secondary hyperparathyroidism of renal origin: Secondary | ICD-10-CM | POA: Diagnosis not present

## 2022-08-19 DIAGNOSIS — N186 End stage renal disease: Secondary | ICD-10-CM | POA: Diagnosis not present

## 2022-08-21 ENCOUNTER — Other Ambulatory Visit: Payer: Self-pay | Admitting: Gastroenterology

## 2022-08-21 DIAGNOSIS — N186 End stage renal disease: Secondary | ICD-10-CM | POA: Diagnosis not present

## 2022-08-21 DIAGNOSIS — Z992 Dependence on renal dialysis: Secondary | ICD-10-CM | POA: Diagnosis not present

## 2022-08-21 DIAGNOSIS — N2581 Secondary hyperparathyroidism of renal origin: Secondary | ICD-10-CM | POA: Diagnosis not present

## 2022-08-21 DIAGNOSIS — I10 Essential (primary) hypertension: Secondary | ICD-10-CM | POA: Diagnosis not present

## 2022-08-23 DIAGNOSIS — Z992 Dependence on renal dialysis: Secondary | ICD-10-CM | POA: Diagnosis not present

## 2022-08-23 DIAGNOSIS — I10 Essential (primary) hypertension: Secondary | ICD-10-CM | POA: Diagnosis not present

## 2022-08-23 DIAGNOSIS — N2581 Secondary hyperparathyroidism of renal origin: Secondary | ICD-10-CM | POA: Diagnosis not present

## 2022-08-23 DIAGNOSIS — N186 End stage renal disease: Secondary | ICD-10-CM | POA: Diagnosis not present

## 2022-08-24 DIAGNOSIS — N186 End stage renal disease: Secondary | ICD-10-CM | POA: Diagnosis not present

## 2022-08-24 DIAGNOSIS — Z992 Dependence on renal dialysis: Secondary | ICD-10-CM | POA: Diagnosis not present

## 2022-08-26 DIAGNOSIS — R110 Nausea: Secondary | ICD-10-CM | POA: Diagnosis not present

## 2022-08-26 DIAGNOSIS — I10 Essential (primary) hypertension: Secondary | ICD-10-CM | POA: Diagnosis not present

## 2022-08-26 DIAGNOSIS — N186 End stage renal disease: Secondary | ICD-10-CM | POA: Diagnosis not present

## 2022-08-26 DIAGNOSIS — Z992 Dependence on renal dialysis: Secondary | ICD-10-CM | POA: Diagnosis not present

## 2022-08-26 DIAGNOSIS — N2581 Secondary hyperparathyroidism of renal origin: Secondary | ICD-10-CM | POA: Diagnosis not present

## 2022-08-28 DIAGNOSIS — N186 End stage renal disease: Secondary | ICD-10-CM | POA: Diagnosis not present

## 2022-08-28 DIAGNOSIS — Z992 Dependence on renal dialysis: Secondary | ICD-10-CM | POA: Diagnosis not present

## 2022-08-28 DIAGNOSIS — N2581 Secondary hyperparathyroidism of renal origin: Secondary | ICD-10-CM | POA: Diagnosis not present

## 2022-08-28 DIAGNOSIS — I10 Essential (primary) hypertension: Secondary | ICD-10-CM | POA: Diagnosis not present

## 2022-08-28 DIAGNOSIS — R110 Nausea: Secondary | ICD-10-CM | POA: Diagnosis not present

## 2022-08-30 DIAGNOSIS — I10 Essential (primary) hypertension: Secondary | ICD-10-CM | POA: Diagnosis not present

## 2022-08-30 DIAGNOSIS — Z992 Dependence on renal dialysis: Secondary | ICD-10-CM | POA: Diagnosis not present

## 2022-08-30 DIAGNOSIS — N2581 Secondary hyperparathyroidism of renal origin: Secondary | ICD-10-CM | POA: Diagnosis not present

## 2022-08-30 DIAGNOSIS — N186 End stage renal disease: Secondary | ICD-10-CM | POA: Diagnosis not present

## 2022-08-30 DIAGNOSIS — R110 Nausea: Secondary | ICD-10-CM | POA: Diagnosis not present

## 2022-09-01 DIAGNOSIS — N2581 Secondary hyperparathyroidism of renal origin: Secondary | ICD-10-CM | POA: Diagnosis not present

## 2022-09-01 DIAGNOSIS — Z992 Dependence on renal dialysis: Secondary | ICD-10-CM | POA: Diagnosis not present

## 2022-09-01 DIAGNOSIS — E139 Other specified diabetes mellitus without complications: Secondary | ICD-10-CM | POA: Diagnosis not present

## 2022-09-01 DIAGNOSIS — I10 Essential (primary) hypertension: Secondary | ICD-10-CM | POA: Diagnosis not present

## 2022-09-01 DIAGNOSIS — R110 Nausea: Secondary | ICD-10-CM | POA: Diagnosis not present

## 2022-09-01 DIAGNOSIS — N186 End stage renal disease: Secondary | ICD-10-CM | POA: Diagnosis not present

## 2022-09-01 DIAGNOSIS — Z01818 Encounter for other preprocedural examination: Secondary | ICD-10-CM | POA: Diagnosis not present

## 2022-09-01 DIAGNOSIS — K5904 Chronic idiopathic constipation: Secondary | ICD-10-CM | POA: Diagnosis not present

## 2022-09-02 DIAGNOSIS — Z531 Procedure and treatment not carried out because of patient's decision for reasons of belief and group pressure: Secondary | ICD-10-CM | POA: Diagnosis not present

## 2022-09-02 DIAGNOSIS — Z789 Other specified health status: Secondary | ICD-10-CM | POA: Diagnosis not present

## 2022-09-02 DIAGNOSIS — N186 End stage renal disease: Secondary | ICD-10-CM | POA: Diagnosis not present

## 2022-09-02 DIAGNOSIS — Z86010 Personal history of colonic polyps: Secondary | ICD-10-CM | POA: Diagnosis not present

## 2022-09-02 DIAGNOSIS — I120 Hypertensive chronic kidney disease with stage 5 chronic kidney disease or end stage renal disease: Secondary | ICD-10-CM | POA: Diagnosis not present

## 2022-09-02 DIAGNOSIS — I132 Hypertensive heart and chronic kidney disease with heart failure and with stage 5 chronic kidney disease, or end stage renal disease: Secondary | ICD-10-CM | POA: Diagnosis not present

## 2022-09-02 DIAGNOSIS — E1342 Other specified diabetes mellitus with diabetic polyneuropathy: Secondary | ICD-10-CM | POA: Diagnosis not present

## 2022-09-02 DIAGNOSIS — K2100 Gastro-esophageal reflux disease with esophagitis, without bleeding: Secondary | ICD-10-CM | POA: Diagnosis not present

## 2022-09-02 DIAGNOSIS — K2210 Ulcer of esophagus without bleeding: Secondary | ICD-10-CM | POA: Diagnosis not present

## 2022-09-02 DIAGNOSIS — D631 Anemia in chronic kidney disease: Secondary | ICD-10-CM | POA: Diagnosis not present

## 2022-09-02 DIAGNOSIS — G43909 Migraine, unspecified, not intractable, without status migrainosus: Secondary | ICD-10-CM | POA: Diagnosis not present

## 2022-09-02 DIAGNOSIS — Z1211 Encounter for screening for malignant neoplasm of colon: Secondary | ICD-10-CM | POA: Diagnosis not present

## 2022-09-02 DIAGNOSIS — Z955 Presence of coronary angioplasty implant and graft: Secondary | ICD-10-CM | POA: Diagnosis not present

## 2022-09-02 DIAGNOSIS — I2510 Atherosclerotic heart disease of native coronary artery without angina pectoris: Secondary | ICD-10-CM | POA: Diagnosis not present

## 2022-09-02 DIAGNOSIS — G4730 Sleep apnea, unspecified: Secondary | ICD-10-CM | POA: Diagnosis not present

## 2022-09-02 DIAGNOSIS — E13319 Other specified diabetes mellitus with unspecified diabetic retinopathy without macular edema: Secondary | ICD-10-CM | POA: Diagnosis not present

## 2022-09-02 DIAGNOSIS — K5904 Chronic idiopathic constipation: Secondary | ICD-10-CM | POA: Diagnosis not present

## 2022-09-02 DIAGNOSIS — E1322 Other specified diabetes mellitus with diabetic chronic kidney disease: Secondary | ICD-10-CM | POA: Diagnosis not present

## 2022-09-02 DIAGNOSIS — F418 Other specified anxiety disorders: Secondary | ICD-10-CM | POA: Diagnosis not present

## 2022-09-02 DIAGNOSIS — I5020 Unspecified systolic (congestive) heart failure: Secondary | ICD-10-CM | POA: Diagnosis not present

## 2022-09-04 DIAGNOSIS — I10 Essential (primary) hypertension: Secondary | ICD-10-CM | POA: Diagnosis not present

## 2022-09-04 DIAGNOSIS — R110 Nausea: Secondary | ICD-10-CM | POA: Diagnosis not present

## 2022-09-04 DIAGNOSIS — Z992 Dependence on renal dialysis: Secondary | ICD-10-CM | POA: Diagnosis not present

## 2022-09-04 DIAGNOSIS — N2581 Secondary hyperparathyroidism of renal origin: Secondary | ICD-10-CM | POA: Diagnosis not present

## 2022-09-04 DIAGNOSIS — N186 End stage renal disease: Secondary | ICD-10-CM | POA: Diagnosis not present

## 2022-09-06 DIAGNOSIS — R110 Nausea: Secondary | ICD-10-CM | POA: Diagnosis not present

## 2022-09-06 DIAGNOSIS — N2581 Secondary hyperparathyroidism of renal origin: Secondary | ICD-10-CM | POA: Diagnosis not present

## 2022-09-06 DIAGNOSIS — Z992 Dependence on renal dialysis: Secondary | ICD-10-CM | POA: Diagnosis not present

## 2022-09-06 DIAGNOSIS — N186 End stage renal disease: Secondary | ICD-10-CM | POA: Diagnosis not present

## 2022-09-06 DIAGNOSIS — I10 Essential (primary) hypertension: Secondary | ICD-10-CM | POA: Diagnosis not present

## 2022-09-09 DIAGNOSIS — I10 Essential (primary) hypertension: Secondary | ICD-10-CM | POA: Diagnosis not present

## 2022-09-09 DIAGNOSIS — R110 Nausea: Secondary | ICD-10-CM | POA: Diagnosis not present

## 2022-09-09 DIAGNOSIS — Z992 Dependence on renal dialysis: Secondary | ICD-10-CM | POA: Diagnosis not present

## 2022-09-09 DIAGNOSIS — N186 End stage renal disease: Secondary | ICD-10-CM | POA: Diagnosis not present

## 2022-09-09 DIAGNOSIS — N2581 Secondary hyperparathyroidism of renal origin: Secondary | ICD-10-CM | POA: Diagnosis not present

## 2022-09-10 ENCOUNTER — Telehealth: Payer: Self-pay | Admitting: Internal Medicine

## 2022-09-10 NOTE — Telephone Encounter (Signed)
Copied from [place] [phone]. Topic: General - Other >> Sep 10, 2022  9:09 AM Cyndi Bender wrote: Reason for CRM: Pt spouse called to ask if Dr. Army Melia could do a thyroid test or if pt will need to go to another location. Pt spouse requests call back at [phone]

## 2022-09-10 NOTE — Telephone Encounter (Signed)
Copied from [place] [id number]. Topic: General - Other >> Sep 10, 2022  9:09 AM Cyndi Bender wrote: Reason for CRM: Pt spouse called to ask if Dr. Army Melia could do a thyroid test or if pt will need to go to another location. Pt spouse requests call back at [phone]

## 2022-09-10 NOTE — Telephone Encounter (Signed)
Patient will need appointment before we can order labs. Please call to schedule appointment.  Kelli [name]

## 2022-09-11 DIAGNOSIS — R110 Nausea: Secondary | ICD-10-CM | POA: Diagnosis not present

## 2022-09-11 DIAGNOSIS — I10 Essential (primary) hypertension: Secondary | ICD-10-CM | POA: Diagnosis not present

## 2022-09-11 DIAGNOSIS — N2581 Secondary hyperparathyroidism of renal origin: Secondary | ICD-10-CM | POA: Diagnosis not present

## 2022-09-11 DIAGNOSIS — N186 End stage renal disease: Secondary | ICD-10-CM | POA: Diagnosis not present

## 2022-09-11 DIAGNOSIS — Z992 Dependence on renal dialysis: Secondary | ICD-10-CM | POA: Diagnosis not present

## 2022-09-13 DIAGNOSIS — Z992 Dependence on renal dialysis: Secondary | ICD-10-CM | POA: Diagnosis not present

## 2022-09-13 DIAGNOSIS — I10 Essential (primary) hypertension: Secondary | ICD-10-CM | POA: Diagnosis not present

## 2022-09-13 DIAGNOSIS — N2581 Secondary hyperparathyroidism of renal origin: Secondary | ICD-10-CM | POA: Diagnosis not present

## 2022-09-13 DIAGNOSIS — R110 Nausea: Secondary | ICD-10-CM | POA: Diagnosis not present

## 2022-09-13 DIAGNOSIS — N186 End stage renal disease: Secondary | ICD-10-CM | POA: Diagnosis not present

## 2022-09-15 ENCOUNTER — Ambulatory Visit: Payer: BC Managed Care – PPO | Admitting: Internal Medicine

## 2022-09-16 DIAGNOSIS — Z992 Dependence on renal dialysis: Secondary | ICD-10-CM | POA: Diagnosis not present

## 2022-09-16 DIAGNOSIS — N186 End stage renal disease: Secondary | ICD-10-CM | POA: Diagnosis not present

## 2022-09-16 DIAGNOSIS — N2581 Secondary hyperparathyroidism of renal origin: Secondary | ICD-10-CM | POA: Diagnosis not present

## 2022-09-16 DIAGNOSIS — R110 Nausea: Secondary | ICD-10-CM | POA: Diagnosis not present

## 2022-09-19 ENCOUNTER — Ambulatory Visit: Payer: Self-pay | Admitting: *Deleted

## 2022-09-19 DIAGNOSIS — Z992 Dependence on renal dialysis: Secondary | ICD-10-CM | POA: Diagnosis not present

## 2022-09-19 DIAGNOSIS — R110 Nausea: Secondary | ICD-10-CM | POA: Diagnosis not present

## 2022-09-19 DIAGNOSIS — N186 End stage renal disease: Secondary | ICD-10-CM | POA: Diagnosis not present

## 2022-09-19 DIAGNOSIS — N2581 Secondary hyperparathyroidism of renal origin: Secondary | ICD-10-CM | POA: Diagnosis not present

## 2022-09-19 NOTE — Telephone Encounter (Signed)
Summary: nausea and vomiting   Patient last seen PCP on 07/26/2023 and states nausea and vomiting.did not improve. Patient would like PCP to order a TSH.         Chief Complaint: chronic nausea- vomiting at times Symptoms: wakes with nausea Frequency: ongoing over 1 year Pertinent Negatives: Patient denies  Disposition: '[]'$ ED /'[]'$ Urgent Care (no appt availability in office) / '[]'$ Appointment(In office/virtual)/ '[]'$  [place] [facility]/ '[]'$ Home Care/ '[x]'$ Refused Recommended Disposition /'[]'$ [place] [facility]/ '[]'$  Follow-up with PCP Additional Notes: Patient states this is a chronic problem and she has been trying to find out why she is having these symptoms. Patient states her husband was doing research and he read that thyroid disorder can cause these symptoms. Patient would like order for test. Declines an appointment- she states she was told she did not have to have an appointment to get this order. Patient advised per office note- needs appointment- but she does not want to schedule. Please call her to let her know if PCP will order this test  Reason for Disposition  Nausea is a chronic symptom (recurrent or ongoing AND present > 4 weeks)  Answer Assessment - Initial Assessment Questions 1. NAUSEA SEVERITY: "How bad is the nausea?" (e.g., mild, moderate, severe; dehydration, weight loss)   - MILD: loss of appetite without change in eating habits   - MODERATE: decreased oral intake without significant weight loss, dehydration, or malnutrition   - SEVERE: inadequate caloric or fluid intake, significant weight loss, symptoms of dehydration     Mild- nausea- vomits at times 2. ONSET: "When did the nausea begin?"     Over 1 year 3. VOMITING: "Any vomiting?" If Yes, ask: "How many times today?"     Occasional vomiting 4. RECURRENT SYMPTOM: "Have you had nausea before?" If Yes, ask: "When was the last time?" "What happened that time?"     chronic 5. CAUSE: "What do you think is causing the  nausea?"     Unsure- patient has been tested- not sure cause  Protocols used: Nausea-A-AH

## 2022-09-19 NOTE — Telephone Encounter (Signed)
Please call pt to schedule an appt. Pt needs to be seen.  KP

## 2022-09-20 DIAGNOSIS — Z992 Dependence on renal dialysis: Secondary | ICD-10-CM | POA: Diagnosis not present

## 2022-09-20 DIAGNOSIS — N2581 Secondary hyperparathyroidism of renal origin: Secondary | ICD-10-CM | POA: Diagnosis not present

## 2022-09-20 DIAGNOSIS — N186 End stage renal disease: Secondary | ICD-10-CM | POA: Diagnosis not present

## 2022-09-20 DIAGNOSIS — R110 Nausea: Secondary | ICD-10-CM | POA: Diagnosis not present

## 2022-09-23 DIAGNOSIS — N186 End stage renal disease: Secondary | ICD-10-CM | POA: Diagnosis not present

## 2022-09-23 DIAGNOSIS — Z992 Dependence on renal dialysis: Secondary | ICD-10-CM | POA: Diagnosis not present

## 2022-09-23 DIAGNOSIS — N2581 Secondary hyperparathyroidism of renal origin: Secondary | ICD-10-CM | POA: Diagnosis not present

## 2022-09-24 DIAGNOSIS — Z992 Dependence on renal dialysis: Secondary | ICD-10-CM | POA: Diagnosis not present

## 2022-09-24 DIAGNOSIS — N186 End stage renal disease: Secondary | ICD-10-CM | POA: Diagnosis not present

## 2022-09-25 DIAGNOSIS — N186 End stage renal disease: Secondary | ICD-10-CM | POA: Diagnosis not present

## 2022-09-25 DIAGNOSIS — N2581 Secondary hyperparathyroidism of renal origin: Secondary | ICD-10-CM | POA: Diagnosis not present

## 2022-09-25 DIAGNOSIS — R110 Nausea: Secondary | ICD-10-CM | POA: Diagnosis not present

## 2022-09-25 DIAGNOSIS — I10 Essential (primary) hypertension: Secondary | ICD-10-CM | POA: Diagnosis not present

## 2022-09-25 DIAGNOSIS — Z992 Dependence on renal dialysis: Secondary | ICD-10-CM | POA: Diagnosis not present

## 2022-09-27 DIAGNOSIS — N2581 Secondary hyperparathyroidism of renal origin: Secondary | ICD-10-CM | POA: Diagnosis not present

## 2022-09-27 DIAGNOSIS — Z992 Dependence on renal dialysis: Secondary | ICD-10-CM | POA: Diagnosis not present

## 2022-09-27 DIAGNOSIS — R110 Nausea: Secondary | ICD-10-CM | POA: Diagnosis not present

## 2022-09-27 DIAGNOSIS — I10 Essential (primary) hypertension: Secondary | ICD-10-CM | POA: Diagnosis not present

## 2022-09-27 DIAGNOSIS — N186 End stage renal disease: Secondary | ICD-10-CM | POA: Diagnosis not present

## 2022-10-02 DIAGNOSIS — I10 Essential (primary) hypertension: Secondary | ICD-10-CM | POA: Diagnosis not present

## 2022-10-02 DIAGNOSIS — N186 End stage renal disease: Secondary | ICD-10-CM | POA: Diagnosis not present

## 2022-10-02 DIAGNOSIS — N2581 Secondary hyperparathyroidism of renal origin: Secondary | ICD-10-CM | POA: Diagnosis not present

## 2022-10-02 DIAGNOSIS — Z992 Dependence on renal dialysis: Secondary | ICD-10-CM | POA: Diagnosis not present

## 2022-10-04 DIAGNOSIS — Z992 Dependence on renal dialysis: Secondary | ICD-10-CM | POA: Diagnosis not present

## 2022-10-04 DIAGNOSIS — I10 Essential (primary) hypertension: Secondary | ICD-10-CM | POA: Diagnosis not present

## 2022-10-04 DIAGNOSIS — N186 End stage renal disease: Secondary | ICD-10-CM | POA: Diagnosis not present

## 2022-10-04 DIAGNOSIS — N2581 Secondary hyperparathyroidism of renal origin: Secondary | ICD-10-CM | POA: Diagnosis not present

## 2022-10-07 DIAGNOSIS — Z992 Dependence on renal dialysis: Secondary | ICD-10-CM | POA: Diagnosis not present

## 2022-10-07 DIAGNOSIS — N186 End stage renal disease: Secondary | ICD-10-CM | POA: Diagnosis not present

## 2022-10-07 DIAGNOSIS — R110 Nausea: Secondary | ICD-10-CM | POA: Diagnosis not present

## 2022-10-07 DIAGNOSIS — E113511 Type 2 diabetes mellitus with proliferative diabetic retinopathy with macular edema, right eye: Secondary | ICD-10-CM | POA: Diagnosis not present

## 2022-10-07 DIAGNOSIS — I10 Essential (primary) hypertension: Secondary | ICD-10-CM | POA: Diagnosis not present

## 2022-10-07 DIAGNOSIS — E113513 Type 2 diabetes mellitus with proliferative diabetic retinopathy with macular edema, bilateral: Secondary | ICD-10-CM | POA: Diagnosis not present

## 2022-10-07 DIAGNOSIS — N2581 Secondary hyperparathyroidism of renal origin: Secondary | ICD-10-CM | POA: Diagnosis not present

## 2022-10-09 DIAGNOSIS — R110 Nausea: Secondary | ICD-10-CM | POA: Diagnosis not present

## 2022-10-09 DIAGNOSIS — N2581 Secondary hyperparathyroidism of renal origin: Secondary | ICD-10-CM | POA: Diagnosis not present

## 2022-10-09 DIAGNOSIS — I10 Essential (primary) hypertension: Secondary | ICD-10-CM | POA: Diagnosis not present

## 2022-10-09 DIAGNOSIS — Z992 Dependence on renal dialysis: Secondary | ICD-10-CM | POA: Diagnosis not present

## 2022-10-09 DIAGNOSIS — N186 End stage renal disease: Secondary | ICD-10-CM | POA: Diagnosis not present

## 2022-10-11 DIAGNOSIS — N186 End stage renal disease: Secondary | ICD-10-CM | POA: Diagnosis not present

## 2022-10-11 DIAGNOSIS — R110 Nausea: Secondary | ICD-10-CM | POA: Diagnosis not present

## 2022-10-11 DIAGNOSIS — N2581 Secondary hyperparathyroidism of renal origin: Secondary | ICD-10-CM | POA: Diagnosis not present

## 2022-10-11 DIAGNOSIS — Z992 Dependence on renal dialysis: Secondary | ICD-10-CM | POA: Diagnosis not present

## 2022-10-11 DIAGNOSIS — I10 Essential (primary) hypertension: Secondary | ICD-10-CM | POA: Diagnosis not present

## 2022-10-13 ENCOUNTER — Telehealth: Payer: Self-pay

## 2022-10-13 ENCOUNTER — Encounter: Payer: Self-pay | Admitting: Internal Medicine

## 2022-10-13 NOTE — Telephone Encounter (Signed)
Pt is calling to receive any updat on the request for a thryoid test. Please advise [phone]- [phone]

## 2022-10-13 NOTE — Telephone Encounter (Signed)
Patient overdue for Diabetes follow up. Please call her to schedule diabetes follow up and Thyroid check.  Thank you!! [employer]

## 2022-10-13 NOTE — Telephone Encounter (Signed)
Sent to front desk to schedule follow up diabetes, and Thyroid.

## 2022-10-14 DIAGNOSIS — Z992 Dependence on renal dialysis: Secondary | ICD-10-CM | POA: Diagnosis not present

## 2022-10-14 DIAGNOSIS — N2581 Secondary hyperparathyroidism of renal origin: Secondary | ICD-10-CM | POA: Diagnosis not present

## 2022-10-14 DIAGNOSIS — I10 Essential (primary) hypertension: Secondary | ICD-10-CM | POA: Diagnosis not present

## 2022-10-14 DIAGNOSIS — R110 Nausea: Secondary | ICD-10-CM | POA: Diagnosis not present

## 2022-10-14 DIAGNOSIS — N186 End stage renal disease: Secondary | ICD-10-CM | POA: Diagnosis not present

## 2022-10-16 ENCOUNTER — Other Ambulatory Visit: Payer: Self-pay | Admitting: Internal Medicine

## 2022-10-16 ENCOUNTER — Other Ambulatory Visit: Payer: Self-pay | Admitting: Gastroenterology

## 2022-10-16 DIAGNOSIS — N186 End stage renal disease: Secondary | ICD-10-CM | POA: Diagnosis not present

## 2022-10-16 DIAGNOSIS — N2581 Secondary hyperparathyroidism of renal origin: Secondary | ICD-10-CM | POA: Diagnosis not present

## 2022-10-16 DIAGNOSIS — Z992 Dependence on renal dialysis: Secondary | ICD-10-CM | POA: Diagnosis not present

## 2022-10-16 DIAGNOSIS — R110 Nausea: Secondary | ICD-10-CM | POA: Diagnosis not present

## 2022-10-16 DIAGNOSIS — I10 Essential (primary) hypertension: Secondary | ICD-10-CM | POA: Diagnosis not present

## 2022-10-16 DIAGNOSIS — F331 Major depressive disorder, recurrent, moderate: Secondary | ICD-10-CM

## 2022-10-16 NOTE — Telephone Encounter (Signed)
Requested Prescriptions  Refused Prescriptions Disp Refills   escitalopram (LEXAPRO) 5 MG tablet [Pharmacy Med Name: ESCITALOPRAM 5 MG TABLET] 90 tablet 1    Sig: TAKE 1 TABLET (5 MG TOTAL) BY MOUTH DAILY.     Psychiatry:  Antidepressants - SSRI Passed - 10/16/2022  3:21 PM      Passed - Completed PHQ-2 or PHQ-9 in the last 360 days      Passed - Valid encounter within last 6 months    Recent Outpatient Visits           2 months ago Avulsion fracture of left wrist   [facility], Jesse Sans, MD   3 months ago Peptic ulcer disease   [facility], Jesse Sans, MD   4 months ago Encounter for gynecological examination   [place] at [facility], Jesse Sans, MD   6 months ago Essential hypertension   [place] [facility], Jesse Sans, MD   1 year ago Sleep disorder breathing   [facility] [place] [facility], Jesse Sans, MD       Future Appointments             In 1 week Army Melia, Jesse Sans, MD [place] at [facility]

## 2022-10-18 DIAGNOSIS — Z992 Dependence on renal dialysis: Secondary | ICD-10-CM | POA: Diagnosis not present

## 2022-10-18 DIAGNOSIS — N186 End stage renal disease: Secondary | ICD-10-CM | POA: Diagnosis not present

## 2022-10-18 DIAGNOSIS — R110 Nausea: Secondary | ICD-10-CM | POA: Diagnosis not present

## 2022-10-18 DIAGNOSIS — N2581 Secondary hyperparathyroidism of renal origin: Secondary | ICD-10-CM | POA: Diagnosis not present

## 2022-10-18 DIAGNOSIS — I10 Essential (primary) hypertension: Secondary | ICD-10-CM | POA: Diagnosis not present

## 2022-10-21 ENCOUNTER — Other Ambulatory Visit: Payer: Self-pay

## 2022-10-21 ENCOUNTER — Telehealth: Payer: Self-pay

## 2022-10-21 ENCOUNTER — Ambulatory Visit: Payer: Self-pay

## 2022-10-21 DIAGNOSIS — R110 Nausea: Secondary | ICD-10-CM | POA: Diagnosis not present

## 2022-10-21 DIAGNOSIS — I10 Essential (primary) hypertension: Secondary | ICD-10-CM | POA: Diagnosis not present

## 2022-10-21 DIAGNOSIS — N2581 Secondary hyperparathyroidism of renal origin: Secondary | ICD-10-CM | POA: Diagnosis not present

## 2022-10-21 DIAGNOSIS — Z992 Dependence on renal dialysis: Secondary | ICD-10-CM | POA: Diagnosis not present

## 2022-10-21 DIAGNOSIS — N186 End stage renal disease: Secondary | ICD-10-CM | POA: Diagnosis not present

## 2022-10-21 MED ORDER — FAMOTIDINE 20 MG PO TABS: ORAL_TABLET | ORAL | 1 refills | Status: DC

## 2022-10-21 NOTE — Telephone Encounter (Signed)
Call from pt's husband requesting refill of famotidine

## 2022-10-21 NOTE — Telephone Encounter (Signed)
Husband call back to inform us that he has tried to contact nephrology, without success. Husband will keep trying.  Also requesting a refill of famotidine.

## 2022-10-21 NOTE — Telephone Encounter (Signed)
  Chief Complaint: nausea Symptoms: nausea, dry heaves weakness, fatigue Frequency: ongoing 1 year but occurs several hrs after dialysis Pertinent Negatives: Patient denies vomiting  Disposition: []$ ED /[]$ Urgent Care (no appt availability in office) / []$ Appointment(In office/virtual)/ []$  [place] [facility]/ []$ Home Care/ []$ Refused Recommended Disposition /[]$ [place] [facility]/ []$  Follow-up with PCP Additional Notes: pt's husband Jenny Reichmann calling in to report pt's sx and not getting any better after dialysis. Pt had HD on Saturday and pt has been sick since then. Pt takes the phenergan but doesn't help with nausea. Husband is concerned since pt just staying sick all the time. Advised him to call Nephro provider Dr. Holley Raring and see if they can give any recommendations or see pt in office. Husband provided with office # and will reach out and if no assistance will call back to FU with PCP.  Reason for Disposition  Nausea is a chronic symptom (recurrent or ongoing AND present > 4 weeks)  Answer Assessment - Initial Assessment Questions 1. NAUSEA SEVERITY: "How bad is the nausea?" (e.g., mild, moderate, severe; dehydration, weight loss)   - MILD: loss of appetite without change in eating habits   - MODERATE: decreased oral intake without significant weight loss, dehydration, or malnutrition   - SEVERE: inadequate caloric or fluid intake, significant weight loss, symptoms of dehydration     Moderate  2. ONSET: "When did the nausea begin?"     1 year after dialysis  3. VOMITING: "Any vomiting?" If Yes, ask: "How many times today?"     No having dry heaves  4. RECURRENT SYMPTOM: "Have you had nausea before?" If Yes, ask: "When was the last time?" "What happened that time?"     Saturday  5. CAUSE: "What do you think is causing the nausea?"     Causes after dialysis  Protocols used: Nausea-A-AH

## 2022-10-21 NOTE — Telephone Encounter (Signed)
Refill sent in

## 2022-10-23 DIAGNOSIS — Z992 Dependence on renal dialysis: Secondary | ICD-10-CM | POA: Diagnosis not present

## 2022-10-23 DIAGNOSIS — N186 End stage renal disease: Secondary | ICD-10-CM | POA: Diagnosis not present

## 2022-10-23 DIAGNOSIS — I10 Essential (primary) hypertension: Secondary | ICD-10-CM | POA: Diagnosis not present

## 2022-10-23 DIAGNOSIS — R110 Nausea: Secondary | ICD-10-CM | POA: Diagnosis not present

## 2022-10-23 DIAGNOSIS — N2581 Secondary hyperparathyroidism of renal origin: Secondary | ICD-10-CM | POA: Diagnosis not present

## 2022-10-24 ENCOUNTER — Ambulatory Visit (INDEPENDENT_AMBULATORY_CARE_PROVIDER_SITE_OTHER): Payer: BC Managed Care – PPO | Admitting: Internal Medicine

## 2022-10-24 ENCOUNTER — Encounter: Payer: Self-pay | Admitting: Internal Medicine

## 2022-10-24 VITALS — BP 132/70 | HR 76 | Ht 66.0 in | Wt 157.0 lb

## 2022-10-24 DIAGNOSIS — I10 Essential (primary) hypertension: Secondary | ICD-10-CM | POA: Diagnosis not present

## 2022-10-24 DIAGNOSIS — F331 Major depressive disorder, recurrent, moderate: Secondary | ICD-10-CM | POA: Diagnosis not present

## 2022-10-24 DIAGNOSIS — E1042 Type 1 diabetes mellitus with diabetic polyneuropathy: Secondary | ICD-10-CM

## 2022-10-24 DIAGNOSIS — E785 Hyperlipidemia, unspecified: Secondary | ICD-10-CM

## 2022-10-24 DIAGNOSIS — E1069 Type 1 diabetes mellitus with other specified complication: Secondary | ICD-10-CM

## 2022-10-24 DIAGNOSIS — N186 End stage renal disease: Secondary | ICD-10-CM

## 2022-10-24 MED ORDER — ESCITALOPRAM OXALATE 5 MG PO TABS: 5.0000 mg | ORAL_TABLET | Freq: Every day | ORAL | 1 refills | Status: DC

## 2022-10-24 NOTE — Assessment & Plan Note (Signed)
BP remains high on coreg, catapres and losartan On HD for ESRD TuThS

## 2022-10-24 NOTE — Assessment & Plan Note (Signed)
Tolerating statin medications without concerns on Crestor LDL is  Lab Results  Component Value Date   LDLCALC 84 12/11/2021   with a goal of < 70. Current dose will be adjusted if needed.

## 2022-10-24 NOTE — Assessment & Plan Note (Signed)
Clinically stable on current regimen with good control of symptoms, No SI or HI. No change in management at this time with Lexapro

## 2022-10-24 NOTE — Assessment & Plan Note (Addendum)
Seeing Endo but no A1C since 03/2022 Continue Lantus and Humalog

## 2022-10-24 NOTE — Progress Notes (Signed)
Date:  10/24/2022   Name:  Kelli [name]   DOB:  02-Jun-1961   MRN:  [id number]   Chief Complaint: Diabetes  Diabetes She presents for her follow-up diabetic visit. She has type 1 diabetes mellitus. Pertinent negatives for hypoglycemia include no dizziness or headaches. Pertinent negatives for diabetes include no chest pain, no fatigue and no weakness.  Depression        This is a chronic problem.The problem is unchanged.  Associated symptoms include no fatigue and no headaches. Urinary Tract Infection  Chronicity: wonders if UTI is the cause of her nausea.  Hyperlipidemia This is a chronic problem. The problem is controlled. Pertinent negatives include no chest pain or shortness of breath. Current antihyperlipidemic treatment includes statins. The current treatment provides significant improvement of lipids.    Lab Results  Component Value Date   NA 137 07/21/2022   K 4.5 07/21/2022   CO2 26 07/21/2022   GLUCOSE 194 (H) 07/21/2022   BUN 52 (H) 07/21/2022   CREATININE 7.39 (H) 07/21/2022   CALCIUM 8.8 (L) 07/21/2022   EGFR 11 12/11/2021   GFRNONAA 6 (L) 07/21/2022   Lab Results  Component Value Date   CHOL 218 (A) 12/11/2021   HDL 109 (A) 12/11/2021   LDLCALC 84 12/11/2021   TRIG 126 12/11/2021   CHOLHDL 4.2 04/10/2021   Lab Results  Component Value Date   TSH 0.619 05/26/2021   Lab Results  Component Value Date   HGBA1C 8.3 04/04/2022   Lab Results  Component Value Date   WBC 7.0 07/21/2022   HGB 9.3 (L) 07/21/2022   HCT 28.6 (L) 07/21/2022   MCV 99.0 07/21/2022   PLT 200 07/21/2022   Lab Results  Component Value Date   ALT 88 (H) 07/21/2022   AST 52 (H) 07/21/2022   ALKPHOS 119 07/21/2022   BILITOT 1.0 07/21/2022   No results found for: "25OHVITD2", "25OHVITD3", "VD25OH"   Review of Systems  Constitutional:  Negative for fatigue and unexpected weight change.  HENT:  Negative for nosebleeds.   Eyes:  Negative for visual disturbance.  Respiratory:   Negative for cough, chest tightness, shortness of breath and wheezing.   Cardiovascular:  Negative for chest pain, palpitations and leg swelling.  Gastrointestinal:  Negative for abdominal pain, constipation and diarrhea.       Daily gagging with vomiting almost every day.  Neurological:  Negative for dizziness, weakness, light-headedness and headaches.  Psychiatric/Behavioral:  Positive for depression.     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, left eye, 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   Neovascular glaucoma of left eye, 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   Hyperlipidemia due to type 1 diabetes mellitus ([place]) 06/30/2019   Hypomagnesemia 06/30/2019   Charcot foot due to diabetes mellitus ([place]) 02/09/2017   Venous insufficiency of both lower extremities 02/09/2017   Chronic idiopathic constipation 01/26/2017   Essential hypertension 08/21/2016   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   Temporomandibular joint  pain 11/02/2014   Moderate episode of recurrent major depressive disorder ([place]) 11/02/2014   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[place]) 06/28/2014   Eustachian tube dysfunction 05/10/2014   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   Vitamin D deficiency 12/30/2012   Corns and callosity 07/09/2012   Hammer toe, acquired 07/09/2012    Allergies  Allergen Reactions   Methoxy Polyethylene Glycol-Epoetin Beta  Anaphylaxis   Statins Nausea And Vomiting   Albumin Human Other (See Comments)    Refuses all blood products as one of Jehovah's Witnesses    Gabapentin Swelling   Hydrochlorothiazide Nausea And Vomiting   Lisinopril Other (See Comments)    fatigue   Lyrica [Pregabalin] Swelling   Metformin And Related Nausea And Vomiting   Penicillins Nausea And Vomiting    Tolerated CEFAZOLIN on 05/30/2021 and 12/06/2021 with no documented ADRs.    Past Surgical History:  Procedure Laterality Date   ABDOMINAL HYSTERECTOMY     AV FISTULA PLACEMENT Left 03/28/2022   Procedure: CREATION OF ARTERIOVENOUS (AV) FISTULA;  Surgeon: Katha Cabal, MD;  Location: [facility];  Service: Vascular;  Laterality: Left;   CAPD INSERTION N/A 05/30/2021   Procedure: CONTINUOUS AMBULATORY PERITONEAL DIALYSIS  (CAPD) CATHETER INSERTION;  Surgeon: Jules Husbands, MD;  Location: [facility];  Service: General;  Laterality: N/A;   CAPD REMOVAL N/A 12/06/2021   Procedure: REMOVAL OF CONTINUOUS AMBULATORY PERITONEAL DIALYSIS  (CAPD) CATHETER;  Surgeon: Jules Husbands, MD;  Location: [facility];  Service: General;  Laterality: N/A;   COLONOSCOPY W/ POLYPECTOMY     CORONARY ANGIOPLASTY WITH STENT PLACEMENT Left 10/07/2021   Procedure: CORONARY ANGIOPLASTY WITH STENT PLACEMENT; Location: [facility]; Surgeon: Weyman Pedro, MD   DIALYSIS/PERMA CATHETER INSERTION N/A 05/13/2021   Procedure: DIALYSIS/PERMA CATHETER INSERTION;  Surgeon: Algernon Huxley, MD;  Location: [facility];  Service: Cardiovascular;  Laterality: N/A;   ESOPHAGOGASTRODUODENOSCOPY  03/02/2017   Normal   HEMORRHOID SURGERY      Social History   Tobacco Use   Smoking status: Former    Passive exposure: Never   Smokeless tobacco: Never  Vaping Use   Vaping Use: Never used  Substance Use Topics   Alcohol use: No   Drug use: No     Medication list has been reviewed and updated.  Current Meds  Medication Sig   aspirin EC 81 MG tablet Take 81 mg by  mouth daily. Swallow whole.   BD PEN NEEDLE NANO U/F 32G X 4 MM MISC Inject 1 each into the skin 3 (three) times daily.   brimonidine (ALPHAGAN) 0.2 % ophthalmic solution Place 1 drop into the left eye 2 (two) times daily.   carvedilol (COREG) 25 MG tablet Take 12.5 mg by mouth 2 (two) times daily.   cloNIDine (CATAPRES) 0.1 MG tablet Take 0.1 mg by mouth 3 (three) times daily.   clopidogrel (PLAVIX) 75 MG tablet Take 75 mg by mouth daily.   Continuous Blood Gluc Receiver (FREESTYLE LIBRE 2 READER) DEVI USE AS DIRECTED   Continuous Blood Gluc Sensor (FREESTYLE LIBRE 2 SENSOR) MISC USE AS DIRECTED EVERY 14 DAYS   dorzolamide-timolol (COSOPT) 22.3-6.8 MG/ML ophthalmic solution SMARTSIG:1 Drop(s) Left Eye Every 12 Hours   famotidine (PEPCID) 20 MG tablet TAKE 1 TABLET BY MOUTH EVERY DAY IN THE EVENING   glucose blood (FREESTYLE LITE) test strip 1 each by Other route five (5) times a day.   insulin glargine (LANTUS SOLOSTAR) 100 UNIT/ML Solostar Pen Inject 16 Units into  the skin daily. morning   insulin lispro (HUMALOG) 100 UNIT/ML injection Inject 5 Units into the skin 3 (three) times daily with meals.   latanoprost (XALATAN) 0.005 % ophthalmic solution Place 1 drop into the left eye at bedtime.   losartan (COZAAR) 100 MG tablet Take by mouth.   metoCLOPramide (REGLAN) 5 MG tablet Take 5 mg by mouth 4 (four) times daily.   nitroGLYCERIN (NITROSTAT) 0.4 MG SL tablet Place under the tongue.   promethazine (PHENERGAN) 12.5 MG tablet Take 1 tablet (12.5 mg total) by mouth every 8 (eight) hours as needed for nausea or vomiting.   RABEprazole (ACIPHEX) 20 MG tablet Take 20 mg by mouth daily.   rosuvastatin (CRESTOR) 10 MG tablet Take 10 mg by mouth daily.   [DISCONTINUED] escitalopram (LEXAPRO) 5 MG tablet TAKE 1 TABLET (5 MG TOTAL) BY MOUTH DAILY.       10/24/2022   11:12 AM 07/25/2022   11:33 AM 06/03/2022    3:32 PM 04/18/2022    2:04 PM  GAD 7 : Generalized Anxiety Score  Nervous, Anxious, on  Edge 0 2 0 0  Control/stop worrying 0 1 0 0  Worry too much - different things 0 1 0 0  Trouble relaxing 0 1 0 0  Restless 0 1 0 0  Easily annoyed or irritable 0 1 0 0  Afraid - awful might happen 0 0 0 0  Total GAD 7 Score 0 7 0 0  Anxiety Difficulty Not difficult at all Somewhat difficult Not difficult at all Not difficult at all       10/24/2022   11:12 AM 07/25/2022   11:33 AM 06/03/2022    3:32 PM  Depression screen PHQ 2/9  Decreased Interest 0 0 0  Down, Depressed, Hopeless 0 0 0  PHQ - 2 Score 0 0 0  Altered sleeping 0 0 0  Tired, decreased energy 0 3 0  Change in appetite 0 1 0  Feeling bad or failure about yourself  0 1 0  Trouble concentrating 0 1 0  Moving slowly or fidgety/restless 0 1 0  Suicidal thoughts 0 0 0  PHQ-9 Score 0 7 0  Difficult doing work/chores Not difficult at all Somewhat difficult Not difficult at all    BP Readings from Last 3 Encounters:  10/24/22 132/70  07/25/22 (!) 178/70  07/21/22 (!) 218/82    Physical Exam Vitals and nursing note reviewed.  Constitutional:      General: She is not in acute distress.    Appearance: Normal appearance. She is well-developed.  HENT:     Head: Normocephalic and atraumatic.  Cardiovascular:     Rate and Rhythm: Normal rate and regular rhythm.  Pulmonary:     Effort: Pulmonary effort is normal. No respiratory distress.     Breath sounds: No wheezing or rhonchi.  Abdominal:     General: Abdomen is flat.     Tenderness: There is no abdominal tenderness.  Musculoskeletal:        General: No swelling.     Cervical back: Normal range of motion.     Comments: Charcot foot changes bilaterally   Lymphadenopathy:     Cervical: No cervical adenopathy.  Skin:    General: Skin is warm and dry.     Findings: No rash.  Neurological:     General: No focal deficit present.     Mental Status: She is alert and oriented to person, place, and time.  Psychiatric:  Mood and Affect: Mood normal.         Behavior: Behavior normal.    Diabetic Foot Exam - Simple   Simple Foot Form Diabetic Foot exam was performed with the following findings: Yes 10/24/2022 10:05 AM  Visual Inspection See comments: Yes Sensation Testing Intact to touch and monofilament testing bilaterally: Yes Pulse Check Posterior Tibialis and Dorsalis pulse intact bilaterally: Yes Comments Charcot foot changes bilaterally Pulses intact Decreased sensation distally Thickened nails      Wt Readings from Last 3 Encounters:  10/24/22 157 lb (71.2 kg)  07/25/22 161 lb 9.6 oz (73.3 kg)  07/21/22 150 lb (68 kg)    BP 132/70 (BP Location: Right Arm, Cuff Size: Normal)   Pulse 76   Ht '5\' 6"'$  (1.676 m)   Wt 157 lb (71.2 kg)   SpO2 93%   BMI 25.34 kg/m   Assessment and Plan: Problem List Items Addressed This Visit       Cardiovascular and Mediastinum   Essential hypertension (Chronic)    BP remains high on coreg, catapres and losartan On HD for ESRD [facility]      Relevant Medications   cloNIDine (CATAPRES) 0.1 MG tablet   Other Relevant Orders   TSH + free T4     Endocrine   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HCC) - Primary (Chronic)    Seeing Endo but no A1C since 03/2022 Continue Lantus and Humalog       Relevant Medications   escitalopram (LEXAPRO) 5 MG tablet   Other Relevant Orders   Hemoglobin A1c   Hyperlipidemia due to type 1 diabetes mellitus (HCC) (Chronic)    Tolerating statin medications without concerns on Crestor LDL is  Lab Results  Component Value Date   LDLCALC 84 12/11/2021  with a goal of < 70. Current dose will be adjusted if needed.       Relevant Medications   cloNIDine (CATAPRES) 0.1 MG tablet   Other Relevant Orders   Lipid panel     Genitourinary   ESRD (end stage renal disease) ([place])    In HD [facility] Still trying to get back on the transplant list - was removed after missing too many appointments due to n/v        Other   Moderate episode of  recurrent major depressive disorder (HCC) (Chronic)    Clinically stable on current regimen with good control of symptoms, No SI or HI. No change in management at this time with Lexapro       Relevant Medications   escitalopram (LEXAPRO) 5 MG tablet     Partially dictated using Editor, commissioning. Any errors are unintentional.  Halina Maidens, MD [facility]  10/24/2022

## 2022-10-24 NOTE — Assessment & Plan Note (Signed)
In HD [facility] Still trying to get back on the transplant list - was removed after missing too many appointments due to n/v

## 2022-10-25 DIAGNOSIS — N186 End stage renal disease: Secondary | ICD-10-CM | POA: Diagnosis not present

## 2022-10-25 DIAGNOSIS — N2581 Secondary hyperparathyroidism of renal origin: Secondary | ICD-10-CM | POA: Diagnosis not present

## 2022-10-25 DIAGNOSIS — Z992 Dependence on renal dialysis: Secondary | ICD-10-CM | POA: Diagnosis not present

## 2022-10-25 LAB — LIPID PANEL
Chol/HDL Ratio: 2 ratio (ref 0.0–4.4)
Cholesterol, Total: 217 mg/dL — ABNORMAL HIGH (ref 100–199)
HDL: 109 mg/dL (ref >39)
LDL Chol Calc (NIH): 90 mg/dL (ref 0–99)
Triglycerides: 106 mg/dL (ref 0–149)
VLDL Cholesterol Cal: 18 mg/dL (ref 5–40)

## 2022-10-25 LAB — HEMOGLOBIN A1C
Est. average glucose Bld gHb Est-mCnc: 266 mg/dL
Hgb A1c MFr Bld: 10.9 % — ABNORMAL HIGH (ref 4.8–5.6)

## 2022-10-25 LAB — TSH+FREE T4
Free T4: 1.47 ng/dL (ref 0.82–1.77)
TSH: 0.96 u[IU]/mL (ref 0.450–4.500)

## 2022-10-28 DIAGNOSIS — N2581 Secondary hyperparathyroidism of renal origin: Secondary | ICD-10-CM | POA: Diagnosis not present

## 2022-10-28 DIAGNOSIS — R110 Nausea: Secondary | ICD-10-CM | POA: Diagnosis not present

## 2022-10-28 DIAGNOSIS — N186 End stage renal disease: Secondary | ICD-10-CM | POA: Diagnosis not present

## 2022-10-28 DIAGNOSIS — I10 Essential (primary) hypertension: Secondary | ICD-10-CM | POA: Diagnosis not present

## 2022-10-28 DIAGNOSIS — Z992 Dependence on renal dialysis: Secondary | ICD-10-CM | POA: Diagnosis not present

## 2022-10-30 DIAGNOSIS — N2581 Secondary hyperparathyroidism of renal origin: Secondary | ICD-10-CM | POA: Diagnosis not present

## 2022-10-30 DIAGNOSIS — N186 End stage renal disease: Secondary | ICD-10-CM | POA: Diagnosis not present

## 2022-10-30 DIAGNOSIS — R110 Nausea: Secondary | ICD-10-CM | POA: Diagnosis not present

## 2022-10-30 DIAGNOSIS — I10 Essential (primary) hypertension: Secondary | ICD-10-CM | POA: Diagnosis not present

## 2022-10-30 DIAGNOSIS — Z992 Dependence on renal dialysis: Secondary | ICD-10-CM | POA: Diagnosis not present

## 2022-11-01 DIAGNOSIS — N186 End stage renal disease: Secondary | ICD-10-CM | POA: Diagnosis not present

## 2022-11-01 DIAGNOSIS — I10 Essential (primary) hypertension: Secondary | ICD-10-CM | POA: Diagnosis not present

## 2022-11-01 DIAGNOSIS — R110 Nausea: Secondary | ICD-10-CM | POA: Diagnosis not present

## 2022-11-01 DIAGNOSIS — N2581 Secondary hyperparathyroidism of renal origin: Secondary | ICD-10-CM | POA: Diagnosis not present

## 2022-11-01 DIAGNOSIS — Z992 Dependence on renal dialysis: Secondary | ICD-10-CM | POA: Diagnosis not present

## 2022-11-03 DIAGNOSIS — K828 Other specified diseases of gallbladder: Secondary | ICD-10-CM | POA: Diagnosis not present

## 2022-11-03 DIAGNOSIS — R7989 Other specified abnormal findings of blood chemistry: Secondary | ICD-10-CM | POA: Diagnosis not present

## 2022-11-03 DIAGNOSIS — Z992 Dependence on renal dialysis: Secondary | ICD-10-CM | POA: Diagnosis not present

## 2022-11-03 DIAGNOSIS — Z7189 Other specified counseling: Secondary | ICD-10-CM | POA: Diagnosis not present

## 2022-11-03 DIAGNOSIS — K668 Other specified disorders of peritoneum: Secondary | ICD-10-CM | POA: Diagnosis not present

## 2022-11-03 DIAGNOSIS — Y33XXXA Other specified events, undetermined intent, initial encounter: Secondary | ICD-10-CM | POA: Diagnosis not present

## 2022-11-03 DIAGNOSIS — E11319 Type 2 diabetes mellitus with unspecified diabetic retinopathy without macular edema: Secondary | ICD-10-CM | POA: Diagnosis not present

## 2022-11-03 DIAGNOSIS — E1165 Type 2 diabetes mellitus with hyperglycemia: Secondary | ICD-10-CM | POA: Diagnosis not present

## 2022-11-03 DIAGNOSIS — J9601 Acute respiratory failure with hypoxia: Secondary | ICD-10-CM | POA: Diagnosis not present

## 2022-11-03 DIAGNOSIS — J811 Chronic pulmonary edema: Secondary | ICD-10-CM | POA: Diagnosis not present

## 2022-11-03 DIAGNOSIS — J90 Pleural effusion, not elsewhere classified: Secondary | ICD-10-CM | POA: Diagnosis not present

## 2022-11-03 DIAGNOSIS — Z515 Encounter for palliative care: Secondary | ICD-10-CM | POA: Diagnosis not present

## 2022-11-03 DIAGNOSIS — M96A2 Fracture of one rib associated with chest compression and cardiopulmonary resuscitation: Secondary | ICD-10-CM | POA: Diagnosis not present

## 2022-11-03 DIAGNOSIS — R569 Unspecified convulsions: Secondary | ICD-10-CM | POA: Diagnosis not present

## 2022-11-03 DIAGNOSIS — E1101 Type 2 diabetes mellitus with hyperosmolarity with coma: Secondary | ICD-10-CM | POA: Diagnosis not present

## 2022-11-03 DIAGNOSIS — S2232XA Fracture of one rib, left side, initial encounter for closed fracture: Secondary | ICD-10-CM | POA: Diagnosis not present

## 2022-11-03 DIAGNOSIS — I120 Hypertensive chronic kidney disease with stage 5 chronic kidney disease or end stage renal disease: Secondary | ICD-10-CM | POA: Diagnosis not present

## 2022-11-03 DIAGNOSIS — E1122 Type 2 diabetes mellitus with diabetic chronic kidney disease: Secondary | ICD-10-CM | POA: Diagnosis not present

## 2022-11-03 DIAGNOSIS — N186 End stage renal disease: Secondary | ICD-10-CM | POA: Diagnosis not present

## 2022-11-03 DIAGNOSIS — E1111 Type 2 diabetes mellitus with ketoacidosis with coma: Secondary | ICD-10-CM | POA: Diagnosis not present

## 2022-11-03 DIAGNOSIS — Z4682 Encounter for fitting and adjustment of non-vascular catheter: Secondary | ICD-10-CM | POA: Diagnosis not present

## 2022-11-03 DIAGNOSIS — R579 Shock, unspecified: Secondary | ICD-10-CM | POA: Diagnosis not present

## 2022-11-03 DIAGNOSIS — J690 Pneumonitis due to inhalation of food and vomit: Secondary | ICD-10-CM | POA: Diagnosis not present

## 2022-11-03 DIAGNOSIS — I469 Cardiac arrest, cause unspecified: Secondary | ICD-10-CM | POA: Diagnosis not present

## 2022-11-03 DIAGNOSIS — Z8674 Personal history of sudden cardiac arrest: Secondary | ICD-10-CM | POA: Diagnosis not present

## 2022-11-03 DIAGNOSIS — E873 Alkalosis: Secondary | ICD-10-CM | POA: Diagnosis not present

## 2022-11-03 DIAGNOSIS — E118 Type 2 diabetes mellitus with unspecified complications: Secondary | ICD-10-CM | POA: Diagnosis not present

## 2022-11-03 DIAGNOSIS — G931 Anoxic brain damage, not elsewhere classified: Secondary | ICD-10-CM | POA: Diagnosis not present

## 2022-11-03 DIAGNOSIS — Z66 Do not resuscitate: Secondary | ICD-10-CM | POA: Diagnosis not present

## 2022-11-03 DIAGNOSIS — J189 Pneumonia, unspecified organism: Secondary | ICD-10-CM | POA: Diagnosis not present

## 2022-11-24 DEATH — deceased

## 2022-12-04 IMAGING — CT CT ABD-PELV W/O CM
2 of 3 series · 17 of 46 positions shown, 19 images · non-contrast
Comparison: None.

CLINICAL DATA: Abdominal pain and vomiting.

EXAM:
CT ABDOMEN AND PELVIS WITHOUT CONTRAST
TECHNIQUE: Multidetector CT imaging of the abdomen and pelvis was performed
following the standard protocol without IV contrast.

[Series 2: routine abd/pel wo · axial · 0.85mm/px · z∈[-482,-77]mm · 14 of 94 slices shown, 16 images]
[im 7/94  soft-tissue]
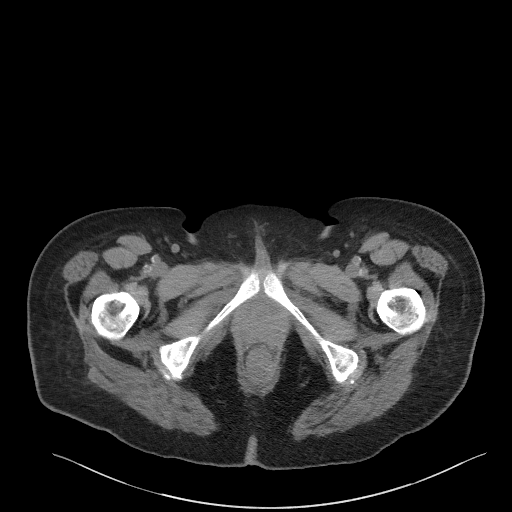
[im 7/94  bone]
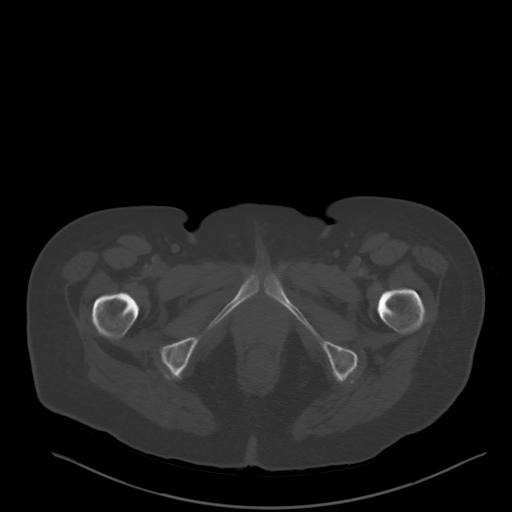
[im 13/94  soft-tissue]
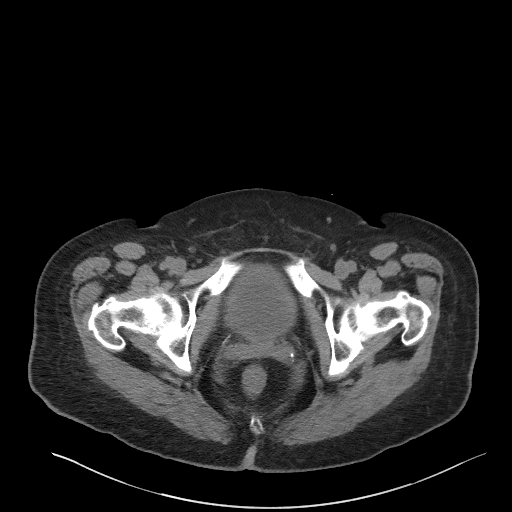
[im 19/94  soft-tissue]
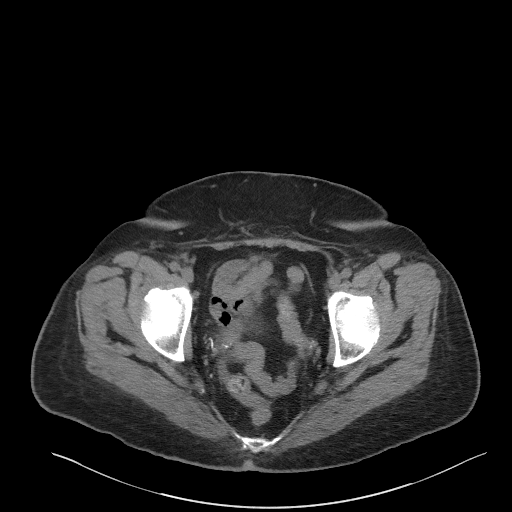
[im 25/94  soft-tissue]
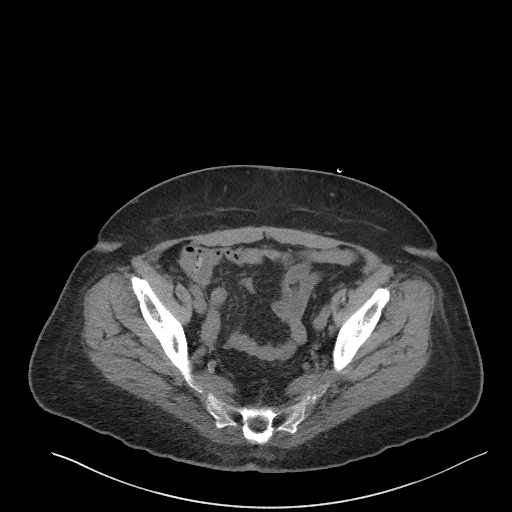
[im 31/94  soft-tissue]
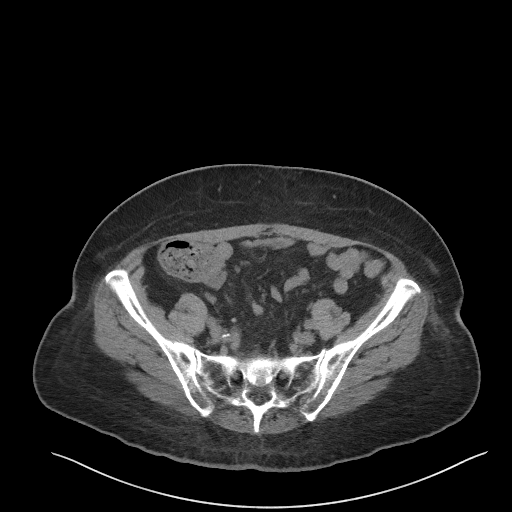
[im 37/94  soft-tissue]
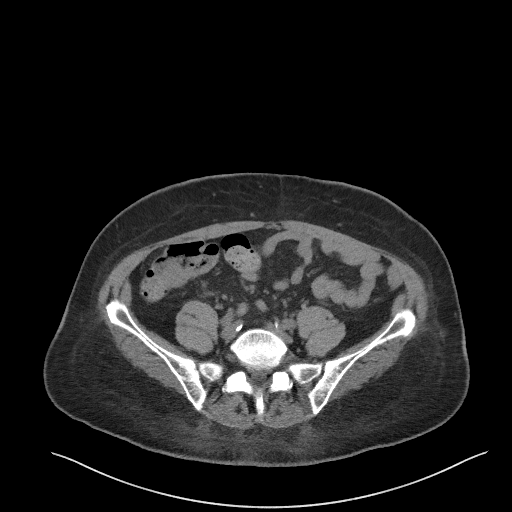
[im 43/94  soft-tissue]
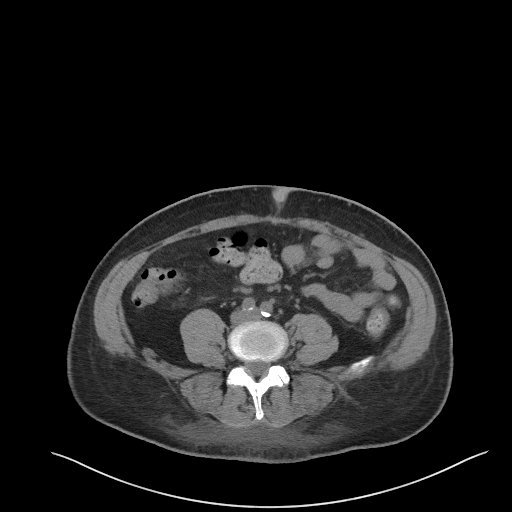
[im 52/94  soft-tissue]
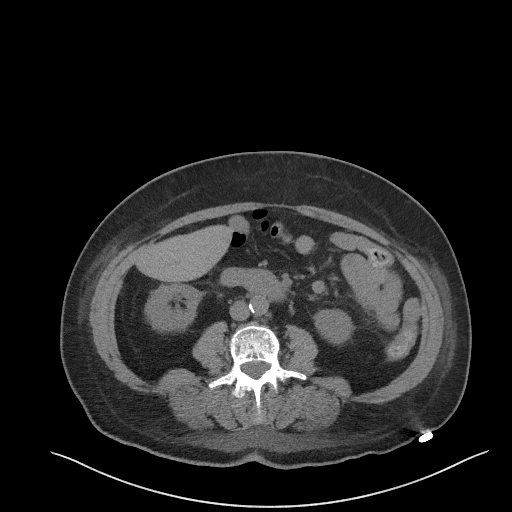
[im 58/94  soft-tissue]
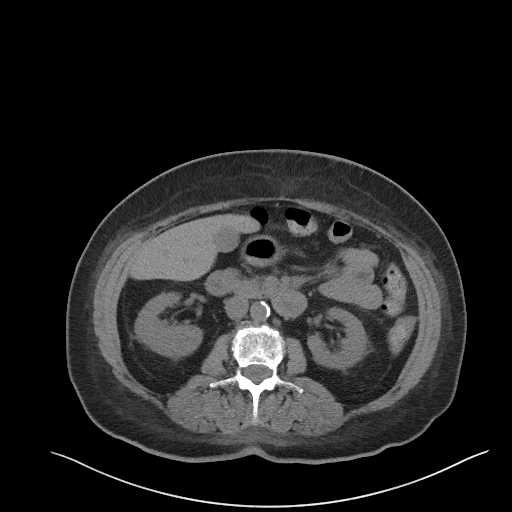
[im 58/94  bone]
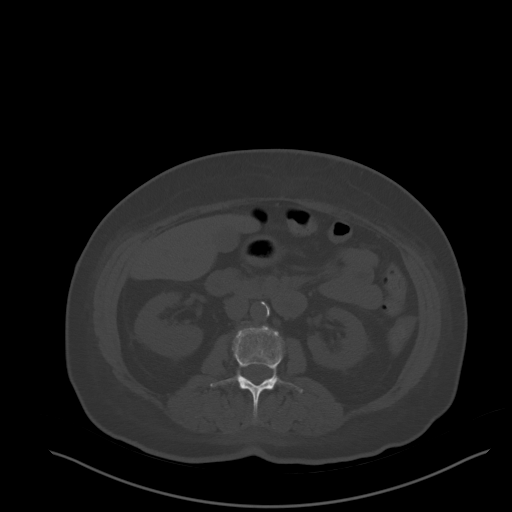
[im 64/94  soft-tissue]
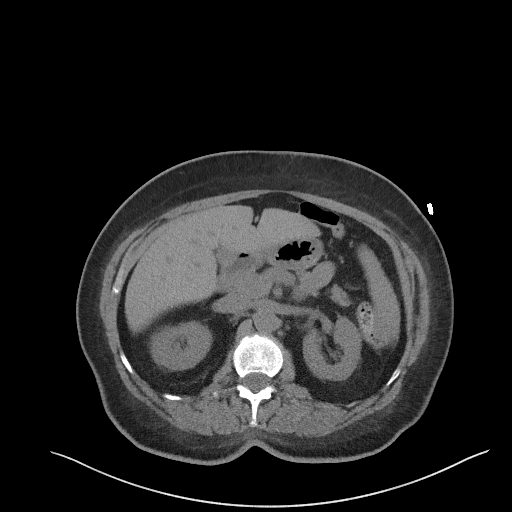
[im 70/94  soft-tissue]
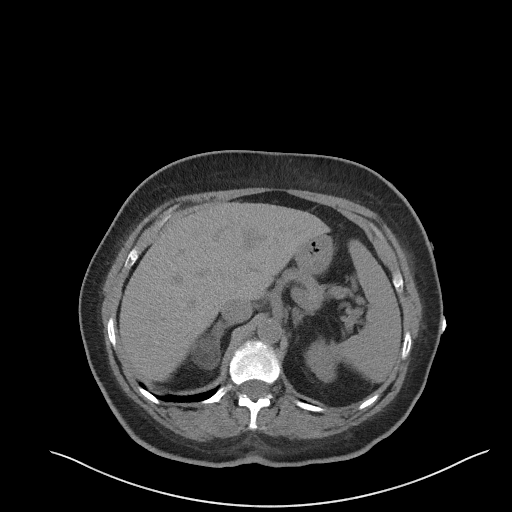
[im 76/94  soft-tissue]
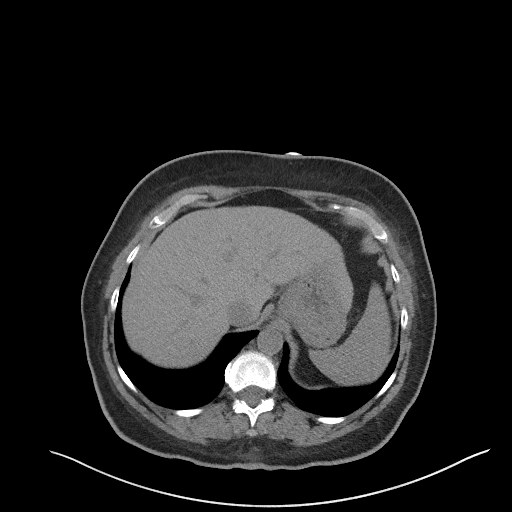
[im 82/94  soft-tissue]
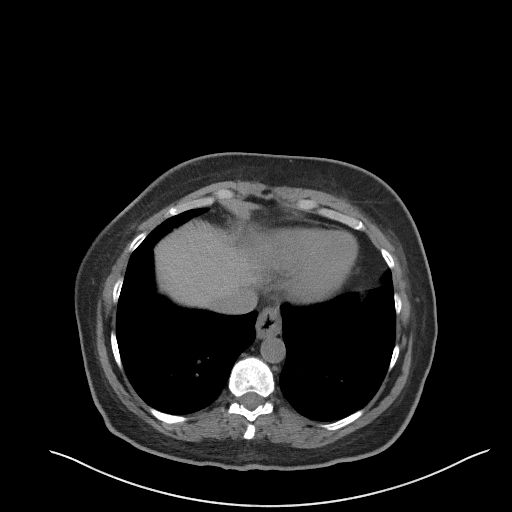
[im 88/94  soft-tissue]
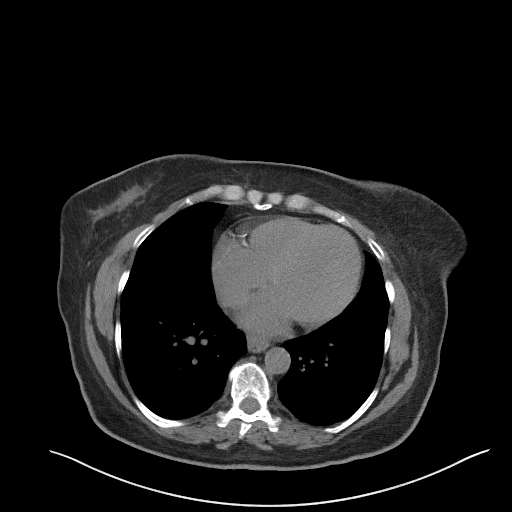

[Series 5: coronal st · coronal · 0.77mm/px · 3 of 93 slices shown]
[im 31/93  soft-tissue]
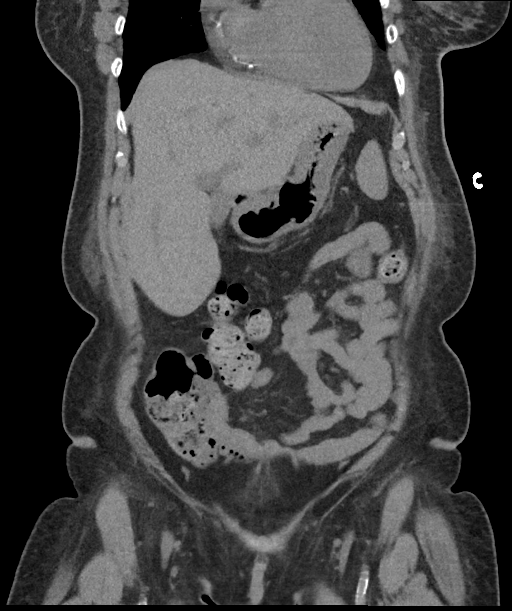
[im 41/93  soft-tissue]
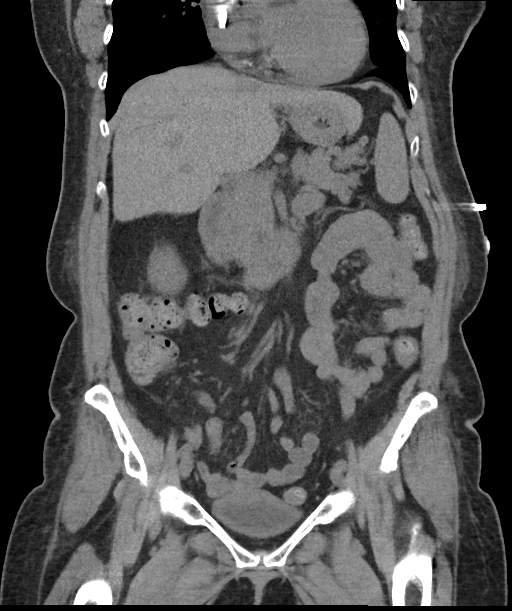
[im 52/93  soft-tissue]
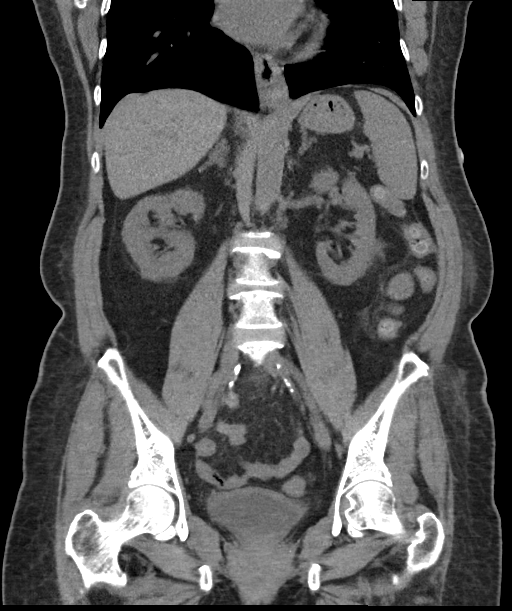

[17 of 46 positions shown; findings below may reference images not displayed]

FINDINGS: Lower chest: No acute abnormality.

Hepatobiliary: No focal liver abnormality is seen. No gallstones,
gallbladder wall thickening, or biliary dilatation.

Pancreas: Unremarkable. No pancreatic ductal dilatation or
surrounding inflammatory changes.

Spleen: Normal in size without focal abnormality.

Adrenals/Urinary Tract: The left adrenal gland is unremarkable.
cm right adrenal myelolipoma. No renal mass, calculi, or
hydronephrosis. The bladder is unremarkable for the degree of
distention.

Stomach/Bowel: Small hiatal hernia. The stomach is otherwise within
normal limits. No bowel wall thickening, distention, or surrounding
inflammatory changes. Normal appendix.

Vascular/Lymphatic: Aortic atherosclerosis. No enlarged abdominal or
pelvic lymph nodes.

Reproductive: Status post hysterectomy. No adnexal masses.

Other: No free fluid or pneumoperitoneum.

Musculoskeletal: No acute or significant osseous findings.
IMPRESSION: 1. No acute intra-abdominal process.
2. 2.7 cm right adrenal myelolipoma.
3. Aortic Atherosclerosis ([id number]).

## 2022-12-04 IMAGING — CR DG CHEST 2V
1 series · 2 of 2 positions shown · non-contrast
Comparison: None.

CLINICAL DATA: Vomiting and abdominal pain. Chest pain. Shortness
of breath.

EXAM:
CHEST - 2 VIEW

[Series 1: dg chest 2 view · 0.14mm/px · 2 of 2 slices shown]
[im 1/2]
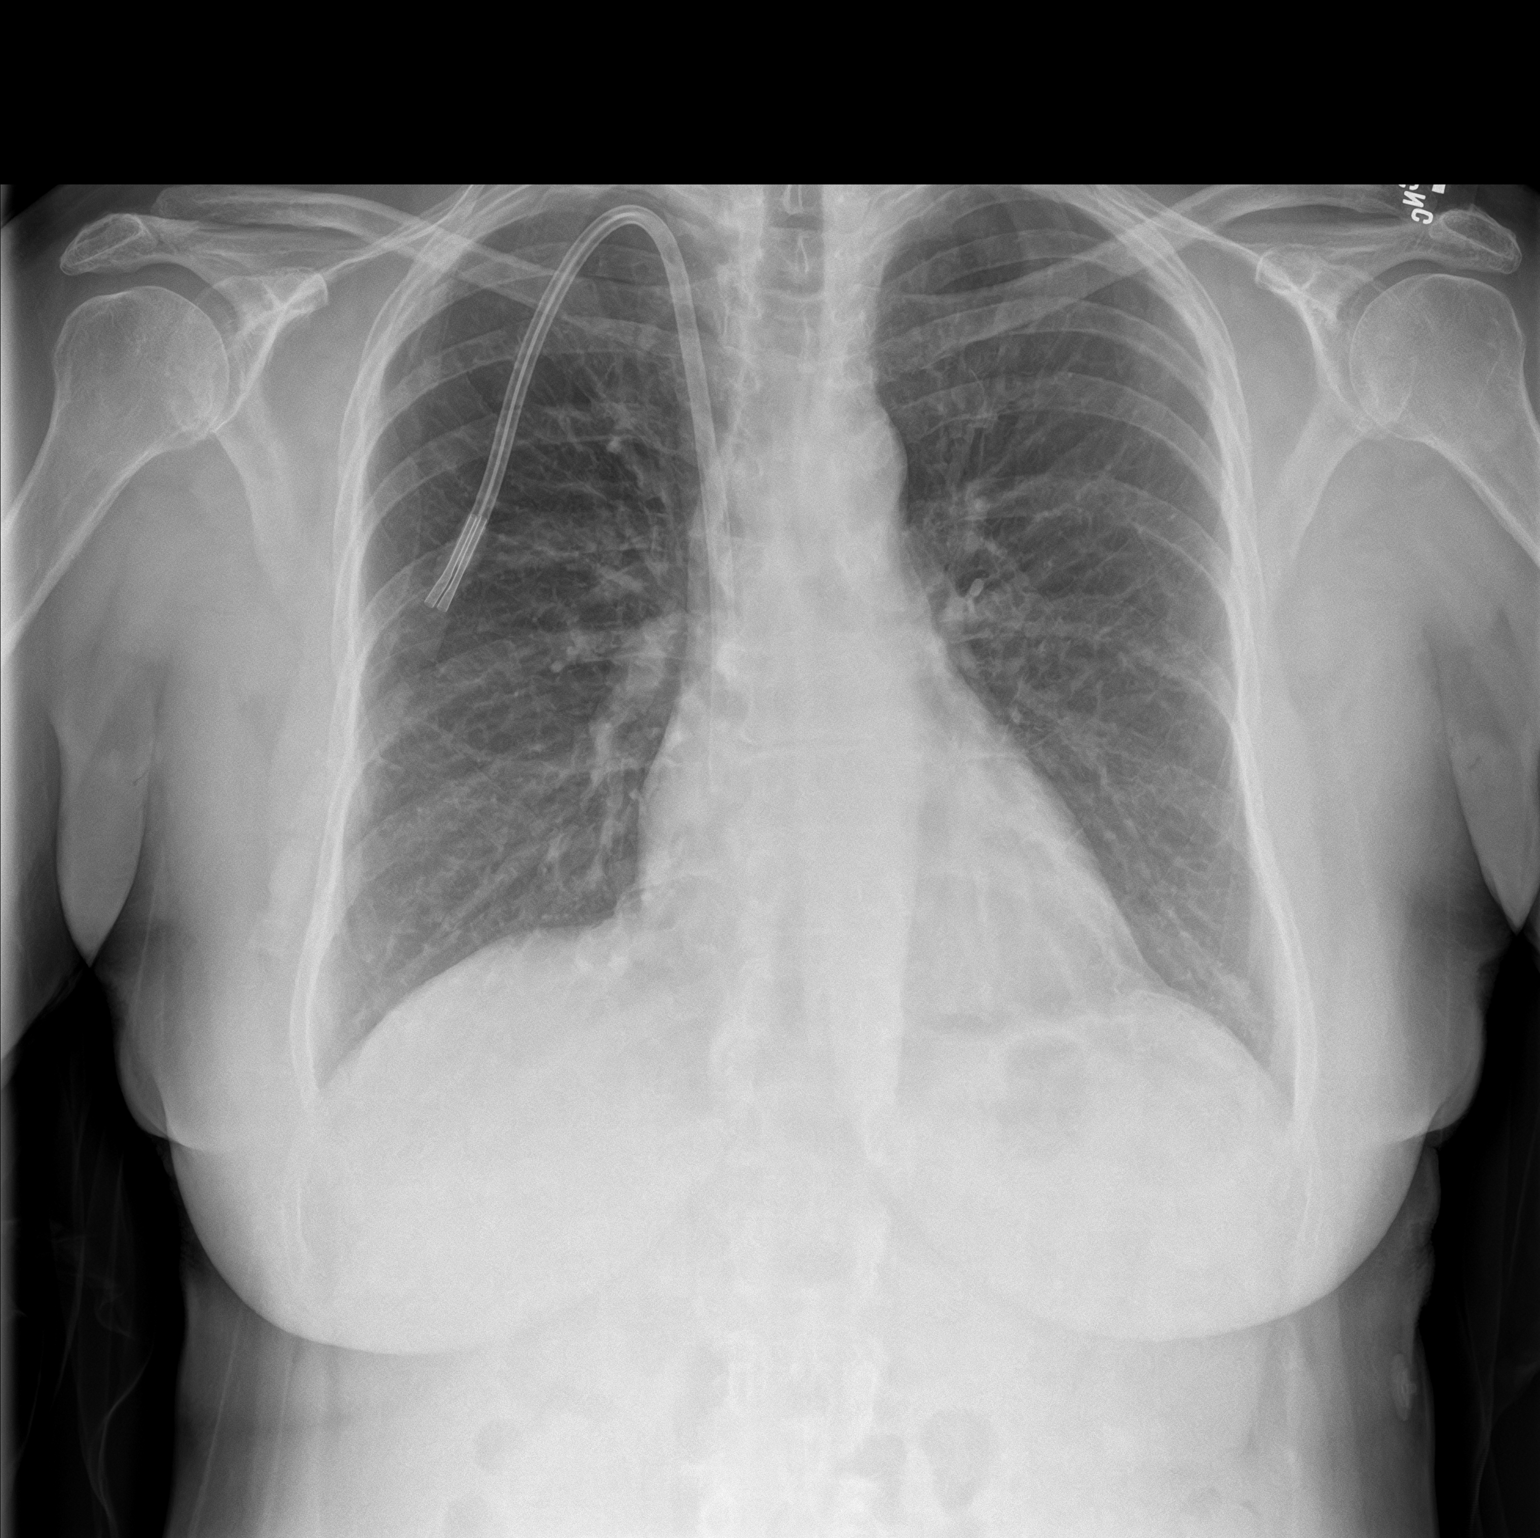
[im 2/2]
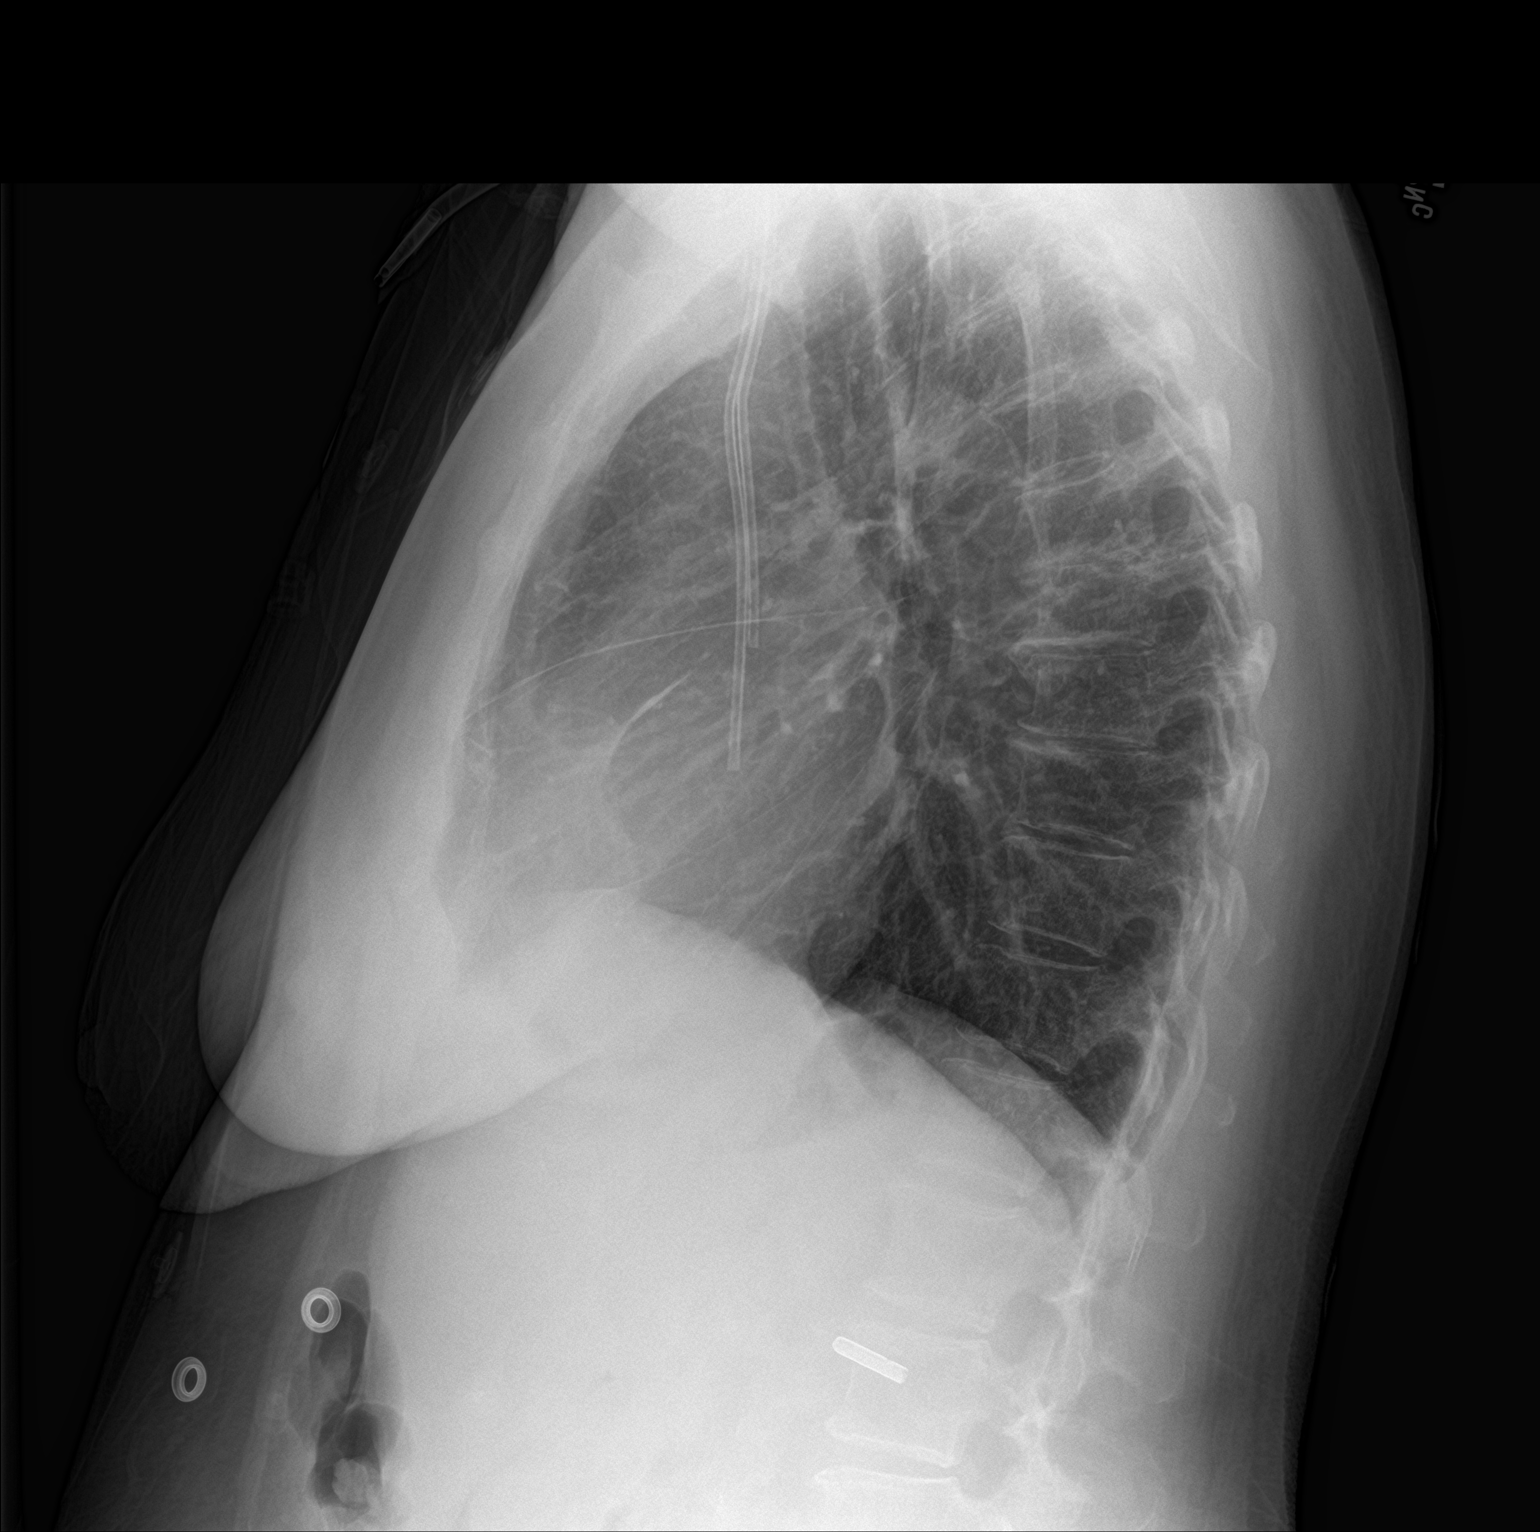

[2 of 2 positions shown; findings below may reference images not displayed]

FINDINGS: The right Vas-Cath terminates near the caval atrial junction. No
pneumothorax. The heart, hila, and mediastinum are normal. No
pulmonary nodules or masses. No focal infiltrates. No overt edema.
IMPRESSION: No active cardiopulmonary disease.

## 2023-04-28 ENCOUNTER — Ambulatory Visit: Payer: BC Managed Care – PPO | Admitting: Internal Medicine

## 2023-04-28 NOTE — Progress Notes (Deleted)
Date:  04/28/2023   Name:  Kelli [name]   DOB:  October 10, 1960   MRN:  [id number]   Chief Complaint: No chief complaint on file.  HPI  Lab Results  Component Value Date   NA 137 07/21/2022   K 4.5 07/21/2022   CO2 26 07/21/2022   GLUCOSE 194 (H) 07/21/2022   BUN 52 (H) 07/21/2022   CREATININE 7.39 (H) 07/21/2022   CALCIUM 8.8 (L) 07/21/2022   EGFR 11 12/11/2021   GFRNONAA 6 (L) 07/21/2022   Lab Results  Component Value Date   CHOL 217 (H) 10/24/2022   HDL 109 10/24/2022   LDLCALC 90 10/24/2022   TRIG 106 10/24/2022   CHOLHDL 2.0 10/24/2022   Lab Results  Component Value Date   TSH 0.960 10/24/2022   Lab Results  Component Value Date   HGBA1C 10.9 (H) 10/24/2022   Lab Results  Component Value Date   WBC 7.0 07/21/2022   HGB 9.3 (L) 07/21/2022   HCT 28.6 (L) 07/21/2022   MCV 99.0 07/21/2022   PLT 200 07/21/2022   Lab Results  Component Value Date   ALT 88 (H) 07/21/2022   AST 52 (H) 07/21/2022   ALKPHOS 119 07/21/2022   BILITOT 1.0 07/21/2022   No results found for: "25OHVITD2", "25OHVITD3", "VD25OH"   Review of Systems  Patient Active Problem List   Diagnosis Date Noted   OSA (obstructive sleep apnea) 04/17/2022   Coronary artery disease involving native coronary artery of native heart 10/07/2021   S/P drug eluting coronary stent placement 10/07/2021   ESRD (end stage renal disease) (HCC) 08/06/2021   Peptic ulcer disease 08/06/2021   Intractable cyclical vomiting with nausea 03/31/2021   Refusal of blood transfusions as patient is Jehovah's Witness 02/27/2021   Myalgia due to statin 10/12/2020   Status post hysterectomy 10/02/2020   Psychophysiological insomnia 05/30/2020   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, left eye, 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   Neovascular glaucoma of left eye, 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   Hyperlipidemia due to type 1 diabetes mellitus (HCC) 06/30/2019   Hypomagnesemia 06/30/2019   Charcot foot due to diabetes mellitus (HCC)  02/09/2017   Venous insufficiency of both lower extremities 02/09/2017   Chronic idiopathic constipation 01/26/2017   Essential hypertension 08/21/2016   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   Temporomandibular joint pain 11/02/2014   Moderate episode of recurrent major depressive disorder (HCC) 11/02/2014   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HCC) 06/28/2014   Eustachian tube dysfunction 05/10/2014   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   Vitamin D deficiency 12/30/2012   Corns and callosity 07/09/2012   Hammer toe, acquired 07/09/2012    Allergies  Allergen Reactions   Methoxy Polyethylene Glycol-Epoetin Beta Anaphylaxis   Statins Nausea And Vomiting   Albumin Human Other (See Comments)    Refuses all blood products as one of Jehovah's Witnesses    Gabapentin Swelling   Hydrochlorothiazide Nausea And Vomiting   Lisinopril Other (See Comments)    fatigue   Lyrica [Pregabalin] Swelling   Metformin And Related Nausea And Vomiting   Penicillins Nausea And Vomiting    Tolerated CEFAZOLIN on 05/30/2021 and 12/06/2021 with no documented ADRs.    Past Surgical History:  Procedure Laterality Date   ABDOMINAL HYSTERECTOMY     AV FISTULA PLACEMENT Left 03/28/2022   Procedure: CREATION OF ARTERIOVENOUS (AV) FISTULA;  Surgeon: Renford Dills, MD;  Location: [facility];  Service: Vascular;  Laterality: Left;   CAPD  INSERTION N/A 05/30/2021   Procedure: CONTINUOUS AMBULATORY PERITONEAL DIALYSIS  (CAPD) CATHETER INSERTION;  Surgeon: Leafy Ro, MD;  Location: [facility];  Service: General;  Laterality: N/A;   CAPD REMOVAL N/A 12/06/2021   Procedure: REMOVAL OF CONTINUOUS AMBULATORY PERITONEAL DIALYSIS  (CAPD) CATHETER;  Surgeon: Leafy Ro, MD;  Location: [facility];  Service: General;  Laterality: N/A;   COLONOSCOPY W/ POLYPECTOMY     CORONARY ANGIOPLASTY WITH STENT PLACEMENT Left 10/07/2021   Procedure: CORONARY ANGIOPLASTY WITH STENT PLACEMENT;  Location: [facility]; Surgeon: Isac Caddy, MD   DIALYSIS/PERMA CATHETER INSERTION N/A 05/13/2021   Procedure: DIALYSIS/PERMA CATHETER INSERTION;  Surgeon: Annice Needy, MD;  Location: [facility];  Service: Cardiovascular;  Laterality: N/A;   ESOPHAGOGASTRODUODENOSCOPY  03/02/2017   Normal   HEMORRHOID SURGERY      Social History   Tobacco Use   Smoking status: Former    Passive exposure: Never   Smokeless tobacco: Never  Vaping Use   Vaping status: Never Used  Substance Use Topics   Alcohol use: No   Drug use: No     Medication list has been reviewed and updated.  No outpatient medications have been marked as taking for the 04/28/23 encounter (Appointment) with Reubin Milan, MD.       10/24/2022   11:12 AM 07/25/2022   11:33 AM 06/03/2022    3:32 PM 04/18/2022    2:04 PM  GAD 7 : Generalized Anxiety Score  Nervous, Anxious, on Edge 0 2 0 0  Control/stop worrying 0 1 0 0  Worry too much - different things 0 1 0 0  Trouble relaxing 0 1 0 0  Restless 0 1 0 0  Easily annoyed or irritable 0 1 0 0  Afraid - awful might happen 0 0 0 0  Total GAD 7 Score 0 7 0 0  Anxiety Difficulty Not difficult at all Somewhat difficult Not difficult at all Not difficult at all       10/24/2022   11:12 AM 07/25/2022   11:33 AM 06/03/2022    3:32 PM  Depression screen PHQ 2/9  Decreased Interest 0 0 0  Down, Depressed, Hopeless 0 0 0  PHQ - 2 Score 0 0 0  Altered sleeping 0 0 0  Tired, decreased energy 0 3 0  Change in appetite 0 1 0  Feeling bad or failure about yourself  0 1 0  Trouble concentrating 0 1 0  Moving slowly or fidgety/restless 0 1 0  Suicidal thoughts 0 0 0  PHQ-9 Score 0 7 0  Difficult doing work/chores Not difficult at all Somewhat difficult Not difficult at all    BP Readings from Last 3 Encounters:  10/24/22 132/70  07/25/22 (!) 178/70  07/21/22 (!) 218/82    Physical Exam  Wt Readings from Last 3 Encounters:  10/24/22 157 lb (71.2 kg)   07/25/22 161 lb 9.6 oz (73.3 kg)  07/21/22 150 lb (68 kg)    There were no vitals taken for this visit.  Assessment and Plan:  Problem List Items Addressed This Visit   None   No follow-ups on file.    Reubin Milan, MD [facility] [place]

## 2023-04-28 NOTE — Assessment & Plan Note (Deleted)
LDL is  Lab Results  Component Value Date   LDLCALC 90 10/24/2022   Currently being treated with crestor with good compliance and no concerns.

## 2023-04-28 NOTE — Assessment & Plan Note (Deleted)
Normal exam with stable BP on Coreg, Catapres and losartan. On HD for ESRD. No concerns or side effects to current medication. No change in regimen; continue low sodium diet.

## 2023-11-04 NOTE — Progress Notes (Signed)
Error - cancelled appt.
